# Patient Record
Sex: Male | Born: 1950 | Race: Black or African American | Hispanic: No | Marital: Single | Smoking: Former smoker
Health system: Southern US, Community
[De-identification: ages and names within clinical notes are randomized; demographics above are authoritative.]

## PROBLEM LIST (undated history)

## (undated) ENCOUNTER — Emergency Department (HOSPITAL_COMMUNITY): Admission: EM | Disposition: A | Discharge status: Home / Self Care | Payer: Self-pay

## (undated) DIAGNOSIS — E785 Hyperlipidemia, unspecified: Secondary | ICD-10-CM

## (undated) DIAGNOSIS — K922 Gastrointestinal hemorrhage, unspecified: Secondary | ICD-10-CM

## (undated) DIAGNOSIS — N186 End stage renal disease: Secondary | ICD-10-CM

## (undated) DIAGNOSIS — E119 Type 2 diabetes mellitus without complications: Secondary | ICD-10-CM

## (undated) DIAGNOSIS — J90 Pleural effusion, not elsewhere classified: Secondary | ICD-10-CM

## (undated) DIAGNOSIS — I4891 Unspecified atrial fibrillation: Secondary | ICD-10-CM

## (undated) DIAGNOSIS — I5042 Chronic combined systolic (congestive) and diastolic (congestive) heart failure: Secondary | ICD-10-CM

## (undated) DIAGNOSIS — G9340 Encephalopathy, unspecified: Secondary | ICD-10-CM

## (undated) DIAGNOSIS — I639 Cerebral infarction, unspecified: Secondary | ICD-10-CM

## (undated) DIAGNOSIS — F0390 Unspecified dementia without behavioral disturbance: Secondary | ICD-10-CM

## (undated) DIAGNOSIS — I739 Peripheral vascular disease, unspecified: Secondary | ICD-10-CM

## (undated) DIAGNOSIS — I10 Essential (primary) hypertension: Secondary | ICD-10-CM

## (undated) HISTORY — PX: BELOW KNEE LEG AMPUTATION: SUR23

---

## 2016-09-04 ENCOUNTER — Emergency Department (HOSPITAL_COMMUNITY): Payer: Medicare Other

## 2016-09-04 ENCOUNTER — Inpatient Hospital Stay (HOSPITAL_COMMUNITY): Payer: Medicare Other

## 2016-09-04 ENCOUNTER — Inpatient Hospital Stay (HOSPITAL_COMMUNITY)
Admission: EM | Admit: 2016-09-04 | Discharge: 2016-09-13 | DRG: 377 | Disposition: A | Discharge status: Skilled Nursing Facility | Payer: Medicare Other | Attending: Internal Medicine | Admitting: Internal Medicine

## 2016-09-04 ENCOUNTER — Encounter (HOSPITAL_COMMUNITY): Payer: Self-pay | Admitting: *Deleted

## 2016-09-04 DIAGNOSIS — K5730 Diverticulosis of large intestine without perforation or abscess without bleeding: Secondary | ICD-10-CM | POA: Diagnosis not present

## 2016-09-04 DIAGNOSIS — L97919 Non-pressure chronic ulcer of unspecified part of right lower leg with unspecified severity: Secondary | ICD-10-CM | POA: Diagnosis present

## 2016-09-04 DIAGNOSIS — I214 Non-ST elevation (NSTEMI) myocardial infarction: Secondary | ICD-10-CM | POA: Diagnosis present

## 2016-09-04 DIAGNOSIS — Z89512 Acquired absence of left leg below knee: Secondary | ICD-10-CM | POA: Diagnosis not present

## 2016-09-04 DIAGNOSIS — H47612 Cortical blindness, left side of brain: Secondary | ICD-10-CM | POA: Diagnosis present

## 2016-09-04 DIAGNOSIS — K7460 Unspecified cirrhosis of liver: Secondary | ICD-10-CM | POA: Diagnosis not present

## 2016-09-04 DIAGNOSIS — Z4659 Encounter for fitting and adjustment of other gastrointestinal appliance and device: Secondary | ICD-10-CM | POA: Diagnosis not present

## 2016-09-04 DIAGNOSIS — H547 Unspecified visual loss: Secondary | ICD-10-CM | POA: Diagnosis not present

## 2016-09-04 DIAGNOSIS — K921 Melena: Secondary | ICD-10-CM | POA: Diagnosis not present

## 2016-09-04 DIAGNOSIS — E11622 Type 2 diabetes mellitus with other skin ulcer: Secondary | ICD-10-CM | POA: Diagnosis present

## 2016-09-04 DIAGNOSIS — J9690 Respiratory failure, unspecified, unspecified whether with hypoxia or hypercapnia: Secondary | ICD-10-CM | POA: Diagnosis present

## 2016-09-04 DIAGNOSIS — J90 Pleural effusion, not elsewhere classified: Secondary | ICD-10-CM | POA: Diagnosis not present

## 2016-09-04 DIAGNOSIS — R578 Other shock: Secondary | ICD-10-CM | POA: Diagnosis present

## 2016-09-04 DIAGNOSIS — D329 Benign neoplasm of meninges, unspecified: Secondary | ICD-10-CM | POA: Diagnosis not present

## 2016-09-04 DIAGNOSIS — D62 Acute posthemorrhagic anemia: Secondary | ICD-10-CM | POA: Diagnosis present

## 2016-09-04 DIAGNOSIS — Z79899 Other long term (current) drug therapy: Secondary | ICD-10-CM

## 2016-09-04 DIAGNOSIS — Z794 Long term (current) use of insulin: Secondary | ICD-10-CM

## 2016-09-04 DIAGNOSIS — K7469 Other cirrhosis of liver: Secondary | ICD-10-CM | POA: Diagnosis not present

## 2016-09-04 DIAGNOSIS — E1122 Type 2 diabetes mellitus with diabetic chronic kidney disease: Secondary | ICD-10-CM | POA: Diagnosis present

## 2016-09-04 DIAGNOSIS — I5042 Chronic combined systolic (congestive) and diastolic (congestive) heart failure: Secondary | ICD-10-CM | POA: Diagnosis present

## 2016-09-04 DIAGNOSIS — I4891 Unspecified atrial fibrillation: Secondary | ICD-10-CM | POA: Diagnosis not present

## 2016-09-04 DIAGNOSIS — J811 Chronic pulmonary edema: Secondary | ICD-10-CM

## 2016-09-04 DIAGNOSIS — I83009 Varicose veins of unspecified lower extremity with ulcer of unspecified site: Secondary | ICD-10-CM | POA: Diagnosis present

## 2016-09-04 DIAGNOSIS — F0150 Vascular dementia without behavioral disturbance: Secondary | ICD-10-CM | POA: Diagnosis present

## 2016-09-04 DIAGNOSIS — I132 Hypertensive heart and chronic kidney disease with heart failure and with stage 5 chronic kidney disease, or end stage renal disease: Secondary | ICD-10-CM | POA: Diagnosis present

## 2016-09-04 DIAGNOSIS — E875 Hyperkalemia: Secondary | ICD-10-CM | POA: Diagnosis present

## 2016-09-04 DIAGNOSIS — N186 End stage renal disease: Secondary | ICD-10-CM | POA: Diagnosis present

## 2016-09-04 DIAGNOSIS — I5022 Chronic systolic (congestive) heart failure: Secondary | ICD-10-CM | POA: Diagnosis not present

## 2016-09-04 DIAGNOSIS — L8992 Pressure ulcer of unspecified site, stage 2: Secondary | ICD-10-CM | POA: Diagnosis present

## 2016-09-04 DIAGNOSIS — I639 Cerebral infarction, unspecified: Secondary | ICD-10-CM

## 2016-09-04 DIAGNOSIS — M898X9 Other specified disorders of bone, unspecified site: Secondary | ICD-10-CM | POA: Diagnosis present

## 2016-09-04 DIAGNOSIS — I482 Chronic atrial fibrillation: Secondary | ICD-10-CM | POA: Diagnosis present

## 2016-09-04 DIAGNOSIS — Z992 Dependence on renal dialysis: Secondary | ICD-10-CM | POA: Diagnosis not present

## 2016-09-04 DIAGNOSIS — Z6823 Body mass index (BMI) 23.0-23.9, adult: Secondary | ICD-10-CM | POA: Diagnosis not present

## 2016-09-04 DIAGNOSIS — Z7901 Long term (current) use of anticoagulants: Secondary | ICD-10-CM

## 2016-09-04 DIAGNOSIS — K5791 Diverticulosis of intestine, part unspecified, without perforation or abscess with bleeding: Secondary | ICD-10-CM | POA: Diagnosis present

## 2016-09-04 DIAGNOSIS — K922 Gastrointestinal hemorrhage, unspecified: Secondary | ICD-10-CM | POA: Diagnosis present

## 2016-09-04 DIAGNOSIS — R710 Precipitous drop in hematocrit: Secondary | ICD-10-CM | POA: Diagnosis not present

## 2016-09-04 DIAGNOSIS — K625 Hemorrhage of anus and rectum: Secondary | ICD-10-CM | POA: Diagnosis not present

## 2016-09-04 DIAGNOSIS — H47611 Cortical blindness, right side of brain: Secondary | ICD-10-CM | POA: Diagnosis present

## 2016-09-04 DIAGNOSIS — E1151 Type 2 diabetes mellitus with diabetic peripheral angiopathy without gangrene: Secondary | ICD-10-CM | POA: Diagnosis present

## 2016-09-04 DIAGNOSIS — G9340 Encephalopathy, unspecified: Secondary | ICD-10-CM | POA: Diagnosis present

## 2016-09-04 DIAGNOSIS — I259 Chronic ischemic heart disease, unspecified: Secondary | ICD-10-CM | POA: Diagnosis present

## 2016-09-04 DIAGNOSIS — I340 Nonrheumatic mitral (valve) insufficiency: Secondary | ICD-10-CM | POA: Diagnosis not present

## 2016-09-04 DIAGNOSIS — Z7982 Long term (current) use of aspirin: Secondary | ICD-10-CM

## 2016-09-04 DIAGNOSIS — Z09 Encounter for follow-up examination after completed treatment for conditions other than malignant neoplasm: Secondary | ICD-10-CM

## 2016-09-04 DIAGNOSIS — E440 Moderate protein-calorie malnutrition: Secondary | ICD-10-CM | POA: Insufficient documentation

## 2016-09-04 DIAGNOSIS — E1165 Type 2 diabetes mellitus with hyperglycemia: Secondary | ICD-10-CM | POA: Diagnosis present

## 2016-09-04 DIAGNOSIS — D631 Anemia in chronic kidney disease: Secondary | ICD-10-CM | POA: Diagnosis present

## 2016-09-04 DIAGNOSIS — I63119 Cerebral infarction due to embolism of unspecified vertebral artery: Secondary | ICD-10-CM | POA: Diagnosis not present

## 2016-09-04 DIAGNOSIS — D696 Thrombocytopenia, unspecified: Secondary | ICD-10-CM | POA: Diagnosis present

## 2016-09-04 DIAGNOSIS — I248 Other forms of acute ischemic heart disease: Secondary | ICD-10-CM | POA: Diagnosis not present

## 2016-09-04 HISTORY — DX: Pleural effusion, not elsewhere classified: J90

## 2016-09-04 HISTORY — DX: Gastrointestinal hemorrhage, unspecified: K92.2

## 2016-09-04 HISTORY — DX: End stage renal disease: N18.6

## 2016-09-04 HISTORY — DX: Unspecified dementia, unspecified severity, without behavioral disturbance, psychotic disturbance, mood disturbance, and anxiety: F03.90

## 2016-09-04 HISTORY — DX: Cerebral infarction, unspecified: I63.9

## 2016-09-04 HISTORY — DX: Type 2 diabetes mellitus without complications: E11.9

## 2016-09-04 HISTORY — DX: Chronic combined systolic (congestive) and diastolic (congestive) heart failure: I50.42

## 2016-09-04 HISTORY — DX: Encephalopathy, unspecified: G93.40

## 2016-09-04 HISTORY — DX: Essential (primary) hypertension: I10

## 2016-09-04 HISTORY — DX: Peripheral vascular disease, unspecified: I73.9

## 2016-09-04 HISTORY — DX: Unspecified atrial fibrillation: I48.91

## 2016-09-04 LAB — I-STAT TROPONIN, ED: TROPONIN I, POC: 0.04 ng/mL (ref 0.00–0.08)

## 2016-09-04 LAB — CBC
HCT: 22 % — ABNORMAL LOW (ref 39.0–52.0)
HEMATOCRIT: 20.2 % — AB (ref 39.0–52.0)
HEMATOCRIT: 20.9 % — AB (ref 39.0–52.0)
HEMOGLOBIN: 7.2 g/dL — AB (ref 13.0–17.0)
HEMOGLOBIN: 7.5 g/dL — AB (ref 13.0–17.0)
Hemoglobin: 7.4 g/dL — ABNORMAL LOW (ref 13.0–17.0)
MCH: 29.1 pg (ref 26.0–34.0)
MCH: 30.3 pg (ref 26.0–34.0)
MCH: 30.2 pg (ref 26.0–34.0)
MCHC: 33.6 g/dL (ref 30.0–36.0)
MCHC: 35.6 g/dL (ref 30.0–36.0)
MCHC: 35.9 g/dL (ref 30.0–36.0)
MCV: 86.6 fL (ref 78.0–100.0)
MCV: 84.9 fL (ref 78.0–100.0)
MCV: 84.3 fL (ref 78.0–100.0)
PLATELETS: 155 10*3/uL (ref 150–400)
PLATELETS: 120 10*3/uL — AB (ref 150–400)
Platelets: 146 10*3/uL — ABNORMAL LOW (ref 150–400)
RBC: 2.54 MIL/uL — AB (ref 4.22–5.81)
RBC: 2.38 MIL/uL — AB (ref 4.22–5.81)
RBC: 2.48 MIL/uL — AB (ref 4.22–5.81)
RDW: 15.4 % (ref 11.5–15.5)
RDW: 15.9 % — ABNORMAL HIGH (ref 11.5–15.5)
RDW: 15.3 % (ref 11.5–15.5)
WBC: 20.4 10*3/uL — AB (ref 4.0–10.5)
WBC: 27.2 10*3/uL — AB (ref 4.0–10.5)
WBC: 18.9 10*3/uL — AB (ref 4.0–10.5)

## 2016-09-04 LAB — CBC WITH DIFFERENTIAL/PLATELET
BASOS PCT: 0 %
Basophils Absolute: 0 10*3/uL (ref 0.0–0.1)
EOS ABS: 0.4 10*3/uL (ref 0.0–0.7)
EOS PCT: 4 %
HCT: 18.7 % — ABNORMAL LOW (ref 39.0–52.0)
Hemoglobin: 6.3 g/dL — CL (ref 13.0–17.0)
LYMPHS ABS: 1.7 10*3/uL (ref 0.7–4.0)
Lymphocytes Relative: 14 %
MCH: 29.6 pg (ref 26.0–34.0)
MCHC: 33.7 g/dL (ref 30.0–36.0)
MCV: 87.8 fL (ref 78.0–100.0)
Monocytes Absolute: 0.8 10*3/uL (ref 0.1–1.0)
Monocytes Relative: 7 %
NEUTROS PCT: 76 %
Neutro Abs: 9 10*3/uL — ABNORMAL HIGH (ref 1.7–7.7)
PLATELETS: 210 10*3/uL (ref 150–400)
RBC: 2.13 MIL/uL — AB (ref 4.22–5.81)
RDW: 16.2 % — ABNORMAL HIGH (ref 11.5–15.5)
WBC: 11.8 10*3/uL — AB (ref 4.0–10.5)

## 2016-09-04 LAB — COMPREHENSIVE METABOLIC PANEL
ALBUMIN: 2.5 g/dL — AB (ref 3.5–5.0)
ALT: 18 U/L (ref 17–63)
AST: 22 U/L (ref 15–41)
Alkaline Phosphatase: 66 U/L (ref 38–126)
Anion gap: 10 (ref 5–15)
BUN: 28 mg/dL — ABNORMAL HIGH (ref 6–20)
CO2: 23 mmol/L (ref 22–32)
Calcium: 7.7 mg/dL — ABNORMAL LOW (ref 8.9–10.3)
Chloride: 105 mmol/L (ref 101–111)
Creatinine, Ser: 2.99 mg/dL — ABNORMAL HIGH (ref 0.61–1.24)
GFR calc non Af Amer: 20 mL/min — ABNORMAL LOW (ref >60)
GFR, EST AFRICAN AMERICAN: 24 mL/min — AB (ref >60)
GLUCOSE: 185 mg/dL — AB (ref 65–99)
Potassium: 3.9 mmol/L (ref 3.5–5.1)
SODIUM: 138 mmol/L (ref 135–145)
Total Bilirubin: 0.4 mg/dL (ref 0.3–1.2)
Total Protein: 4.7 g/dL — ABNORMAL LOW (ref 6.5–8.1)

## 2016-09-04 LAB — TROPONIN I
TROPONIN I: 0.04 ng/mL — AB (ref <0.03)
TROPONIN I: 0.65 ng/mL — AB (ref <0.03)
Troponin I: 0.04 ng/mL (ref <0.03)
Troponin I: 0.04 ng/mL (ref <0.03)

## 2016-09-04 LAB — BLOOD GAS, ARTERIAL
ACID-BASE DEFICIT: 4.9 mmol/L — AB (ref 0.0–2.0)
BICARBONATE: 19 mmol/L — AB (ref 20.0–28.0)
Drawn by: 511471
FIO2: 0.21
O2 SAT: 97.1 %
PATIENT TEMPERATURE: 97.9
PCO2 ART: 31.8 mmHg — AB (ref 32.0–48.0)
PO2 ART: 86 mmHg (ref 83.0–108.0)
pH, Arterial: 7.392 (ref 7.350–7.450)

## 2016-09-04 LAB — BASIC METABOLIC PANEL
ANION GAP: 10 (ref 5–15)
BUN: 28 mg/dL — ABNORMAL HIGH (ref 6–20)
CHLORIDE: 109 mmol/L (ref 101–111)
CO2: 20 mmol/L — AB (ref 22–32)
Calcium: 7.2 mg/dL — ABNORMAL LOW (ref 8.9–10.3)
Creatinine, Ser: 2.96 mg/dL — ABNORMAL HIGH (ref 0.61–1.24)
GFR calc non Af Amer: 21 mL/min — ABNORMAL LOW (ref >60)
GFR, EST AFRICAN AMERICAN: 24 mL/min — AB (ref >60)
GLUCOSE: 183 mg/dL — AB (ref 65–99)
Potassium: 4.1 mmol/L (ref 3.5–5.1)
Sodium: 139 mmol/L (ref 135–145)

## 2016-09-04 LAB — I-STAT CG4 LACTIC ACID, ED: LACTIC ACID, VENOUS: 2.38 mmol/L — AB (ref 0.5–1.9)

## 2016-09-04 LAB — PROTIME-INR
INR: 1.61
INR: 1.03
Prothrombin Time: 19.3 seconds — ABNORMAL HIGH (ref 11.4–15.2)
Prothrombin Time: 13.5 seconds (ref 11.4–15.2)

## 2016-09-04 LAB — ABO/RH: ABO/RH(D): B POS

## 2016-09-04 LAB — CBG MONITORING, ED: GLUCOSE-CAPILLARY: 148 mg/dL — AB (ref 65–99)

## 2016-09-04 LAB — GLUCOSE, CAPILLARY
GLUCOSE-CAPILLARY: 145 mg/dL — AB (ref 65–99)
GLUCOSE-CAPILLARY: 105 mg/dL — AB (ref 65–99)
GLUCOSE-CAPILLARY: 114 mg/dL — AB (ref 65–99)
Glucose-Capillary: 126 mg/dL — ABNORMAL HIGH (ref 65–99)

## 2016-09-04 LAB — MRSA PCR SCREENING: MRSA by PCR: NEGATIVE

## 2016-09-04 LAB — PREPARE RBC (CROSSMATCH)

## 2016-09-04 LAB — MAGNESIUM: Magnesium: 1.5 mg/dL — ABNORMAL LOW (ref 1.7–2.4)

## 2016-09-04 LAB — PHOSPHORUS: PHOSPHORUS: 3 mg/dL (ref 2.5–4.6)

## 2016-09-04 MED ORDER — INSULIN ASPART 100 UNIT/ML ~~LOC~~ SOLN
0.0000 [IU] | SUBCUTANEOUS | Status: DC
  Administered 2016-09-04 – 2016-09-08 (×4): 1 [IU] via SUBCUTANEOUS

## 2016-09-04 MED ORDER — PHENYLEPHRINE HCL-NACL 10-0.9 MG/250ML-% IV SOLN
0.0000 ug/min | INTRAVENOUS | Status: DC
  Administered 2016-09-04: 50 ug/min via INTRAVENOUS
  Administered 2016-09-04: 15 ug/min via INTRAVENOUS
  Administered 2016-09-04: 30 ug/min via INTRAVENOUS
  Filled 2016-09-04 (×3): qty 250

## 2016-09-04 MED ORDER — PROTHROMBIN COMPLEX CONC HUMAN 500 UNITS IV KIT
3937.0000 [IU] | PACK | INTRAVENOUS | Status: AC
  Administered 2016-09-04: 3937 [IU] via INTRAVENOUS
  Filled 2016-09-04: qty 157.48

## 2016-09-04 MED ORDER — SODIUM CHLORIDE 0.9 % IV BOLUS (SEPSIS)
500.0000 mL | Freq: Once | INTRAVENOUS | Status: AC
  Administered 2016-09-04: 500 mL via INTRAVENOUS

## 2016-09-04 MED ORDER — SODIUM PERTECHNETATE TC 99M INJECTION: 23.8000 | Freq: Once | INTRAVENOUS | Status: DC | PRN

## 2016-09-04 MED ORDER — IOPAMIDOL (ISOVUE-370) INJECTION 76%
100.0000 mL | Freq: Once | INTRAVENOUS | Status: AC | PRN
  Administered 2016-09-04: 100 mL via INTRAVENOUS

## 2016-09-04 MED ORDER — HEPARIN SOD (PORK) LOCK FLUSH 100 UNIT/ML IV SOLN
500.0000 [IU] | Freq: Once | INTRAVENOUS | Status: DC
  Filled 2016-09-04: qty 5

## 2016-09-04 MED ORDER — SODIUM CHLORIDE 0.9 % IV SOLN
8.0000 mg/h | INTRAVENOUS | Status: DC
  Administered 2016-09-04 – 2016-09-05 (×3): 8 mg/h via INTRAVENOUS
  Filled 2016-09-04 (×5): qty 80

## 2016-09-04 MED ORDER — SODIUM CHLORIDE 0.9 % IV SOLN: Freq: Once | INTRAVENOUS | Status: DC

## 2016-09-04 MED ORDER — SODIUM CHLORIDE 0.9 % IV SOLN
0.4000 ug/kg | Freq: Once | INTRAVENOUS | Status: AC
  Administered 2016-09-04: 29.2 ug via INTRAVENOUS
  Filled 2016-09-04: qty 7.3

## 2016-09-04 MED ORDER — FEIBA NF IV SOLR
25.0000 [IU]/kg | Status: AC
  Administered 2016-09-04: 1833 [IU] via INTRAVENOUS
  Filled 2016-09-04: qty 1833

## 2016-09-04 MED ORDER — PANTOPRAZOLE SODIUM 40 MG IV SOLR: 40.0000 mg | Freq: Two times a day (BID) | INTRAVENOUS | Status: DC

## 2016-09-04 MED ORDER — SODIUM CHLORIDE 0.9 % IV SOLN
80.0000 mg | Freq: Once | INTRAVENOUS | Status: AC
  Administered 2016-09-04: 09:00:00 80 mg via INTRAVENOUS
  Filled 2016-09-04: qty 80

## 2016-09-04 MED ORDER — SODIUM CHLORIDE 0.9 % IV SOLN
Freq: Once | INTRAVENOUS | Status: AC
  Administered 2016-09-04: 15:00:00 via INTRAVENOUS

## 2016-09-04 MED ORDER — SODIUM PERTECHNETATE TC 99M INJECTION
22.0000 | Freq: Once | INTRAVENOUS | Status: AC | PRN
  Administered 2016-09-04: 22 via INTRAVENOUS

## 2016-09-04 NOTE — Progress Notes (Signed)
/  [place] Progress Note Patient Name: Deakin Lacek DOB: 07-Nov-1950 MRN: [id number]   Date of Service  09/04/2016  HPI/Events of Note  BG on room air = 7.39/31.8/86.0  eICU Interventions  Continue present management.      Intervention Category Major Interventions: Change in mental status - evaluation and management  Sommer,Steven Eugene 09/04/2016, 9:51 PM

## 2016-09-04 NOTE — Consult Note (Signed)
Chief Complaint: Patient was seen in consultation today for GI bleed  Referring Physician(s):  Dr. Owens Loffler  Supervising Physician: Jacqulynn Cadet  Patient Status: [facility] - In-pt  History of Present Illness: George Burgess is a 66 y.o. male with past medical history of chronic a fib, prior CVA, vascular dementa, HTN, DM2, PVD s/p left BKA, chronic LE wounds, and ESRD on HD who presented to [facility] ED after passing dark red stools at nursing home.  Patient was found to have hypotension and HgB of 6.3, now s/p 3u PRBCs.  He has continued with hypotension and only had a 1 point increase in hemoglobin after transfusions.   IR consulted for possible angiogram and embolization.  Patient undergoing CTA Abdomen.   Dr. Laurence Ferrari aware of case.   No past medical history on file.  No past surgical history on file.  Allergies: Patient has no known allergies.  Medications: Prior to Admission medications   Medication Sig Start Date End Date Taking? Authorizing Provider  acetaminophen (TYLENOL) 325 MG tablet Take 650 mg by mouth every 4 (four) hours as needed for moderate pain.   Yes [provider]  apixaban (ELIQUIS) 2.5 MG TABS tablet Take 2.5 mg by mouth 2 (two) times daily.   Yes [provider]  ascorbic acid (VITAMIN C) 500 MG tablet Take 500 mg by mouth daily.   Yes [provider]  aspirin 81 MG chewable tablet Chew 81 mg by mouth daily.   Yes [provider]  famotidine (PEPCID) 20 MG tablet Take 20 mg by mouth daily.   Yes [provider]  ferrous sulfate 325 (65 FE) MG tablet Take 325 mg by mouth 2 (two) times daily with a meal.   Yes [provider]  furosemide (LASIX) 40 MG tablet Take 40 mg by mouth daily.   Yes [provider]  insulin lispro (HUMALOG) 100 UNIT/ML injection Inject 0-10 Units into the skin 3 (three) times daily before meals. 70-150 units=0 units,  151-200=200 units,  201-250=4 units,  251-300=6  units, 301-350= 8 units, 351 += 10 units, 400 or higher CALL MD   Yes [provider]  isosorbide mononitrate (IMDUR) 30 MG 24 hr tablet Take 30 mg by mouth daily.   Yes [provider]  lisinopril (PRINIVIL,ZESTRIL) 10 MG tablet Take 10 mg by mouth daily.   Yes [provider]  metoprolol tartrate (LOPRESSOR) 50 MG tablet Take 50 mg by mouth 2 (two) times daily.   Yes [provider]  nitroGLYCERIN (NITROSTAT) 0.4 MG SL tablet Place 0.4 mg under the tongue every 5 (five) minutes as needed for chest pain.   Yes [provider]  Nutritional Supplements (FEEDING SUPPLEMENT, NEPRO CARB STEADY,) LIQD Take 237 mLs by mouth daily.   Yes [provider]  pantoprazole (PROTONIX) 40 MG tablet Take 40 mg by mouth daily.   Yes [provider]     No family history on file.  Social History   Social History  . Marital status: Single    Spouse name: N/A  . Number of children: N/A  . Years of education: N/A   Social History Main Topics  . Smoking status: Not on file  . Smokeless tobacco: Not on file  . Alcohol use Not on file  . Drug use: Unknown  . Sexual activity: Not on file   Other Topics Concern  . Not on file   Social History Narrative  . No narrative on file  Review of Systems  Unable to perform ROS: Dementia    Vital Signs: BP (!) 84/39   Pulse 63   Temp (!) 96.5 F (35.8 C) (Rectal)   Resp (!) 26   Ht 6' (1.829 m)   Wt 161 lb 9.6 oz (73.3 kg)   SpO2 99%   BMI 21.92 kg/m   Physical Exam  Constitutional: He appears well-developed.  Cardiovascular: Normal rate, regular rhythm and normal heart sounds.   Pulmonary/Chest: Effort normal. No respiratory distress.  Coarse breath sounds throughout  Abdominal: Soft.  Continuous dark red stools  Neurological: He is alert.  Skin: Skin is dry.  Under warming blankets  Nursing note and vitals reviewed.   Mallampati Score:  MD Evaluation Airway: WNL Heart:  WNL Abdomen: WNL Chest/ Lungs: WNL ASA  Classification: 3 Mallampati/Airway Score: Two  Imaging: Dg Chest Port 1 View  Result Date: 09/04/2016 CLINICAL DATA:  GI bleed.  Possible sepsis . EXAM: PORTABLE CHEST 1 VIEW COMPARISON:  No recent prior . FINDINGS: Right IJ dual-lumen catheter with tip projected over the right atrium. Cardiomegaly with bilateral pulmonary infiltrates consistent with pulmonary edema, right side greater than. Small right pleural effusion. IMPRESSION: 1. Right IJ dual-lumen catheter with tip projected over right atrium. 2. Congestive heart failure with pulmonary edema, right side greater than left. Basilar pneumonia cannot be excluded. Right-sided pleural effusion. Electronically Signed   By: Marcello Moores  Register   On: 09/04/2016 07:32    Labs:  CBC:  Recent Labs  09/04/16 0627 09/04/16 0825  WBC 11.8* 20.4*  HGB 6.3* 7.4*  HCT 18.7* 22.0*  PLT 210 155    COAGS:  Recent Labs  09/04/16 0627  INR 1.61    BMP:  Recent Labs  09/04/16 0627 09/04/16 1127  NA 138 139  K 3.9 4.1  CL 105 109  CO2 23 20*  GLUCOSE 185* 183*  BUN 28* 28*  CALCIUM 7.7* 7.2*  CREATININE 2.99* 2.96*  GFRNONAA 20* 21*  GFRAA 24* 24*    LIVER FUNCTION TESTS:  Recent Labs  09/04/16 0627  BILITOT 0.4  AST 22  ALT 18  ALKPHOS 66  PROT 4.7*  ALBUMIN 2.5*    TUMOR MARKERS: No results for input(s): AFPTM, CEA, CA199, CHROMGRNA in the last 8760 hours.  Assessment and Plan: GI Bleed Patient with history of afib and CVA on Elliquis at home presented with dark red stools and hypotension this AM.  NG lavage this morning by GI was negative. SCr 2.99; on HD.  Currently on pressors.  Discussed case with Dr. Laurence Ferrari.  Will follow results of CTA and continue to work with care team regarding best interventions.  Last dose of Elliquis was yesterday.  IR to follow.  Thank you for this interesting consult.  I greatly enjoyed meeting George Burgess and look forward to  participating in their care.  A copy of this report was sent to the requesting provider on this date.  Electronically Signed: Docia Barrier, PA 09/04/2016, 2:19 PM   I spent a total of 40 Minutes    in face to face in clinical consultation, greater than 50% of which was counseling/coordinating care for GI bleed

## 2016-09-04 NOTE — H&P (Signed)
PULMONARY / CRITICAL CARE MEDICINE   Name: George Burgess MRN: [id number] DOB: [birth year]    ADMISSION DATE:  09/04/2016 CONSULTATION DATE:  7/18   REFERRING MD: Jeneen Rinks  CHIEF COMPLAINT:  Hemorrhagic shock   HISTORY OF PRESENT ILLNESS:   This is a 66 year old male w/ sig h/o: CAF (on DOAC), HFrEF (EF 30%), prior CVA, vascular dementia, HTN, diabetes type II,  Bilateral Pleural effusions, PVD, prior Left BKA, chronic LE wounds. Resides at SNF (has no HCPOA). Presented from SNF 6/18 after having 2 large Volume maroon colored stools and BP in 70s. On arrival to ED he was awake. Remained hypotensive w/ SBP in 70s but would increase w/ fluid and blood resuscitation. His initial Hgb was 6.3. He was given K centra, received 2 units of blood In ED but remained hypotensive. Because of this PCCM asked to admit.    PAST MEDICAL HISTORY :  CAF, HFrEF (EF 30%), prior CVA, vascular dementia, HTN, diabetes type II,  Bilateral Pleural effusions, PVD, prior Left BKA, chronic LE wounds.   PAST SURGICAL HISTORY: Left BKA   No Known Allergies  No current facility-administered medications on file prior to encounter.    No current outpatient prescriptions on file prior to encounter.    FAMILY HISTORY:  His has no family status information on file.    SOCIAL HISTORY: He  resides at a SNF No legal guardians on file   REVIEW OF SYSTEMS:   Not able   SUBJECTIVE:  Hypotensive Not in distress.   VITAL SIGNS: BP (!) 79/58   Pulse 88   Temp (!) 97.5 F (36.4 C)   Resp (!) 21   Ht 6' (1.829 m)   Wt 180 lb (81.6 kg)   SpO2 98%   BMI 24.41 kg/m   HEMODYNAMICS:    VENTILATOR SETTINGS:    INTAKE / OUTPUT: No intake/output data recorded.  PHYSICAL EXAMINATION: General appearance:  66 Year old  Male, chronically ill appearing, currently not in acute distress, confused (poor historian at baseline),  conversant  Eyes: anicteric sclerae , moist conjunctivae; PERRL, EOMI bilaterally. Mouth:   membranes and no mucosal ulcerations; normal hard and soft palate Neck: Trachea midline; neck supple, no JVD Lungs/chest: CTA, with normal respiratory effort and no intercostal retractions, perhaps a little decreased in bases.  CV: RRR, no MRGs  Abdomen: Soft, non-tender; no masses, having frequent maroon colored liquid stools w/ clotts Extremities: prior Left BKA. He has dressings on his Right lower lateral extremity. Also small dressing on end of BKA.  Skin: Normal temperature, turgor his skin is dry and scaley . Lower extremies appear to have chronic venous and arterial disease.  Psych:  alert and oriented to person, place can't tell me why he is here. Or any further hx.  LABS:  BMET  Recent Labs Lab 09/04/16 0627  NA 138  K 3.9  CL 105  CO2 23  BUN 28*  CREATININE 2.99*  GLUCOSE 185*    Electrolytes  Recent Labs Lab 09/04/16 0627  CALCIUM 7.7*    CBC  Recent Labs Lab 09/04/16 0627  WBC 11.8*  HGB 6.3*  HCT 18.7*  PLT 210    Coag's  Recent Labs Lab 09/04/16 0627  INR 1.61    Sepsis Markers  Recent Labs Lab 09/04/16 0716  LATICACIDVEN 2.38*    ABG No results for input(s): PHART, PCO2ART, PO2ART in the last 168 hours.  Liver Enzymes  Recent Labs Lab 09/04/16 0627  AST 22  ALT 18  ALKPHOS 66  BILITOT 0.4  ALBUMIN 2.5*    Cardiac Enzymes No results for input(s): TROPONINI, PROBNP in the last 168 hours.  Glucose  Recent Labs Lab 09/04/16 0613  GLUCAP 148*    Imaging Dg Chest Port 1 View  Result Date: 09/04/2016 CLINICAL DATA:  GI bleed.  Possible sepsis . EXAM: PORTABLE CHEST 1 VIEW COMPARISON:  No recent prior . FINDINGS: Right IJ dual-lumen catheter with tip projected over the right atrium. Cardiomegaly with bilateral pulmonary infiltrates consistent with pulmonary edema, right side greater than. Small right pleural effusion. IMPRESSION: 1. Right IJ dual-lumen catheter with tip projected over right atrium. 2. Congestive heart  failure with pulmonary edema, right side greater than left. Basilar pneumonia cannot be excluded. Right-sided pleural effusion. Electronically Signed   By: Marcello Moores  Register   On: 09/04/2016 07:32     STUDIES:    CULTURES:   ANTIBIOTICS:   SIGNIFICANT EVENTS:   LINES/TUBES:   DISCUSSION: 66 year old male w/ h/o CVA and vascular dementia. Reported to be "poor historian" and has no HCPOA. Other sig hx: AF on DOAC, HFrEF (EF 30%), HTN, ESRD TTS. Now here w/ what appears to be Acute GIB.  S/p PRBCs and Kcentra in ED.  -transfuse as needed -GI consult -PPI infusion -renal consult -will have GI see here & continue resuscitation efforts. If remains hypotensive may require CRRT but currently no indication for HD.  -if stabilizes will go to [facility]     ASSESSMENT / PLAN:   Hemorrhagic Shock in setting of what appears to be acute GIB (not clear if upper or lower at this point) on DOAC -now s/p Kcentra and 2 units of PRBC in ER. Awaiting # 3. He appears to be volume responsive thus far.  Plan Keep 2 units ahead Serial CBC and coags GI called Transfuse as indicated. (ESRD may be a issue as resuscitation efforts continue. Currently on room air w/out dyspnea or hypoxia) Start PPI bolus and  infusion  H/o afib and HFrEF (EF 30%) Plan Rate control as indicated.  Tele Holding metoprolol w/ hypotension No more DOAC in setting of life threatening GIB Holding lasix, lisinopril and Imdur  ESRD (TTS) At risk for fluid and electrolyte imbalance  No current need for HD. Can cont resuscitation efforts here. Will have GI see him first. If remains hypotensive will need CRRT .  Plan Contact renal May need CRRT w/ hypotension  Serial chemistry If we can get hemodynamics stabilized he will transfer [facility]   H/o right > right effusion felt d/t ESRD.  -never evaluated given no ability to get consent. Was asymptomatic  Plan Repeat CXR   H/o diabetes type II w/ hyperglycemia   Plan:   Sensitive scale insulin   Acute encephalopathy H/o CVA w/ vascular dementia -not clear what his baseline is. States from dc summary from [place] he is a "poor historian" Plan:   Supportive care Holding narcotics and sedating meds   Severe PVD. W/ venous ulcerations on RLE and  Prior left BKA Plan Ask WON to eval Cont supportive care  My cct 60 min  Erick Colace ACNP-BC [place] Pager # [phone] OR # [phone] if no answer   09/04/2016, 8:32 AM

## 2016-09-04 NOTE — ED Provider Notes (Signed)
[place] DEPT Provider Note   CSN: [id number] Arrival date & time: 09/04/16  0610     History   Chief Complaint Chief Complaint  Patient presents with  . GI Bleeding    HPI George Burgess is a 66 y.o. male. Chief complaint is GI bleeding, hypotension  HPI 74 show male. Presents via EMS from [facility], care facility. History of encephalopathy and chronic renal failure on 3 times a week maintenance hemodialysis. He is on glucose 2.5 mg per day. Per his chart "to prevent DVT".  Morning noted to have 2 large volume episodes of bright red blood per rectum and had her pressure 70 at the facility and was transferred here.  He was awake and alert. Minimally able to dissipate and history with answers only simple questions.  No past medical history on file.  There are no active problems to display for this patient.   No past surgical history on file.     Home Medications    Prior to Admission medications   Medication Sig Start Date End Date Taking? Authorizing Provider  acetaminophen (TYLENOL) 325 MG tablet Take 650 mg by mouth every 4 (four) hours as needed for moderate pain.   Yes [provider]  apixaban (ELIQUIS) 2.5 MG TABS tablet Take 2.5 mg by mouth 2 (two) times daily.   Yes [provider]  ascorbic acid (VITAMIN C) 500 MG tablet Take 500 mg by mouth daily.   Yes [provider]  aspirin 81 MG chewable tablet Chew 81 mg by mouth daily.   Yes [provider]  famotidine (PEPCID) 20 MG tablet Take 20 mg by mouth daily.   Yes [provider]  ferrous sulfate 325 (65 FE) MG tablet Take 325 mg by mouth 2 (two) times daily with a meal.   Yes [provider]  furosemide (LASIX) 40 MG tablet Take 40 mg by mouth daily.   Yes [provider]  insulin lispro (HUMALOG) 100 UNIT/ML injection Inject 0-10 Units into the skin 3 (three) times daily before meals. 70-150 units=0 units,  151-200=200 units,  201-250=4  units,  251-300=6 units, 301-350= 8 units, 351 += 10 units, 400 or higher CALL MD   Yes [provider]  isosorbide mononitrate (IMDUR) 30 MG 24 hr tablet Take 30 mg by mouth daily.   Yes [provider]  lisinopril (PRINIVIL,ZESTRIL) 10 MG tablet Take 10 mg by mouth daily.   Yes [provider]  metoprolol tartrate (LOPRESSOR) 50 MG tablet Take 50 mg by mouth 2 (two) times daily.   Yes [provider]  nitroGLYCERIN (NITROSTAT) 0.4 MG SL tablet Place 0.4 mg under the tongue every 5 (five) minutes as needed for chest pain.   Yes [provider]  Nutritional Supplements (FEEDING SUPPLEMENT, NEPRO CARB STEADY,) LIQD Take 237 mLs by mouth daily.   Yes [provider]  pantoprazole (PROTONIX) 40 MG tablet Take 40 mg by mouth daily.   Yes [provider]    Family History No family history on file.  Social History Social History  Substance Use Topics  . Smoking status: Not on file  . Smokeless tobacco: Not on file  . Alcohol use Not on file     Allergies   Patient has no known allergies.   Review of Systems Review of Systems  Unable to perform ROS: Acuity of condition     Physical Exam Updated Vital Signs BP (!) 76/50   Pulse (!) 117   Temp (!)  97.4 F (36.3 C) (Oral)   Resp (!) 21   Ht 6' (1.829 m)   Wt 81.6 kg (180 lb)   SpO2 98%   BMI 24.41 kg/m   Physical Exam  Constitutional:  Awake. Chronically ill-appearing.  HENT:  Conjunctivae white.  Eyes:  3 mm reactive.  Neck:  No JVD  Cardiovascular:  Heart rate 109  Pulmonary/Chest:  No increased work of breathing.  Abdominal:  Soft, benign Abdomen. Denies abdominal pain.  Blood on rectal exam.  Genitourinary: Rectal exam shows guaiac positive stool.  Musculoskeletal: Normal range of motion.  Left AKA  Neurological: He is alert.  Skin: Skin is warm.     ED Treatments / Results  Labs (all labs ordered are listed, but only abnormal results are  displayed) Labs Reviewed  CBC WITH DIFFERENTIAL/PLATELET - Abnormal; Notable for the following:       Result Value   WBC 11.8 (*)    RBC 2.13 (*)    Hemoglobin 6.3 (*)    HCT 18.7 (*)    RDW 16.2 (*)    Neutro Abs 9.0 (*)    All other components within normal limits  COMPREHENSIVE METABOLIC PANEL - Abnormal; Notable for the following:    Glucose, Bld 185 (*)    BUN 28 (*)    Creatinine, Ser 2.99 (*)    Calcium 7.7 (*)    Total Protein 4.7 (*)    Albumin 2.5 (*)    GFR calc non Af Amer 20 (*)    GFR calc Af Amer 24 (*)    All other components within normal limits  PROTIME-INR - Abnormal; Notable for the following:    Prothrombin Time 19.3 (*)    All other components within normal limits  CBG MONITORING, ED - Abnormal; Notable for the following:    Glucose-Capillary 148 (*)    All other components within normal limits  I-STAT CG4 LACTIC ACID, ED - Abnormal; Notable for the following:    Lactic Acid, Venous 2.38 (*)    All other components within normal limits  CULTURE, BLOOD (ROUTINE X 2)  CULTURE, BLOOD (ROUTINE X 2)  TROPONIN I  URINALYSIS, ROUTINE W REFLEX MICROSCOPIC  I-STAT TROPOININ, ED  TYPE AND SCREEN  PREPARE RBC (CROSSMATCH)  ABO/RH    EKG  EKG Interpretation  Date/Time:  Wednesday September 04 2016 06:52:44 EDT Ventricular Rate:  114 PR Interval:    QRS Duration: 105 QT Interval:  322 QTC Calculation: 444 R Axis:   159 Text Interpretation:  Sinus tachycardia Right axis deviation ST depressions anteriorly Confirmed by Tanna Furry [phone]) on 09/04/2016 6:55:28 AM       Radiology Dg Chest Port 1 View  Result Date: 09/04/2016 CLINICAL DATA:  GI bleed.  Possible sepsis . EXAM: PORTABLE CHEST 1 VIEW COMPARISON:  No recent prior . FINDINGS: Right IJ dual-lumen catheter with tip projected over the right atrium. Cardiomegaly with bilateral pulmonary infiltrates consistent with pulmonary edema, right side greater than. Small right pleural effusion. IMPRESSION: 1.  Right IJ dual-lumen catheter with tip projected over right atrium. 2. Congestive heart failure with pulmonary edema, right side greater than left. Basilar pneumonia cannot be excluded. Right-sided pleural effusion. Electronically Signed   By: Marcello Moores  Register   On: 09/04/2016 07:32    Procedures Procedures (including critical care time)  Medications Ordered in ED Medications  0.9 %  sodium chloride infusion (not administered)  heparin lock flush 100 unit/mL (not administered)  prothrombin complex conc human (KCENTRA) IVPB  3,875 Units (not administered)     Initial Impression / Assessment and Plan / ED Course  I have reviewed the triage vital signs and the nursing notes.  Pertinent labs & imaging results that were available during my care of the patient were reviewed by me and considered in my medical decision making (see chart for details).   hemoglobin 6.5. Remains tachycardic. Given 1 L fluid. Given 2 units O+ blood. A central ordered and being infused for his request. Discussed the case with Dr. Glorious Peach ICU, as well as GI.  After 1 L normal saline, 2 units of blood, patient's pressure 90. Continues to maintain a normal level of consciousness. Does not airway protection or intervention.  CRITICAL CARE Performed by: Tanna Furry JOSEPH   Total critical care time: 60 minutes  Critical care time was exclusive of separately billable procedures and treating other patients.  Critical care was necessary to treat or prevent imminent or life-threatening deterioration.  Critical care was time spent personally by me on the following activities: development of treatment plan with patient and/or surrogate as well as nursing, discussions with consultants, evaluation of patient's response to treatment, examination of patient, obtaining history from patient or surrogate, ordering and performing treatments and interventions, ordering and review of laboratory studies, ordering and review of  radiographic studies, pulse oximetry and re-evaluation of patient's condition.  CENTRAL LINE Performed by: Lolita Patella Consent: The procedure was performed in an emergent situation. Required items: required blood products, implants, devices, and special equipment available Patient identity confirmed: arm band and provided demographic data Time out: Immediately prior to procedure a "time out" was called to verify the correct patient, procedure, equipment, support staff and site/side marked as required. Indications: vascular access Anesthesia: local infiltration Local anesthetic: lidocaine 1% with epinephrine Anesthetic total: 3 ml Patient sedated: no Preparation: skin prepped with 2% chlorhexidine Skin prep agent dried: skin prep agent completely dried prior to procedure Sterile barriers: all five maximum sterile barriers used - cap, mask, sterile gown, sterile gloves, and large sterile sheet Hand hygiene: hand hygiene performed prior to central venous catheter insertion  Location details: right groin, RFA2  Catheter type: triple lumen Catheter size: 8 Fr Pre-procedure: landmarks identified Ultrasound guidance: + Successful placement: yes Post-procedure: line sutured and dressing applied Assessment: blood return through all parts, free fluid flow, placement verified by x-ray and no pneumothorax on x-ray Patient tolerance: Patient tolerated the procedure well with no immediate complications.   Final Clinical Impressions(s) / ED Diagnoses   Final diagnoses:  GI bleed    New Prescriptions New Prescriptions   No medications on file     Tanna Furry, MD 09/04/16 [phone]

## 2016-09-04 NOTE — Progress Notes (Signed)
eLink Physician-Brief Progress Note Patient Name: George Burgess DOB: 03-11-1950 MRN: [id number]   Date of Service  09/04/2016  HPI/Events of Note  ALOC - Blood glucose = 114.  eICU Interventions  Will order: 1. ABG STAT.     Intervention Category Major Interventions: Change in mental status - evaluation and management  Shanikka Wonders Eugene 09/04/2016, 9:03 PM

## 2016-09-04 NOTE — Consult Note (Signed)
Reason for Consult: To manage dialysis and dialysis related needs Referring Physician:  Dr. Carlye Grippe George Burgess is an 66 y.o. male.  HPI: Pt is a 69 M with ESRD on HD TTS, s/p L BKA, RLE ulcers, Afib on Eliquis, h/o CVA, and dementia who is now seen in consultation at the request of Dr. Vaughan Browner for management of ESRD and provision of dialysis.    Pt is a poor historian.  He was admitted from a nursing home with BRBPR.  He has been given KCentra and multiple units of pRBCs.  He just got a CTA.  UGI lavage apparently was negative.  He is hypotensive and tachycardic.  He has ongoing bleeding.  He is getting FEIBA now.   He is unsure of his dialysis unit; he thinks it's on [facility] in [place].  He is dialyzing through a permcath.    PMH/PSH: HTN ESRD on HD S/p L BKA. PAD Afib on Eliquis  Social History:  has no tobacco, alcohol, and drug history on file.  Allergies: No Known Allergies  Medications:  Scheduled: . heparin lock flush  500 Units Intracatheter Once  . insulin aspart  0-9 Units Subcutaneous Q4H  . [START ON 09/07/2016] pantoprazole  40 mg Intravenous Q12H    Results for orders placed or performed during the hospital encounter of 09/04/16 (from the past 48 hour(s))  CBG monitoring, ED     Status: Abnormal   Collection Time: 09/04/16  6:13 AM  Result Value Ref Range   Glucose-Capillary 148 (H) 65 - 99 mg/dL  ABO/Rh     Status: None   Collection Time: 09/04/16  6:25 AM  Result Value Ref Range   ABO/RH(D) B POS   Type and screen [place]     Status: None (Preliminary result)   Collection Time: 09/04/16  6:27 AM  Result Value Ref Range   ABO/RH(D) B POS    Antibody Screen NEG    Sample Expiration 09/07/2016    Unit Number [id number]    Blood Component Type RED CELLS,LR    Unit division 00    Status of Unit ISSUED    Transfusion Status OK TO TRANSFUSE    Crossmatch Result COMPATIBLE    Unit tag comment VERBAL ORDERS PER DR DR Jeneen Rinks     Unit Number [id number]    Blood Component Type RBC LR PHER2    Unit division 00    Status of Unit REL FROM [facility]    Transfusion Status OK TO TRANSFUSE    Crossmatch Result COMPATIBLE    Unit tag comment VERBAL ORDERS PER DR DR Jeneen Rinks    Unit Number [id number]    Blood Component Type RBC, LR IRR    Unit division 00    Status of Unit ISSUED    Transfusion Status OK TO TRANSFUSE    Crossmatch Result Compatible    Unit Number [id number]    Blood Component Type RED CELLS,LR    Unit division 00    Status of Unit ISSUED    Transfusion Status OK TO TRANSFUSE    Crossmatch Result Compatible    Unit Number [id number]    Blood Component Type RED CELLS,LR    Unit division 00    Status of Unit ALLOCATED    Transfusion Status OK TO TRANSFUSE    Crossmatch Result Compatible    Unit Number [id number]    Blood Component Type RED CELLS,LR    Unit division 00    Status of Unit  ALLOCATED    Transfusion Status OK TO TRANSFUSE    Crossmatch Result Compatible    Unit Number [id number]    Blood Component Type RED CELLS,LR    Unit division 00    Status of Unit ALLOCATED    Transfusion Status OK TO TRANSFUSE    Crossmatch Result Compatible    Unit Number [id number]    Blood Component Type RED CELLS,LR    Unit division 00    Status of Unit ALLOCATED    Transfusion Status OK TO TRANSFUSE    Crossmatch Result Compatible   Prepare RBC     Status: None   Collection Time: 09/04/16  6:27 AM  Result Value Ref Range   Order Confirmation ORDER PROCESSED BY BLOOD BANK   CBC with Differential     Status: Abnormal   Collection Time: 09/04/16  6:27 AM  Result Value Ref Range   WBC 11.8 (H) 4.0 - 10.5 K/uL   RBC 2.13 (L) 4.22 - 5.81 MIL/uL   Hemoglobin 6.3 (LL) 13.0 - 17.0 g/dL    Comment: REPEATED TO VERIFY CRITICAL RESULT CALLED TO, READ BACK BY AND VERIFIED WITH: DOSTER,T. RN _0  ON 7.18.18 BY NMCCOY    HCT 18.7 (L) 39.0 - 52.0 %   MCV 87.8 78.0 - 100.0 fL   MCH 29.6  26.0 - 34.0 pg   MCHC 33.7 30.0 - 36.0 g/dL   RDW 16.2 (H) 11.5 - 15.5 %   Platelets 210 150 - 400 K/uL   Neutrophils Relative % 76 %   Neutro Abs 9.0 (H) 1.7 - 7.7 K/uL   Lymphocytes Relative 14 %   Lymphs Abs 1.7 0.7 - 4.0 K/uL   Monocytes Relative 7 %   Monocytes Absolute 0.8 0.1 - 1.0 K/uL   Eosinophils Relative 4 %   Eosinophils Absolute 0.4 0.0 - 0.7 K/uL   Basophils Relative 0 %   Basophils Absolute 0.0 0.0 - 0.1 K/uL  Comprehensive metabolic panel     Status: Abnormal   Collection Time: 09/04/16  6:27 AM  Result Value Ref Range   Sodium 138 135 - 145 mmol/L   Potassium 3.9 3.5 - 5.1 mmol/L   Chloride 105 101 - 111 mmol/L   CO2 23 22 - 32 mmol/L   Glucose, Bld 185 (H) 65 - 99 mg/dL   BUN 28 (H) 6 - 20 mg/dL   Creatinine, Ser 2.99 (H) 0.61 - 1.24 mg/dL   Calcium 7.7 (L) 8.9 - 10.3 mg/dL   Total Protein 4.7 (L) 6.5 - 8.1 g/dL   Albumin 2.5 (L) 3.5 - 5.0 g/dL   AST 22 15 - 41 U/L   ALT 18 17 - 63 U/L   Alkaline Phosphatase 66 38 - 126 U/L   Total Bilirubin 0.4 0.3 - 1.2 mg/dL   GFR calc non Af Amer 20 (L) >60 mL/min   GFR calc Af Amer 24 (L) >60 mL/min    Comment: (NOTE) The eGFR has been calculated using the CKD EPI equation. This calculation has not been validated in all clinical situations. eGFR's persistently <60 mL/min signify possible Chronic Kidney Disease.    Anion gap 10 5 - 15  Protime-INR     Status: Abnormal   Collection Time: 09/04/16  6:27 AM  Result Value Ref Range   Prothrombin Time 19.3 (H) 11.4 - 15.2 seconds   INR 1.61   Troponin I     Status: Abnormal   Collection Time: 09/04/16  6:27 AM  Result Value Ref  Range   Troponin I 0.04 (HH) <0.03 ng/mL    Comment: CRITICAL RESULT CALLED TO, READ BACK BY AND VERIFIED WITH: C CARNEAL,RN _0  09/04/16 MKELLY   I-stat troponin, ED     Status: None   Collection Time: 09/04/16  7:01 AM  Result Value Ref Range   Troponin i, poc 0.04 0.00 - 0.08 ng/mL   Comment 3            Comment: Due to the  release kinetics of cTnI, a negative result within the first hours of the onset of symptoms does not rule out myocardial infarction with certainty. If myocardial infarction is still suspected, repeat the test at appropriate intervals.   I-Stat CG4 Lactic Acid, ED     Status: Abnormal   Collection Time: 09/04/16  7:16 AM  Result Value Ref Range   Lactic Acid, Venous 2.38 (HH) 0.5 - 1.9 mmol/L   Comment NOTIFIED PHYSICIAN   CBC     Status: Abnormal   Collection Time: 09/04/16  8:25 AM  Result Value Ref Range   WBC 20.4 (H) 4.0 - 10.5 K/uL   RBC 2.54 (L) 4.22 - 5.81 MIL/uL   Hemoglobin 7.4 (L) 13.0 - 17.0 g/dL   HCT 22.0 (L) 39.0 - 52.0 %   MCV 86.6 78.0 - 100.0 fL   MCH 29.1 26.0 - 34.0 pg   MCHC 33.6 30.0 - 36.0 g/dL   RDW 15.4 11.5 - 15.5 %   Platelets 155 150 - 400 K/uL  MRSA PCR Screening     Status: None   Collection Time: 09/04/16  9:27 AM  Result Value Ref Range   MRSA by PCR NEGATIVE NEGATIVE    Comment:        The GeneXpert MRSA Assay (FDA approved for NASAL specimens only), is one component of a comprehensive MRSA colonization surveillance program. It is not intended to diagnose MRSA infection nor to guide or monitor treatment for MRSA infections.   Prepare RBC     Status: None   Collection Time: 09/04/16 11:00 AM  Result Value Ref Range   Order Confirmation ORDER PROCESSED BY BLOOD BANK   Basic metabolic panel     Status: Abnormal   Collection Time: 09/04/16 11:27 AM  Result Value Ref Range   Sodium 139 135 - 145 mmol/L   Potassium 4.1 3.5 - 5.1 mmol/L   Chloride 109 101 - 111 mmol/L   CO2 20 (L) 22 - 32 mmol/L   Glucose, Bld 183 (H) 65 - 99 mg/dL   BUN 28 (H) 6 - 20 mg/dL   Creatinine, Ser 2.96 (H) 0.61 - 1.24 mg/dL   Calcium 7.2 (L) 8.9 - 10.3 mg/dL   GFR calc non Af Amer 21 (L) >60 mL/min   GFR calc Af Amer 24 (L) >60 mL/min    Comment: (NOTE) The eGFR has been calculated using the CKD EPI equation. This calculation has not been validated in all  clinical situations. eGFR's persistently <60 mL/min signify possible Chronic Kidney Disease.    Anion gap 10 5 - 15  Magnesium     Status: Abnormal   Collection Time: 09/04/16 11:27 AM  Result Value Ref Range   Magnesium 1.5 (L) 1.7 - 2.4 mg/dL  Phosphorus     Status: None   Collection Time: 09/04/16 11:27 AM  Result Value Ref Range   Phosphorus 3.0 2.5 - 4.6 mg/dL  Troponin I     Status: Abnormal   Collection Time: 09/04/16 11:27 AM  Result Value Ref Range   Troponin I 0.04 (HH) <0.03 ng/mL    Comment: CRITICAL VALUE NOTED.  VALUE IS CONSISTENT WITH PREVIOUSLY REPORTED AND CALLED VALUE.  Prepare RBC     Status: None   Collection Time: 09/04/16 12:08 PM  Result Value Ref Range   Order Confirmation ORDER PROCESSED BY BLOOD BANK   Glucose, capillary     Status: Abnormal   Collection Time: 09/04/16 12:22 PM  Result Value Ref Range   Glucose-Capillary 145 (H) 65 - 99 mg/dL    Dg Chest Port 1 View  Result Date: 09/04/2016 CLINICAL DATA:  GI bleed.  Possible sepsis . EXAM: PORTABLE CHEST 1 VIEW COMPARISON:  No recent prior . FINDINGS: Right IJ dual-lumen catheter with tip projected over the right atrium. Cardiomegaly with bilateral pulmonary infiltrates consistent with pulmonary edema, right side greater than. Small right pleural effusion. IMPRESSION: 1. Right IJ dual-lumen catheter with tip projected over right atrium. 2. Congestive heart failure with pulmonary edema, right side greater than left. Basilar pneumonia cannot be excluded. Right-sided pleural effusion. Electronically Signed   By: Marcello Moores  Register   On: 09/04/2016 07:32    ROS: all other systems reviewed and is negative except as per HPI Blood pressure (!) 62/39, pulse 63, temperature (!) 96.5 F (35.8 C), temperature source Rectal, resp. rate 20, height 6' (1.829 m), weight 73.3 kg (161 lb 9.6 oz), SpO2 99 %. .  GEN cachectic, curled in fetal position HEENT NG in place, not draining NECK no JVD PULM poor air movement  bilaterally, no diffuse crackles CV tachycardic, no m/r/g ABD nontender GU: BRBPR. EXT s/p L BKA, R leg with 2 ulcers, dressed on the dorsum of the foot and the R shin.  R foot is cool  NEURO nonfocal ACCESS: R IJ permcath   Assessment/Plan: 1 Acute GIB: likely lower; s/p CTA to localize bleeding.  In hemorrhagic shock.  Receiving resuscitation.  Per PCCM 2. Afib on Eliquis: received KCentra and is getting FEIBA.   3 ESRD: TTS.  Will get records.  No acute indication for dialysis currently; if needed would start CRRT 4 Volume: not grossly vol overloaded 5. Anemia of ESRD/ ABLA: getting blood products, will give iron and ESA as appropriate 6 Metabolic Bone Disease: will place on binders and VDRA as appropriate  George Burgess Venne 09/04/2016, 1:24 PM

## 2016-09-04 NOTE — ED Notes (Signed)
Patient transported to X-ray 

## 2016-09-04 NOTE — ED Notes (Signed)
Bed: RESB Expected date:  Expected time:  Means of arrival:  Comments: EMS GI bleed on blood thinners with SBP 80

## 2016-09-04 NOTE — Consult Note (Signed)
Referring Provider:  Marni Griffon, PCCM Primary Care Physician:  Caprice Renshaw, MD Primary Gastroenterologist:  Althia Forts  Reason for Consultation:  HPI: George Burgess is a 66 y.o. male w/ sig h/o CAF (on DOAC with Eliquis), HFrEF (EF 30%), prior CVA, vascular dementia, HTN, diabetes type II,  Bilateral Pleural effusions, PVD, prior Left BKA, chronic LE wounds, ESRD on HD. Resides at [facility]. Presented from [facility] 7/18 after having 2 large volume maroon colored stools and BP in 70s. On arrival to ED he was awake. Remained hypotensive w/ SBP in 70s but would increase w/ fluid and blood resuscitation. His initial Hgb was 6.3 grams. He was given K centra, received 2 units of blood In ED but remained hypotensive. Because of this PCCM asked to admit.  GI was called.  Just received 3rd unit PRBC's.  Hgb 7.4 grams most recently.  While in the room with patient and nurses, the patient continued to pass dark red blood from rectum.  NGT was placed by nurses, placement confirmed and lavaged by Dr. Havery Moros.  Lavage was negative for UGIB.  Unsure of any past GI history.   No past medical history on file.  No past surgical history on file.  Prior to Admission medications   Medication Sig Start Date End Date Taking? Authorizing Provider  acetaminophen (TYLENOL) 325 MG tablet Take 650 mg by mouth every 4 (four) hours as needed for moderate pain.   Yes [provider]  apixaban (ELIQUIS) 2.5 MG TABS tablet Take 2.5 mg by mouth 2 (two) times daily.   Yes [provider]  ascorbic acid (VITAMIN C) 500 MG tablet Take 500 mg by mouth daily.   Yes [provider]  aspirin 81 MG chewable tablet Chew 81 mg by mouth daily.   Yes [provider]  famotidine (PEPCID) 20 MG tablet Take 20 mg by mouth daily.   Yes [provider]  ferrous sulfate 325 (65 FE) MG tablet Take 325 mg by mouth 2 (two) times daily with a meal.   Yes [provider]  furosemide (LASIX) 40 MG  tablet Take 40 mg by mouth daily.   Yes [provider]  insulin lispro (HUMALOG) 100 UNIT/ML injection Inject 0-10 Units into the skin 3 (three) times daily before meals. 70-150 units=0 units,  151-200=200 units,  201-250=4 units,  251-300=6 units, 301-350= 8 units, 351 += 10 units, 400 or higher CALL MD   Yes [provider]  isosorbide mononitrate (IMDUR) 30 MG 24 hr tablet Take 30 mg by mouth daily.   Yes [provider]  lisinopril (PRINIVIL,ZESTRIL) 10 MG tablet Take 10 mg by mouth daily.   Yes [provider]  metoprolol tartrate (LOPRESSOR) 50 MG tablet Take 50 mg by mouth 2 (two) times daily.   Yes [provider]  nitroGLYCERIN (NITROSTAT) 0.4 MG SL tablet Place 0.4 mg under the tongue every 5 (five) minutes as needed for chest pain.   Yes [provider]  Nutritional Supplements (FEEDING SUPPLEMENT, NEPRO CARB STEADY,) LIQD Take 237 mLs by mouth daily.   Yes [provider]  pantoprazole (PROTONIX) 40 MG tablet Take 40 mg by mouth daily.   Yes [provider]    Current Facility-Administered Medications  Medication Dose Route Frequency Provider Last Rate Last Dose  . 0.9 %  sodium chloride infusion   Intravenous Once Tanna Furry, MD      . 0.9 %  sodium chloride infusion   Intravenous Once Tanna Furry, MD  Stopped at 09/04/16 0800  . 0.9 %  sodium chloride infusion   Intravenous Once Erick Colace, NP   Stopped at 09/04/16 1045  . anti-inhibitor coagulant complex (FEIBA NF) IVPB 1,833 Units  25 Units/kg Intravenous STAT Mannam, Praveen, MD      . heparin lock flush 100 unit/mL  500 Units Intracatheter Once Tanna Furry, MD      . insulin aspart (novoLOG) injection 0-9 Units  0-9 Units Subcutaneous Q4H Erick Colace, NP      . pantoprazole (PROTONIX) 80 mg in sodium chloride 0.9 % 250 mL (0.32 mg/mL) infusion  8 mg/hr Intravenous Continuous Erick Colace, NP 25 mL/hr at 09/04/16 1200 8 mg/hr at 09/04/16 1200    . [START ON 09/07/2016] pantoprazole (PROTONIX) injection 40 mg  40 mg Intravenous Q12H Salvadore Dom E, NP      . phenylephrine (NEOSYNEPHRINE) 10-0.9 MG/250ML-% infusion  0-400 mcg/min Intravenous Titrated Erick Colace, NP 37.5 mL/hr at 09/04/16 1200 25 mcg/min at 09/04/16 1200    Allergies as of 09/04/2016  . (No Known Allergies)    No family history on file.  Social History   Social History  . Marital status: Single    Spouse name: N/A  . Number of children: N/A  . Years of education: N/A   Occupational History  . Not on file.   Social History Main Topics  . Smoking status: Not on file  . Smokeless tobacco: Not on file  . Alcohol use Not on file  . Drug use: Unknown  . Sexual activity: Not on file   Other Topics Concern  . Not on file   Social History Narrative  . No narrative on file    Review of Systems: ROS is O/W negative except as mentioned in HPI.  Physical Exam: Vital signs in last 24 hours: Temp:  [96.5 F (35.8 C)-97.6 F (36.4 C)] 96.5 F (35.8 C) (07/18 0930) Pulse Rate:  [63-117] 63 (07/18 0945) Resp:  [12-28] 18 (07/18 1200) BP: (52-115)/(24-78) 79/32 (07/18 1200) SpO2:  [98 %-99 %] 99 % (07/18 0945) Weight:  [161 lb 9.6 oz (73.3 kg)-180 lb (81.6 kg)] 161 lb 9.6 oz (73.3 kg) (07/18 0925) Last BM Date: 09/04/16 General:  Alert, chronically ill-appearing. Head:  Normocephalic and atraumatic. Eyes:  Sclera clear, no icterus.  Conjunctiva pink. Ears:  Normal auditory acuity. Mouth:  No deformity or lesions.   Lungs:  Clear throughout to auscultation.  No wheezes, crackles, or rhonchi.  Heart:  Regular rate and rhythm; no murmurs, clicks, rubs, or gallops. Abdomen:  Soft, non-distended.  BS present.  Non-tender. Rectal:  Deferred  Msk:  Symmetrical without gross deformities. Pulses:  Normal pulses noted. Extremities: Left BKA. Neurologic:  Alert, some confusion;  grossly normal neurologically. Skin:  Intact without significant lesions  or rashes.  Intake/Output this shift: Total I/O In: 3548.8 [I.V.:2122.4; Blood:1119; IV Piggyback:307.4] Out: -   Lab Results:  Recent Labs  09/04/16 0627 09/04/16 0825  WBC 11.8* 20.4*  HGB 6.3* 7.4*  HCT 18.7* 22.0*  PLT 210 155   BMET  Recent Labs  09/04/16 0627 09/04/16 1127  NA 138 139  K 3.9 4.1  CL 105 109  CO2 23 20*  GLUCOSE 185* 183*  BUN 28* 28*  CREATININE 2.99* 2.96*  CALCIUM 7.7* 7.2*   LFT  Recent Labs  09/04/16 0627  PROT 4.7*  ALBUMIN 2.5*  AST 22  ALT 18  ALKPHOS 66  BILITOT 0.4   PT/INR  Recent Labs  09/04/16 0627  LABPROT 19.3*  INR 1.61   Studies/Results: Dg Chest Port 1 View  Result Date: 09/04/2016 CLINICAL DATA:  GI bleed.  Possible sepsis . EXAM: PORTABLE CHEST 1 VIEW COMPARISON:  No recent prior . FINDINGS: Right IJ dual-lumen catheter with tip projected over the right atrium. Cardiomegaly with bilateral pulmonary infiltrates consistent with pulmonary edema, right side greater than. Small right pleural effusion. IMPRESSION: 1. Right IJ dual-lumen catheter with tip projected over right atrium. 2. Congestive heart failure with pulmonary edema, right side greater than left. Basilar pneumonia cannot be excluded. Right-sided pleural effusion. Electronically Signed   By: Marcello Moores  Register   On: 09/04/2016 07:32   IMPRESSION:  -GIB:  Suspect lower bleed such as diverticular after NG lavage was negative this morning.   -ALBA:  Has received 3 units PRBC's and Hgb has only increased by one gram. -Chronic anticoagulation with Eliquis for atrial fibrillation:  Given Kcentra and going to receive more as well as DDAVP. -Hemorrhagic shock:  On pressors. -ESRD:  On HD.  Renal consulting.  ? Need for CVVH. -Vascular dementia:  Poor historian.  PLAN: -CTA of the abdomen and pelvis ordered STAT, but has not yet been performed.  Spoke with radiology and patient is about to be taken down for study (were waiting on special IV access).  IR aware of  case.  Delight Bickle D.  09/04/2016, 12:26 PM  Pager number [phone]

## 2016-09-04 NOTE — ED Notes (Signed)
Patient has been approved for room 1237, can call report or 20 minute timer to [facility] @ [phone].

## 2016-09-04 NOTE — Consult Note (Signed)
[place] Nurse wound consult note Reason for Consult:venous stasis ulcers, stage II pressure ulcer Wound type: full thickness Pressure Injury POA: Yes Measurement:left stump 2cm x 2cm x 0.1cm red wound bed, intact periwound no drainage Right dorsal foot stage II pressure ulcer from maybe a shoe. Pink wound bed, dry skin periwound, mod drainage R pretibial wound 3.5cm x 3.5cm x 0.1cm pink bed, dry periwound, mod drainage Wound bed:see above Drainage (amount, consistency, odor) see above Periwound:see above Dressing procedure/placement/frequency:I have provided nurses with orders for To right pre tibial wound and two wounds on right dorsal foot, cleanse with NS, pat dry, apply aquacel, cover with foam, perform daily. We will not follow, but will remain available to this patient, to nursing, and the medical and/or surgical teams.  Please re-consult if we need to assist further.    Fara Olden, RN-C, WTA-C, [facility] Wound Treatment Associate

## 2016-09-05 ENCOUNTER — Inpatient Hospital Stay (HOSPITAL_COMMUNITY): Payer: Medicare Other

## 2016-09-05 DIAGNOSIS — K5730 Diverticulosis of large intestine without perforation or abscess without bleeding: Secondary | ICD-10-CM

## 2016-09-05 DIAGNOSIS — E440 Moderate protein-calorie malnutrition: Secondary | ICD-10-CM | POA: Insufficient documentation

## 2016-09-05 DIAGNOSIS — I340 Nonrheumatic mitral (valve) insufficiency: Secondary | ICD-10-CM

## 2016-09-05 LAB — GLUCOSE, CAPILLARY
GLUCOSE-CAPILLARY: 124 mg/dL — AB (ref 65–99)
GLUCOSE-CAPILLARY: 94 mg/dL (ref 65–99)
Glucose-Capillary: 124 mg/dL — ABNORMAL HIGH (ref 65–99)
Glucose-Capillary: 85 mg/dL (ref 65–99)
Glucose-Capillary: 93 mg/dL (ref 65–99)
Glucose-Capillary: 91 mg/dL (ref 65–99)

## 2016-09-05 LAB — CBC
HCT: 18.7 % — ABNORMAL LOW (ref 39.0–52.0)
HCT: 18.3 % — ABNORMAL LOW (ref 39.0–52.0)
HEMATOCRIT: 17.7 % — AB (ref 39.0–52.0)
HEMOGLOBIN: 6.3 g/dL — AB (ref 13.0–17.0)
Hemoglobin: 6.7 g/dL — CL (ref 13.0–17.0)
Hemoglobin: 6.5 g/dL — CL (ref 13.0–17.0)
MCH: 30.2 pg (ref 26.0–34.0)
MCH: 30.1 pg (ref 26.0–34.0)
MCH: 30.1 pg (ref 26.0–34.0)
MCHC: 35.8 g/dL (ref 30.0–36.0)
MCHC: 35.5 g/dL (ref 30.0–36.0)
MCHC: 35.6 g/dL (ref 30.0–36.0)
MCV: 84.2 fL (ref 78.0–100.0)
MCV: 84.7 fL (ref 78.0–100.0)
MCV: 84.7 fL (ref 78.0–100.0)
PLATELETS: 115 10*3/uL — AB (ref 150–400)
PLATELETS: 117 10*3/uL — AB (ref 150–400)
Platelets: 126 10*3/uL — ABNORMAL LOW (ref 150–400)
RBC: 2.22 MIL/uL — AB (ref 4.22–5.81)
RBC: 2.16 MIL/uL — AB (ref 4.22–5.81)
RBC: 2.09 MIL/uL — ABNORMAL LOW (ref 4.22–5.81)
RDW: 15.4 % (ref 11.5–15.5)
RDW: 15.6 % — ABNORMAL HIGH (ref 11.5–15.5)
RDW: 15.6 % — AB (ref 11.5–15.5)
WBC: 16 10*3/uL — AB (ref 4.0–10.5)
WBC: 16.2 10*3/uL — ABNORMAL HIGH (ref 4.0–10.5)
WBC: 16.8 10*3/uL — ABNORMAL HIGH (ref 4.0–10.5)

## 2016-09-05 LAB — BASIC METABOLIC PANEL
ANION GAP: 7 (ref 5–15)
ANION GAP: 10 (ref 5–15)
BUN: 35 mg/dL — ABNORMAL HIGH (ref 6–20)
BUN: 35 mg/dL — ABNORMAL HIGH (ref 6–20)
CALCIUM: 7.9 mg/dL — AB (ref 8.9–10.3)
CHLORIDE: 108 mmol/L (ref 101–111)
CO2: 21 mmol/L — ABNORMAL LOW (ref 22–32)
CO2: 20 mmol/L — AB (ref 22–32)
Calcium: 6.3 mg/dL — CL (ref 8.9–10.3)
Chloride: 111 mmol/L (ref 101–111)
Creatinine, Ser: 3.43 mg/dL — ABNORMAL HIGH (ref 0.61–1.24)
Creatinine, Ser: 3.71 mg/dL — ABNORMAL HIGH (ref 0.61–1.24)
GFR calc non Af Amer: 16 mL/min — ABNORMAL LOW (ref >60)
GFR, EST AFRICAN AMERICAN: 20 mL/min — AB (ref >60)
GFR, EST AFRICAN AMERICAN: 18 mL/min — AB (ref >60)
GFR, EST NON AFRICAN AMERICAN: 17 mL/min — AB (ref >60)
GLUCOSE: 130 mg/dL — AB (ref 65–99)
Glucose, Bld: 115 mg/dL — ABNORMAL HIGH (ref 65–99)
POTASSIUM: 5.6 mmol/L — AB (ref 3.5–5.1)
Potassium: 4.4 mmol/L (ref 3.5–5.1)
SODIUM: 139 mmol/L (ref 135–145)
SODIUM: 138 mmol/L (ref 135–145)

## 2016-09-05 LAB — RENAL FUNCTION PANEL
ALBUMIN: 2.9 g/dL — AB (ref 3.5–5.0)
Anion gap: 9 (ref 5–15)
BUN: 35 mg/dL — ABNORMAL HIGH (ref 6–20)
CALCIUM: 7.9 mg/dL — AB (ref 8.9–10.3)
CO2: 20 mmol/L — AB (ref 22–32)
CREATININE: 3.65 mg/dL — AB (ref 0.61–1.24)
Chloride: 111 mmol/L (ref 101–111)
GFR calc Af Amer: 19 mL/min — ABNORMAL LOW (ref >60)
GFR calc non Af Amer: 16 mL/min — ABNORMAL LOW (ref >60)
GLUCOSE: 117 mg/dL — AB (ref 65–99)
PHOSPHORUS: 3.4 mg/dL (ref 2.5–4.6)
Potassium: 4.5 mmol/L (ref 3.5–5.1)
SODIUM: 140 mmol/L (ref 135–145)

## 2016-09-05 LAB — PREPARE RBC (CROSSMATCH)

## 2016-09-05 LAB — APTT

## 2016-09-05 LAB — ECHOCARDIOGRAM COMPLETE
Height: 72 in
WEIGHTICAEL: 2606.72 [oz_av]

## 2016-09-05 LAB — ALBUMIN: ALBUMIN: 2.6 g/dL — AB (ref 3.5–5.0)

## 2016-09-05 LAB — TROPONIN I
Troponin I: 8.67 ng/mL (ref <0.03)
Troponin I: 9.7 ng/mL (ref <0.03)

## 2016-09-05 LAB — MAGNESIUM
MAGNESIUM: 1.2 mg/dL — AB (ref 1.7–2.4)
Magnesium: 1.6 mg/dL — ABNORMAL LOW (ref 1.7–2.4)

## 2016-09-05 LAB — PHOSPHORUS: Phosphorus: 3.2 mg/dL (ref 2.5–4.6)

## 2016-09-05 LAB — PROTIME-INR
INR: 1.33
Prothrombin Time: 16.6 seconds — ABNORMAL HIGH (ref 11.4–15.2)

## 2016-09-05 MED ORDER — CHLORHEXIDINE GLUCONATE CLOTH 2 % EX PADS
6.0000 | MEDICATED_PAD | Freq: Every day | CUTANEOUS | Status: DC
  Administered 2016-09-05 – 2016-09-07 (×3): 6 via TOPICAL

## 2016-09-05 MED ORDER — SODIUM CHLORIDE 0.9 % IV SOLN: Freq: Once | INTRAVENOUS | Status: DC

## 2016-09-05 MED ORDER — PRISMASOL BGK 4/2.5 32-4-2.5 MEQ/L IV SOLN
INTRAVENOUS | Status: DC
  Administered 2016-09-05 – 2016-09-06 (×2): via INTRAVENOUS_CENTRAL
  Filled 2016-09-05 (×5): qty 5000

## 2016-09-05 MED ORDER — HEPARIN SODIUM (PORCINE) 1000 UNIT/ML DIALYSIS
1000.0000 [IU] | INTRAMUSCULAR | Status: DC | PRN
  Administered 2016-09-06: 4000 [IU] via INTRAVENOUS_CENTRAL
  Filled 2016-09-05 (×2): qty 6
  Filled 2016-09-05: qty 4

## 2016-09-05 MED ORDER — SODIUM CHLORIDE 0.9% FLUSH
10.0000 mL | INTRAVENOUS | Status: DC | PRN
  Administered 2016-09-06: 10 mL
  Filled 2016-09-05: qty 40

## 2016-09-05 MED ORDER — FENTANYL CITRATE (PF) 100 MCG/2ML IJ SOLN
25.0000 ug | INTRAMUSCULAR | Status: DC | PRN
  Administered 2016-09-05: 50 ug via INTRAVENOUS
  Administered 2016-09-05: 25 ug via INTRAVENOUS
  Administered 2016-09-09: 50 ug via INTRAVENOUS
  Filled 2016-09-05 (×3): qty 2

## 2016-09-05 MED ORDER — PANTOPRAZOLE SODIUM 40 MG IV SOLR
40.0000 mg | Freq: Two times a day (BID) | INTRAVENOUS | Status: DC
  Administered 2016-09-05 – 2016-09-09 (×8): 40 mg via INTRAVENOUS
  Filled 2016-09-05 (×8): qty 40

## 2016-09-05 MED ORDER — INSULIN ASPART 100 UNIT/ML IV SOLN
10.0000 [IU] | Freq: Once | INTRAVENOUS | Status: AC
  Administered 2016-09-05: 10 [IU] via INTRAVENOUS

## 2016-09-05 MED ORDER — PANTOPRAZOLE SODIUM 40 MG IV SOLR: 40.0000 mg | Freq: Every day | INTRAVENOUS | Status: DC

## 2016-09-05 MED ORDER — SODIUM CHLORIDE 0.9% FLUSH
10.0000 mL | Freq: Two times a day (BID) | INTRAVENOUS | Status: DC
  Administered 2016-09-05 (×2): 20 mL
  Administered 2016-09-06 (×2): 10 mL
  Administered 2016-09-07: 20 mL
  Administered 2016-09-07 – 2016-09-09 (×4): 10 mL

## 2016-09-05 MED ORDER — PRISMASOL BGK 4/2.5 32-4-2.5 MEQ/L IV SOLN
INTRAVENOUS | Status: DC
  Administered 2016-09-05 – 2016-09-06 (×5): via INTRAVENOUS_CENTRAL
  Filled 2016-09-05 (×9): qty 5000

## 2016-09-05 MED ORDER — SODIUM CHLORIDE 0.9 % FOR CRRT
INTRAVENOUS_CENTRAL | Status: DC | PRN
  Filled 2016-09-05: qty 1000

## 2016-09-05 MED ORDER — SODIUM POLYSTYRENE SULFONATE 15 GM/60ML PO SUSP: 30.0000 g | Freq: Once | ORAL | Status: DC

## 2016-09-05 MED ORDER — PRISMASOL BGK 4/2.5 32-4-2.5 MEQ/L IV SOLN
INTRAVENOUS | Status: DC
  Administered 2016-09-05: 15:00:00 via INTRAVENOUS_CENTRAL
  Filled 2016-09-05 (×2): qty 5000

## 2016-09-05 MED ORDER — METOPROLOL TARTRATE 5 MG/5ML IV SOLN
2.5000 mg | INTRAVENOUS | Status: DC | PRN
  Administered 2016-09-05: 2.5 mg via INTRAVENOUS
  Filled 2016-09-05: qty 5

## 2016-09-05 MED ORDER — DEXTROSE 50 % IV SOLN
1.0000 | Freq: Once | INTRAVENOUS | Status: AC
  Administered 2016-09-05: 50 mL via INTRAVENOUS
  Filled 2016-09-05: qty 50

## 2016-09-05 MED ORDER — MORPHINE SULFATE (PF) 2 MG/ML IV SOLN
2.0000 mg | INTRAVENOUS | Status: DC | PRN
  Administered 2016-09-05: 2 mg via INTRAVENOUS
  Filled 2016-09-05 (×2): qty 1

## 2016-09-05 NOTE — Progress Notes (Signed)
eLink Physician-Brief Progress Note Patient Name: George Burgess DOB: [birth year] MRN: [id number]   Date of Service  09/05/2016  HPI/Events of Note  Labs reviewed K increased, HGB low  eICU Interventions  Give kayexalate, PMD to obtain consent for blood transfusion, check albumin level to correct ca     Intervention Category Evaluation Type: Other  [place] Sorn 09/05/2016, 4:00 AM

## 2016-09-05 NOTE — Progress Notes (Signed)
  [facility] Progress Note   Assessment/ Plan:   Assessment/Plan: 1 Acute GIB: likely lower; rectosigmoid.  Transfuse PRN.  Per PCCM 2. Afib on Eliquis: received KCentra, got  FEIBA.   3 ESRD: TTS.  Will get records.  Will do CRRT for hopefully just 24 hours as Hgb still dropping. 4 Volume: not grossly vol overloaded 5. Anemia of ESRD/ ABLA: getting blood products, will give iron and ESA as appropriate 6 Metabolic Bone Disease: will place on binders and VDRA as appropriate  Subjective:    NM scan with some subtle radiotracer uptake in the rectosigmoid area.  Agitated, in mitts this AM.  Hgb still dropping.  Off pressor   Objective:   BP (!) 141/79   Pulse (!) 115   Temp 97.8 F (36.6 C) (Oral)   Resp (!) 23   Ht 6' (1.829 m)   Wt 73.9 kg (162 lb 14.7 oz)   SpO2 98%   BMI 22.10 kg/m   Physical Exam: GEN cachectic, curled in fetal position HEENT NG in place, not draining NECK no JVD PULM poor air movement bilaterally, no diffuse crackles CV tachycardic, no m/r/g ABD nontender GU: no more BRBPR EXT s/p L BKA, R leg with 2 ulcers, dressed on the dorsum of the foot and the R shin.  R foot is cool  NEURO nonfocal ACCESS: George Burgess  Labs: BMET  Recent Labs Lab 09/04/16 0627 09/04/16 1127 09/05/16 0304  NA 138 139 139  K 3.9 4.1 5.6*  CL 105 109 111  CO2 23 20* 21*  GLUCOSE 185* 183* 130*  BUN 28* 28* 35*  CREATININE 2.99* 2.96* 3.43*  CALCIUM 7.7* 7.2* 6.3*  PHOS  --  3.0 3.2   CBC  Recent Labs Lab 09/04/16 0627  09/04/16 1428 09/04/16 2150 09/05/16 0304 09/05/16 0926  WBC 11.8*  < > 27.2* 18.9* 16.0* 16.2*  NEUTROABS 9.0*  --   --   --   --   --   HGB 6.3*  < > 7.2* 7.5* 6.7* 6.5*  HCT 18.7*  < > 20.2* 20.9* 18.7* 18.3*  MCV 87.8  < > 84.9 84.3 84.2 84.7  PLT 210  < > 146* 120* 115* 117*  < > = values in this interval not displayed.  @IMGRELPRIORS @ Medications:    . Chlorhexidine Gluconate Cloth  6 each Topical Daily  .  heparin lock flush  500 Units Intracatheter Once  . insulin aspart  0-9 Units Subcutaneous Q4H  . pantoprazole  40 mg Intravenous Q12H  . sodium chloride flush  10-40 mL Intracatheter Q12H    George Lips MD [facility] pgr [phone] 09/05/2016, 10:58 AM

## 2016-09-05 NOTE — Progress Notes (Signed)
This RN has attempted to contact family in regards to patient and his care.    1) Phone call placed to George Burgess (listed under patient contacts); George Burgess stated she was not a direct contact for this patient.   2) Call placed to George Burgess place to speak with the Social Worker; no answer message left for call to be returned.    3) Physical chart assessed for possible further contacts listed:  George Burgess was listed as patient's sister, but with no phone number nor address listed.    4) Chart listed George Burgess as patient's Primary Physician with number.  Call placed; however, George Burgess stated that he was also not in direct contact or care with the patient.    5) George Burgess place was called again, receptionist contacted to see if patient had any other family listed.  She stated the only person listed was George Burgess as his sister; however, they did not have a contact number or address listed.    Oncoming RN will be made aware of this.

## 2016-09-05 NOTE — Progress Notes (Signed)
Pt's HR went up into the 170's.  RN went to assess the patient. Patient stated "I need someone to help me."  RN assessed what he needed help with, and asked if he was having any chest pain.  HR went back down to the 110's-120's.   EKG was obtained.  Oxygen demands had increased, at this time he was on 10L [place] (waveform variable). [facility] RN was made aware of the happenings.  MD Oletta Darter made aware, orders placed and executed.   Patient HR now maintaining at 110's.  Pt oxygen decreased to 5L [place] and weaning.  Patient is resting comfortably with his eyes closed.  Will continue to monitor.

## 2016-09-05 NOTE — Progress Notes (Addendum)
Initial Nutrition Assessment  DOCUMENTATION CODES:   Non-severe (moderate) malnutrition in context of chronic illness  INTERVENTION:  - If TF warranted, recommend 30 mL Prostat TID with Vital 1.5 @ 25 mL/hr increase by 10 mL every 4 hours to reach goal rate of Vital 1.5 @ 55 mL/hr. At goal rate, this regimen will provide 2280 kcal, 134 grams of protein, and 1008 mL free water.  - RD will continue to monitor for nutrition-related plan and provide interventions/recommendations as warranted.   NUTRITION DIAGNOSIS:   Malnutrition (moderate/non-severe) related to chronic illness (ESRD on HD) as evidenced by moderate depletion of body fat, moderate depletions of muscle mass.  GOAL:   Patient will meet greater than or equal to 90% of their needs  MONITOR:   Weight trends, Labs, Skin, Other (Comment) (Nutrition-related plan)  REASON FOR ASSESSMENT:   Malnutrition Screening Tool  ASSESSMENT:   66 year old male w/ sig h/o: CAF (on DOAC), HFrEF (EF 30%), prior CVA, vascular dementia, HTN, diabetes type II,  Bilateral Pleural effusions, PVD, prior Left BKA, chronic LE wounds. Resides at SNF (has no HCPOA). Presented from SNF 6/18 after having 2 large Volume maroon colored stools and BP in 70s. On arrival to ED he was awake. Remained hypotensive w/ SBP in 70s but would increase w/ fluid and blood resuscitation. His initial Hgb was 6.3. He was given K centra, received 2 units of blood In ED but remained hypotensive. Because of this PCCM asked to admit.   Pt seen for MST. BMI indicates normal weight. Pt with NGT placed in the ED and is clamped at this time. Reviewed GI note from this AM but no indication of plan concerning swallow evaluation versus TF initiation. Spoke with RN who is unsure of plan as well. Reviewed paper chart from the facility which indicates that pt was on a Regular diet and was provided with Nepro Shake BID. Information in the chart also states pt is a high risk for aspiration d/t  difficulty swallowing and hx of choking/coughing episodes during meals.   Pt with dementia and no family/visitors present at this time. Pt responds "yes" and "uh huh" to all questions asked by RD and is not a reliable source.   Physical assessment shows moderate fat and moderate muscle wasting. No previous weight hx in EMR or paper chart. Will  Monitor weight trends closely during admission. TF recommendations outlined above if warranted. Per PCCM NP note yesterday, pt is a high risk for need for intubation.  Medications reviewed; sliding scale Novolog, 10 units Novolog x1 today, 40 mg IV Protonix BID. Labs reviewed; CBGs: 124 and 85 mg/dL, K: 5.6 mg/dL, Mg: 1.2 mg/dL, GFR: 20 mL/min, BUN: 35 mg/dL, creatinine: 3.43 mg/dL, Ca: 6.3 mg/dL.   ADDENDUM: Spoke with Agricultural consultant who reports pt to be starting CRRT today. Updated estimated nutrition and needs and TF recommendations based on this plan.     Diet Order:  Diet NPO time specified  Skin:  Wound (see comment) (Non-pressure wounds to R foot, R leg, L leg)  Last BM:  7/18  Height:   Ht Readings from Last 1 Encounters:  09/04/16 6' (1.829 m)    Weight:   Wt Readings from Last 1 Encounters:  09/05/16 162 lb 14.7 oz (73.9 kg)    Ideal Body Weight:  80.91 kg  BMI:  Body mass index is 22.1 kg/m.  Estimated Nutritional Needs:   Kcal:  4481-8563 (30-33 kcal/kg)  Protein:  125-140 grams (1.7-1.9 grams/kg)  Fluid:  per MD/NP given ESRD on HD and plan for CRRT  EDUCATION NEEDS:   No education needs identified at this time    Jarome Matin, MS, RD, LDN, [facility] Inpatient Clinical Dietitian Pager # [phone] After hours/weekend pager # [phone]

## 2016-09-05 NOTE — Progress Notes (Addendum)
PULMONARY / CRITICAL CARE MEDICINE   Name: George Burgess MRN: [id number] DOB: 05-03-1950    ADMISSION DATE:  09/04/2016 CONSULTATION DATE:  7/18   REFERRING MD: Jeneen Rinks  CHIEF COMPLAINT:  Hemorrhagic shock   HISTORY OF PRESENT ILLNESS:   This is a 66 year old male w/ sig h/o: CAF (on DOAC), HFrEF (EF 30%), prior CVA, vascular dementia, HTN, diabetes type II,  Bilateral Pleural effusions, PVD, prior Left BKA, chronic LE wounds. Resides at SNF (has no HCPOA). Presented from SNF 6/18 after having 2 large Volume maroon colored stools and BP in 70s. On arrival to ED he was awake. Remained hypotensive w/ SBP in 70s but would increase w/ fluid and blood resuscitation. His initial Hgb was 6.3. He was given K centra, received 2 units of blood In ED but remained hypotensive. Because of this PCCM asked to admit.   PAST MEDICAL HISTORY :  CAF, HFrEF (EF 30%), prior CVA, vascular dementia, HTN, diabetes type II,  Bilateral Pleural effusions, PVD, prior Left BKA, chronic LE wounds.   PAST SURGICAL HISTORY: Left BKA   No Known Allergies  No current facility-administered medications on file prior to encounter.    No current outpatient prescriptions on file prior to encounter.    FAMILY HISTORY:  His has no family status information on file.    SOCIAL HISTORY: He  resides at a SNF No legal guardians on file   REVIEW OF SYSTEMS:   Not able to obtain due to altered mental status  SUBJECTIVE:  Results of CTA and tagged scan noted. No more episodes of blood PR Off neo today AM. Got insulin D50 for elevated K  VITAL SIGNS: BP (!) 86/47 Comment: reassessed  Pulse (!) 115   Temp 97.8 F (36.6 C) (Oral)   Resp 14   Ht 6' (1.829 m)   Wt 162 lb 14.7 oz (73.9 kg)   SpO2 100%   BMI 22.10 kg/m   HEMODYNAMICS:    VENTILATOR SETTINGS:    INTAKE / OUTPUT: I/O last 3 completed shifts: In: 4991 [I.V.:3099.6; Blood:1549; IV Piggyback:342.4] Out: -   PHYSICAL EXAMINATION: Blood pressure  115/67, pulse (!) 115, temperature 97.8 F (36.6 C), temperature source Oral, resp. rate (!) 30, height 6' (1.829 m), weight 162 lb 14.7 oz (73.9 kg), SpO2 100 %. Gen:      No acute distress HEENT:  EOMI, sclera anicteric Neck:     No masses; no thyromegaly Lungs:    Clear to auscultation bilaterally; normal respiratory effort CV:         Regular rate and rhythm; no murmurs Abd:      + bowel sounds; soft, non-tender; no palpable masses, no distension Ext:    Lt BKA Skin:      Warm and dry; no rash Neuro: Awake, confused. No focal deficits  LABS:  BMET  Recent Labs Lab 09/04/16 0627 09/04/16 1127 09/05/16 0304  NA 138 139 139  K 3.9 4.1 5.6*  CL 105 109 111  CO2 23 20* 21*  BUN 28* 28* 35*  CREATININE 2.99* 2.96* 3.43*  GLUCOSE 185* 183* 130*    Electrolytes  Recent Labs Lab 09/04/16 0627 09/04/16 1127 09/05/16 0304  CALCIUM 7.7* 7.2* 6.3*  MG  --  1.5* 1.2*  PHOS  --  3.0 3.2    CBC  Recent Labs Lab 09/04/16 1428 09/04/16 2150 09/05/16 0304  WBC 27.2* 18.9* 16.0*  HGB 7.2* 7.5* 6.7*  HCT 20.2* 20.9* 18.7*  PLT 146* 120*  115*    Coag's  Recent Labs Lab 09/04/16 0627 09/04/16 1428 09/05/16 0304  APTT  --   --  >200*  INR 1.61 1.03 1.33    Sepsis Markers  Recent Labs Lab 09/04/16 0716  LATICACIDVEN 2.38*    ABG  Recent Labs Lab 09/04/16 2125  PHART 7.392  PCO2ART 31.8*  PO2ART 86.0    Liver Enzymes  Recent Labs Lab 09/04/16 0627 09/05/16 0430  AST 22  --   ALT 18  --   ALKPHOS 66  --   BILITOT 0.4  --   ALBUMIN 2.5* 2.6*    Cardiac Enzymes  Recent Labs Lab 09/04/16 1127 09/04/16 1428 09/04/16 2150  TROPONINI 0.04* 0.04* 0.65*    Glucose  Recent Labs Lab 09/04/16 1222 09/04/16 1557 09/04/16 2028 09/04/16 2340 09/05/16 0457 09/05/16 0805  GLUCAP 145* 105* 114* 126* 124* 85    Imaging Nm Gi Blood Loss  Result Date: 09/04/2016 CLINICAL DATA:  Gastrointestinal bleeding this morning, subsiding by  afternoon. EXAM: NUCLEAR MEDICINE GASTROINTESTINAL BLEEDING SCAN TECHNIQUE: Sequential abdominal images were obtained following intravenous administration of Tc-31m labeled red blood cells. RADIOPHARMACEUTICALS:  22 mCi Tc-46m in-vitro labeled red cells. COMPARISON:  None. FINDINGS: A total of 2 hours of imaging of the abdomen and pelvis was performed. There appears to be some activity within the pelvis during the first hour, frame three of the images recorded CT 5 minutes intervals. Similar tracer activity is seen within what may represent the rectosigmoid on the 1 minutes interval images, image 34/60 through 44/60 1 of the images taken during the first hour. IMPRESSION: Subtle tracer activity suggested within the lower pelvis likely within the rectosigmoid that may represent the site of reported gastrointestinal bleeding. Electronically Signed   By: Ashley Royalty M.D.   On: 09/04/2016 19:24   Dg Chest Port 1 View  Result Date: 09/05/2016 CLINICAL DATA:  CHF. EXAM: PORTABLE CHEST 1 VIEW COMPARISON:  09/04/2016. FINDINGS: Interim placement of NG tube, its tip is below left hemidiaphragm. Right IJ dual-lumen catheter in stable position. Cardiomegaly with bilateral interstitial prominence and bilateral pleural effusions consistent with CHF. Bibasilar pneumonia cannot be excluded. No change from prior exam. No pneumothorax. IMPRESSION: 1. Interim placement of NG tube, its tip is below left hemidiaphragm. Right IJ dual-lumen catheter stable position. 2. Congestive heart failure with pulmonary edema and small right pleural effusion. No change from prior exam. Bibasilar pneumonia again cannot be excluded . Electronically Signed   By: Marcello Moores  Register   On: 09/05/2016 07:53   Ct Angio Abdomen Pelvis  W &/or Wo Contrast  Result Date: 09/04/2016 CLINICAL DATA:  66 year old male with multiple medical problems currently anticoagulation on Eliquis with recent GI bleed. EXAM: CT ANGIOGRAPHY ABDOMEN AND PELVIS WITH CONTRAST  AND WITHOUT CONTRAST TECHNIQUE: Multidetector CT imaging of the abdomen and pelvis was performed using the standard protocol during bolus administration of intravenous contrast. Multiplanar reconstructed images and MIPs were obtained and reviewed to evaluate the vascular anatomy. CONTRAST:  100 mL Isovue 370 COMPARISON:  None. FINDINGS: VASCULAR Aorta: Normal caliber aorta without aneurysm, dissection, vasculitis or significant stenosis. Celiac: Patent without evidence of aneurysm, dissection, vasculitis or significant stenosis. SMA: Patent without evidence of aneurysm, dissection, vasculitis or significant stenosis. Renals: 1 right and 2 left renal arteries. No significant stenosis or evidence of fibromuscular dysplasia. IMA: Patent without evidence of aneurysm, dissection, vasculitis or significant stenosis. Inflow: Small penetrating atherosclerotic ulcer arising from the posteroinferior aspect of the right common iliac artery. Significant  stenosis of the origin of the right internal iliac artery. The remaining common, internal and external iliac arteries are patent without evidence of stenosis. Proximal Outflow: Bilateral common femoral and visualized portions of the superficial and profunda femoral arteries are patent without evidence of aneurysm, dissection, vasculitis or significant stenosis. Veins: Right femoral approach central venous catheter with the tip terminating in the right common iliac vein. No acute venous abnormality. Review of the MIP images confirms the above findings. NON-VASCULAR Lower chest: Large right and moderate left layering pleural effusions with associated bibasilar atelectasis. Cardiomegaly. No pericardial effusion. Incompletely imaged nasogastric tube which is coiled in the stomach. Hepatobiliary: Nodular hepatic contour consistent with cirrhosis. No focal lesion identified. Unremarkable gallbladder and biliary system. Pancreas: Unremarkable. No pancreatic ductal dilatation or  surrounding inflammatory changes. Spleen: Normal in size without focal abnormality. Adrenals/Urinary Tract: Unremarkable adrenal glands. Symmetric renal size bilaterally. Small focus of hypoenhancement in the anterior aspect of the left lower renal pole. The ureters are unremarkable. The bladder is diffusely thick walled. Stomach/Bowel: No focal bowel wall thickening or evidence of obstruction. There are several scattered sigmoid colonic diverticular which are moderately high in attenuation. There is no evidence of vascular pooling on the venous phase to suggest active bleeding. Lymphatic: No suspicious lymphadenopathy. Reproductive: Prostatomegaly. Other: Anasarca. No abdominal wall hernia. Mild to moderate ascites. Musculoskeletal: No acute fracture or aggressive appearing lytic or blastic osseous lesion. IMPRESSION: VASCULAR 1. No evidence of active GI bleed at this time. 2. Small penetrating atherosclerotic ulcer arising from the posteroinferior aspect of the right common iliac artery. NON-VASCULAR 1. Focal hypoenhancement in the lower pole of the left kidney may represent a mildly irregular cyst, or perhaps a region of focal pyelonephritis. Recommend clinical correlation with urinalysis to exclude evidence of urinary tract infection. 2. Scattered sigmoid colonic diverticula. 3. Anasarca, large right and moderate left pleural effusions. In the setting of cardiomegaly, these features suggest congestive heart failure versus volume overload versus third-spacing. 4. Morphologic features of hepatic cirrhosis. The presence of ascites may reflect portal hypertension, or be related to the CHF or volume overload described above. Signed, Criselda Peaches, MD [facility] Radiology Electronically Signed   By: Jacqulynn Cadet M.D.   On: 09/04/2016 15:29    STUDIES:    CULTURES:   ANTIBIOTICS:   SIGNIFICANT EVENTS:   LINES/TUBES: Rt femoral CVL >  7/18  DISCUSSION: 66 year old male w/ h/o CVA and vascular dementia. Reported to be "poor historian" and has no HCPOA. Other sig hx: AF on DOAC, HFrEF (EF 30%), HTN, ESRD TTS. Now here with acute GIB  ASSESSMENT / PLAN: Hemorrhagic Shock secondary to lower GIB, On eliquis.  Bleeding appears to have stopped today Plan Continue supportive care Transfuse 2 units PRBC. Keep Hb > 8. Follow CBC Change Protonix gtt to IV q12  H/o Afib and HFrEF (EF 30%) Elevated troponin. Likely demand Plan Rate control as indicated.  Tele Holding metoprolol w/ hypotension No more DOAC in setting of life threatening GIB Holding lasix, lisinopril and Imdur  ESRD (TTS) Hyperkalemia Plan Given insulin, D50. Follow [facility] on board to determine timing of dialysis If we can get hemodynamics stabilized he will transfer to [facility] for HD   H/o diabetes type II w/ hyperglycemia  Plan:   SSI coverage  Acute encephalopathy H/o CVA w/ vascular dementia -not clear what his baseline is. States from [employer] [place] he is a "poor historian" Plan:   Supportive  care Holding narcotics and sedating meds  Severe PVD. W/ venous ulcerations on RLE and  Prior left BKA Plan Would care Cont supportive care  The patient is critically ill with multiple organ system failure and requires high complexity decision making for assessment and support, frequent evaluation and titration of therapies, advanced monitoring, review of radiographic studies and interpretation of complex data.   Critical Care Time devoted to patient care services, exclusive of separately billable procedures, described in this note is 35 minutes.   Marshell Garfinkel MD [facility] Pager [phone] If no answer or after 3pm call: [phone] 09/05/2016, 9:58 AM

## 2016-09-05 NOTE — Progress Notes (Addendum)
CRITICAL VALUE ALERT  Critical Value: troponin 0.65   Date & Time Notied: 09/04/2016 @11 :20pm  Provider Notified: Dr. Mortimer Fries (Margaree Mackintosh)

## 2016-09-05 NOTE — Care Management Note (Signed)
Case Management Note  Patient Details  Name: George Burgess MRN: [id number] Date of Birth: 10-Jun-1950  Subjective/Objective:       Hemorrhagic shock             Action/Plan: Date:  September 05, 2016 Chart reviewed for concurrent status and case management needs. Will continue to follow patient progress. Discharge Planning: following for needs Expected discharge date: [id number] Velva Harman, BSN, [place], [place]  Expected Discharge Date:   (unknown)               Expected Discharge Plan:  Home/Self Care  In-House Referral:     Discharge planning Services  CM Consult  Post Acute Care Choice:    Choice offered to:     DME Arranged:    DME Agency:     HH Arranged:    [place] Agency:     Status of Service:  In process, will continue to follow  If discussed at Long Length of Stay Meetings, dates discussed:    Additional Comments:  Leeroy Cha, RN 09/05/2016, 8:50 AM

## 2016-09-05 NOTE — Progress Notes (Signed)
CRITICAL VALUE ALERT  Critical Value: K 5.6 and Cr 3.43  Date & Time Notied:  09/05/16 0427  Provider Notified: MD [place] (Nephrologist)   Orders Received/Actions taken: Will continue to monitor assess. PCCM asked to report lab values to Nephrology in regards to patient possibly needing dialysis.

## 2016-09-05 NOTE — Progress Notes (Signed)
CRITICAL VALUE ALERT  Critical Value:  Trop 8.67  Date & Time Notied:  09/05/16 - 1940  Provider Notified: Emmit Alexanders, MD

## 2016-09-05 NOTE — Progress Notes (Signed)
[place] Progress Note Patient Name: George Burgess DOB: 03-09-1950 MRN: [id number]   Date of Service  09/05/2016  HPI/Events of Note  Increased HR to 120's-130's - Patient c/o chest pain. EKG reveals sinus tachycardia with PAC's and PVC's, non-specific ST/T wave abnormality. R axis deviation. Demand ischemia? BP = 130/82.   eICU Interventions  Will order: 1. BMP and Mg++ level now.  2. Metoprolol 2.5-5 mg IV Q 3 hours PRN HR > 115. 2. Cycle Troponin.      Intervention Category Major Interventions: Arrhythmia - evaluation and management  Yonis Carreon Eugene 09/05/2016, 4:41 PM

## 2016-09-05 NOTE — Progress Notes (Signed)
  Echocardiogram 2D Echocardiogram has been performed.  George Burgess 09/05/2016, 1:41 PM

## 2016-09-05 NOTE — Progress Notes (Addendum)
eLink Physician-Brief Progress Note Patient Name: George Burgess DOB: 05-11-1950 MRN: [id number]   Date of Service  09/05/2016  HPI/Events of Note  Troponin #1 = 8.67. Spoke with Dr. Radford Pax who feels that given the patient's history of GI bleeding recently, the patient can't be given Heparin IV infusion, ASA or undergo Cardiac Catheterization. Given his renal failure his elevated troponin may well represent demand ischemia alone. Given his current medical state therapeutic options are medical and limited to B-Blockers which he is already on. The patient is hemodynamically stable with a BP = 125/88.   eICU Interventions  Cardiology consulted - They feel that they have very little to add at this point. They will see the patient in the morning unless he deteriorates. Will recheck Hgb now. If Hgb < 8.0 will transfuse PRBC.      Intervention Category Intermediate Interventions: Diagnostic test evaluation  Lysle Dingwall 09/05/2016, 7:44 PM

## 2016-09-05 NOTE — Progress Notes (Signed)
CRITICAL VALUE ALERT  Critical Value:  Calcium 6.3 and Hgb 6.7  Date & Time Notied:09/05/16 @3 :61  Provider Notified: Dr. Mortimer Fries (E[facility])  Orders Received/Actions taken: Patient has dementia and is disoriented to person, place, time, and situation. Unable to give blood consent. No family contact is on file. Only contact is the nursing home and no one is picking up the phone. MD was notified and patient will be reassessed in the morning per order. Patient's current BP is 107/68 (77). Will continue to monitor.

## 2016-09-05 NOTE — Progress Notes (Addendum)
Progress Note   Subjective  Patient underwent CTA yesterday, which was followed by tagged RBC scan last night. There was evidence of suspected bleeding from the rectosigmoid colon on bleeding scan. Nursing reports patient has had no further blood per rectum since yesterday, no bleeding overnight. Hgb has drifted back down, getting another transfusion RBC this AM. Hypotension has improved, possible dialysis today, elevated K noted, treated with insulin. Patient is confused but responsive. Patient denies abdominal pain.   Objective   Vital signs in last 24 hours: Temp:  [96.5 F (35.8 C)-97.9 F (36.6 C)] 97.9 F (36.6 C) (07/19 0016) Pulse Rate:  [34-117] 115 (07/19 0700) Resp:  [11-34] 15 (07/19 0700) BP: (52-136)/(30-79) 101/60 (07/19 0700) SpO2:  [90 %-100 %] 100 % (07/19 0700) Weight:  [161 lb 9.6 oz (73.3 kg)-162 lb 14.7 oz (73.9 kg)] 162 lb 14.7 oz (73.9 kg) (07/19 0500) Last BM Date: 09/04/16 General:    AA male with NG in place Heart:  tachycardic; no murmurs Lungs: relatively clear bilaterally anteriorly Abdomen:  Soft, nontender .  Extremities:  Without edema, wounds on feet noted, bandaged. Neurologic:  Alert, not oritented. Psych:  Cooperative. Normal mood and affect.  Intake/Output from previous day: 07/18 0701 - 07/19 0700 In: 4991 [I.V.:3099.6; Blood:1549; IV Piggyback:342.4] Out: -  Intake/Output this shift: No intake/output data recorded.  Lab Results:  Recent Labs  09/04/16 1428 09/04/16 2150 09/05/16 0304  WBC 27.2* 18.9* 16.0*  HGB 7.2* 7.5* 6.7*  HCT 20.2* 20.9* 18.7*  PLT 146* 120* 115*   BMET  Recent Labs  09/04/16 0627 09/04/16 1127 09/05/16 0304  NA 138 139 139  K 3.9 4.1 5.6*  CL 105 109 111  CO2 23 20* 21*  GLUCOSE 185* 183* 130*  BUN 28* 28* 35*  CREATININE 2.99* 2.96* 3.43*  CALCIUM 7.7* 7.2* 6.3*   LFT  Recent Labs  09/04/16 0627 09/05/16 0430  PROT 4.7*  --   ALBUMIN 2.5* 2.6*  AST 22  --   ALT 18  --     ALKPHOS 66  --   BILITOT 0.4  --    PT/INR  Recent Labs  09/04/16 1428 09/05/16 0304  LABPROT 13.5 16.6*  INR 1.03 1.33    Studies/Results: Nm Gi Blood Loss  Result Date: 09/04/2016 CLINICAL DATA:  Gastrointestinal bleeding this morning, subsiding by afternoon. EXAM: NUCLEAR MEDICINE GASTROINTESTINAL BLEEDING SCAN TECHNIQUE: Sequential abdominal images were obtained following intravenous administration of Tc-39m labeled red blood cells. RADIOPHARMACEUTICALS:  22 mCi Tc-34m in-vitro labeled red cells. COMPARISON:  None. FINDINGS: A total of 2 hours of imaging of the abdomen and pelvis was performed. There appears to be some activity within the pelvis during the first hour, frame three of the images recorded CT 5 minutes intervals. Similar tracer activity is seen within what may represent the rectosigmoid on the 1 minutes interval images, image 34/60 through 44/60 1 of the images taken during the first hour. IMPRESSION: Subtle tracer activity suggested within the lower pelvis likely within the rectosigmoid that may represent the site of reported gastrointestinal bleeding. Electronically Signed   By: Ashley Royalty M.D.   On: 09/04/2016 19:24   Dg Chest Port 1 View  Result Date: 09/04/2016 CLINICAL DATA:  GI bleed.  Possible sepsis . EXAM: PORTABLE CHEST 1 VIEW COMPARISON:  No recent prior . FINDINGS: Right IJ dual-lumen catheter with tip projected over the right atrium. Cardiomegaly with bilateral pulmonary infiltrates consistent with pulmonary edema, right side greater  than. Small right pleural effusion. IMPRESSION: 1. Right IJ dual-lumen catheter with tip projected over right atrium. 2. Congestive heart failure with pulmonary edema, right side greater than left. Basilar pneumonia cannot be excluded. Right-sided pleural effusion. Electronically Signed   By: Marcello Moores  Register   On: 09/04/2016 07:32   Ct Angio Abdomen Pelvis  W &/or Wo Contrast  Result Date: 09/04/2016 CLINICAL DATA:  66 year old  male with multiple medical problems currently anticoagulation on Eliquis with recent GI bleed. EXAM: CT ANGIOGRAPHY ABDOMEN AND PELVIS WITH CONTRAST AND WITHOUT CONTRAST TECHNIQUE: Multidetector CT imaging of the abdomen and pelvis was performed using the standard protocol during bolus administration of intravenous contrast. Multiplanar reconstructed images and MIPs were obtained and reviewed to evaluate the vascular anatomy. CONTRAST:  100 mL Isovue 370 COMPARISON:  None. FINDINGS: VASCULAR Aorta: Normal caliber aorta without aneurysm, dissection, vasculitis or significant stenosis. Celiac: Patent without evidence of aneurysm, dissection, vasculitis or significant stenosis. SMA: Patent without evidence of aneurysm, dissection, vasculitis or significant stenosis. Renals: 1 right and 2 left renal arteries. No significant stenosis or evidence of fibromuscular dysplasia. IMA: Patent without evidence of aneurysm, dissection, vasculitis or significant stenosis. Inflow: Small penetrating atherosclerotic ulcer arising from the posteroinferior aspect of the right common iliac artery. Significant stenosis of the origin of the right internal iliac artery. The remaining common, internal and external iliac arteries are patent without evidence of stenosis. Proximal Outflow: Bilateral common femoral and visualized portions of the superficial and profunda femoral arteries are patent without evidence of aneurysm, dissection, vasculitis or significant stenosis. Veins: Right femoral approach central venous catheter with the tip terminating in the right common iliac vein. No acute venous abnormality. Review of the MIP images confirms the above findings. NON-VASCULAR Lower chest: Large right and moderate left layering pleural effusions with associated bibasilar atelectasis. Cardiomegaly. No pericardial effusion. Incompletely imaged nasogastric tube which is coiled in the stomach. Hepatobiliary: Nodular hepatic contour consistent with  cirrhosis. No focal lesion identified. Unremarkable gallbladder and biliary system. Pancreas: Unremarkable. No pancreatic ductal dilatation or surrounding inflammatory changes. Spleen: Normal in size without focal abnormality. Adrenals/Urinary Tract: Unremarkable adrenal glands. Symmetric renal size bilaterally. Small focus of hypoenhancement in the anterior aspect of the left lower renal pole. The ureters are unremarkable. The bladder is diffusely thick walled. Stomach/Bowel: No focal bowel wall thickening or evidence of obstruction. There are several scattered sigmoid colonic diverticular which are moderately high in attenuation. There is no evidence of vascular pooling on the venous phase to suggest active bleeding. Lymphatic: No suspicious lymphadenopathy. Reproductive: Prostatomegaly. Other: Anasarca. No abdominal wall hernia. Mild to moderate ascites. Musculoskeletal: No acute fracture or aggressive appearing lytic or blastic osseous lesion. IMPRESSION: VASCULAR 1. No evidence of active GI bleed at this time. 2. Small penetrating atherosclerotic ulcer arising from the posteroinferior aspect of the right common iliac artery. NON-VASCULAR 1. Focal hypoenhancement in the lower pole of the left kidney may represent a mildly irregular cyst, or perhaps a region of focal pyelonephritis. Recommend clinical correlation with urinalysis to exclude evidence of urinary tract infection. 2. Scattered sigmoid colonic diverticula. 3. Anasarca, large right and moderate left pleural effusions. In the setting of cardiomegaly, these features suggest congestive heart failure versus volume overload versus third-spacing. 4. Morphologic features of hepatic cirrhosis. The presence of ascites may reflect portal hypertension, or be related to the CHF or volume overload described above. Signed, Criselda Peaches, MD [facility] Radiology Electronically Signed   By: Dellis Filbert.D.  On: 09/04/2016 15:29       Assessment / Plan:   66 y/o male with dementia and ESRD on HD, on Eliquis, history of CVA - presented with severe lower GI bleeding yesterday morning with associated hypotension. NG lavage at bedside initially done and returned normal gastric secretions. He underwent CTA yesterday which was negative, although sigmoid diverticulosis noted. Tagged RBC scan then performed suggestive of bleeding at the rectosigmoid colon. Nursing states he he stopped bleeding yesterday, no further bowel movements overnight. He is arousable and can answer basic questions, but is confused - not sure what his baseline mental status is. He has mild hyperkalemia noted today, treated with insulin, awaiting nephrology input regarding type / timing of dialysis. Of note, incidentally seen on CT are pleural effusions and cardiomegaly. There are also morphologic changes of cirrhosis with ascites - unclear if due to CHF or cirrhosis. The spleen is normal size, mild thrombocytopenia noted. He will need evaluation for possible cirrhosis once we have addressed his bleeding. No varices noted on CT scan.  The patient is hemodynamically improved compared to yesterday, off pressors now. He appears to have stopped bleeding with reversal of Eliquis. Based on his presentation and tagged RBC scan, I suspect he likely had a diverticular bleed. It's unclear if he's ever had a prior colonoscopy.   He warrants an endoscopic evaluation at some point in time, will need to discuss with nephrology timing. To evaluate the rectosigmoid, we can consider an unprepped flex sig today if he is actively bleeding, but if he has stopped bleeding, would prefer to bowel prep through NG and perform colonoscopy when more stable. He is higher risk for anesthesia and may wish to obtain an echocardiogram pre-procedure if he is otherwise stable. We need to try and talk to family to obtain consent if possible, await his PRBC transfusion this  morning otherwise.   Please call with questions or symptoms concerning for rebleeding.  [place]ello Cellar, MD [facility] Gastroenterology Pager [phone]

## 2016-09-06 ENCOUNTER — Encounter (HOSPITAL_COMMUNITY): Payer: Self-pay | Admitting: Nurse Practitioner

## 2016-09-06 DIAGNOSIS — K7460 Unspecified cirrhosis of liver: Secondary | ICD-10-CM

## 2016-09-06 DIAGNOSIS — K922 Gastrointestinal hemorrhage, unspecified: Secondary | ICD-10-CM

## 2016-09-06 DIAGNOSIS — I214 Non-ST elevation (NSTEMI) myocardial infarction: Secondary | ICD-10-CM

## 2016-09-06 LAB — GLUCOSE, CAPILLARY
GLUCOSE-CAPILLARY: 80 mg/dL (ref 65–99)
GLUCOSE-CAPILLARY: 80 mg/dL (ref 65–99)
GLUCOSE-CAPILLARY: 88 mg/dL (ref 65–99)
GLUCOSE-CAPILLARY: 79 mg/dL (ref 65–99)
Glucose-Capillary: 80 mg/dL (ref 65–99)
Glucose-Capillary: 83 mg/dL (ref 65–99)

## 2016-09-06 LAB — BASIC METABOLIC PANEL
ANION GAP: 8 (ref 5–15)
BUN: 25 mg/dL — ABNORMAL HIGH (ref 6–20)
CALCIUM: 8.1 mg/dL — AB (ref 8.9–10.3)
CO2: 25 mmol/L (ref 22–32)
Chloride: 106 mmol/L (ref 101–111)
Creatinine, Ser: 2.57 mg/dL — ABNORMAL HIGH (ref 0.61–1.24)
GFR, EST AFRICAN AMERICAN: 28 mL/min — AB (ref >60)
GFR, EST NON AFRICAN AMERICAN: 24 mL/min — AB (ref >60)
GLUCOSE: 109 mg/dL — AB (ref 65–99)
Potassium: 4.4 mmol/L (ref 3.5–5.1)
Sodium: 139 mmol/L (ref 135–145)

## 2016-09-06 LAB — TROPONIN I: TROPONIN I: 11.8 ng/mL — AB (ref <0.03)

## 2016-09-06 LAB — CBC WITH DIFFERENTIAL/PLATELET
BASOS ABS: 0 10*3/uL (ref 0.0–0.1)
BASOS ABS: 0 10*3/uL (ref 0.0–0.1)
BASOS PCT: 0 %
BASOS PCT: 0 %
EOS ABS: 0.1 10*3/uL (ref 0.0–0.7)
EOS PCT: 1 %
Eosinophils Absolute: 0.1 10*3/uL (ref 0.0–0.7)
Eosinophils Relative: 1 %
HCT: 21.8 % — ABNORMAL LOW (ref 39.0–52.0)
HEMATOCRIT: 23.6 % — AB (ref 39.0–52.0)
HEMOGLOBIN: 8.1 g/dL — AB (ref 13.0–17.0)
HEMOGLOBIN: 7.7 g/dL — AB (ref 13.0–17.0)
Lymphocytes Relative: 7 %
Lymphocytes Relative: 4 %
Lymphs Abs: 1 10*3/uL (ref 0.7–4.0)
Lymphs Abs: 0.6 10*3/uL — ABNORMAL LOW (ref 0.7–4.0)
MCH: 29 pg (ref 26.0–34.0)
MCH: 30 pg (ref 26.0–34.0)
MCHC: 34.3 g/dL (ref 30.0–36.0)
MCHC: 35.3 g/dL (ref 30.0–36.0)
MCV: 84.6 fL (ref 78.0–100.0)
MCV: 84.8 fL (ref 78.0–100.0)
MONOS PCT: 6 %
Monocytes Absolute: 0.9 10*3/uL (ref 0.1–1.0)
Monocytes Absolute: 1.2 10*3/uL — ABNORMAL HIGH (ref 0.1–1.0)
Monocytes Relative: 8 %
NEUTROS ABS: 12.2 10*3/uL — AB (ref 1.7–7.7)
NEUTROS PCT: 86 %
NEUTROS PCT: 87 %
Neutro Abs: 12.6 10*3/uL — ABNORMAL HIGH (ref 1.7–7.7)
PLATELETS: 117 10*3/uL — AB (ref 150–400)
Platelets: 125 10*3/uL — ABNORMAL LOW (ref 150–400)
RBC: 2.79 MIL/uL — AB (ref 4.22–5.81)
RBC: 2.57 MIL/uL — AB (ref 4.22–5.81)
RDW: 16.2 % — ABNORMAL HIGH (ref 11.5–15.5)
RDW: 16.2 % — ABNORMAL HIGH (ref 11.5–15.5)
WBC: 14.2 10*3/uL — AB (ref 4.0–10.5)
WBC: 14.5 10*3/uL — AB (ref 4.0–10.5)

## 2016-09-06 LAB — CBC
HCT: 23.2 % — ABNORMAL LOW (ref 39.0–52.0)
Hemoglobin: 8.2 g/dL — ABNORMAL LOW (ref 13.0–17.0)
MCH: 29.7 pg (ref 26.0–34.0)
MCHC: 35.3 g/dL (ref 30.0–36.0)
MCV: 84.1 fL (ref 78.0–100.0)
PLATELETS: 110 10*3/uL — AB (ref 150–400)
RBC: 2.76 MIL/uL — ABNORMAL LOW (ref 4.22–5.81)
RDW: 16 % — AB (ref 11.5–15.5)
WBC: 14.9 10*3/uL — AB (ref 4.0–10.5)

## 2016-09-06 LAB — PHOSPHORUS: Phosphorus: 3.2 mg/dL (ref 2.5–4.6)

## 2016-09-06 LAB — MAGNESIUM: MAGNESIUM: 1.9 mg/dL (ref 1.7–2.4)

## 2016-09-06 MED ORDER — METOPROLOL TARTRATE 12.5 MG HALF TABLET
12.5000 mg | ORAL_TABLET | Freq: Two times a day (BID) | ORAL | Status: DC
  Administered 2016-09-06 – 2016-09-09 (×6): 12.5 mg via ORAL
  Filled 2016-09-06 (×7): qty 1

## 2016-09-06 MED ORDER — ATORVASTATIN CALCIUM 80 MG PO TABS
80.0000 mg | ORAL_TABLET | Freq: Every day | ORAL | Status: DC
  Administered 2016-09-06 – 2016-09-13 (×7): 80 mg via ORAL
  Filled 2016-09-06: qty 2
  Filled 2016-09-06 (×3): qty 1
  Filled 2016-09-06: qty 2
  Filled 2016-09-06 (×2): qty 1

## 2016-09-06 NOTE — Progress Notes (Signed)
PULMONARY / CRITICAL CARE MEDICINE   Name: George Burgess MRN: [id number] DOB: 06-15-1950    ADMISSION DATE:  09/04/2016 CONSULTATION DATE:  7/18   REFERRING MD: Jeneen Rinks  CHIEF COMPLAINT:  Hemorrhagic shock   HISTORY OF PRESENT ILLNESS:   This is a 66 year old male w/ sig h/o: CAF (on DOAC), HFrEF (EF 30%), prior CVA, vascular dementia, HTN, diabetes type II,  Bilateral Pleural effusions, PVD, prior Left BKA, chronic LE wounds. Resides at SNF (has no HCPOA). Presented from SNF 6/18 after having 2 large Volume maroon colored stools and BP in 70s. On arrival to ED he was awake. Remained hypotensive w/ SBP in 70s but would increase w/ fluid and blood resuscitation. His initial Hgb was 6.3. He was given K centra, received 2 units of blood In ED but remained hypotensive. Because of this PCCM asked to admit.   SUBJECTIVE: . No more episodes of blood PR since 7/19 Off neo today AM.  CVVH started 7/19 at 1500 Has received a total of 4 U PRBC  VITAL SIGNS: BP (!) 136/98   Pulse (!) 106   Temp (!) 95.7 F (35.4 C) (Axillary) Comment: blood warmer applied  Resp 14   Ht 6' (1.829 m)   Wt 162 lb 14.7 oz (73.9 kg)   SpO2 100%   BMI 22.10 kg/m   HEMODYNAMICS:    VENTILATOR SETTINGS:    INTAKE / OUTPUT: I/O last 3 completed shifts: In: 1521.7 [P.O.:15; I.V.:886.7; Blood:620] Out: 1103 [Other:1174]  PHYSICAL EXAMINATION: Blood pressure 115/67, pulse (!) 115, temperature 97.8 F (36.6 C), temperature source Oral, resp. rate (!) 30, height 6' (1.829 m), weight 162 lb 14.7 oz (73.9 kg), SpO2 100 %. Gen:      No acute distress, confused, hypothermic HEENT:  EOMI, sclera anicteric, poor dentation Neck:     No masses; no thyromegaly, no JVD, or bruits Lungs:    Clear throughout, respirations regular and unlabored CV:         Regular rate and rhythm; tachy per monitor,S1, S2, + Systolic murmur, No RG Abd:      + bowel sounds ; soft, non-tender; non-distended no palpable masses Ext:    Lt  BKA, foam dressing intact, no clubbing cyanosis, edema Skin:      Warm and dry, intact, ; no rash or lesions noted Neuro: Awake, confused.Disoriented to place and time, MAE x 4,  No focal deficits  LABS:  BMET  Recent Labs Lab 09/05/16 0304 09/05/16 1523 09/06/16 0330  NA 139 140  138 139  K 5.6* 4.5  4.4 4.4  CL 111 111  108 106  CO2 21* 20*  20* 25  BUN 35* 35*  35* 25*  CREATININE 3.43* 3.65*  3.71* 2.57*  GLUCOSE 130* 117*  115* 109*    Electrolytes  Recent Labs Lab 09/05/16 0304 09/05/16 1523 09/05/16 1642 09/06/16 0330  CALCIUM 6.3* 7.9*  7.9*  --  8.1*  MG 1.2*  --  1.6* 1.9  PHOS 3.2 3.4  --  3.2    CBC  Recent Labs Lab 09/05/16 0926 09/05/16 1523 09/06/16 0330  WBC 16.2* 16.8* 14.9*  HGB 6.5* 6.3* 8.2*  HCT 18.3* 17.7* 23.2*  PLT 117* 126* 110*    Coag's  Recent Labs Lab 09/04/16 0627 09/04/16 1428 09/05/16 0304  APTT  --   --  >200*  INR 1.61 1.03 1.33    Sepsis Markers  Recent Labs Lab 09/04/16 0716  LATICACIDVEN 2.38*    ABG  Recent Labs Lab 09/04/16 2125  PHART 7.392  PCO2ART 31.8*  PO2ART 86.0    Liver Enzymes  Recent Labs Lab 09/04/16 0627 09/05/16 0430 09/05/16 1523  AST 22  --   --   ALT 18  --   --   ALKPHOS 66  --   --   BILITOT 0.4  --   --   ALBUMIN 2.5* 2.6* 2.9*    Cardiac Enzymes  Recent Labs Lab 09/05/16 1642 09/05/16 2252 09/06/16 0330  TROPONINI 8.67* 9.70* 11.80*    Glucose  Recent Labs Lab 09/05/16 1342 09/05/16 1547 09/05/16 1930 09/05/16 2307 09/06/16 0343 09/06/16 0742  GLUCAP 93 91 124* 94 80 80    Imaging No results found.  STUDIES:  09/04/2016>>CTA>>No evidence of active GI bleed at this time. Small penetrating atherosclerotic ulcer arising from the posteroinferior aspect of the right common iliac artery. 09/04/2016>>Blood Pool scan>>Subtle tracer activity suggested within the lower pelvis likely within the rectosigmoid that may represent the site of  reported gastrointestinal bleeding.  CULTURES: 09/04/2016>> Blood  ANTIBIOTICS:   SIGNIFICANT EVENTS: 09/04/2016>> Admit 09/05/2016>> CVVH initiated until stabilized and able to transfer to [facility] for HD  LINES/TUBES: Rt femoral CVL > 7/18 R HD cath >> 7/19  DISCUSSION: 66 year old male w/ h/o CVA and vascular dementia. Reported to be "poor historian" and has no HCPOA. Other sig hx: AF on DOAC, HFrEF (EF 30%), HTN, ESRD TTS. Now here with acute GIB  ASSESSMENT / PLAN: Hemorrhagic Shock secondary to lower GIB, On eliquis.  No bleeding since 7/19 Plan Continue supportive care Transfuse PRBC. To  Keep Hb > 8. CBC at 1600 today  Trend  CBC daily Protonix IV q12 Monitor for blood loss  H/o Afib and HFrEF (EF 30%) Elevated troponin.>> 11.8  NSTEMI>>  7/19 in setting of GIB/ profound anemia Plan Cardiology consult>> appreciate input Rate control as indicated.  Tele monitoring Hypotension resolved>> resume low dose beta blocker If tolerates BB plan to add long acting nitrate next Add statin  Therapy in setting of ACS No more DOAC in setting of life threatening GIB Holding lasix, lisinopril and Imdur  ESRD (TTS) Hyperkalemia Anuric CVVH  Plan Appreciate renal assistance CRRT initiated 09/05/2016 Net negative as hemodynamics tolerate Transfer to [facility] for HD when hemodynamically stable  Pulmonary: No Acute Issues 3L [place] Plan: Titrate oxygen to Maintain saturations > 93% CXR prn Aggressive Pulmonary Toilet and Mobilize when stable and able  Infection Hypothermia>> ? 2/2 CVVHD without blood warmer Leukocytosis  Afebrile Plan Trend CBC and WBC Trend fever Follow Blood Culture micro and sensitivities Re-culture if appears clinically infectious    H/o diabetes type II w/ hyperglycemia  Plan:  CBG Q 4  SSI coverage  Acute encephalopathy H/o CVA w/ vascular dementia -not clear what his baseline is. States from dc summary from [place] he is a "poor  historian"/ ? dementia Plan:   Supportive care Holding narcotics and sedating meds Frequent re-orientation Lights on during the day, and off at night  Severe PVD. W/ venous ulcerations on RLE and  Prior left BKA Plan Would care Cont supportive care  Pt. Updated at bedside. No family available 7/20 am. CC  APP time 30 minutes   Magdalen Spatz, AGACNP-BC [facility]  Pager [phone] 09/06/2016, 10:32 AM

## 2016-09-06 NOTE — Progress Notes (Signed)
Hgb 7.7, down from previous level of 8.1 @ 1600. [place] physician notified by elink RN Elzie Rings. No new orders given. VSS. Will continue to monitor.

## 2016-09-06 NOTE — Progress Notes (Signed)
NUTRITION NOTE  Pt seen for full assessment by this RD yesterday with associated note at 10:03 AM. Pt started on CRRT yesterday. NGT is now out and diet advanced to CLD. Spoke with RN who reports he ordered a tray a short time ago, but that it has not yet arrived.    RD will continue to follow per protocol.    Jarome Matin, MS, RD, LDN, [facility] Inpatient Clinical Dietitian Pager # [phone] After hours/weekend pager # [phone]

## 2016-09-06 NOTE — Progress Notes (Signed)
No further bleeding at this time.  Transfused yesterday and hgb in the 8s this am.  Had NSTEMI overnight.  No need for IR intervention at this time.  Please reorder if the need arises.  Sahan Pen E 9:29 AM 09/06/2016

## 2016-09-06 NOTE — Consult Note (Signed)
Cardiology Consult    Patient ID: George Burgess MRN: [id number], DOB/AGE: [birth year]   Admit date: 09/04/2016 Date of Consult: 09/06/2016  Primary Physician: Caprice Renshaw, MD Primary Cardiologist: new - seen by J. Mayrene Bastarache, MD  Requesting Provider: Bernita Raisin, MD  Patient Profile    George Burgess is a 66 y.o. male with a history of afib, combined chf, cardiomyopathy (? ICM vs NICM), ESRD on HD, HTN, PVD s/p L BKA, DM, Stroke, and dementia, who is being seen today for the evaluation of chest pain, elevated HR, and NSTEMI at the request of Dr. Vaughan Browner.  Past Medical History   Past Medical History:  Diagnosis Date  . Atrial fibrillation ([place])    a. Chronic Eliquis (CHA2DS2VASc = 6).  . Chronic combined systolic (congestive) and diastolic (congestive) heart failure ([place])    a. Previously reported EF of 30%;  b. 08/2016 Echo: EF 40-45%, antsept, inf, infsept HK, Gr3 DD, mod AI/MR, sev dil LA, mild TR, PASP 25mmHg.  Marland Kitchen Dementia   . Encephalopathy   . End stage renal disease ([place])    a. On HD.  Marland Kitchen Essential hypertension   . GIB (gastrointestinal bleeding)    a. 08/2016 in setting of Eliquis Rx.  . Pleural effusion   . PVD (peripheral vascular disease) ([place])    a. s/p L BKA;  b. Chronic LE wounds.  . Stroke ([place])   . Type II diabetes mellitus ([place])     Past Surgical History:  Procedure Laterality Date  . BELOW KNEE LEG AMPUTATION Left      Allergies  No Known Allergies  History of Present Illness    66 year old male with the above complex past medical history including atrial fibrillation on chronic eliquis therapy, chronic combined systolic and diastolic congestive heart failure, cardiomyopathy with an EF of 40-45%, hypertension, diabetes, peripheral vascular disease status post left BKA, chronic lower extremity wounds, stroke, and vascular dementia. Dementia limits patient's ability to provide an adequate history. He resides at [facility] facility and receives  dialysis 3 times a week. He says he has been on dialysis for a long time but doesn't know just how long. When asked if he has a cardiologist, he thinks he might but he isn't sure. He is currently disoriented to time and place and has no complaints. Per history, he was admitted on July 18 from [facility] after having 2 large volume maroon colored stools and was noted to be hypotensive. He was taken to [facility] emergency department where hemoglobin was 6.3 and blood pressure was in the 70s. He was treated with 2 units of packed red blood cells and admitted for further evaluation. He was seen by GI and nasogastric lavage returned gastric secretions without blood. This was followed by CT angiography with did not show evidence of active bleeding. Follow-up CBC was initially stable. He continued to have dark stools and therefore he was seen by interventional radiology with plan for tagged RBC study. This was performed and showed subtle tracer activity within the lower pelvis likely within the rectosigmoid. Hemoglobin dropped again to 6.3 on July, and he was treated with 2 additional units of packed red blood cells.  Throughout his admission, he has been in what appears to be an atrial tachycardia with rates in the low 100s.  He does have a reported history of atrial fibrillation and is on chronic eliquis therapy. In the setting of GI bleed, eliquis has been on hold. The setting of hypotension,  other home medications including He wa beta blocker, ACE inhibitor, nitrate, and Lasix have been on hold. Echocardiogram on July 18 showed an EF of 40-45% with anteroseptal, inferior, inferoseptal hypokinesis and grade 3 diastolic dysfunction. On the afternoon of July 19, he complained of chest pain. EKG was nonacute. Heart rates to elevate into the 120s and 130s. He was treated with IV metoprolol and troponins were sent off and subsequently returned elevated, initially at 9.67 but subsequently rose to 11.80. Patient  currently denies any chest pain or shortness of breath and is currently receiving dialysis.   Inpatient Medications    . Chlorhexidine Gluconate Cloth  6 each Topical Daily  . heparin lock flush  500 Units Intracatheter Once  . insulin aspart  0-9 Units Subcutaneous Q4H  . pantoprazole  40 mg Intravenous Q12H  . sodium chloride flush  10-40 mL Intracatheter Q12H    Family History    Family History  Problem Relation Age of Onset  . Other Mother        Pt unsure of PMH of family members.    Social History    Social History   Social History  . Marital status: Single    Spouse name: N/A  . Number of children: N/A  . Years of education: N/A   Occupational History  . retired/disabled    Social History Main Topics  . Smoking status: Former Research scientist (life sciences)  . Smokeless tobacco: Not on file     Comment: Pt thinks he used to smoke cigarettes.  Thinks he quit many years ago.  . Alcohol use No  . Drug use: No  . Sexual activity: No   Other Topics Concern  . Not on file   Social History Narrative   Lives @ [facility].  Sedentary.     Review of Systems    General:  No chills, fever, night sweats or weight changes.  Cardiovascular:  +++ chest pain on 7/19, no reported h/o dyspnea on exertion, edema, orthopnea, palpitations, paroxysmal nocturnal dyspnea. Dermatological: No rash, lesions/masses Respiratory: No cough, dyspnea Urologic: No hematuria, dysuria Abdominal:   No nausea, vomiting, diarrhea, bright red blood per rectum, melena, or hematemesis Neurologic:  No visual changes, wkns, changes in mental status. All other systems reviewed and are otherwise negative except as noted above.  Physical Exam    Blood pressure (!) 150/91, pulse (!) 107, temperature (!) 97.4 F (36.3 C), temperature source Oral, resp. rate (!) 21, height 6' (1.829 m), weight 162 lb 14.7 oz (73.9 kg), SpO2 99 %.  General: Pleasant, NAD Psych: Normal affect. Neuro: Disoriented to time/place. Moves all  extremities spontaneously. HEENT: Normal  Neck: Supple without bruits or JVD. Lungs:  Resp regular and unlabored, CTA. Heart: RRR, mildly tachycardic, no s3, s4, 2/6 syst murmur @ apex. Abdomen: Soft, non-tender, non-distended, BS + x 4.  Extremities: No clubbing, cyanosis or edema. L BKA.  Labs    Troponin [facility] Test)  Recent Labs  09/04/16 0701  TROPIPOC 0.04    Recent Labs  09/04/16 2150 09/05/16 1642 09/05/16 2252 09/06/16 0330  TROPONINI 0.65* 8.67* 9.70* 11.80*   Lab Results  Component Value Date   WBC 14.9 (H) 09/06/2016   HGB 8.2 (L) 09/06/2016   HCT 23.2 (L) 09/06/2016   MCV 84.1 09/06/2016   PLT 110 (L) 09/06/2016    Recent Labs Lab 09/04/16 0627  09/06/16 0330  NA 138  < > 139  K 3.9  < > 4.4  CL 105  < >  106  CO2 23  < > 25  BUN 28*  < > 25*  CREATININE 2.99*  < > 2.57*  CALCIUM 7.7*  < > 8.1*  PROT 4.7*  --   --   BILITOT 0.4  --   --   ALKPHOS 66  --   --   ALT 18  --   --   AST 22  --   --   GLUCOSE 185*  < > 109*  < > = values in this interval not displayed.   Radiology Studies    Nm Gi Blood Loss  Result Date: 09/04/2016 CLINICAL DATA:  Gastrointestinal bleeding this morning, subsiding by afternoon. EXAM: NUCLEAR MEDICINE GASTROINTESTINAL BLEEDING SCAN TECHNIQUE: Sequential abdominal images were obtained following intravenous administration of Tc-68m labeled red blood cells. RADIOPHARMACEUTICALS:  22 mCi Tc-33m in-vitro labeled red cells. COMPARISON:  None. FINDINGS: A total of 2 hours of imaging of the abdomen and pelvis was performed. There appears to be some activity within the pelvis during the first hour, frame three of the images recorded CT 5 minutes intervals. Similar tracer activity is seen within what may represent the rectosigmoid on the 1 minutes interval images, image 34/60 through 44/60 1 of the images taken during the first hour. IMPRESSION: Subtle tracer activity suggested within the lower pelvis likely within the  rectosigmoid that may represent the site of reported gastrointestinal bleeding. Electronically Signed   By: Ashley Royalty M.D.   On: 09/04/2016 19:24   Dg Chest Port 1 View  Result Date: 09/05/2016 CLINICAL DATA:  CHF. EXAM: PORTABLE CHEST 1 VIEW COMPARISON:  09/04/2016. FINDINGS: Interim placement of NG tube, its tip is below left hemidiaphragm. Right IJ dual-lumen catheter in stable position. Cardiomegaly with bilateral interstitial prominence and bilateral pleural effusions consistent with CHF. Bibasilar pneumonia cannot be excluded. No change from prior exam. No pneumothorax. IMPRESSION: 1. Interim placement of NG tube, its tip is below left hemidiaphragm. Right IJ dual-lumen catheter stable position. 2. Congestive heart failure with pulmonary edema and small right pleural effusion. No change from prior exam. Bibasilar pneumonia again cannot be excluded . Electronically Signed   By: Marcello Moores  Register   On: 09/05/2016 07:53   Dg Chest Port 1 View  Result Date: 09/04/2016 CLINICAL DATA:  GI bleed.  Possible sepsis . EXAM: PORTABLE CHEST 1 VIEW COMPARISON:  No recent prior . FINDINGS: Right IJ dual-lumen catheter with tip projected over the right atrium. Cardiomegaly with bilateral pulmonary infiltrates consistent with pulmonary edema, right side greater than. Small right pleural effusion. IMPRESSION: 1. Right IJ dual-lumen catheter with tip projected over right atrium. 2. Congestive heart failure with pulmonary edema, right side greater than left. Basilar pneumonia cannot be excluded. Right-sided pleural effusion. Electronically Signed   By: Marcello Moores  Register   On: 09/04/2016 07:32   Ct Angio Abdomen Pelvis  W &/or Wo Contrast  Result Date: 09/04/2016 CLINICAL DATA:  66 year old male with multiple medical problems currently anticoagulation on Eliquis with recent GI bleed. EXAM: CT ANGIOGRAPHY ABDOMEN AND PELVIS WITH CONTRAST AND WITHOUT CONTRAST TECHNIQUE: Multidetector CT imaging of the abdomen and pelvis  was performed using the standard protocol during bolus administration of intravenous contrast. Multiplanar reconstructed images and MIPs were obtained and reviewed to evaluate the vascular anatomy. CONTRAST:  100 mL Isovue 370 COMPARISON:  None. FINDINGS: VASCULAR Aorta: Normal caliber aorta without aneurysm, dissection, vasculitis or significant stenosis. Celiac: Patent without evidence of aneurysm, dissection, vasculitis or significant stenosis. SMA: Patent without evidence of aneurysm, dissection,  vasculitis or significant stenosis. Renals: 1 right and 2 left renal arteries. No significant stenosis or evidence of fibromuscular dysplasia. IMA: Patent without evidence of aneurysm, dissection, vasculitis or significant stenosis. Inflow: Small penetrating atherosclerotic ulcer arising from the posteroinferior aspect of the right common iliac artery. Significant stenosis of the origin of the right internal iliac artery. The remaining common, internal and external iliac arteries are patent without evidence of stenosis. Proximal Outflow: Bilateral common femoral and visualized portions of the superficial and profunda femoral arteries are patent without evidence of aneurysm, dissection, vasculitis or significant stenosis. Veins: Right femoral approach central venous catheter with the tip terminating in the right common iliac vein. No acute venous abnormality. Review of the MIP images confirms the above findings. NON-VASCULAR Lower chest: Large right and moderate left layering pleural effusions with associated bibasilar atelectasis. Cardiomegaly. No pericardial effusion. Incompletely imaged nasogastric tube which is coiled in the stomach. Hepatobiliary: Nodular hepatic contour consistent with cirrhosis. No focal lesion identified. Unremarkable gallbladder and biliary system. Pancreas: Unremarkable. No pancreatic ductal dilatation or surrounding inflammatory changes. Spleen: Normal in size without focal abnormality.  Adrenals/Urinary Tract: Unremarkable adrenal glands. Symmetric renal size bilaterally. Small focus of hypoenhancement in the anterior aspect of the left lower renal pole. The ureters are unremarkable. The bladder is diffusely thick walled. Stomach/Bowel: No focal bowel wall thickening or evidence of obstruction. There are several scattered sigmoid colonic diverticular which are moderately high in attenuation. There is no evidence of vascular pooling on the venous phase to suggest active bleeding. Lymphatic: No suspicious lymphadenopathy. Reproductive: Prostatomegaly. Other: Anasarca. No abdominal wall hernia. Mild to moderate ascites. Musculoskeletal: No acute fracture or aggressive appearing lytic or blastic osseous lesion. IMPRESSION: VASCULAR 1. No evidence of active GI bleed at this time. 2. Small penetrating atherosclerotic ulcer arising from the posteroinferior aspect of the right common iliac artery. NON-VASCULAR 1. Focal hypoenhancement in the lower pole of the left kidney may represent a mildly irregular cyst, or perhaps a region of focal pyelonephritis. Recommend clinical correlation with urinalysis to exclude evidence of urinary tract infection. 2. Scattered sigmoid colonic diverticula. 3. Anasarca, large right and moderate left pleural effusions. In the setting of cardiomegaly, these features suggest congestive heart failure versus volume overload versus third-spacing. 4. Morphologic features of hepatic cirrhosis. The presence of ascites may reflect portal hypertension, or be related to the CHF or volume overload described above. Signed, Criselda Peaches, MD [facility] Radiology Electronically Signed   By: Jacqulynn Cadet M.D.   On: 09/04/2016 15:29    ECG & Cardiac Imaging    Probable atrial tachycardia at 118, nonspecific T changes.  Assessment & Plan    1. Non-STEMI: Patient presented July 18 in the setting of GI bleed and profound anemia  with hemoglobin of 6.3 requiring a total of 4 units packed red blood cells since admission. Initial troponin was mildly elevated 0.65. Patient developed chest discomfort and elevated heart rates on July 19. Follow-up troponin was elevated at 8.67 has subsequently risen to 11.80. Patient is currently chest pain-free. Patient has no recollection of yesterday's events and is not sure about his prior cardiac history. An echocardiogram performed this admission showed an EF of 40-45%, which is better than previously reported EF of 30%. He is known to have peripheral vascular disease along with risk factors including diabetes and hypertension, thus making it very likely that he has coronary artery disease. Given advanced dementia, lack of active symptoms, and active GI bleeding, George Burgess will be best served with conservative management. He is not a candidate for anticoagulation or antiplatelet therapy at this time. As his blood pressure appears to have stabilized, I will resume low-dose beta blocker, and high potency statin therapy.  If he tolerates  blocker, will look to resume long-acting nitrate next.  2. Essential hypertension: Patient was initially hypotensive in the setting of significant anemia. Blood pressures have stabilized. Resume low-dose beta blocker.  3. Lipid status. Currently unknown. Adding high potency statin therapy in the setting of ACS.  4. GI bleed: Patient is been seen by gastroenterology and interventional radiology. Nuclear medicine GI blood loss study suggestive of possible rectosigmoid bleed. Further evaluation per GI/IR.  5. Acute blood loss anemia: Hemoglobin and hematocrit stable this morning. He has received a total of 4 units of packed red blood cells.  6. End-stage renal disease: Dialysis per nephrology. He is currently tolerating this without chest pain or dyspnea.   7. History of atrial fibrillation/flutter/atrial tachycardia: Patient's telemetry and EKG generally shows a  regular rhythm in the low 100s. By zooming in on his V lead on telemetry, I was able to clearly see atrial activity that marches out at a rate of about 200 bpm.  Resume  blocker.  Cont to hold [place] in setting of above.   Signed, Murray Hodgkins, NP 09/06/2016, 8:27 AM  History and all data above reviewed.  Patient examined.  I agree with the findings as above.  I agree with the data above.  The patient is not oriented to place and time.  he denies any pain.    The patient exam reveals COR:RRR  ,  Lungs: Decreased breath sounds  ,  Abd: Decreased bowel sounds with distended abdomen, Ext status post amputation with decreased pulses but no edema  .  All available labs, radiology testing, previous records reviewed. Agree with documented assessment and plan. NSTEMI:  Not a candidate for invasive or non invasive evaluation.  He is not uncomfortable and has no acute decline in his EF.  Beta blocker started as above and   George Burgess  11:17 AM  09/06/2016

## 2016-09-06 NOTE — Progress Notes (Signed)
  [facility] Progress Note   Assessment/ Plan:   Assessment/Plan: 1 Hemorrhagic shock 2/2 acute GIB: likely diverticular  Transfuse PRN.  Per PCCM 2. NSTEMI: likely demand from ABLA.  Cardiology following. 2. Afib on Eliquis: received KCentra, got  FEIBA. No longer candidate for DOAC 3 ESRD: TTS.  Pt doesn't know where his HD center is.  Will contact nursing facility to see if they can tell me. Stop CRRT.  Will need transfer to [facility] for HD. 4 Volume: not grossly vol overloaded 5. Anemia of ESRD/ ABLA: getting blood products, will give iron and ESA as appropriate 6 Metabolic Bone Disease: will place on binders and VDRA as appropriate  Subjective:    Hypotension improving.  Hgb 8.2, appears that pt no longer bleeding   Objective:   BP 125/80   Pulse (!) 108   Temp (!) 97.4 F (36.3 C) (Axillary)   Resp 17   Ht 6' (1.829 m)   Wt 73.9 kg (162 lb 14.7 oz)   SpO2 100%   BMI 22.10 kg/m   Physical Exam: GEN cachectic, lying in bed HEENT sclerae anicteric NECK no JVD PULM poor air movement bilaterally, no diffuse crackles CV tachycardic, no m/r/g ABD nontender GU: no more BRBPR EXT s/p L BKA, R leg with 2 ulcers, dressed on the dorsum of the foot and the R shin.  R foot is cool  NEURO nonfocal ACCESS: R IJ permcath  Labs: BMET  Recent Labs Lab 09/04/16 0627 09/04/16 1127 09/05/16 0304 09/05/16 1523 09/06/16 0330  NA 138 139 139 140  138 139  K 3.9 4.1 5.6* 4.5  4.4 4.4  CL 105 109 111 111  108 106  CO2 23 20* 21* 20*  20* 25  GLUCOSE 185* 183* 130* 117*  115* 109*  BUN 28* 28* 35* 35*  35* 25*  CREATININE 2.99* 2.96* 3.43* 3.65*  3.71* 2.57*  CALCIUM 7.7* 7.2* 6.3* 7.9*  7.9* 8.1*  PHOS  --  3.0 3.2 3.4 3.2   CBC  Recent Labs Lab 09/04/16 0627  09/05/16 0304 09/05/16 0926 09/05/16 1523 09/06/16 0330  WBC 11.8*  < > 16.0* 16.2* 16.8* 14.9*  NEUTROABS 9.0*  --   --   --   --   --   HGB 6.3*  < > 6.7* 6.5* 6.3* 8.2*  HCT 18.7*  < >  18.7* 18.3* 17.7* 23.2*  MCV 87.8  < > 84.2 84.7 84.7 84.1  PLT 210  < > 115* 117* 126* 110*  < > = values in this interval not displayed.  @IMGRELPRIORS @ Medications:    . atorvastatin  80 mg Oral q1800  . Chlorhexidine Gluconate Cloth  6 each Topical Daily  . heparin lock flush  500 Units Intracatheter Once  . insulin aspart  0-9 Units Subcutaneous Q4H  . metoprolol tartrate  12.5 mg Oral BID  . pantoprazole  40 mg Intravenous Q12H  . sodium chloride flush  10-40 mL Intracatheter Q12H    Madelon Lips MD [facility] pgr [phone] 09/06/2016, 1:25 PM

## 2016-09-06 NOTE — Progress Notes (Signed)
Pt having bloody stools.  Called [facility] and informed Dr. Oletta Darter.  See orders. Irven Baltimore, RN

## 2016-09-06 NOTE — Progress Notes (Signed)
[facility] Progress Note  Subjective:  No further bleeding in almost 48 hours.  On CRRT.  Hgb 8.2 grams this AM.  Objective:  Vital signs in last 24 hours: Temp:  [97.4 F (36.3 C)-97.6 F (36.4 C)] 97.4 F (36.3 C) (07/20 0400) Pulse Rate:  [91-123] 104 (07/20 0800) Resp:  [13-36] 14 (07/20 0800) BP: (96-154)/(27-107) 120/80 (07/20 0800) SpO2:  [78 %-100 %] 100 % (07/20 0800) Weight:  [162 lb 14.7 oz (73.9 kg)] 162 lb 14.7 oz (73.9 kg) (07/20 0500) Last BM Date: 09/04/16 General:  Alert, Well-developed, in NAD Heart:  Regular rate and rhythm Pulm:  CTAB.  No increased WOB. Abdomen:  Soft, non-distended.  BS present.  Non-tender. Extremities:  Left BKA. Neurologic:  Alert but confused;  grossly normal neurologically.  Intake/Output from previous day: 07/19 0701 - 07/20 0700 In: 1201.7 [P.O.:15; I.V.:566.7; Blood:620] Out: 1174  Intake/Output this shift: Total I/O In: 20 [I.V.:20] Out: 131 [Other:131]  Lab Results:  Recent Labs  09/05/16 0926 09/05/16 1523 09/06/16 0330  WBC 16.2* 16.8* 14.9*  HGB 6.5* 6.3* 8.2*  HCT 18.3* 17.7* 23.2*  PLT 117* 126* 110*   BMET  Recent Labs  09/05/16 0304 09/05/16 1523 09/06/16 0330  NA 139 140  138 139  K 5.6* 4.5  4.4 4.4  CL 111 111  108 106  CO2 21* 20*  20* 25  GLUCOSE 130* 117*  115* 109*  BUN 35* 35*  35* 25*  CREATININE 3.43* 3.65*  3.71* 2.57*  CALCIUM 6.3* 7.9*  7.9* 8.1*   LFT  Recent Labs  09/04/16 0627  09/05/16 1523  PROT 4.7*  --   --   ALBUMIN 2.5*  < > 2.9*  AST 22  --   --   ALT 18  --   --   ALKPHOS 66  --   --   BILITOT 0.4  --   --   < > = values in this interval not displayed. PT/INR  Recent Labs  09/04/16 1428 09/05/16 0304  LABPROT 13.5 16.6*  INR 1.03 1.33   Hepatitis Panel No results for input(s): HEPBSAG, HCVAB, HEPAIGM, HEPBIGM in the last 72 hours.  Nm Gi Blood Loss  Result Date: 09/04/2016 CLINICAL DATA:  Gastrointestinal bleeding this  morning, subsiding by afternoon. EXAM: NUCLEAR MEDICINE GASTROINTESTINAL BLEEDING SCAN TECHNIQUE: Sequential abdominal images were obtained following intravenous administration of Tc-62m labeled red blood cells. RADIOPHARMACEUTICALS:  22 mCi Tc-44m in-vitro labeled red cells. COMPARISON:  None. FINDINGS: A total of 2 hours of imaging of the abdomen and pelvis was performed. There appears to be some activity within the pelvis during the first hour, frame three of the images recorded CT 5 minutes intervals. Similar tracer activity is seen within what may represent the rectosigmoid on the 1 minutes interval images, image 34/60 through 44/60 1 of the images taken during the first hour. IMPRESSION: Subtle tracer activity suggested within the lower pelvis likely within the rectosigmoid that may represent the site of reported gastrointestinal bleeding. Electronically Signed   By: Ashley Royalty M.D.   On: 09/04/2016 19:24   Dg Chest Port 1 View  Result Date: 09/05/2016 CLINICAL DATA:  CHF. EXAM: PORTABLE CHEST 1 VIEW COMPARISON:  09/04/2016. FINDINGS: Interim placement of NG tube, its tip is below left hemidiaphragm. Right IJ dual-lumen catheter in stable position. Cardiomegaly with bilateral interstitial prominence and bilateral pleural effusions consistent with CHF. Bibasilar pneumonia cannot be excluded. No change from prior exam. No pneumothorax.  IMPRESSION: 1. Interim placement of NG tube, its tip is below left hemidiaphragm. Right IJ dual-lumen catheter stable position. 2. Congestive heart failure with pulmonary edema and small right pleural effusion. No change from prior exam. Bibasilar pneumonia again cannot be excluded . Electronically Signed   By: Marcello Moores  Register   On: 09/05/2016 07:53   Ct Angio Abdomen Pelvis  W &/or Wo Contrast  Result Date: 09/04/2016 CLINICAL DATA:  66 year old male with multiple medical problems currently anticoagulation on Eliquis with recent GI bleed. EXAM: CT ANGIOGRAPHY ABDOMEN  AND PELVIS WITH CONTRAST AND WITHOUT CONTRAST TECHNIQUE: Multidetector CT imaging of the abdomen and pelvis was performed using the standard protocol during bolus administration of intravenous contrast. Multiplanar reconstructed images and MIPs were obtained and reviewed to evaluate the vascular anatomy. CONTRAST:  100 mL Isovue 370 COMPARISON:  None. FINDINGS: VASCULAR Aorta: Normal caliber aorta without aneurysm, dissection, vasculitis or significant stenosis. Celiac: Patent without evidence of aneurysm, dissection, vasculitis or significant stenosis. SMA: Patent without evidence of aneurysm, dissection, vasculitis or significant stenosis. Renals: 1 right and 2 left renal arteries. No significant stenosis or evidence of fibromuscular dysplasia. IMA: Patent without evidence of aneurysm, dissection, vasculitis or significant stenosis. Inflow: Small penetrating atherosclerotic ulcer arising from the posteroinferior aspect of the right common iliac artery. Significant stenosis of the origin of the right internal iliac artery. The remaining common, internal and external iliac arteries are patent without evidence of stenosis. Proximal Outflow: Bilateral common femoral and visualized portions of the superficial and profunda femoral arteries are patent without evidence of aneurysm, dissection, vasculitis or significant stenosis. Veins: Right femoral approach central venous catheter with the tip terminating in the right common iliac vein. No acute venous abnormality. Review of the MIP images confirms the above findings. NON-VASCULAR Lower chest: Large right and moderate left layering pleural effusions with associated bibasilar atelectasis. Cardiomegaly. No pericardial effusion. Incompletely imaged nasogastric tube which is coiled in the stomach. Hepatobiliary: Nodular hepatic contour consistent with cirrhosis. No focal lesion identified. Unremarkable gallbladder and biliary system. Pancreas: Unremarkable. No pancreatic  ductal dilatation or surrounding inflammatory changes. Spleen: Normal in size without focal abnormality. Adrenals/Urinary Tract: Unremarkable adrenal glands. Symmetric renal size bilaterally. Small focus of hypoenhancement in the anterior aspect of the left lower renal pole. The ureters are unremarkable. The bladder is diffusely thick walled. Stomach/Bowel: No focal bowel wall thickening or evidence of obstruction. There are several scattered sigmoid colonic diverticular which are moderately high in attenuation. There is no evidence of vascular pooling on the venous phase to suggest active bleeding. Lymphatic: No suspicious lymphadenopathy. Reproductive: Prostatomegaly. Other: Anasarca. No abdominal wall hernia. Mild to moderate ascites. Musculoskeletal: No acute fracture or aggressive appearing lytic or blastic osseous lesion. IMPRESSION: VASCULAR 1. No evidence of active GI bleed at this time. 2. Small penetrating atherosclerotic ulcer arising from the posteroinferior aspect of the right common iliac artery. NON-VASCULAR 1. Focal hypoenhancement in the lower pole of the left kidney may represent a mildly irregular cyst, or perhaps a region of focal pyelonephritis. Recommend clinical correlation with urinalysis to exclude evidence of urinary tract infection. 2. Scattered sigmoid colonic diverticula. 3. Anasarca, large right and moderate left pleural effusions. In the setting of cardiomegaly, these features suggest congestive heart failure versus volume overload versus third-spacing. 4. Morphologic features of hepatic cirrhosis. The presence of ascites may reflect portal hypertension, or be related to the CHF or volume overload described above. Signed, Criselda Peaches, MD [facility] Electronically Signed  By: Jacqulynn Cadet M.D.   On: 09/04/2016 15:29   Assessment / Plan: -GIB:  Suspect lower bleed/diverticular.  Bleeding scan suggestive of  bleeding from rectosigmoid region, but bleeding has stopped for essentially almost 48 hours now after discontinuation and reversal of Eliquis. -ALBA:  Has received 4 units PRBC's and Hgb seems to be responding well since bleeding has resolved.   -Cirrhosis by imaging:  Will check Hep B and C viral studies. -ESRD:  On HD.  Renal consulting and patient know on CRRT. -NSTEMI:  Occurred overnight.  Cardiology consulting. -Vascular dementia:  Poor historian.  No family has been reached after multiple attempts by staff.  *Patient will try clear liquids.  At some point we should plan to try to prep him for a colonoscopy, but timing will need to be discussed in the setting of NSTEMI, renal issues, etc.   LOS: 2 days   Draken Farrior D.  09/06/2016, 9:21 AM  Pager number [phone]

## 2016-09-07 ENCOUNTER — Inpatient Hospital Stay (HOSPITAL_COMMUNITY): Payer: Medicare Other

## 2016-09-07 ENCOUNTER — Encounter (HOSPITAL_COMMUNITY): Payer: Self-pay

## 2016-09-07 ENCOUNTER — Encounter (HOSPITAL_COMMUNITY)
Admission: EM | Disposition: A | Discharge status: Skilled Nursing Facility | Payer: Self-pay | Source: Home / Self Care | Attending: Pulmonary Disease

## 2016-09-07 DIAGNOSIS — H547 Unspecified visual loss: Secondary | ICD-10-CM

## 2016-09-07 DIAGNOSIS — Z4659 Encounter for fitting and adjustment of other gastrointestinal appliance and device: Secondary | ICD-10-CM

## 2016-09-07 DIAGNOSIS — K625 Hemorrhage of anus and rectum: Secondary | ICD-10-CM

## 2016-09-07 DIAGNOSIS — K921 Melena: Secondary | ICD-10-CM

## 2016-09-07 DIAGNOSIS — R710 Precipitous drop in hematocrit: Secondary | ICD-10-CM

## 2016-09-07 HISTORY — PX: FLEXIBLE SIGMOIDOSCOPY: SHX5431

## 2016-09-07 LAB — CBC WITH DIFFERENTIAL/PLATELET
Basophils Absolute: 0 10*3/uL (ref 0.0–0.1)
Basophils Relative: 0 %
EOS ABS: 0.2 10*3/uL (ref 0.0–0.7)
Eosinophils Relative: 2 %
HCT: 21.2 % — ABNORMAL LOW (ref 39.0–52.0)
HEMOGLOBIN: 7.2 g/dL — AB (ref 13.0–17.0)
LYMPHS ABS: 0.7 10*3/uL (ref 0.7–4.0)
LYMPHS PCT: 5 %
MCH: 29.4 pg (ref 26.0–34.0)
MCHC: 34 g/dL (ref 30.0–36.0)
MCV: 86.5 fL (ref 78.0–100.0)
Monocytes Absolute: 1.2 10*3/uL — ABNORMAL HIGH (ref 0.1–1.0)
Monocytes Relative: 9 %
NEUTROS PCT: 84 %
Neutro Abs: 11 10*3/uL — ABNORMAL HIGH (ref 1.7–7.7)
Platelets: 118 10*3/uL — ABNORMAL LOW (ref 150–400)
RBC: 2.45 MIL/uL — AB (ref 4.22–5.81)
RDW: 16.4 % — ABNORMAL HIGH (ref 11.5–15.5)
WBC: 13 10*3/uL — AB (ref 4.0–10.5)

## 2016-09-07 LAB — BASIC METABOLIC PANEL
Anion gap: 7 (ref 5–15)
BUN: 25 mg/dL — AB (ref 6–20)
CHLORIDE: 105 mmol/L (ref 101–111)
CO2: 25 mmol/L (ref 22–32)
Calcium: 8.1 mg/dL — ABNORMAL LOW (ref 8.9–10.3)
Creatinine, Ser: 2.88 mg/dL — ABNORMAL HIGH (ref 0.61–1.24)
GFR calc Af Amer: 25 mL/min — ABNORMAL LOW (ref >60)
GFR calc non Af Amer: 21 mL/min — ABNORMAL LOW (ref >60)
GLUCOSE: 88 mg/dL (ref 65–99)
POTASSIUM: 4.4 mmol/L (ref 3.5–5.1)
Sodium: 137 mmol/L (ref 135–145)

## 2016-09-07 LAB — GLUCOSE, CAPILLARY
Glucose-Capillary: 76 mg/dL (ref 65–99)
Glucose-Capillary: 87 mg/dL (ref 65–99)
Glucose-Capillary: 75 mg/dL (ref 65–99)
Glucose-Capillary: 76 mg/dL (ref 65–99)
Glucose-Capillary: 94 mg/dL (ref 65–99)

## 2016-09-07 LAB — HEPATITIS C ANTIBODY (REFLEX): HCV Ab: <0.1 s/co ratio (ref 0.0–0.9)

## 2016-09-07 LAB — HEPATITIS B SURFACE ANTIGEN: Hepatitis B Surface Ag: NEGATIVE

## 2016-09-07 LAB — HEPATITIS B SURFACE ANTIBODY, QUANTITATIVE: Hepatitis B-Post: <3.1 m[IU]/mL — ABNORMAL LOW (ref >9.9)

## 2016-09-07 LAB — HCV COMMENT:

## 2016-09-07 LAB — PREPARE RBC (CROSSMATCH)

## 2016-09-07 SURGERY — SIGMOIDOSCOPY, FLEXIBLE
Anesthesia: Moderate Sedation

## 2016-09-07 MED ORDER — SODIUM CHLORIDE 0.9 % IV SOLN: 100.0000 mL | INTRAVENOUS | Status: DC | PRN

## 2016-09-07 MED ORDER — SODIUM CHLORIDE 0.9 % IV SOLN
Freq: Once | INTRAVENOUS | Status: AC
  Administered 2016-09-07: 10:00:00 via INTRAVENOUS

## 2016-09-07 MED ORDER — ALTEPLASE 2 MG IJ SOLR
2.0000 mg | Freq: Once | INTRAMUSCULAR | Status: DC | PRN
  Filled 2016-09-07: qty 2

## 2016-09-07 MED ORDER — CEFTRIAXONE SODIUM 1 G IJ SOLR
1.0000 g | Freq: Every day | INTRAMUSCULAR | Status: DC
  Administered 2016-09-07 – 2016-09-09 (×3): 1 g via INTRAVENOUS
  Filled 2016-09-07 (×3): qty 10

## 2016-09-07 MED ORDER — HEPARIN SODIUM (PORCINE) 1000 UNIT/ML DIALYSIS
1000.0000 [IU] | INTRAMUSCULAR | Status: DC | PRN
  Filled 2016-09-07: qty 1

## 2016-09-07 MED ORDER — STROKE: EARLY STAGES OF RECOVERY BOOK
Freq: Once | Status: AC
Start: 2016-09-07 — End: 2016-09-07
  Administered 2016-09-07: 23:00:00
  Filled 2016-09-07: qty 1

## 2016-09-07 MED ORDER — LIDOCAINE-PRILOCAINE 2.5-2.5 % EX CREA: 1.0000 "application " | TOPICAL_CREAM | CUTANEOUS | Status: DC | PRN

## 2016-09-07 MED ORDER — PENTAFLUOROPROP-TETRAFLUOROETH EX AERO: 1.0000 "application " | INHALATION_SPRAY | CUTANEOUS | Status: DC | PRN

## 2016-09-07 MED ORDER — LIDOCAINE HCL (PF) 1 % IJ SOLN: 5.0000 mL | INTRAMUSCULAR | Status: DC | PRN

## 2016-09-07 NOTE — Progress Notes (Signed)
GI UPDATE:  Unprepped, unsedated endoscopy performed - ended up being able to complete a full colonoscopy, patient tolerated it very well. No blood in the colon appreciated, only brown stool. Significant pancolonic diverticulosis, worst in the sigmoid colon. I suspect he likely had a distal sigmoid colon diverticular bleed given his tagged RBC scan to cause his symptoms, no evidence of active bleeding. Formal procedure note to follow.  [place] Cellar, MD [facility] Pager [phone]

## 2016-09-07 NOTE — H&P (View-Only) (Signed)
PULMONARY / CRITICAL CARE MEDICINE   Name: George Burgess MRN: [id number] DOB: December 08, 1950    ADMISSION DATE:  09/04/2016 CONSULTATION DATE:  7/18   REFERRING MD: Jeneen Rinks  CHIEF COMPLAINT:  Hemorrhagic shock   HISTORY OF PRESENT ILLNESS:   This is a 66 year old male w/ sig h/o: CAF (on DOAC), HFrEF (EF 30%), prior CVA, vascular dementia, HTN, diabetes type II,  Bilateral Pleural effusions, PVD, prior Left BKA, chronic LE wounds. Resides at SNF (has no HCPOA). Presented from SNF 6/18 after having 2 large Volume maroon colored stools and BP in 70s. On arrival to ED he was awake. Remained hypotensive w/ SBP in 70s but would increase w/ fluid and blood resuscitation. His initial Hgb was 6.3. He was given K centra, received 2 units of blood In ED but remained hypotensive. Because of this PCCM asked to admit.   SUBJECTIVE: . Passed bloody bowel movements again overnight Plan for flex sig today Complains of inability to see Remains confused   VITAL SIGNS: BP 115/69   Pulse (!) 110   Temp (!) 97 F (36.1 C) (Axillary)   Resp (!) 9   Ht 6' (1.829 m)   Wt 73.6 kg (162 lb 4.1 oz)   SpO2 100%   BMI 22.01 kg/m   HEMODYNAMICS:    VENTILATOR SETTINGS:    INTAKE / OUTPUT: I/O last 3 completed shifts: In: 420 [I.V.:30; Blood:390] Out: 1294 [Urine:260; Other:1034]  PHYSICAL EXAMINATION: Blood pressure 115/67, pulse (!) 115, temperature 97.8 F (36.6 C), temperature source Oral, resp. rate (!) 30, height 6' (1.829 m), weight 162 lb 14.7 oz (73.9 kg), SpO2 100 %.  General:  Resting comfortably in bed HENT: NCAT OP clear, PERRL PULM: CTA B, normal effort CV: Irreg irreg, no mgr GI: BS+, soft, nontender MSK: normal bulk and tone Neuro: awake, alert, no distress, MAEW, has a pupillary light response but no eye response to threat, corneal reflex intact, otherwise moves all four extremities well   LABS:  BMET  Recent Labs Lab 09/05/16 1523 09/06/16 0330 09/07/16 0405  NA 140   138 139 137  K 4.5  4.4 4.4 4.4  CL 111  108 106 105  CO2 20*  20* 25 25  BUN 35*  35* 25* 25*  CREATININE 3.65*  3.71* 2.57* 2.88*  GLUCOSE 117*  115* 109* 88    Electrolytes  Recent Labs Lab 09/05/16 0304 09/05/16 1523 09/05/16 1642 09/06/16 0330 09/07/16 0405  CALCIUM 6.3* 7.9*  7.9*  --  8.1* 8.1*  MG 1.2*  --  1.6* 1.9  --   PHOS 3.2 3.4  --  3.2  --     CBC  Recent Labs Lab 09/06/16 1600 09/06/16 2050 09/07/16 0400  WBC 14.2* 14.5* 13.0*  HGB 8.1* 7.7* 7.2*  HCT 23.6* 21.8* 21.2*  PLT 125* 117* 118*    Coag's  Recent Labs Lab 09/04/16 0627 09/04/16 1428 09/05/16 0304  APTT  --   --  >200*  INR 1.61 1.03 1.33    Sepsis Markers  Recent Labs Lab 09/04/16 0716  LATICACIDVEN 2.38*    ABG  Recent Labs Lab 09/04/16 2125  PHART 7.392  PCO2ART 31.8*  PO2ART 86.0    Liver Enzymes  Recent Labs Lab 09/04/16 0627 09/05/16 0430 09/05/16 1523  AST 22  --   --   ALT 18  --   --   ALKPHOS 66  --   --   BILITOT 0.4  --   --  ALBUMIN 2.5* 2.6* 2.9*    Cardiac Enzymes  Recent Labs Lab 09/05/16 1642 09/05/16 2252 09/06/16 0330  TROPONINI 8.67* 9.70* 11.80*    Glucose  Recent Labs Lab 09/06/16 1146 09/06/16 1549 09/06/16 1926 09/06/16 2315 09/07/16 0324 09/07/16 0747  GLUCAP 83 80 88 79 76 87    Imaging Dg Chest Port 1 View  Result Date: 09/07/2016 CLINICAL DATA:  Respiratory failure EXAM: PORTABLE CHEST 1 VIEW COMPARISON:  09/05/2016 FINDINGS: Chronic cardiopericardial enlargement. Dialysis catheter from the right with tips at the right atrium. A nasogastric tube has been removed. Haziness of the chest from pleural fluid, possibly moderate on the right. No pneumothorax or air bronchogram. IMPRESSION: Cardiomegaly with pulmonary edema and pleural effusions. No change from 2 days prior. Electronically Signed   By: Monte Fantasia M.D.   On: 09/07/2016 07:15    STUDIES:  09/04/2016>>CTA>>No evidence of active GI bleed  at this time. Small penetrating atherosclerotic ulcer arising from the posteroinferior aspect of the right common iliac artery. 09/04/2016>>Blood Pool scan>>Subtle tracer activity suggested within the lower pelvis likely within the rectosigmoid that may represent the site of reported gastrointestinal bleeding.  CULTURES: 09/04/2016 Blood >>  ANTIBIOTICS: 7/21 ceftriaxone>   SIGNIFICANT EVENTS: 09/04/2016>> Admit 09/05/2016>> CVVH initiated until stabilized and able to transfer to [facility] for HD> 7/20 CVVHD held  LINES/TUBES: Rt femoral CVL > 7/18 R HD cath >> 7/19  DISCUSSION: 67 year old male with a complicated past medical history here with a lower GI bleed in the setting of anticoagulation use.  He has ESRD, CHF.  Continues to bleed as of 7/20.  He has been confused throughout his hospital stay but complains of blindness on 7/21 which he reports started 7/20 in the evening.  ddx PRES vs acute stroke (watershed vs embolic)  ASSESSMENT / PLAN: Lower GI bleed: hemodynamically stable but still bleeding as of 7/20  Plan Transfuse 1 U PRBC now Goal Hgb > 8gm/dL Flex sig today per GI to localize bleeding NPO Embolization if re-bleeds  Afib Chronic systolic heart failure NSTEMI from GI bleeding   Plan Treat bleeding Keep hemodynamically stable No anticoagulants F/u with cardiology Restart home BP meds when not bleeding  ESRD Plan HD per renal Move to [facility] for intermittent HD  Pulmonary: No acute issues Plan: Monitor O2 saturation  Infection At risk for SBP with cirrhosis and GI bleeding Plan Ceftriaxone for SBP prophylaxis  DM2 with hyperglycemia Plan:  SSI  Dementia History of CVA Acute blindness 7/20> 7/21, physical exam supports this, ddx PRES vs watershed vs embolic stroke? Outside window for acute intervention and not an anticoagulant candidate Plan:   MRI brain Neurology consult Hold sedating meds  Severe PVD. W/ venous ulcerations on RLE and   Prior left BKA Plan Would care Cont supportive care  No family bedside  My cc time managing multiple acute medical issues, discussing plan with consultants, managing medications, blood products 33 minutes  Move to [facility] for intermittent hemodialysis  Roselie Awkward, MD [place] [facility] Pager: [phone] Cell: [phone] After 3pm or if no response, call [phone]   09/07/2016, 9:04 AM

## 2016-09-07 NOTE — Progress Notes (Signed)
[facility] PCCM  Case discussed with Dr. Havery Moros.  Mr. Gongora needs an endoscopy today given ongoing GI bleeding.  However he cannot give consent given his dementia and no family is available.  I agree that we should proceed with the endoscopy given the severity of this situation.Roselie Awkward, MD [place] [facility] Pager: [phone] Cell: [phone] After 3pm or if no response, call [phone]

## 2016-09-07 NOTE — Consult Note (Signed)
Neurology Consultation Reason for Consult: Blindness Referring Physician: Lake Bells  CC: Blindness  History is obtained from: Patient, chart  HPI: George Burgess is a 66 y.o. male with a history of vascular dementia who presents with hypotensive shock due to GI bleed. He has had some confusion since being hospitalized and today was noted that he was unable to see. It was confirmed with the nursing home that this is not his baseline. He states that it is been going on "for a while" but is unable to give me a better estimate.  On admission, his blood pressures were in the 60s, this is felt to be secondary to GI bleeding, radiotracer uptake suggested the rectosigmoid area, colonoscopy did not reveal any active bleeding. Gastrologist is suspecting diverticular bleed.  He was on CRT, but this is been discontinued and he is going to be transferred over to come for intermittent hemodialysis.   LKW: Unclear tpa given?: no, unclear last known well  ROS: A 14 point ROS was performed and is negative except as noted in the HPI.   Past Medical History:  Diagnosis Date  . Atrial fibrillation ([place])    a. Chronic Eliquis (CHA2DS2VASc = 6).  . Chronic combined systolic (congestive) and diastolic (congestive) heart failure ([place])    a. Previously reported EF of 30%;  b. 08/2016 Echo: EF 40-45%, antsept, inf, infsept HK, Gr3 DD, mod AI/MR, sev dil LA, mild TR, PASP 72mmHg.  Marland Kitchen Dementia   . Encephalopathy   . End stage renal disease ([place])    a. On HD.  Marland Kitchen Essential hypertension   . GIB (gastrointestinal bleeding)    a. 08/2016 in setting of Eliquis Rx.  . Pleural effusion   . PVD (peripheral vascular disease) ([place])    a. s/p L BKA;  b. Chronic LE wounds.  . Stroke ([place])   . Type II diabetes mellitus ([facility])       Family History  Problem Relation Age of Onset  . Other Mother        Pt unsure of PMH of family members.     Social History:  reports that he has quit smoking. He has never used  smokeless tobacco. He reports that he does not drink alcohol or use drugs.   Exam: Current vital signs: BP (!) 147/98   Pulse (!) 109   Temp 99.7 F (37.6 C) (Axillary)   Resp 16   Ht 6' (1.829 m)   Wt 73.6 kg (162 lb 4.1 oz)   SpO2 100%   BMI 22.01 kg/m  Vital signs in last 24 hours: Temp:  [97 F (36.1 C)-99.7 F (37.6 C)] 99.7 F (37.6 C) (07/21 1600) Pulse Rate:  [55-112] 109 (07/21 1700) Resp:  [9-23] 16 (07/21 1800) BP: (91-172)/(42-107) 147/98 (07/21 1800) SpO2:  [92 %-100 %] 100 % (07/21 1700) Weight:  [73.6 kg (162 lb 4.1 oz)] 73.6 kg (162 lb 4.1 oz) (07/21 0500)   Physical Exam  Constitutional: Appears well-developed and well-nourished.  Psych: Affect appropriate to situation Eyes: No clear intraocular hemorrhage HENT: No OP obstrucion Head: Normocephalic.  Cardiovascular: Normal rate and regular rhythm.  Respiratory: Effort normal and breath sounds normal to anterior ascultation GI: Soft.  No distension. There is no tenderness.  EXT: Left BKA   Neuro: Mental Status: Patient is awake, oriented to person only, he gives the year as "[age] - [age]..... I mean [age] something" in the month is February Cranial Nerves: II: He does not blink to threat, indicates that he  sees light perception only Pupils are equal, round, and reactive to light.   III,IV, VI: He is able to look to the right or left to command V: Facial sensation is symmetric to temperature VII: Facial movement is with mild left decreased nasolabial fold VIII: hearing is intact to voice X: Uvula elevates symmetrically XI: Shoulder shrug is symmetric. XII: tongue is midline without atrophy or fasciculations.  Motor: He has mild 4+/5 weakness of the left arm compared to the right, he states this is normal for him. Sensory: Sensation is symmetric to light touch and temperature in the arms and legs. Cerebellar: He is able to perform due to cognitively being unable to understand what I was asking  him to do, but no clear ataxia.  I have reviewed labs in epic and the results pertinent to this consultation are: Include a 9.6  I have reviewed the images obtained: CT head - bioccipital infarcts   Impression: 66 year old male with bilateral occipital infarctions. I suspect that this is due to either posterior circulation insufficiency with hypertension versus a posterior circulation embolus in the setting of atrial fibrillation without anticoagulation. He will need vascular imaging.   Recommendations: 1. HgbA1c, fasting lipid panel 2. MRI brain 3. Frequent neuro checks 4. Echocardiogram 5. CTA head and neck.  6. Prophylactic therapy-Antiplatelet med: Aspirin - dose 325mg  PO or 300mg  PR once cleared by GI.  7. Risk factor modification 8. Telemetry monitoring 9. PT consult, OT consult, Speech consult 10. please page stroke NP  Or  PA  Or MD  from 8am -4 pm as this patient will be followed by the stroke team at this point.   You can look them up on [email or web address]      Roland Rack, MD [facility] [phone]  If 7pm- 7am, please page neurology on call as listed in [place].

## 2016-09-07 NOTE — Op Note (Signed)
[facility] Patient Name: George Burgess Procedure Date: 09/07/2016 MRN: [id number] Attending MD: George Burgess.  MD, MD Date of Birth: 05-Mar-1950 CSN: [id number] Age: 66 Admit Type: Inpatient Procedure:                Colonoscopy Indications:              Rectal bleeding - CT angio negative, tagged RBC                            previously positive in rectosigmoid colon, patient                            has had recent NSTEMI and rebleeding, unprepped /                            unsedated lower endoscopy today Providers:                George Burgess.  MD, MD Referring MD:              Medicines:                None Complications:            No immediate complications. Estimated blood loss:                            None. Estimated Blood Loss:     Estimated blood loss: none. Procedure:                Pre-Anesthesia Assessment:                           - Prior to the procedure, a History and Physical                            was performed, and patient medications and                            allergies were reviewed. The patient's tolerance of                            previous anesthesia was also reviewed. The risks                            and benefits of the procedure and the sedation                            options and risks were discussed with the patient.                            All questions were answered, and informed consent                            was obtained. Prior Anticoagulants: The patient has                            taken no  previous anticoagulant or antiplatelet                            agents. ASA Grade Assessment: III - A patient with                            severe systemic disease. After reviewing the risks                            and benefits, the patient was deemed in                            satisfactory condition to undergo the procedure.                           After obtaining informed consent, the  colonoscope                            was passed under direct vision. Throughout the                            procedure, the patient's blood pressure, pulse, and                            oxygen saturations were monitored continuously. The                            colonoscopy was performed without difficulty. The                            patient tolerated the procedure well. The quality                            of the bowel preparation was fair. The ileocecal                            valve, appendiceal orifice, and rectum were                            photographed. The EC-[id number] ([id number]) scope was                            introduced through the anus and advanced to the the                            cecum, identified by appendiceal orifice and                            ileocecal valve. Scope In: 2:01:10 PM Scope Out: 2:23:30 PM Scope Withdrawal Time: 0 hours 17 minutes 5 seconds  Total Procedure Duration: 0 hours 22 minutes 20 seconds  Findings:      The perianal and digital rectal examinations were normal.      Many small and large-mouthed diverticula were found in the entire colon,  highest burden in the sigmoid colon      A moderate amount of semi-liquid stool was found in the entire colon,       making visualization difficult. Lavage of the area was performed using       copious amounts of sterile water, resulting in clearance with fair       visualization.      The bowel prep was inadequate for screening purposes, but no large       polyps or mass lesions noted. Brown stool was noted in the entire colon.       No blood appreciated. The exam was otherwise without abnormality. Of       note, intially intended for flex sig however the patient tolerated the       procedure quite well and verbally consented to complete colonoscopy       during the procedure given he tolerated it so well and his preparation       was adequate to do so. Impression:               -  Preparation of the colon was fair - brown stool                            noted throughout, no blood appreciated.                           - Diverticulosis in the entire examined colon,                            highest burden in the left colon.                           - The examination was otherwise normal.                           Overall, I suspect he has had distal sigmoid                            diverticular bleed, enema removed all the blood                            from the colon, no blood appreciated anywhere or                            other pathology to cause his hematochezia, which                            has stopped. Moderate Sedation:      no sedation Recommendation:           - Return patient to ICU for ongoing care.                           - Clear liquid diet, advance as tolerated                           - Continue present medications.                           -  GI service will follow Procedure Code(s):        --- Professional ---                           [phone], Colonoscopy, flexible; diagnostic, including                            collection of specimen(s) by brushing or washing,                            when performed (separate procedure) Diagnosis Code(s):        --- Professional ---                           K62.5, Hemorrhage of anus and rectum                           K57.30, Diverticulosis of large intestine without                            perforation or abscess without bleeding CPT copyright 2016 [employer] Association. All rights reserved. The codes documented in this report are preliminary and upon coder review may  be revised to meet current compliance requirements. George Lipps P.  MD, MD 09/07/2016 4:58:23 PM This report has been signed electronically. Number of Addenda: 0

## 2016-09-07 NOTE — Progress Notes (Signed)
[facility] Progress Note   Assessment/ Plan:   Assessment/Plan: 1 Hemorrhagic shock 2/2 acute GIB: likely diverticular  Transfuse PRN.  Per PCCM and GI.  Initially had stopped but had more BRBPR last night.  Getting flex sig. 2. NSTEMI: likely demand from ABLA.  Cardiology following. 2. Afib on Eliquis: received KCentra, got  FEIBA. No longer candidate for DOAC 3 ESRD: TTS.  [facility].  Getting records.  Getting transferred to [facility] today via [place].  Will likely have to provide HD off schedule tomorrow and then back to TTS. 4 Volume: not grossly vol overloaded but has pleural effusions.  Will try to challenge EDW but may be limited by recurrent bleeding 5. Anemia of ESRD/ ABLA: getting blood products, will give iron and ESA as appropriate 6 Metabolic Bone Disease: will place on binders and VDRA as appropriate 7.  Blindness--> this is new.  Verified by RN at [facility] today, he was not blind before.  Ideally need an MRI but cannot verify that he doesn't have metallic objects in body  Subjective:    More BRBPR overnight   Objective:   BP 129/74   Pulse (!) 110   Temp 97.8 F (36.6 C) (Oral)   Resp 13   Ht 6' (1.829 m)   Wt 73.6 kg (162 lb 4.1 oz)   SpO2 100%   BMI 22.01 kg/m   Physical Exam: GEN cachectic, lying in bed HEENT sclerae anicteric.  Can no longer make eye contact with me; per PCCM doesn't blink to threat NECK no JVD PULM poor air movement bilaterally, no diffuse crackles CV tachycardic, no m/r/g ABD nontender GU: no more BRBPR EXT s/p L BKA, R leg with 2 ulcers, dressed on the dorsum of the foot and the R shin.  R foot is cool  NEURO nonfocal ACCESS: R IJ permcath  Labs: BMET  Recent Labs Lab 09/04/16 0627 09/04/16 1127 09/05/16 0304 09/05/16 1523 09/06/16 0330 09/07/16 0405  NA 138 139 139 140  138 139 137  K 3.9 4.1 5.6* 4.5  4.4 4.4 4.4  CL 105 109 111 111  108 106 105  CO2 23 20* 21* 20*  20* 25 25  GLUCOSE  185* 183* 130* 117*  115* 109* 88  BUN 28* 28* 35* 35*  35* 25* 25*  CREATININE 2.99* 2.96* 3.43* 3.65*  3.71* 2.57* 2.88*  CALCIUM 7.7* 7.2* 6.3* 7.9*  7.9* 8.1* 8.1*  PHOS  --  3.0 3.2 3.4 3.2  --    CBC  Recent Labs Lab 09/04/16 0627  09/06/16 0330 09/06/16 1600 09/06/16 2050 09/07/16 0400  WBC 11.8*  < > 14.9* 14.2* 14.5* 13.0*  NEUTROABS 9.0*  --   --  12.2* 12.6* 11.0*  HGB 6.3*  < > 8.2* 8.1* 7.7* 7.2*  HCT 18.7*  < > 23.2* 23.6* 21.8* 21.2*  MCV 87.8  < > 84.1 84.6 84.8 86.5  PLT 210  < > 110* 125* 117* 118*  < > = values in this interval not displayed.  @IMGRELPRIORS @ Medications:    . atorvastatin  80 mg Oral q1800  . Chlorhexidine Gluconate Cloth  6 each Topical Daily  . heparin lock flush  500 Units Intracatheter Once  . insulin aspart  0-9 Units Subcutaneous Q4H  . metoprolol tartrate  12.5 mg Oral BID  . pantoprazole  40 mg Intravenous Q12H  . sodium chloride flush  10-40 mL Intracatheter Q12H    Madelon Lips MD [facility]  pgr [phone] 09/07/2016, 11:22 AM

## 2016-09-07 NOTE — Progress Notes (Signed)
Progress Note   Subjective  Patient had recurrent blood per rectum - nursing reports about 4 bloody BMs between 5 and 6 PM last night, nothing further since that time. His Hgb dropped slightly today to low 7s. He denies any abdominal pains.    Objective   Vital signs in last 24 hours: Temp:  [95.7 F (35.4 C)-97.9 F (36.6 C)] 97 F (36.1 C) (07/21 0325) Pulse Rate:  [55-112] 110 (07/21 0700) Resp:  [9-29] 9 (07/21 0700) BP: (91-136)/(42-98) 115/69 (07/21 0700) SpO2:  [98 %-100 %] 100 % (07/21 0700) Weight:  [162 lb 4.1 oz (73.6 kg)] 162 lb 4.1 oz (73.6 kg) (07/21 0500) Last BM Date: 09/06/16 General:    AA male in NAD Heart:  Tachycardic, regular; no murmurs Lungs: Respirations even and unlabored, Abdomen:  Soft, nontender .  Extremities:  Bandaged wounds on lower extremity Neurologic:  Alert, pleasant, unclear if he comprehends questioning Psych:  Cooperative. Normal mood and affect.  Intake/Output from previous day: 07/20 0701 - 07/21 0700 In: 20 [I.V.:20] Out: 492 [Urine:260] Intake/Output this shift: No intake/output data recorded.  Lab Results:  Recent Labs  09/06/16 1600 09/06/16 2050 09/07/16 0400  WBC 14.2* 14.5* 13.0*  HGB 8.1* 7.7* 7.2*  HCT 23.6* 21.8* 21.2*  PLT 125* 117* 118*   BMET  Recent Labs  09/05/16 1523 09/06/16 0330 09/07/16 0405  NA 140  138 139 137  K 4.5  4.4 4.4 4.4  CL 111  108 106 105  CO2 20*  20* 25 25  GLUCOSE 117*  115* 109* 88  BUN 35*  35* 25* 25*  CREATININE 3.65*  3.71* 2.57* 2.88*  CALCIUM 7.9*  7.9* 8.1* 8.1*   LFT  Recent Labs  09/05/16 1523  ALBUMIN 2.9*   PT/INR  Recent Labs  09/04/16 1428 09/05/16 0304  LABPROT 13.5 16.6*  INR 1.03 1.33    Studies/Results: Dg Chest Port 1 View  Result Date: 09/07/2016 CLINICAL DATA:  Respiratory failure EXAM: PORTABLE CHEST 1 VIEW COMPARISON:  09/05/2016 FINDINGS: Chronic cardiopericardial enlargement. Dialysis catheter from the right with tips  at the right atrium. A nasogastric tube has been removed. Haziness of the chest from pleural fluid, possibly moderate on the right. No pneumothorax or air bronchogram. IMPRESSION: Cardiomegaly with pulmonary edema and pleural effusions. No change from 2 days prior. Electronically Signed   By: Monte Fantasia M.D.   On: 09/07/2016 07:15       Assessment / Plan:   66 y/o male with dementia, ESRD on HD, on Eliquis, ? cirrhosis, presented with a severe GI bleed, suspected lower. NG lavage negative. CT angio negative on admission, follow up tagged RBC scan suggested bleeding from rectosigmoid colon. Patient had stopped bleeding for > 48 hours, course complicated by NSTEMI, then had some recurrent bleeding yesterday evening which appears to have stopped again. Nursing reported bright red blood per rectum at the time. Hgb slightly decreased.  We have suspected this to be a likely diverticular bleed in the rectosigmoid colon, but it's not clear if he's ever had a prior colonoscopy. We have not been able to get in touch with any family and patient is unable to provide any significant history. We had held off on emergent endoscopy given his bleeding had stopped and his recent NSTEMI, and was hoping to slowly prep him for a colonoscopy, but given his recent bleeding, I think an unsedated flex sig is reasonable today to look at the area identified on  tagged RBC scan. I don't think he will be able to bowel prep without an NG placed again, but may need to do this pending his course. I discussed this with the patient - while I don't think he fully comprehends the issues at hand, he was agreeable with a flex sig today, likely later this AM. Please keep NPO for now, tap water enema x 2 prior to the procedure.   Of note, it is possible if he has cirrhosis based on imaging. I don't think upper GI bleed given negative lavage but would need EGD at some point. Given his recurrent bleed would place on prophylactic antibiotics.     Martin Cellar, MD [facility] Pager [phone]

## 2016-09-07 NOTE — Interval H&P Note (Signed)
History and Physical Interval Note:  09/07/2016 1:54 PM  George Burgess  has presented today for surgery, with the diagnosis of lower gi bleed  The various methods of treatment have been discussed with the patient and family. After consideration of risks, benefits and other options for treatment, the patient has consented to  Procedure(s): FLEXIBLE SIGMOIDOSCOPY (N/A) as a surgical intervention .  The patient's history has been reviewed, patient examined, no change in status, stable for surgery.  I have reviewed the patient's chart and labs.  Questions were answered to the patient's satisfaction.     Renelda Loma Cheryln Balcom

## 2016-09-07 NOTE — Progress Notes (Addendum)
PULMONARY / CRITICAL CARE MEDICINE   Name: George Burgess MRN: [id number] DOB: 1951/01/20    ADMISSION DATE:  09/04/2016 CONSULTATION DATE:  7/18   REFERRING MD: Jeneen Rinks  CHIEF COMPLAINT:  Hemorrhagic shock   HISTORY OF PRESENT ILLNESS:   This is a 66 year old male w/ sig h/o: CAF (on DOAC), HFrEF (EF 30%), prior CVA, vascular dementia, HTN, diabetes type II,  Bilateral Pleural effusions, PVD, prior Left BKA, chronic LE wounds. Resides at SNF (has no HCPOA). Presented from SNF 6/18 after having 2 large Volume maroon colored stools and BP in 70s. On arrival to ED he was awake. Remained hypotensive w/ SBP in 70s but would increase w/ fluid and blood resuscitation. His initial Hgb was 6.3. He was given K centra, received 2 units of blood In ED but remained hypotensive. Because of this PCCM asked to admit.   SUBJECTIVE: . Passed bloody bowel movements again overnight Plan for flex sig today Complains of inability to see Remains confused   VITAL SIGNS: BP 115/69   Pulse (!) 110   Temp (!) 97 F (36.1 C) (Axillary)   Resp (!) 9   Ht 6' (1.829 m)   Wt 73.6 kg (162 lb 4.1 oz)   SpO2 100%   BMI 22.01 kg/m   HEMODYNAMICS:    VENTILATOR SETTINGS:    INTAKE / OUTPUT: I/O last 3 completed shifts: In: 420 [I.V.:30; Blood:390] Out: 1294 [Urine:260; Other:1034]  PHYSICAL EXAMINATION: Blood pressure 115/67, pulse (!) 115, temperature 97.8 F (36.6 C), temperature source Oral, resp. rate (!) 30, height 6' (1.829 m), weight 162 lb 14.7 oz (73.9 kg), SpO2 100 %.  General:  Resting comfortably in bed HENT: NCAT OP clear, PERRL PULM: CTA B, normal effort CV: Irreg irreg, no mgr GI: BS+, soft, nontender MSK: normal bulk and tone Neuro: awake, alert, no distress, MAEW, has a pupillary light response but no eye response to threat, corneal reflex intact, otherwise moves all four extremities well   LABS:  BMET  Recent Labs Lab 09/05/16 1523 09/06/16 0330 09/07/16 0405  NA 140   138 139 137  K 4.5  4.4 4.4 4.4  CL 111  108 106 105  CO2 20*  20* 25 25  BUN 35*  35* 25* 25*  CREATININE 3.65*  3.71* 2.57* 2.88*  GLUCOSE 117*  115* 109* 88    Electrolytes  Recent Labs Lab 09/05/16 0304 09/05/16 1523 09/05/16 1642 09/06/16 0330 09/07/16 0405  CALCIUM 6.3* 7.9*  7.9*  --  8.1* 8.1*  MG 1.2*  --  1.6* 1.9  --   PHOS 3.2 3.4  --  3.2  --     CBC  Recent Labs Lab 09/06/16 1600 09/06/16 2050 09/07/16 0400  WBC 14.2* 14.5* 13.0*  HGB 8.1* 7.7* 7.2*  HCT 23.6* 21.8* 21.2*  PLT 125* 117* 118*    Coag's  Recent Labs Lab 09/04/16 0627 09/04/16 1428 09/05/16 0304  APTT  --   --  >200*  INR 1.61 1.03 1.33    Sepsis Markers  Recent Labs Lab 09/04/16 0716  LATICACIDVEN 2.38*    ABG  Recent Labs Lab 09/04/16 2125  PHART 7.392  PCO2ART 31.8*  PO2ART 86.0    Liver Enzymes  Recent Labs Lab 09/04/16 0627 09/05/16 0430 09/05/16 1523  AST 22  --   --   ALT 18  --   --   ALKPHOS 66  --   --   BILITOT 0.4  --   --  ALBUMIN 2.5* 2.6* 2.9*    Cardiac Enzymes  Recent Labs Lab 09/05/16 1642 09/05/16 2252 09/06/16 0330  TROPONINI 8.67* 9.70* 11.80*    Glucose  Recent Labs Lab 09/06/16 1146 09/06/16 1549 09/06/16 1926 09/06/16 2315 09/07/16 0324 09/07/16 0747  GLUCAP 83 80 88 79 76 87    Imaging Dg Chest Port 1 View  Result Date: 09/07/2016 CLINICAL DATA:  Respiratory failure EXAM: PORTABLE CHEST 1 VIEW COMPARISON:  09/05/2016 FINDINGS: Chronic cardiopericardial enlargement. Dialysis catheter from the right with tips at the right atrium. A nasogastric tube has been removed. Haziness of the chest from pleural fluid, possibly moderate on the right. No pneumothorax or air bronchogram. IMPRESSION: Cardiomegaly with pulmonary edema and pleural effusions. No change from 2 days prior. Electronically Signed   By: Monte Fantasia M.D.   On: 09/07/2016 07:15    STUDIES:  09/04/2016>>CTA>>No evidence of active GI bleed  at this time. Small penetrating atherosclerotic ulcer arising from the posteroinferior aspect of the right common iliac artery. 09/04/2016>>Blood Pool scan>>Subtle tracer activity suggested within the lower pelvis likely within the rectosigmoid that may represent the site of reported gastrointestinal bleeding.  CULTURES: 09/04/2016 Blood >>  ANTIBIOTICS: 7/21 ceftriaxone>   SIGNIFICANT EVENTS: 09/04/2016>> Admit 09/05/2016>> CVVH initiated until stabilized and able to transfer to [facility] for HD> 7/20 CVVHD held  LINES/TUBES: Rt femoral CVL > 7/18 R HD cath >> 7/19  DISCUSSION: 66 year old male with a complicated past medical history here with a lower GI bleed in the setting of anticoagulation use.  He has ESRD, CHF.  Continues to bleed as of 7/20.  He has been confused throughout his hospital stay but complains of blindness on 7/21 which he reports started 7/20 in the evening.  ddx PRES vs acute stroke (watershed vs embolic)  ASSESSMENT / PLAN: Lower GI bleed: hemodynamically stable but still bleeding as of 7/20  Plan Transfuse 1 U PRBC now Goal Hgb > 8gm/dL Flex sig today per GI to localize bleeding NPO Embolization if re-bleeds  Afib Chronic systolic heart failure NSTEMI from GI bleeding   Plan Treat bleeding Keep hemodynamically stable No anticoagulants F/u with cardiology Restart home BP meds when not bleeding  ESRD Plan HD per renal Move to [facility] for intermittent HD  Pulmonary: No acute issues Plan: Monitor O2 saturation  Infection At risk for SBP with cirrhosis and GI bleeding Plan Ceftriaxone for SBP prophylaxis  DM2 with hyperglycemia Plan:  SSI  Dementia History of CVA Acute blindness 7/20> 7/21, physical exam supports this, ddx PRES vs watershed vs embolic stroke? Outside window for acute intervention and not an anticoagulant candidate Plan:   MRI brain Neurology consult Hold sedating meds  Severe PVD. W/ venous ulcerations on RLE and   Prior left BKA Plan Would care Cont supportive care  No family bedside  My cc time managing multiple acute medical issues, discussing plan with consultants, managing medications, blood products 33 minutes  Move to [facility] for intermittent hemodialysis  Roselie Awkward, MD [place] [facility] Pager: [phone] Cell: [phone] After 3pm or if no response, call [phone]   09/07/2016, 9:04 AM

## 2016-09-08 ENCOUNTER — Inpatient Hospital Stay (HOSPITAL_COMMUNITY): Payer: Medicare Other

## 2016-09-08 DIAGNOSIS — I248 Other forms of acute ischemic heart disease: Secondary | ICD-10-CM

## 2016-09-08 LAB — CBC WITH DIFFERENTIAL/PLATELET
Basophils Absolute: 0 10*3/uL (ref 0.0–0.1)
Basophils Relative: 0 %
EOS PCT: 1 %
Eosinophils Absolute: 0.2 10*3/uL (ref 0.0–0.7)
HCT: 28 % — ABNORMAL LOW (ref 39.0–52.0)
Hemoglobin: 9.5 g/dL — ABNORMAL LOW (ref 13.0–17.0)
LYMPHS ABS: 0.9 10*3/uL (ref 0.7–4.0)
LYMPHS PCT: 7 %
MCH: 29.4 pg (ref 26.0–34.0)
MCHC: 33.9 g/dL (ref 30.0–36.0)
MCV: 86.7 fL (ref 78.0–100.0)
MONO ABS: 1.1 10*3/uL — AB (ref 0.1–1.0)
MONOS PCT: 8 %
Neutro Abs: 11 10*3/uL — ABNORMAL HIGH (ref 1.7–7.7)
Neutrophils Relative %: 84 %
PLATELETS: 158 10*3/uL (ref 150–400)
RBC: 3.23 MIL/uL — ABNORMAL LOW (ref 4.22–5.81)
RDW: 16.1 % — AB (ref 11.5–15.5)
WBC: 13.2 10*3/uL — ABNORMAL HIGH (ref 4.0–10.5)

## 2016-09-08 LAB — LIPID PANEL
CHOLESTEROL: 97 mg/dL (ref 0–200)
HDL: 40 mg/dL — AB (ref >40)
LDL CALC: 44 mg/dL (ref 0–99)
TRIGLYCERIDES: 64 mg/dL (ref <150)
Total CHOL/HDL Ratio: 2.4 RATIO
VLDL: 13 mg/dL (ref 0–40)

## 2016-09-08 LAB — COMPREHENSIVE METABOLIC PANEL
ALT: 18 U/L (ref 17–63)
AST: 38 U/L (ref 15–41)
Albumin: 2.9 g/dL — ABNORMAL LOW (ref 3.5–5.0)
Alkaline Phosphatase: 55 U/L (ref 38–126)
Anion gap: 8 (ref 5–15)
BUN: 27 mg/dL — ABNORMAL HIGH (ref 6–20)
CHLORIDE: 103 mmol/L (ref 101–111)
CO2: 23 mmol/L (ref 22–32)
CREATININE: 3.4 mg/dL — AB (ref 0.61–1.24)
Calcium: 8.3 mg/dL — ABNORMAL LOW (ref 8.9–10.3)
GFR, EST AFRICAN AMERICAN: 20 mL/min — AB (ref >60)
GFR, EST NON AFRICAN AMERICAN: 17 mL/min — AB (ref >60)
Glucose, Bld: 87 mg/dL (ref 65–99)
POTASSIUM: 4.4 mmol/L (ref 3.5–5.1)
Sodium: 134 mmol/L — ABNORMAL LOW (ref 135–145)
Total Bilirubin: 1.5 mg/dL — ABNORMAL HIGH (ref 0.3–1.2)
Total Protein: 5.6 g/dL — ABNORMAL LOW (ref 6.5–8.1)

## 2016-09-08 LAB — MAGNESIUM: MAGNESIUM: 2 mg/dL (ref 1.7–2.4)

## 2016-09-08 LAB — TROPONIN I: Troponin I: 16.69 ng/mL (ref <0.03)

## 2016-09-08 LAB — GLUCOSE, CAPILLARY
GLUCOSE-CAPILLARY: 81 mg/dL (ref 65–99)
Glucose-Capillary: 136 mg/dL — ABNORMAL HIGH (ref 65–99)
Glucose-Capillary: 75 mg/dL (ref 65–99)
Glucose-Capillary: 110 mg/dL — ABNORMAL HIGH (ref 65–99)
Glucose-Capillary: 90 mg/dL (ref 65–99)
Glucose-Capillary: 86 mg/dL (ref 65–99)

## 2016-09-08 LAB — PHOSPHORUS: PHOSPHORUS: 3.2 mg/dL (ref 2.5–4.6)

## 2016-09-08 MED ORDER — IOPAMIDOL (ISOVUE-370) INJECTION 76%
INTRAVENOUS | Status: AC
  Administered 2016-09-09: 50 mL
  Filled 2016-09-08: qty 50

## 2016-09-08 NOTE — Progress Notes (Signed)
Patient being transferred to [facility] for intermittent dialysis and CVA. Discussed with Dr. Lake Bells and patient will be transferred [facility] 09/09/16.

## 2016-09-08 NOTE — Progress Notes (Signed)
Subjective: Patient reports slightly improved vision  Exam: Vitals:   09/08/16 1610 09/08/16 1613  BP: (!) 135/95 (!) 149/94  Pulse: 100   Resp: 16   Temp: 97.9 F (36.6 C) 98.1 F (36.7 C)   Gen: In bed, NAD Resp: non-labored breathing, no acute distress Abd: soft, nt  Neuro: MS: Awake, alert, gives month as August, then gives year as "July" CN: He still has significant trouble with his vision, he does fixate me, but is unable to count fingers or reliably tracked me Motor: Moves all extremities with good strength Sensory: Intact light touch  Pertinent Labs: Troponin 9.7  Impression: 66 year old male with bilateral occipital infarctions. I suspect that this is due to either posterior circulation insufficiency with hypertension versus a posterior circulation embolus in the setting of atrial fibrillation and ventricular hypokinesis without anticoagulation. He was transferred from [facility] this afternoon. CTA has been delayed due to poor IV access.     Recommendations: 1) CTA head and neck(IV team has been notified) 2) MRI brain 3) ASA once cleared by GI 4) Stroke to follow beginning tomorrow.   Roland Rack, MD [facility] [phone]  If 7pm- 7am, please page neurology on call as listed in [place].

## 2016-09-08 NOTE — Progress Notes (Addendum)
[facility] Progress Note   Assessment/ Plan:    Dialysis orders: [facility]  TTS 4 hrs 3K/ 3C bath EDW 159 lbs BFR 350 DFR 800 R IJ TDC Heparin: 3800 u bolus   1 Hemorrhagic shock 2/2 acute lower GIB: likely diverticular; colonoscopy showing severe pan-diverticulosis Transfuse PRN.  Per PCCM and GI.  2. NSTEMI: likely demand from ABLA.  Not a candidate for cath per cardiology c/s note 7/20. 2. Afib on Eliquis: received KCentra, got  FEIBA. No longer candidate for DOAC 3 ESRD: TTS.  [facility].  Have gotten records. HD off schedule today then back to TTS.  [facility] 4 Volume: EDW 159 lbs.  BP s are getting better and no longer requiring pressors. 5. Anemia of ESRD/ ABLA: getting blood products, will give iron and ESA as appropriate 6 Metabolic Bone Disease: will place on binders and VDRA as appropriate 7.  Bilateral subacute occipital CVA: no hemorrhage? Posterior circulation insufficiency vs embolic.  Neuro following.  TTE performed Verified by RN at [facility] 7/21; he was not blind before.  Needs vascular imaging (CTA vs MRA most likely).  Of note, has a frontal meningioma as well.  Subjective:    CT head yesterday showing bilateral occipital infarcts.      Objective:   BP (!) 147/99   Pulse (!) 108   Temp 98.2 F (36.8 C) (Oral)   Resp (!) 22   Ht 6' (1.829 m)   Wt 77.4 kg (170 lb 10.2 oz)   SpO2 100%   BMI 23.14 kg/m   Physical Exam: GEN cachectic, NAD HEENT sclerae anicteric.  Did not make eye contact with me; per nursing can track just a little bit NECK no JVD PULM unlabored, diminished at bases CV tachycardic, no m/r/g ABD nontender GU: no more BRBPR EXT s/p L BKA, R leg with 2 ulcers, dressed on the dorsum of the foot and the R shin.  R foot is cool  NEURO sluggish pupillary reflex ACCESS: R IJ permcath  Labs: BMET  Recent Labs Lab 09/04/16 0627 09/04/16 1127 09/05/16 0304 09/05/16 1523  09/06/16 0330 09/07/16 0405 09/08/16 0929  NA 138 139 139 140  138 139 137 134*  K 3.9 4.1 5.6* 4.5  4.4 4.4 4.4 4.4  CL 105 109 111 111  108 106 105 103  CO2 23 20* 21* 20*  20* 25 25 23   GLUCOSE 185* 183* 130* 117*  115* 109* 88 87  BUN 28* 28* 35* 35*  35* 25* 25* 27*  CREATININE 2.99* 2.96* 3.43* 3.65*  3.71* 2.57* 2.88* 3.40*  CALCIUM 7.7* 7.2* 6.3* 7.9*  7.9* 8.1* 8.1* 8.3*  PHOS  --  3.0 3.2 3.4 3.2  --  3.2   CBC  Recent Labs Lab 09/06/16 1600 09/06/16 2050 09/07/16 0400 09/07/16 1600 09/08/16 0929  WBC 14.2* 14.5* 13.0*  --  13.2*  NEUTROABS 12.2* 12.6* 11.0*  --  11.0*  HGB 8.1* 7.7* 7.2* 9.6* 9.5*  HCT 23.6* 21.8* 21.2* 28.6* 28.0*  MCV 84.6 84.8 86.5  --  86.7  PLT 125* 117* 118*  --  158    @IMGRELPRIORS @ Medications:    . atorvastatin  80 mg Oral q1800  . Chlorhexidine Gluconate Cloth  6 each Topical Daily  . heparin lock flush  500 Units Intracatheter Once  . insulin aspart  0-9 Units Subcutaneous Q4H  . metoprolol tartrate  12.5 mg Oral BID  . pantoprazole  40 mg Intravenous Q12H  . sodium chloride flush  10-40 mL Intracatheter Q12H    Madelon Lips MD [facility] pgr [phone] 09/08/2016, 11:37 AM

## 2016-09-08 NOTE — Plan of Care (Signed)
Problem: Education: Goal: Knowledge of disease or condition will improve Outcome: Not Progressing No evidence of learning

## 2016-09-08 NOTE — Progress Notes (Signed)
CRITICAL VALUE ALERT  Critical Value:  Troponin 16.69  Date & Time Notied:  09/08/2016 10:30  Provider Notified: Dr. Lake Bells  Orders Received/Actions taken: See orders

## 2016-09-08 NOTE — Progress Notes (Signed)
[facility] Progress Note  Subjective:  Feels ok.  Vision returned.  Going to be transferred to [facility] today for HD, cardiac and neuro work-up.  No sign of any bleeding.  Objective:  Vital signs in last 24 hours: Temp:  [97.4 F (36.3 C)-99.7 F (37.6 C)] 98.2 F (36.8 C) (07/22 0800) Pulse Rate:  [51-115] 108 (07/22 0400) Resp:  [11-26] 23 (07/22 0400) BP: (121-172)/(74-107) 132/84 (07/22 0400) SpO2:  [92 %-100 %] 100 % (07/22 0400) Weight:  [170 lb 10.2 oz (77.4 kg)] 170 lb 10.2 oz (77.4 kg) (07/22 0400) Last BM Date: 09/06/16 General:  Alert, chronically ill-appearing, in NAD Heart:  Irregularly irregular; no murmurs Pulm:  CTAB.  No increased WOB. Abdomen:  Soft, non-distended.  BS present.  Non-tender. Extremities:  Left BKA and right foot with bandages, etc.  Intake/Output from previous day: 07/21 0701 - 07/22 0700 In: 1145 [P.O.:730; Blood:365; IV Piggyback:50] Out: 450 [Urine:450]  Lab Results:  Recent Labs  09/06/16 1600 09/06/16 2050 09/07/16 0400 09/07/16 1600  WBC 14.2* 14.5* 13.0*  --   HGB 8.1* 7.7* 7.2* 9.6*  HCT 23.6* 21.8* 21.2* 28.6*  PLT 125* 117* 118*  --    BMET  Recent Labs  09/05/16 1523 09/06/16 0330 09/07/16 0405  NA 140  138 139 137  K 4.5  4.4 4.4 4.4  CL 111  108 106 105  CO2 20*  20* 25 25  GLUCOSE 117*  115* 109* 88  BUN 35*  35* 25* 25*  CREATININE 3.65*  3.71* 2.57* 2.88*  CALCIUM 7.9*  7.9* 8.1* 8.1*   LFT  Recent Labs  09/05/16 1523  ALBUMIN 2.9*   Hepatitis Panel  Recent Labs  09/06/16 1400  HEPBSAG Negative    Ct Head Wo Contrast  Result Date: 09/07/2016 CLINICAL DATA:  New onset blindness. EXAM: CT HEAD WITHOUT CONTRAST TECHNIQUE: Contiguous axial images were obtained from the base of the skull through the vertex without intravenous contrast. COMPARISON:  None. FINDINGS: Brain: Generalized brain atrophy. There chronic small-vessel ischemic changes affecting the brainstem. Old small  vessel cerebellar infarctions. Cerebral hemispheres show subacute infarction in both parieto-occipital junction regions, more extensive on the left than the right. Mild swelling but no evidence of hemorrhage. Elsewhere, the brain shows extensive chronic small-vessel ischemic changes throughout the white matter. There are old lacunar infarctions affecting the thalami and basal ganglia. No evidence of hemorrhage, hydrocephalus or extra-axial fluid collection. No intra-axial mass. There is a right frontal convexity meningioma measuring 3.6 by 3.1 by 1.6 cm. Minimal mass effect upon the right frontal lobe, not significant. Vascular: There is atherosclerotic calcification of the major vessels at the base of the brain. Skull: Negative Sinuses/Orbits: Clear/normal Other: None significant IMPRESSION: Subacute infarctions in both parieto-occipital regions, more extensive on the left than the right. Mild swelling but no evidence of mass effect or shift. No hemorrhage. Extensive old ischemic changes elsewhere as outlined above. Right frontal convexity meningioma. Electronically Signed   By: Nelson Chimes M.D.   On: 09/07/2016 15:45   Dg Chest Port 1 View  Result Date: 09/07/2016 CLINICAL DATA:  Respiratory failure EXAM: PORTABLE CHEST 1 VIEW COMPARISON:  09/05/2016 FINDINGS: Chronic cardiopericardial enlargement. Dialysis catheter from the right with tips at the right atrium. A nasogastric tube has been removed. Haziness of the chest from pleural fluid, possibly moderate on the right. No pneumothorax or air bronchogram. IMPRESSION: Cardiomegaly with pulmonary edema and pleural effusions. No change from 2 days prior. Electronically  Signed   By: Monte Fantasia M.D.   On: 09/07/2016 07:15   Assessment / Plan: -GIB: Suspect diverticular in origin.  Bleeding scan suggestive of bleeding from rectosigmoid region and full colonoscopy 7/21 showed severe pan-diverticulosis.  No further sign of bleeding. -ALBA:  Hgb now stable  in the 9 gram range. -Cirrhosis by imaging:  Viral hepatitis studies are negative.  No splenomegaly or varices noted on imaging.  Platelets are normal. -ESRD: On HD.  Going to be transferred to [facility] today. -NSTEMI:  Cardiology consulting. -Vascular dementia: Poor historian.  No family has been reached after multiple attempts by staff. -Blindness:  Vision returned for the most part.  Neurology following and evaluation for cause.  *No further work-up from a GI standpoint.  Needs some antiplatelets and/or anticoagulants from neuro and cardiology standpoints.  I think that it would be safe to restart ASA now.  Will have Dr. Havery Moros address this further especially regarding any other agents.    LOS: 4 days   Jorene Kaylor D.  09/08/2016, 9:07 AM  Pager number [phone]

## 2016-09-08 NOTE — Progress Notes (Signed)
PULMONARY / CRITICAL CARE MEDICINE   Name: George Burgess MRN: [id number] DOB: May 27, 1950    ADMISSION DATE:  09/04/2016 CONSULTATION DATE:  7/18   REFERRING MD: Jeneen Rinks  CHIEF COMPLAINT:  Hemorrhagic shock   HISTORY OF PRESENT ILLNESS:   This is a 66 year old male w/ sig h/o: CAF (on DOAC), HFrEF (EF 30%), prior CVA, vascular dementia, HTN, diabetes type II,  Bilateral Pleural effusions, PVD, prior Left BKA, chronic LE wounds. Resides at SNF (has no HCPOA). Presented from SNF 6/18 after having 2 large Volume maroon colored stools and BP in 70s. On arrival to ED he was awake. Remained hypotensive w/ SBP in 70s but would increase w/ fluid and blood resuscitation. His initial Hgb was 6.3. He was given K centra, received 2 units of blood In ED but remained hypotensive. Because of this PCCM asked to admit.   SUBJECTIVE: . Blindness better today Seen by neurology Colonoscopy performed yesterday: no blood in colon, presumption from GI is that he had a distal sigmoid colon bleed   VITAL SIGNS: BP 132/84 (BP Location: Right Arm)   Pulse (!) 108   Temp 98.2 F (36.8 C) (Oral)   Resp (!) 23   Ht 6' (1.829 m)   Wt 77.4 kg (170 lb 10.2 oz)   SpO2 100%   BMI 23.14 kg/m   HEMODYNAMICS:    VENTILATOR SETTINGS:    INTAKE / OUTPUT: I/O last 3 completed shifts: In: 8250 [P.O.:730; Blood:365; IV Piggyback:50] Out: 450 [Urine:450]  PHYSICAL EXAMINATION: Blood pressure 115/67, pulse (!) 115, temperature 97.8 F (36.6 C), temperature source Oral, resp. rate (!) 30, height 6' (1.829 m), weight 162 lb 14.7 oz (73.9 kg), SpO2 100 %.  General:  Resting comfortably in bed HENT: NCAT OP clear PULM: CTA B, normal effort CV: Irreg irreg, no mgr GI: BS+, soft, nontender MSK: normal bulk and tone Neuro: awake, alert, no distress, MAEW    LABS:  BMET  Recent Labs Lab 09/05/16 1523 09/06/16 0330 09/07/16 0405  NA 140  138 139 137  K 4.5  4.4 4.4 4.4  CL 111  108 106 105  CO2 20*   20* 25 25  BUN 35*  35* 25* 25*  CREATININE 3.65*  3.71* 2.57* 2.88*  GLUCOSE 117*  115* 109* 88    Electrolytes  Recent Labs Lab 09/05/16 0304 09/05/16 1523 09/05/16 1642 09/06/16 0330 09/07/16 0405  CALCIUM 6.3* 7.9*  7.9*  --  8.1* 8.1*  MG 1.2*  --  1.6* 1.9  --   PHOS 3.2 3.4  --  3.2  --     CBC  Recent Labs Lab 09/06/16 1600 09/06/16 2050 09/07/16 0400 09/07/16 1600  WBC 14.2* 14.5* 13.0*  --   HGB 8.1* 7.7* 7.2* 9.6*  HCT 23.6* 21.8* 21.2* 28.6*  PLT 125* 117* 118*  --     Coag's  Recent Labs Lab 09/04/16 0627 09/04/16 1428 09/05/16 0304  APTT  --   --  >200*  INR 1.61 1.03 1.33    Sepsis Markers  Recent Labs Lab 09/04/16 0716  LATICACIDVEN 2.38*    ABG  Recent Labs Lab 09/04/16 2125  PHART 7.392  PCO2ART 31.8*  PO2ART 86.0    Liver Enzymes  Recent Labs Lab 09/04/16 0627 09/05/16 0430 09/05/16 1523  AST 22  --   --   ALT 18  --   --   ALKPHOS 66  --   --   BILITOT 0.4  --   --  ALBUMIN 2.5* 2.6* 2.9*    Cardiac Enzymes  Recent Labs Lab 09/05/16 1642 09/05/16 2252 09/06/16 0330  TROPONINI 8.67* 9.70* 11.80*    Glucose  Recent Labs Lab 09/07/16 0747 09/07/16 1552 09/07/16 2010 09/07/16 2322 09/08/16 0355 09/08/16 0758  GLUCAP 87 75 76 94 136* 75    Imaging Ct Head Wo Contrast  Result Date: 09/07/2016 CLINICAL DATA:  New onset blindness. EXAM: CT HEAD WITHOUT CONTRAST TECHNIQUE: Contiguous axial images were obtained from the base of the skull through the vertex without intravenous contrast. COMPARISON:  None. FINDINGS: Brain: Generalized brain atrophy. There chronic small-vessel ischemic changes affecting the brainstem. Old small vessel cerebellar infarctions. Cerebral hemispheres show subacute infarction in both parieto-occipital junction regions, more extensive on the left than the right. Mild swelling but no evidence of hemorrhage. Elsewhere, the brain shows extensive chronic small-vessel ischemic  changes throughout the white matter. There are old lacunar infarctions affecting the thalami and basal ganglia. No evidence of hemorrhage, hydrocephalus or extra-axial fluid collection. No intra-axial mass. There is a right frontal convexity meningioma measuring 3.6 by 3.1 by 1.6 cm. Minimal mass effect upon the right frontal lobe, not significant. Vascular: There is atherosclerotic calcification of the major vessels at the base of the brain. Skull: Negative Sinuses/Orbits: Clear/normal Other: None significant IMPRESSION: Subacute infarctions in both parieto-occipital regions, more extensive on the left than the right. Mild swelling but no evidence of mass effect or shift. No hemorrhage. Extensive old ischemic changes elsewhere as outlined above. Right frontal convexity meningioma. Electronically Signed   By: Nelson Chimes M.D.   On: 09/07/2016 15:45    STUDIES:  09/04/2016>>CTA>>No evidence of active GI bleed at this time. Small penetrating atherosclerotic ulcer arising from the posteroinferior aspect of the right common iliac artery. 09/04/2016>>Blood Pool scan>>Subtle tracer activity suggested within the lower pelvis likely within the rectosigmoid that may represent the site of reported gastrointestinal bleeding.  CULTURES: 09/04/2016 Blood >>  ANTIBIOTICS: 7/21 ceftriaxone>   SIGNIFICANT EVENTS: 09/04/2016>> Admit 09/05/2016>> CVVH initiated until stabilized and able to transfer to [facility] for HD> 7/20 CVVHD held 7/21 colonoscopy> no blood seen  LINES/TUBES: Rt femoral CVL > 7/18 R HD cath >> 7/19  DISCUSSION: 66 year old male with a complicated past medical history here with a lower GI bleed in the setting of anticoagulation use.  He has ESRD, CHF.  Continues to bleed as of 7/20.  He has been confused throughout his hospital stay but complains of blindness on 7/21 which he reports started 7/20 in the evening.  ddx PRES vs acute stroke (watershed vs embolic).  As of 7/22 the blindness is improving.   He needs a CTA of the head and neck and needs an MRI brain.    ASSESSMENT / PLAN: Lower GI bleed: hemodynamically stable but still bleeding as of 7/20  Plan Monitor for bleeding Monitor CBC Goal Hgb > 8gm/dL   Afib Chronic systolic heart failure NSTEMI from GI bleeding   Plan Restart ASA when able Restart anticoagulation when able  ESRD Plan HD per renal Move to [facility] for intermittent HD  Pulmonary: No acute issues Plan: Monitor O2 saturation  Infection At risk for SBP with cirrhosis and GI bleeding Plan Ceftriaxone for SBP prophylaxis  DM2 with hyperglycemia Plan:  SSI  Dementia History of CVA Acute blindness 7/20> 7/21, improving 7/22; see discussion  Plan:   MRI brain, CTA neck/head Appreciate Neurology consult Hold sedating meds  Severe PVD. W/ venous ulcerations on RLE and  Prior  left BKA Plan Would care Cont supportive care  No family bedside   Move to [facility] for intermittent hemodialysis  Roselie Awkward, MD [facility] Pager: [phone] Cell: [phone] After 3pm or if no response, call [phone]   09/08/2016, 9:13 AM

## 2016-09-08 NOTE — Progress Notes (Signed)
Pt received from [facility] ICU [facility] to bed assessed and CHG bath done Pt ST 105 BP 127/94 02 sat 100 % on RA Alert- oriented to name and birthday

## 2016-09-08 NOTE — Progress Notes (Signed)
PT Cancellation Note  Patient Details Name: Kelsie Kramp MRN: [id number] DOB: 1950/07/03   Cancelled Treatment:    Reason Eval/Treat Not Completed: Other (comment) (pt currently being transported to [facility]. PT will follow there. )   Philomena Doheny 09/08/2016, 11:38 AM  [phone]

## 2016-09-09 ENCOUNTER — Inpatient Hospital Stay (HOSPITAL_COMMUNITY): Payer: Medicare Other

## 2016-09-09 ENCOUNTER — Encounter (HOSPITAL_COMMUNITY): Payer: Self-pay | Admitting: Gastroenterology

## 2016-09-09 DIAGNOSIS — E440 Moderate protein-calorie malnutrition: Secondary | ICD-10-CM

## 2016-09-09 DIAGNOSIS — K5791 Diverticulosis of intestine, part unspecified, without perforation or abscess with bleeding: Principal | ICD-10-CM

## 2016-09-09 LAB — CBC
HCT: 28 % — ABNORMAL LOW (ref 39.0–52.0)
Hemoglobin: 9.2 g/dL — ABNORMAL LOW (ref 13.0–17.0)
MCH: 28.3 pg (ref 26.0–34.0)
MCHC: 32.9 g/dL (ref 30.0–36.0)
MCV: 86.2 fL (ref 78.0–100.0)
PLATELETS: 172 10*3/uL (ref 150–400)
RBC: 3.25 MIL/uL — ABNORMAL LOW (ref 4.22–5.81)
RDW: 15.7 % — AB (ref 11.5–15.5)
WBC: 11.8 10*3/uL — ABNORMAL HIGH (ref 4.0–10.5)

## 2016-09-09 LAB — URINALYSIS, ROUTINE W REFLEX MICROSCOPIC
Bilirubin Urine: NEGATIVE
Glucose, UA: NEGATIVE mg/dL
Hgb urine dipstick: NEGATIVE
KETONES UR: NEGATIVE mg/dL
NITRITE: NEGATIVE
PROTEIN: 100 mg/dL — AB
Specific Gravity, Urine: 1.017 (ref 1.005–1.030)
pH: 5 (ref 5.0–8.0)

## 2016-09-09 LAB — BPAM RBC
BLOOD PRODUCT EXPIRATION DATE: 201807302359
BLOOD PRODUCT EXPIRATION DATE: 201808102359
BLOOD PRODUCT EXPIRATION DATE: 201808162359
Blood Product Expiration Date: 201807302359
Blood Product Expiration Date: 201808102359
Blood Product Expiration Date: 201808102359
Blood Product Expiration Date: 201808102359
Blood Product Expiration Date: 201808142359
ISSUE DATE / TIME: 201807180631
ISSUE DATE / TIME: 201807181528
ISSUE DATE / TIME: 201807191613
ISSUE DATE / TIME: 201807192003
ISSUE DATE / TIME: 201807210947
ISSUE DATE / TIME: 201807180720
ISSUE DATE / TIME: 201807180809
ISSUE DATE / TIME: 201807190216
UNIT TYPE AND RH: 7300
UNIT TYPE AND RH: 7300
Unit Type and Rh: 7300
Unit Type and Rh: 7300
Unit Type and Rh: 7300
Unit Type and Rh: 7300
Unit Type and Rh: 9500
Unit Type and Rh: 9500

## 2016-09-09 LAB — TYPE AND SCREEN
ABO/RH(D): B POS
ANTIBODY SCREEN: NEGATIVE
UNIT DIVISION: 0
UNIT DIVISION: 0
Unit division: 0
Unit division: 0
Unit division: 0
Unit division: 0
Unit division: 0
Unit division: 0

## 2016-09-09 LAB — BASIC METABOLIC PANEL
Anion gap: 9 (ref 5–15)
BUN: 13 mg/dL (ref 6–20)
CALCIUM: 8.1 mg/dL — AB (ref 8.9–10.3)
CO2: 24 mmol/L (ref 22–32)
CREATININE: 2.6 mg/dL — AB (ref 0.61–1.24)
Chloride: 100 mmol/L — ABNORMAL LOW (ref 101–111)
GFR calc non Af Amer: 24 mL/min — ABNORMAL LOW (ref >60)
GFR, EST AFRICAN AMERICAN: 28 mL/min — AB (ref >60)
Glucose, Bld: 83 mg/dL (ref 65–99)
Potassium: 3.6 mmol/L (ref 3.5–5.1)
Sodium: 133 mmol/L — ABNORMAL LOW (ref 135–145)

## 2016-09-09 LAB — TROPONIN I: TROPONIN I: 16.97 ng/mL — AB (ref <0.03)

## 2016-09-09 LAB — HEMOGLOBIN AND HEMATOCRIT, BLOOD
HEMATOCRIT: 28.6 % — AB (ref 39.0–52.0)
HEMOGLOBIN: 9.6 g/dL — AB (ref 13.0–17.0)

## 2016-09-09 LAB — GLUCOSE, CAPILLARY
GLUCOSE-CAPILLARY: 77 mg/dL (ref 65–99)
GLUCOSE-CAPILLARY: 79 mg/dL (ref 65–99)
GLUCOSE-CAPILLARY: 77 mg/dL (ref 65–99)
GLUCOSE-CAPILLARY: 67 mg/dL (ref 65–99)
Glucose-Capillary: 120 mg/dL — ABNORMAL HIGH (ref 65–99)
Glucose-Capillary: 112 mg/dL — ABNORMAL HIGH (ref 65–99)

## 2016-09-09 LAB — PROTIME-INR
INR: 1.26
Prothrombin Time: 15.9 seconds — ABNORMAL HIGH (ref 11.4–15.2)

## 2016-09-09 LAB — CULTURE, BLOOD (ROUTINE X 2)
Culture: NO GROWTH
SPECIAL REQUESTS: ADEQUATE

## 2016-09-09 LAB — OSMOLALITY, URINE: OSMOLALITY UR: 361 mosm/kg (ref 300–900)

## 2016-09-09 MED ORDER — WARFARIN SODIUM 5 MG PO TABS
2.5000 mg | ORAL_TABLET | Freq: Once | ORAL | Status: AC
  Administered 2016-09-09: 2.5 mg via ORAL
  Filled 2016-09-09: qty 1

## 2016-09-09 MED ORDER — PATIENT'S GUIDE TO USING COUMADIN BOOK
Freq: Once | Status: DC
  Filled 2016-09-09: qty 1

## 2016-09-09 MED ORDER — INSULIN ASPART 100 UNIT/ML ~~LOC~~ SOLN: 0.0000 [IU] | Freq: Every day | SUBCUTANEOUS | Status: DC

## 2016-09-09 MED ORDER — ISOSORBIDE MONONITRATE ER 30 MG PO TB24
30.0000 mg | ORAL_TABLET | Freq: Every day | ORAL | Status: DC
  Administered 2016-09-09 – 2016-09-13 (×3): 30 mg via ORAL
  Filled 2016-09-09 (×3): qty 1

## 2016-09-09 MED ORDER — WARFARIN - PHARMACIST DOSING INPATIENT
Freq: Every day | Status: DC
  Administered 2016-09-09 – 2016-09-11 (×3)

## 2016-09-09 MED ORDER — METOPROLOL TARTRATE 50 MG PO TABS: 50.0000 mg | ORAL_TABLET | Freq: Two times a day (BID) | ORAL | Status: DC

## 2016-09-09 MED ORDER — METOPROLOL TARTRATE 25 MG PO TABS
25.0000 mg | ORAL_TABLET | Freq: Two times a day (BID) | ORAL | Status: DC
  Administered 2016-09-09 – 2016-09-13 (×6): 25 mg via ORAL
  Filled 2016-09-09 (×7): qty 1

## 2016-09-09 MED ORDER — PANTOPRAZOLE SODIUM 40 MG PO TBEC
40.0000 mg | DELAYED_RELEASE_TABLET | Freq: Every day | ORAL | Status: DC
  Administered 2016-09-10 – 2016-09-13 (×4): 40 mg via ORAL
  Filled 2016-09-09 (×4): qty 1

## 2016-09-09 MED ORDER — WARFARIN VIDEO: Freq: Once | Status: DC

## 2016-09-09 MED ORDER — INSULIN ASPART 100 UNIT/ML ~~LOC~~ SOLN
0.0000 [IU] | Freq: Three times a day (TID) | SUBCUTANEOUS | Status: DC
  Administered 2016-09-10 – 2016-09-12 (×4): 1 [IU] via SUBCUTANEOUS
  Administered 2016-09-13: 2 [IU] via SUBCUTANEOUS
  Administered 2016-09-13: 3 [IU] via SUBCUTANEOUS
  Administered 2016-09-13: 1 [IU] via SUBCUTANEOUS

## 2016-09-09 NOTE — Progress Notes (Signed)
STROKE TEAM PROGRESS NOTE   HISTORY OF PRESENT ILLNESS (per record) George Burgess is a 66 y.o. male with a history of vascular dementia, atrial fibrillation, systolic/diastolic heart failure, encephalopathy, ESRD, essential hypertension, peripheral vascular disease, stroke, and type II diabetes mellitus who presents with hypotensive shock due to GI bleed. He has had some confusion since being hospitalized and was noted on 09/08/2016 to have developed cortical blindness.  It was confirmed with the nursing home that this is not his baseline. He states that it is been going on "for a while" but is unable to give me a better estimate.  On admission, his blood pressures were in the 60s, this is felt to be secondary to GI bleeding, radiotracer uptake suggested the rectosigmoid area, colonoscopy did not reveal any active bleeding. Gastrologist is suspecting diverticular bleed.  He was on CRT, but this is been discontinued and he is going to be transferred over to come for intermittent hemodialysis.    LKW: Unclear  Patient was not administered IV t-PA secondary to inability to establish time last known well. He was admitted to the ICU ror further evaluation and treatment.    SUBJECTIVE (INTERVAL HISTORY) His nurse is at the bedside.   On exam, the patient is non-reactive to bilateral visual confrontation and appears cortically blind.  Patient claims to see examiner's face and hand motion, but is highly inconsistent and inaccurate.  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   OBJECTIVE Temp:  [97.5 F (36.4 C)-98.6 F (37 C)] 97.5 F (36.4 C) (07/23 1233) Pulse Rate:  [33-108] 106 (07/23 1233) Cardiac Rhythm: Sinus tachycardia (07/23 0707) Resp:  [14-28] 23 (07/23 1233) BP: (109-156)/(33-104) 130/96 (07/23 1233) SpO2:  [81 %-100 %] 100 % (07/23 1233) Weight:  [74.4 kg (164 lb)-79.4 kg (175 lb)] 79.4 kg (175 lb) (07/23  0302)  CBC:  Recent Labs Lab 09/07/16 0400  09/08/16 0929 09/09/16 0444  WBC 13.0*  --  13.2* 11.8*  NEUTROABS 11.0*  --  11.0*  --   HGB 7.2*  < > 9.5* 9.2*  HCT 21.2*  < > 28.0* 28.0*  MCV 86.5  --  86.7 86.2  PLT 118*  --  158 172  < > = values in this interval not displayed.  Basic Metabolic Panel:  Recent Labs Lab 09/06/16 0330  09/08/16 0929 09/09/16 0444  NA 139  < > 134* 133*  K 4.4  < > 4.4 3.6  CL 106  < > 103 100*  CO2 25  < > 23 24  GLUCOSE 109*  < > 87 83  BUN 25*  < > 27* 13  CREATININE 2.57*  < > 3.40* 2.60*  CALCIUM 8.1*  < > 8.3* 8.1*  MG 1.9  --  2.0  --   PHOS 3.2  --  3.2  --   < > = values in this interval not displayed.  Lipid Panel:    Component Value Date/Time   CHOL 97 09/08/2016 0543   TRIG 64 09/08/2016 0543   HDL 40 (L) 09/08/2016 0543   CHOLHDL 2.4 09/08/2016 0543   VLDL 13 09/08/2016 0543   LDLCALC 44 09/08/2016 0543   HgbA1c: No results found for: HGBA1C Urine Drug Screen: No results found for: LABOPIA, COCAINSCRNUR, LABBENZ, AMPHETMU, THCU, LABBARB  Alcohol Level No results found for: ETH  IMAGING  Ct Head Wo Contrast  09/07/2016 IMPRESSION: Subacute infarctions in both parieto-occipital regions, more extensive on the left  than the right. Mild swelling but no evidence of mass effect or shift. No hemorrhage. Extensive old ischemic changes elsewhere as outlined above. Right frontal convexity meningioma.   Ct Angio Neck W and Wo Contrast 09/09/2016 IMPRESSION: 1. No emergent large vessel occlusion. 2. No high-grade stenosis or aneurysm of the intracranial arteries. 3. No potential embolic source identified. The carotid systems and aortic arch are free of atherosclerotic disease. In this context, echocardiography is recommended to assess for potential intracardiac thrombus. 4. Bioccipital hypoattenuation consistent with known acute infarcts. 5. Large bilateral pleural effusions.   Mr Brain Wo Contrast 09/08/2016 IMPRESSION: Acute  embolic infarctions throughout the cerebellum and both cerebral hemispheres consistent with cardiac or ascending aortic origin. Largest regions of acute infarction are in the parieto-occipital regions bilaterally. No sign of hemorrhagic transformation. No mass effect or shift. Old left parietal cortical and subcortical infarction. Old small vessel infarctions of the deep white matter. Right frontal convexity meningioma without significant mass effect.  2D Echo 09/05/2016 Study Conclusions - Left ventricle: The cavity size was normal. There was moderate concentric hypertrophy. Systolic function was mildly to moderately reduced. The estimated ejection fraction was in the range of 40% to 45%. Hypokinesis of the anteroseptal, inferior, and inferoseptal myocardium. Doppler parameters are consistent with a reversible restrictive pattern, indicative of decreased left ventricular diastolic compliance and/or increased left atrial pressure (grade 3 diastolic dysfunction). Doppler parameters are consistent with high ventricular filling pressure. - Ventricular septum: The contour showed diastolic flattening and systolic flattening consistent with right ventricular pressure and volume overload. - Aortic valve: There was moderate regurgitation. Regurgitation pressure half-time: 390 ms. - Mitral valve: Transvalvular velocity was within the normal range.  There was no evidence for stenosis. There was moderate regurgitation. - Left atrium: The atrium was severely dilated. - Right ventricle: The cavity size was normal. Wall thickness was normal. Systolic function was normal. - Atrial septum: No defect or patent foramen ovale was identified by color flow Doppler. - Tricuspid valve: There was mild regurgitation. - Pulmonary arteries: Systolic pressure was severely increased. PA   peak pressure: 67 mm Hg (S). - Pericardium, extracardiac: A small pericardial effusion was   identified. LV EF: 40% -   45%   PHYSICAL  EXAM Frail cachectic middle-aged African-American male currently not in distress. He has left below-knee amputation. . Afebrile. Head is nontraumatic. Neck is supple without bruit.    Cardiac exam no murmur or gallop. Lungs are clear to auscultation. Distal pulses are well felt. Neurological Exam :  Awake alert oriented to place only. Patient is cortically blind but states that he can see my face and hand but is unable to describe any features. He has elements of Anton`s and Balint`s  Syndrome. He can follow simple midline and extremity commands. Pupils are small but reactive. He states he can perceive light but is not correcting given the direction. Fundi could not be visualized. Face is symmetric without weakness. Tongue is midline. Motor system exam able to move all 4 extremities equally well symmetrically against gravity. No focal weakness. Deep tendon reflexes are depressed. Left ankle jerk cannot be tested due to amputation. Right plantar downgoing and left cannot be tested. Gait deferred ASSESSMENT/PLAN [name] is a 66 y.o. male with history of vascular dementia, atrial fibrillation, systolic/diastolic heart failure, encephalopathy, ESRD, essential hypertension, peripheral vascular disease, stroke, and type II diabetes mellitus who presents with hypotensive shock due to GI bleed. He did not receive IV t-PA due to inability to establish  time last known well.    Stroke: Acute bilateral cerebrum and cerebellar infarctions, with the largest in the parieto-occipital regions bilaterally, cardioembolic secondary to subtherapeutic anticoagulation on 2.5mg  daily dose Eliquis.  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  MRI head: Acute bilateral cerebrum and cerebellar infarctions, with the largest in the parieto-occipital regions bilaterally  MRA head  not  ordered  CTA head/neck: No LVO or high-grade stenosis.  2D Echo: mild AR, MR, and TR.  EF 40-45%. No source of embolus  LDL 44  HgbA1c pending   Heparin for VTE prophylaxis  Diet renal with fluid restriction Fluid restriction: 2000 mL Fluid; Room service appropriate? 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 The patient had presented with GI bleed and was hypertensive but now has developed cortical blindness secondary to bilateral posterior and anterior cerebral circulation embolic infarcts likely from his atrial fibrillation. Recommend switch eliquis to warfarin for anticoagulation given his renal failure and dialysis. Continue ongoing stroke workup. No family available at bedside for discussion. Discussed with Dr. Lake Bells. Greater than 50% time during this 35 minute visit was spent on counseling and coordination of care about his embolic strokes, cortical blindness, discussing  change in therapy and answering questions  Antony Contras, MD Medical Director [place] Pager: [phone] 09/09/2016 3:54 PM   To contact Stroke Continuity provider, please refer to [email or web address] After hours, contact General Neurology

## 2016-09-09 NOTE — Plan of Care (Signed)
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 Patient placed on low bed in lowest position to prevent falls. Patient has L BKA and visualized leaning out of bed. Will continue to monitor and work on a sitter if necessary.   Problem: Nutrition: Goal: Adequate nutrition will be maintained Outcome: Progressing Patient has order for diet- after giving patient one sip of water with small pill- patient has been coughing since. CTA shows bilateral pleural effusions. Questioning aspiration.   Problem: Education: Goal: Knowledge of disease or condition will improve Outcome: Progressing Attempted to discuss plan of care with patient and when told he had a stroke all he says is " yea." Patient will need further teaching- clearly unable to follow at this time.

## 2016-09-09 NOTE — Plan of Care (Signed)
Problem: Education: Goal: Knowledge of [place] [facility] Education information/materials will improve Outcome: Progressing Patient is confused, able to participate minimally in education  Problem: Safety: Goal: Ability to remain free from injury will improve Outcome: Progressing Low bed in use, safety mitts in place to prevent pulling lines, bed alarm in use.  Problem: Health Behavior/Discharge Planning: Goal: Ability to manage health-related needs will improve Outcome: Not Progressing Unable to care for self

## 2016-09-09 NOTE — Discharge Instructions (Signed)

## 2016-09-09 NOTE — Care Management Note (Addendum)
Case Management Note  Patient Details  Name: George Burgess MRN: [id number] Date of Birth: October 23, 1950  Subjective/Objective:  Transfer from [place] , from [facility], [place] aware.  Presents with Hemorrhagic shock secondary to GIB, NSTEMI, afib, ESRD, ABLA, Metabolic bone Disease, Bil subacute occipital CVA ,frontal meningioma.                  Action/Plan: NCM will follow along with CSW for dc needs.  Expected Discharge Date:   (unknown)               Expected Discharge Plan:  Skilled Nursing Facility  In-House Referral:  Clinical Social Work  Discharge planning Services  CM Consult  Post Acute Care Choice:    Choice offered to:     DME Arranged:    DME Agency:     HH Arranged:    [place] Agency:     Status of Service:  Completed, signed off  If discussed at [employer], dates discussed:    Additional Comments:  Zenon Mayo, RN 09/09/2016, 2:12 PM

## 2016-09-09 NOTE — Progress Notes (Addendum)
[place] TEAM 1 - Stepdown/ICU TEAM  George Burgess  ATF:[id number] DOB: Jun 17, 1950 DOA: 09/04/2016 PCP: Caprice Renshaw, MD    Brief Narrative:  George Burgess w/ hx of chronic Afib on DOAC, chronic systolic CHF (EF 27%), prior CVA, vascular dementia, HTN, DM2, PVD, prior Left BKA, and chronic LE wounds who resides at a SNF. He presented from [facility] 7/18 after having 2 large volume maroon colored stools and BP in the 70s. On arrival to ED he was awake. His initial Hgb was 6.3. He was given K centra and 2 units of blood in the ED but remained hypotensive. Because of this PCCM asked to admit.   Significant Events: 7/18 Admit - R femoral CVL 7/19 R HD cath inserted - CVVH initiated until stabilized and able to transfer to [facility] for HD 7/21 colonoscopy > no blood seen 7/22 transferred to [facility] 7/23 [facility] assumed care   Subjective: Pt is resting comfortably in bed.  He denies shortness of breath chest pain fevers chills nausea or vomiting.  He tells me his vision is much improved.  Assessment & Plan:  LGIB - likely diverticular Severe pan-diverticulosis on colo - as per GI "NG lavage negative - CT angio negative on admission - follow up tagged RBC scan suggested bleeding from rectosigmoid colon - unprepped unsedated lower endoscopy... pancolonic diverticulosis noted and brown stools" - appears to have stabilized for now   Acute blood loss anemia - hemorrhagic shock  Shock resolved - Hgb stable - follow trend   Recent Labs Lab 09/06/16 2050 09/07/16 0400 09/07/16 1600 09/08/16 0929 09/09/16 0444  HGB 7.7* 7.2* 9.6* 9.5* 9.2*    Acute B blindness - B subacute occipital CVAs Stroke Team is following and directing care/evaluation of this issue   NSTEMI due to bleeding  Trop peaked at ~17 - not a candidate for cardiac cath per Cardiology   ESRD Nephrology attending to HD   Cirrhosis of liver Incidentally noted on imaging   Chronic systolic CHF Volume management per HD - no signif volume  overload on exam at this time   Chronic Afib Previously on Eliquis - s/p KCentra - coumadin now being started    DM2 CBG well controlled  Severe PVD  Vascular dementia  DVT prophylaxis: SCDs Code Status: FULL CODE Family Communication: no family present at time of exam  Disposition Plan: SDU  Consultants:  Pulmonary Neurology  [facility]   Antimicrobials:  Rocephin 7/21 > 7/23  Objective: Blood pressure (!) 144/98, pulse (!) 101, temperature 98.8 F (37.1 C), temperature source Oral, resp. rate 20, height 6' (1.829 Burgess), weight 79.4 kg (175 lb), SpO2 97 %.  Intake/Output Summary (Last 24 hours) at 09/09/16 1736 Last data filed at 09/09/16 1234  Gross per 24 hour  Intake              250 ml  Output              120 ml  Net              130 ml   Filed Weights   09/08/16 1154 09/08/16 1205 09/09/16 0302  Weight: 74.6 kg (164 lb 7.4 oz) 74.4 kg (164 lb 0.4 oz) 79.4 kg (175 lb)    Examination: General: No acute respiratory distress Lungs: Clear to auscultation bilaterally without wheezes or crackles Cardiovascular: mildly tachycardic without murmur gallop or rub  Abdomen: Nontender, nondistended, soft, bowel sounds positive, no rebound, no ascites, no appreciable mass Extremities: No significant  cyanosis, clubbing, or edema bilateral lower extremities  CBC:  Recent Labs Lab 09/04/16 0627  09/06/16 1600 09/06/16 2050 09/07/16 0400 09/07/16 1600 09/08/16 0929 09/09/16 0444  WBC 11.8*  < > 14.2* 14.5* 13.0*  --  13.2* 11.8*  NEUTROABS 9.0*  --  12.2* 12.6* 11.0*  --  11.0*  --   HGB 6.3*  < > 8.1* 7.7* 7.2* 9.6* 9.5* 9.2*  HCT 18.7*  < > 23.6* 21.8* 21.2* 28.6* 28.0* 28.0*  MCV 87.8  < > 84.6 84.8 86.5  --  86.7 86.2  PLT 210  < > 125* 117* 118*  --  158 172  < > = values in this interval not displayed. Basic Metabolic Panel:  Recent Labs Lab 09/04/16 1127 09/05/16 0304 09/05/16 1523 09/05/16 1642 09/06/16 0330 09/07/16 0405 09/08/16 0929  09/09/16 0444  NA 139 139 140  138  --  139 137 134* 133*  K 4.1 5.6* 4.5  4.4  --  4.4 4.4 4.4 3.6  CL 109 111 111  108  --  106 105 103 100*  CO2 20* 21* 20*  20*  --  25 25 23 24   GLUCOSE 183* 130* 117*  115*  --  109* 88 87 83  BUN 28* 35* 35*  35*  --  25* 25* 27* 13  CREATININE 2.96* 3.43* 3.65*  3.71*  --  2.57* 2.88* 3.40* 2.60*  CALCIUM 7.2* 6.3* 7.9*  7.9*  --  8.1* 8.1* 8.3* 8.1*  MG 1.5* 1.2*  --  1.6* 1.9  --  2.0  --   PHOS 3.0 3.2 3.4  --  3.2  --  3.2  --    GFR: Estimated Creatinine Clearance: 30.7 mL/min (A) (by C-G formula based on SCr of 2.6 mg/dL (H)).  Liver Function Tests:  Recent Labs Lab 09/04/16 0627 09/05/16 0430 09/05/16 1523 09/08/16 0929  AST 22  --   --  38  ALT 18  --   --  18  ALKPHOS 66  --   --  55  BILITOT 0.4  --   --  1.5*  PROT 4.7*  --   --  5.6*  ALBUMIN 2.5* 2.6* 2.9* 2.9*    Coagulation Profile:  Recent Labs Lab 09/04/16 0627 09/04/16 1428 09/05/16 0304 09/09/16 1100  INR 1.61 1.03 1.33 1.26    Cardiac Enzymes:  Recent Labs Lab 09/05/16 1642 09/05/16 2252 09/06/16 0330 09/08/16 0929 09/09/16 0444  TROPONINI 8.67* 9.70* 11.80* 16.69* 16.97*    CBG:  Recent Labs Lab 09/08/16 2311 09/09/16 0335 09/09/16 0728 09/09/16 1230 09/09/16 1628  GLUCAP 86 79 77 67 120*    Recent Results (from the past 240 hour(s))  Culture, blood (Routine x 2)     Status: None (Preliminary result)   Collection Time: 09/04/16  2:12 AM  Result Value Ref Range Status   Specimen Description BLOOD BLOOD RIGHT FOREARM  Final   Special Requests IN PEDIATRIC BOTTLE Blood Culture adequate volume  Final   Culture   Final    NO GROWTH 4 DAYS Performed at [facility], [place] [address] [place], [place] [address]    Report Status PENDING  Incomplete  Culture, blood (Routine x 2)     Status: None   Collection Time: 09/04/16  6:15 AM  Result Value Ref Range Status   Specimen Description BLOOD RIGHT HAND  Final   Special  Requests   Final    BOTTLES DRAWN AEROBIC AND ANAEROBIC Blood  Culture adequate volume   Culture   Final    NO GROWTH 5 DAYS Performed at [facility], [place] [address], [place], [place] [address]    Report Status 09/09/2016 FINAL  Final  MRSA PCR Screening     Status: None   Collection Time: 09/04/16  9:27 AM  Result Value Ref Range Status   MRSA by PCR NEGATIVE NEGATIVE Final    Comment:        The GeneXpert MRSA Assay (FDA approved for NASAL specimens only), is one component of a comprehensive MRSA colonization surveillance program. It is not intended to diagnose MRSA infection nor to guide or monitor treatment for MRSA infections.      Scheduled Meds: . atorvastatin  80 mg Oral q1800  . heparin lock flush  500 Units Intracatheter Once  . insulin aspart  0-9 Units Subcutaneous Q4H  . metoprolol tartrate  12.5 mg Oral BID  . pantoprazole  40 mg Intravenous Q12H  . patient's guide to using coumadin book   Does not apply Once  . sodium chloride flush  10-40 mL Intracatheter Q12H  . warfarin  2.5 mg Oral ONCE-1800  . warfarin   Does not apply Once  . Warfarin - Pharmacist Dosing Inpatient   Does not apply q1800     LOS: 5 days   Cherene Altes, MD [facility] Office  [phone] Pager - Text Page per Amion as per below:  On-Call/Text Page:      [email or web address]      password [email or web address]  If 7PM-7AM, please contact night-coverage [email or web address] Password [facility] 09/09/2016, 5:36 PM

## 2016-09-09 NOTE — Progress Notes (Signed)
PT Cancellation Note  Patient Details Name: George Burgess MRN: [id number] DOB: 11-20-1950   Cancelled Treatment:    Reason Eval/Treat Not Completed: Medical issues which prohibited therapy Pt on strict bedrest. Will await increase in activity orders prior to PT evaluation.   Marguarite Arbour A Jamecia Lerman 09/09/2016, 11:57 AM Wray Kearns, PT, DPT [phone]

## 2016-09-09 NOTE — Care Management Important Message (Signed)
Important Message  Patient Details  Name: George Burgess MRN: [id number] Date of Birth: 11/06/1950   Medicare Important Message Given:  Yes    Nathen May 09/09/2016, 8:57 AM

## 2016-09-09 NOTE — Progress Notes (Signed)
ANTICOAGULATION CONSULT NOTE - Initial Consult  Pharmacy Consult for warfarin Indication: atrial fibrillation  No Known Allergies  Patient Measurements: Height: 6' (182.9 cm) Weight: 175 lb (79.4 kg) IBW/kg (Calculated) : 77.6  Vital Signs: Temp: 98.3 F (36.8 C) (07/23 1100) Temp Source: Oral (07/23 0729) BP: 137/96 (07/23 0729) Pulse Rate: 106 (07/23 0729)  Labs:  Recent Labs  09/07/16 0400 09/07/16 0405 09/07/16 1600 09/08/16 0929 09/09/16 0444 09/09/16 1100  HGB 7.2*  --  9.6* 9.5* 9.2*  --   HCT 21.2*  --  28.6* 28.0* 28.0*  --   PLT 118*  --   --  158 172  --   LABPROT  --   --   --   --   --  15.9*  INR  --   --   --   --   --  1.26  CREATININE  --  2.88*  --  3.40* 2.60*  --   TROPONINI  --   --   --  16.69* 16.97*  --     Estimated Creatinine Clearance: 30.7 mL/min (A) (by C-G formula based on SCr of 2.6 mg/dL (H)).     Assessment: 66 yo male with a history of CVA and afib on Eliquis PTA. Patient was admitted 7/18 with a life-threatening GI bleed and received K-Centra, Feiba, and PRBCs. Eliquis was held at this time. Patient is ESRD and is no longer a candidate for DOACs. Pharmacy has now been consulted to start warfarin for atrial fibrillation due to likely stroke causing acute blindness on 7/21. GI cleared patient for aspirin and potential other anticoagulation with risk/benefit analysis as of yesterday. H/H at this time is low but stable and platelets are trending up. INR today is 1.26. No signs of further bleeding noted today.  Goal of Therapy:  INR 2-3 Monitor platelets by anticoagulation protocol: Yes   Plan:  Warfarin 2.5 mg X 1 tonight Daily INR Monitor closely for signs/symptoms of bleeding and dose conservatively   Susa Raring , PharmD PGY2 Infectious Diseases Pharmacy Resident Pager: [phone]  09/09/2016,12:09 PM

## 2016-09-09 NOTE — Evaluation (Signed)
Clinical/Bedside Swallow Evaluation Patient Details  Name: George Burgess MRN: [id number] Date of Birth: Aug 31, 1950  Today's Date: 09/09/2016 Time: SLP Start Time (ACUTE ONLY): 1150 SLP Stop Time (ACUTE ONLY): 1212 SLP Time Calculation (min) (ACUTE ONLY): 22 min  Past Medical History:  Past Medical History:  Diagnosis Date  . Atrial fibrillation ([place])    a. Chronic Eliquis (CHA2DS2VASc = 6).  . Chronic combined systolic (congestive) and diastolic (congestive) heart failure ([place])    a. Previously reported EF of 30%;  b. 08/2016 Echo: EF 40-45%, antsept, inf, infsept HK, Gr3 DD, mod AI/MR, sev dil LA, mild TR, PASP 58mmHg.  Marland Kitchen Dementia   . Encephalopathy   . End stage renal disease ([place])    a. On HD.  Marland Kitchen Essential hypertension   . GIB (gastrointestinal bleeding)    a. 08/2016 in setting of Eliquis Rx.  . Pleural effusion   . PVD (peripheral vascular disease) ([place])    a. s/p L BKA;  b. Chronic LE wounds.  . Stroke ([place])   . Type II diabetes mellitus ([place])    Past Surgical History:  Past Surgical History:  Procedure Laterality Date  . BELOW KNEE LEG AMPUTATION Left    HPI:  66 year old male with a complicated past medical history (prior CVA, dementia) here with a lower GI bleed in the setting of anticoagulation use. He has ESRD, CHF. Continues to bleed as of 7/20. He has been confused throughout his hospital stay but complains of blindness on 7/21 which he reports started 7/20 in the evening. ddx PRES vs acute stroke (watershed vs embolic). As of 7/22 the blindness is improving. MRI shows Acute embolic infarctions throughout the cerebellum and both cerebral hemispheres consistent with cardiac or ascending aortic origin. Largest regions of acute infarction are in the parieto-occipital regions bilaterally.    Assessment / Plan / Recommendation Clinical Impression  Pt demonstrates adequate swallow function, no difficulty swallowing or masticating solids. Pt will need assist with meals  due to new visual deficits. SLP will sign off for swallowing.  SLP Visit Diagnosis: Dysphagia, unspecified (R13.10)    Aspiration Risk       Diet Recommendation Regular;Thin liquid   Liquid Administration via: Cup;Straw Medication Administration: Whole meds with liquid Supervision: Staff to assist with self feeding Compensations: Minimize environmental distractions Postural Changes: Seated upright at 90 degrees    Other  Recommendations     Follow up Recommendations        Frequency and Duration            Prognosis        Swallow Study   General HPI: 66 year old male with a complicated past medical history (prior CVA, dementia) here with a lower GI bleed in the setting of anticoagulation use. He has ESRD, CHF. Continues to bleed as of 7/20. He has been confused throughout his hospital stay but complains of blindness on 7/21 which he reports started 7/20 in the evening. ddx PRES vs acute stroke (watershed vs embolic). As of 7/22 the blindness is improving. MRI shows Acute embolic infarctions throughout the cerebellum and both cerebral hemispheres consistent with cardiac or ascending aortic origin. Largest regions of acute infarction are in the parieto-occipital regions bilaterally.  Type of Study: Bedside Swallow Evaluation Previous Swallow Assessment: none Diet Prior to this Study: Regular;Thin liquids Temperature Spikes Noted: No Respiratory Status: Room air History of Recent Intubation: No Behavior/Cognition: Alert;Cooperative;Requires cueing Oral Cavity Assessment: Within Functional Limits Oral Care Completed by SLP: No  Oral Cavity - Dentition: Missing dentition Vision: Functional for self-feeding Self-Feeding Abilities: Able to feed self;Needs set up (partially blind) Patient Positioning: Upright in bed Baseline Vocal Quality: Hoarse Volitional Cough: Strong Volitional Swallow: Able to elicit    Oral/Motor/Sensory Function Overall Oral Motor/Sensory Function:  Within functional limits   Ice Chips     Thin Liquid Thin Liquid: Within functional limits Presentation: Cup;Straw;Self Fed    Nectar Thick Nectar Thick Liquid: Not tested   Honey Thick Honey Thick Liquid: Not tested   Puree Puree: Within functional limits   Solid   GO   Solid: Within functional limits       [facility], MA CCC-SLP [phone]  Lynann Beaver 09/09/2016,12:21 PM

## 2016-09-09 NOTE — Progress Notes (Signed)
Patient passed bedside stroke swallow screen. RN notified. Taleigha Pinson, Rande Brunt, RN

## 2016-09-09 NOTE — NC FL2 (Signed)
[place] MEDICAID FL2 LEVEL OF CARE SCREENING TOOL     IDENTIFICATION  Patient Name: George Burgess Birthdate: [birth year] Sex: male Admission Date (Current Location): 09/04/2016  [facility] and [place] Number:  Herbalist and Address:  [facility], [place] [address] [place], [place] [address]      Provider Number: [id number]  Attending Physician Name and Address:  Juanito Doom, MD  Relative Name and Phone Number:       Current Level of Care: SNF Recommended Level of Care: [place] Prior Approval Number:    Date Approved/Denied:   PASRR Number:    Discharge Plan: SNF    Current Diagnoses: Patient Active Problem List   Diagnosis Date Noted  . Blindness   . Bloody stools   . Encounter for nasogastric (NG) tube placement   . Lower GI bleed   . Malnutrition of moderate degree 09/05/2016  . Acute GI bleeding 09/04/2016    Orientation RESPIRATION BLADDER Height & Weight     Self  Normal Incontinent, External catheter Weight: 175 lb (79.4 kg) Height:  6' (182.9 cm)  BEHAVIORAL SYMPTOMS/MOOD NEUROLOGICAL BOWEL NUTRITION STATUS      Incontinent Diet  AMBULATORY STATUS COMMUNICATION OF NEEDS Skin   Extensive Assist Verbally Normal                       Personal Care Assistance Level of Assistance  Bathing, Dressing Bathing Assistance: Maximum assistance   Dressing Assistance: Maximum assistance     Functional Limitations Info             SPECIAL CARE FACTORS FREQUENCY  PT (By licensed PT), OT (By licensed OT)     PT Frequency: 5/wk OT Frequency: 5/wk            Contractures      Additional Factors Info  Code Status, Allergies, Insulin Sliding Scale Code Status Info: FULL Allergies Info: NKA   Insulin Sliding Scale Info: 6/day       Current Medications (09/09/2016):  This is the current hospital active medication list Current Facility-Administered Medications  Medication Dose Route  Frequency Provider Last Rate Last Dose  . 0.9 %  sodium chloride infusion   Intravenous Once Marshell Garfinkel, MD   Stopped at 09/05/16 1015  . atorvastatin (LIPITOR) tablet 80 mg  80 mg Oral q1800 Rogelia Mire, NP   80 mg at 09/07/16 1702  . cefTRIAXone (ROCEPHIN) 1 g in dextrose 5 % 50 mL IVPB  1 g Intravenous Daily Armbruster, Renelda Loma, MD 100 mL/hr at 09/09/16 1127 1 g at 09/09/16 1127  . fentaNYL (SUBLIMAZE) injection 25-50 mcg  25-50 mcg Intravenous Q2H PRN Mannam, Praveen, MD   50 mcg at 09/05/16 2204  . heparin lock flush 100 unit/mL  500 Units Intracatheter Once Tanna Furry, MD      . insulin aspart (novoLOG) injection 0-9 Units  0-9 Units Subcutaneous Q4H Erick Colace, NP   1 Units at 09/08/16 0404  . metoprolol tartrate (LOPRESSOR) injection 2.5-5 mg  2.5-5 mg Intravenous Q3H PRN Anders Simmonds, MD   2.5 mg at 09/05/16 2203  . metoprolol tartrate (LOPRESSOR) tablet 12.5 mg  12.5 mg Oral BID Rogelia Mire, NP   12.5 mg at 09/08/16 2213  . pantoprazole (PROTONIX) injection 40 mg  40 mg Intravenous Q12H Mannam, Praveen, MD   40 mg at 09/08/16 2213  . phenylephrine (NEOSYNEPHRINE) 10-0.9 MG/250ML-% infusion  0-400  mcg/min Intravenous Titrated Erick Colace, NP   Stopped at 09/04/16 1855  . sodium chloride flush (NS) 0.9 % injection 10-40 mL  10-40 mL Intracatheter Q12H Flora Lipps, MD   10 mL at 09/08/16 2214  . sodium chloride flush (NS) 0.9 % injection 10-40 mL  10-40 mL Intracatheter PRN Flora Lipps, MD   10 mL at 09/06/16 1113     Discharge Medications: Please see discharge summary for a list of discharge medications.  Relevant Imaging Results:  Relevant Lab Results:   Additional Information SS#: [id number]  Jorge Ny, LCSW

## 2016-09-09 NOTE — Progress Notes (Addendum)
R femoral CVC d/c'd per MD order, catheter intact upon removal. Pressure held X 5 min and pressure dressing applied.  Pt tolerated well.  Will monitor.

## 2016-09-09 NOTE — Evaluation (Signed)
Speech Language Pathology Evaluation Patient Details Name: George Burgess MRN: [id number] DOB: 03/31/1950 Today's Date: 09/09/2016 Time: 1601-0932 SLP Time Calculation (min) (ACUTE ONLY): 22 min  Problem List:  Patient Active Problem List   Diagnosis Date Noted  . Blindness   . Bloody stools   . Encounter for nasogastric (NG) tube placement   . Lower GI bleed   . Malnutrition of moderate degree 09/05/2016  . Acute GI bleeding 09/04/2016   Past Medical History:  Past Medical History:  Diagnosis Date  . Atrial fibrillation ([place])    a. Chronic Eliquis (CHA2DS2VASc = 6).  . Chronic combined systolic (congestive) and diastolic (congestive) heart failure ([place])    a. Previously reported EF of 30%;  b. 08/2016 Echo: EF 40-45%, antsept, inf, infsept HK, Gr3 DD, mod AI/MR, sev dil LA, mild TR, PASP 18mmHg.  Marland Kitchen Dementia   . Encephalopathy   . End stage renal disease ([place])    a. On HD.  Marland Kitchen Essential hypertension   . GIB (gastrointestinal bleeding)    a. 08/2016 in setting of Eliquis Rx.  . Pleural effusion   . PVD (peripheral vascular disease) ([place])    a. s/p L BKA;  b. Chronic LE wounds.  . Stroke ([place])   . Type II diabetes mellitus ([place])    Past Surgical History:  Past Surgical History:  Procedure Laterality Date  . BELOW KNEE LEG AMPUTATION Left    HPI:  66 year old male with a complicated past medical history (prior CVA, dementia) here with a lower GI bleed in the setting of anticoagulation use. He has ESRD, CHF. Continues to bleed as of 7/20. He has been confused throughout his hospital stay but complains of blindness on 7/21 which he reports started 7/20 in the evening. ddx PRES vs acute stroke (watershed vs embolic). As of 7/22 the blindness is improving. MRI shows Acute embolic infarctions throughout the cerebellum and both cerebral hemispheres consistent with cardiac or ascending aortic origin. Largest regions of acute infarction are in the parieto-occipital regions  bilaterally.    Assessment / Plan / Recommendation Clinical Impression  Pt presents with moderate cognitive deficits that are likely consistent with baseline dementia. Pts language, speech and arousal appear WFL. Primary deficits will be related to problem solving and awarneess needed to compensate for new visual impairment. Pt may benefit from assistance for SLP at SNF level to aid in basic compensatory strategies for memory and safety awareness. No further acute SLP needs. Will sign off.     SLP Assessment  SLP Recommendation/Assessment: All further Speech Lanaguage Pathology  needs can be addressed in the next venue of care SLP Visit Diagnosis: Frontal lobe and executive function deficit Frontal lobe and executive function deficit following: Other cerebrovascular disease    Follow Up Recommendations  Skilled Nursing facility    Frequency and Duration           SLP Evaluation Cognition  Overall Cognitive Status: History of cognitive impairments - at baseline Arousal/Alertness: Awake/alert Orientation Level: Oriented to person;Oriented to place;Disoriented to situation;Disoriented to time Attention: Focused;Sustained Focused Attention: Appears intact Sustained Attention: Appears intact Memory: Impaired Memory Impairment: Storage deficit;Retrieval deficit Awareness: Impaired Awareness Impairment: Intellectual impairment Problem Solving: Impaired Problem Solving Impairment: Functional basic;Verbal basic Safety/Judgment: Impaired       Comprehension  Auditory Comprehension Overall Auditory Comprehension: Appears within functional limits for tasks assessed Yes/No Questions: Within Functional Limits Commands: Within Functional Limits Conversation: Simple Reading Comprehension Reading Status: Unable to assess (comment)  Expression Verbal Expression Overall Verbal Expression: Impaired Initiation: No impairment Automatic Speech: Name;Social Response;Counting Level of  Generative/Spontaneous Verbalization: Phrase Repetition: No impairment Naming: Impairment Responsive: 51-75% accurate Confrontation: Within functional limits Convergent: 50-74% accurate Verbal Errors: Phonemic paraphasias (may be cultural pronunciation difference) Pragmatics: No impairment   Oral / Motor  Oral Motor/Sensory Function Overall Oral Motor/Sensory Function: Within functional limits Motor Speech Overall Motor Speech: Appears within functional limits for tasks assessed   GO                   Herbie [place], MA CCC-SLP [phone]   Lynann Beaver 09/09/2016, 12:33 PM

## 2016-09-09 NOTE — Progress Notes (Signed)
[facility] Progress Note  Subjective: no problems, says vision is better but still doesn't appear to see well.  No SOB/ cough, eating ok per pt.   Vitals:   09/09/16 0400 09/09/16 0500 09/09/16 0600 09/09/16 0729  BP: 116/73 135/90 (!) 127/91 (!) 137/96  Pulse: (!) 103 (!) 106 (!) 106 (!) 106  Resp: (!) 22 20 15 17   Temp:    98.6 F (37 C)  TempSrc:    Oral  SpO2: 100% 100% 100% 100%  Weight:      Height:        Inpatient medications: . atorvastatin  80 mg Oral q1800  . heparin lock flush  500 Units Intracatheter Once  . insulin aspart  0-9 Units Subcutaneous Q4H  . metoprolol tartrate  12.5 mg Oral BID  . pantoprazole  40 mg Intravenous Q12H  . sodium chloride flush  10-40 mL Intracatheter Q12H   . sodium chloride Stopped (09/05/16 1015)  . sodium chloride    . sodium chloride    . cefTRIAXone (ROCEPHIN)  IV Stopped (09/08/16 1002)  . phenylephrine (NEO-SYNEPHRINE) Adult infusion Stopped (09/04/16 1855)   sodium chloride, sodium chloride, alteplase, fentaNYL (SUBLIMAZE) injection, heparin, lidocaine (PF), lidocaine-prilocaine, metoprolol tartrate, pentafluoroprop-tetrafluoroeth, sodium chloride flush   Physical Exam: GEN cachectic, NAD HEENT sclerae anicteric.  Did not make eye contact with me; per nursing can track just a little bit NECK no JVD PULM unlabored, diminished at bases CV tachycardic, no m/r/g ABD nontender GU: no more BRBPR EXT s/p L BKA, R leg with 2 ulcers, dressed on the dorsum of the foot and the R shin.  R foot is cool  NEURO sluggish pupillary reflex ACCESS: R IJ permcath  CXR 7/21 - CHF, CM  Dialysis orders: [facility] /  TTS 4h   3K/ 3Ca bath  EDW 159 lbs  350/800  T IJ TDC  Hep 3800  Impression: 1 Hemorrhagic shock 2/2 acute lower GIB: likely diverticular; colonoscopy showing severe pan-diverticulosis Transfuse PRN.  Per PCCM and GI.  2  NSTEMI: likely demand from ABLA.  Not a candidate for cath per cardiology c/s note  7/20. 3  Afib on Eliquis: received KCentra, got  FEIBA. No longer candidate for DOAC 4  ESRD: TTS.  [facility]. HD Sunday, next Tuesday. Hold heparin w/ HD d/t GIB for now 5  Volume: EDW 159 lbs. CHF on last CXR 7/21.  UF as tol tomorrow on HD 6  HTN: BP's low on admission due to GIB/ shock. Holding home lisinopril/ lopressor, resume prn.  7  Anemia of ESRD/ ABLA: getting blood products, will give iron and ESA as appropriate 8  Metabolic Bone Disease: will place on binders and VDRA as appropriate 9  Bilateral subacute occipital CVA: no hemorrhage? Posterior circulation insufficiency vs embolic.  Neuro following.  TTE performed Verified by RN at [facility] 7/21; he was not blind before.  Needs vascular imaging (CTA vs MRA most likely).  Of note, has a frontal meningioma as well.   Plan - HD Tuesday, no heparin, max UF as tol.    George Splinter MD [facility] pager [phone]   09/09/2016, 9:20 AM    Recent Labs Lab 09/05/16 1523 09/06/16 0330 09/07/16 0405 09/08/16 0929 09/09/16 0444  NA 140  138 139 137 134* 133*  K 4.5  4.4 4.4 4.4 4.4 3.6  CL 111  108 106 105 103 100*  CO2 20*  20* 25 25 23  24  GLUCOSE 117*  115* 109* 88 87 83  BUN 35*  35* 25* 25* 27* 13  CREATININE 3.65*  3.71* 2.57* 2.88* 3.40* 2.60*  CALCIUM 7.9*  7.9* 8.1* 8.1* 8.3* 8.1*  PHOS 3.4 3.2  --  3.2  --     Recent Labs Lab 09/04/16 0627 09/05/16 0430 09/05/16 1523 09/08/16 0929  AST 22  --   --  38  ALT 18  --   --  18  ALKPHOS 66  --   --  55  BILITOT 0.4  --   --  1.5*  PROT 4.7*  --   --  5.6*  ALBUMIN 2.5* 2.6* 2.9* 2.9*    Recent Labs Lab 09/06/16 2050 09/07/16 0400 09/07/16 1600 09/08/16 0929 09/09/16 0444  WBC 14.5* 13.0*  --  13.2* 11.8*  NEUTROABS 12.6* 11.0*  --  11.0*  --   HGB 7.7* 7.2* 9.6* 9.5* 9.2*  HCT 21.8* 21.2* 28.6* 28.0* 28.0*  MCV 84.8 86.5  --  86.7 86.2  PLT 117* 118*  --  158 172   Iron/TIBC/Ferritin/ %Sat No results found for:  IRON, TIBC, FERRITIN, IRONPCTSAT

## 2016-09-09 NOTE — Clinical Social Work Note (Signed)
Clinical Social Work Assessment  Patient Details  Name: George Burgess MRN: [id number] Date of Birth: [birth year]  Date of referral:  09/09/16               Reason for consult:  Facility Placement                Permission sought to share information with:    Permission granted to share information::  No  Name::        Agency::  [facility]  Relationship::     Contact Information:     Housing/Transportation Living arrangements for the past 2 months:  [place] of Information:  Facility Patient Interpreter Needed:  None Criminal Activity/Legal Involvement Pertinent to Current Situation/Hospitalization:  No - Comment as needed Significant Relationships:  None Lives with:  Facility Resident Do you feel safe going back to the place where you live?  Yes Need for family participation in patient care:  Yes (Comment) (needs decision maker but none available)  Care giving concerns:  Pt is long term care resident at [facility] no care concerns with facility at this time.   Social Worker assessment / plan:  CSW trying to confirm care plan for patient.  Per medical notes unable to get a hold of family member with active involvement.  CSW spoke with facility that confirms pt does not have family that are involved with care.  States pt was completely oriented when admitted to their facility 6/21 and able to sign himself in- pt has declined since then with multiple admissions and now not oriented enough to make decisions for himself.  Facility and dialysis center social workers are working on getting pt guardianship.  Employment status:  Retired Forensic scientist:  Medicare PT Recommendations:  Not assessed at this time Information / Referral to community resources:     Patient/Family's Response to care:  [facility] agreeable to take patient back- patients intention was to stay there for long term care.  Patient/Family's Understanding of and Emotional Response to  Diagnosis, Current Treatment, and Prognosis:  No questions or concerns from facility- hopeful they can obtain guardianship so patient will have competent caregiver.  Emotional Assessment Appearance:  Appears stated age Attitude/Demeanor/Rapport:  Unable to Assess Affect (typically observed):  Unable to Assess Orientation:  Oriented to Self Alcohol / Substance use:  Not Applicable Psych involvement (Current and /or in the community):  No (Comment)  Discharge Needs  Concerns to be addressed:  Care Coordination Readmission within the last 30 days:  No Current discharge risk:  Physical Impairment Barriers to Discharge:  Continued Medical Work up   Jorge Ny, LCSW 09/09/2016, 11:54 AM

## 2016-09-09 NOTE — Progress Notes (Signed)
OT Cancellation Note  Patient Details Name: George Burgess MRN: [id number] DOB: [birth year]   Cancelled Treatment:    Reason Eval/Treat Not Completed: Medical issues which prohibited therapy.   Pt on strict bedrest.  Will reattempt  Lucille Passy, OTR/L [phone]   Lucille Passy M 09/09/2016, 1:29 PM

## 2016-09-10 DIAGNOSIS — I482 Chronic atrial fibrillation: Secondary | ICD-10-CM

## 2016-09-10 DIAGNOSIS — D62 Acute posthemorrhagic anemia: Secondary | ICD-10-CM

## 2016-09-10 DIAGNOSIS — F0150 Vascular dementia without behavioral disturbance: Secondary | ICD-10-CM

## 2016-09-10 DIAGNOSIS — D329 Benign neoplasm of meninges, unspecified: Secondary | ICD-10-CM

## 2016-09-10 DIAGNOSIS — I5022 Chronic systolic (congestive) heart failure: Secondary | ICD-10-CM

## 2016-09-10 DIAGNOSIS — Z992 Dependence on renal dialysis: Secondary | ICD-10-CM

## 2016-09-10 DIAGNOSIS — J90 Pleural effusion, not elsewhere classified: Secondary | ICD-10-CM

## 2016-09-10 DIAGNOSIS — K7469 Other cirrhosis of liver: Secondary | ICD-10-CM

## 2016-09-10 DIAGNOSIS — R578 Other shock: Secondary | ICD-10-CM

## 2016-09-10 LAB — CBC
HEMATOCRIT: 30.5 % — AB (ref 39.0–52.0)
Hemoglobin: 9.9 g/dL — ABNORMAL LOW (ref 13.0–17.0)
MCH: 28.4 pg (ref 26.0–34.0)
MCHC: 32.5 g/dL (ref 30.0–36.0)
MCV: 87.4 fL (ref 78.0–100.0)
Platelets: 199 10*3/uL (ref 150–400)
RBC: 3.49 MIL/uL — AB (ref 4.22–5.81)
RDW: 15.7 % — ABNORMAL HIGH (ref 11.5–15.5)
WBC: 13.3 10*3/uL — AB (ref 4.0–10.5)

## 2016-09-10 LAB — GLUCOSE, CAPILLARY
GLUCOSE-CAPILLARY: 134 mg/dL — AB (ref 65–99)
GLUCOSE-CAPILLARY: 233 mg/dL — AB (ref 65–99)
GLUCOSE-CAPILLARY: 88 mg/dL (ref 65–99)
Glucose-Capillary: 326 mg/dL — ABNORMAL HIGH (ref 65–99)
Glucose-Capillary: 145 mg/dL — ABNORMAL HIGH (ref 65–99)

## 2016-09-10 LAB — BASIC METABOLIC PANEL
ANION GAP: 9 (ref 5–15)
BUN: 16 mg/dL (ref 6–20)
CALCIUM: 8 mg/dL — AB (ref 8.9–10.3)
CHLORIDE: 99 mmol/L — AB (ref 101–111)
CO2: 25 mmol/L (ref 22–32)
Creatinine, Ser: 3.21 mg/dL — ABNORMAL HIGH (ref 0.61–1.24)
GFR calc non Af Amer: 19 mL/min — ABNORMAL LOW (ref >60)
GFR, EST AFRICAN AMERICAN: 22 mL/min — AB (ref >60)
Glucose, Bld: 82 mg/dL (ref 65–99)
Potassium: 3.9 mmol/L (ref 3.5–5.1)
Sodium: 133 mmol/L — ABNORMAL LOW (ref 135–145)

## 2016-09-10 LAB — CULTURE, BLOOD (ROUTINE X 2)
Culture: NO GROWTH
SPECIAL REQUESTS: ADEQUATE

## 2016-09-10 LAB — PROTIME-INR
INR: 1.23
Prothrombin Time: 15.6 seconds — ABNORMAL HIGH (ref 11.4–15.2)

## 2016-09-10 LAB — MAGNESIUM: MAGNESIUM: 1.8 mg/dL (ref 1.7–2.4)

## 2016-09-10 LAB — TROPONIN I: Troponin I: 10.04 ng/mL (ref <0.03)

## 2016-09-10 MED ORDER — WARFARIN SODIUM 5 MG PO TABS
2.5000 mg | ORAL_TABLET | Freq: Once | ORAL | Status: AC
  Administered 2016-09-10: 2.5 mg via ORAL
  Filled 2016-09-10: qty 1

## 2016-09-10 NOTE — Progress Notes (Signed)
PROGRESS NOTE    Arnet Hofferber  XVQ:[id number] DOB: Jun 23, 1950 DOA: 09/04/2016 PCP: Caprice Renshaw, MD   Brief Narrative:  Lagi.[facility] BM PMHx Chronic A-Fib on DOAC, Chronic Systolic CHF (EF 09%), prior CVA, HTN, Vascular dementia, , DM type 2, PVD,V prior Left BKA, and chronic LE wounds who resides at a SNF.   Presented from [facility] 7/18 after having 2 large volume maroon colored stools and BP in the 70s. On arrival to ED he was awake. His initial Hgb was 6.3. He was given K centra and 2 units of blood in the ED but remained hypotensive. Because of this PCCM asked to admit.   66 year old male w/ sig h/o: CAF (on DOAC), HFrEF (EF 30%), prior CVA, vascular dementia, HTN, diabetes type II,  Bilateral Pleural effusions, PVD, prior Left BKA, chronic LE wounds. Resides at SNF (has no HCPOA). Presented from SNF 6/18 after having 2 large Volume maroon colored stools and BP in 70s. On arrival to ED he was awake. Remained hypotensive w/ SBP in 70s but would increase w/ fluid and blood resuscitation. His initial Hgb was 6.3. He was given K centra, received 2 units of blood In ED but remained hypotensive. Because of this PCCM asked to admit.    Subjective: 7/24  A/O 2 (does not know where, when, however did know was in [place]), negative CP, negative SOB, negative N/V. Patient currently feeding himself.   Assessment & Plan:   Active Problems:   Acute GI bleeding   Malnutrition of moderate degree   Lower GI bleed   Blindness   Bloody stools   Encounter for nasogastric (NG) tube placement   LGIB  - likely diverticular:Severe pan-diverticulosis on colo  - as per GI "NG lavage negative  - CT angio negative on admission  - follow up tagged RBC scan suggested bleeding from rectosigmoid colon  - unprepped unsedated lower endoscopy... pancolonic diverticulosis noted and brown stools" - appears to have stabilized for now   Acute blood loss anemia/hemorrhagic shock  -Shock resolved - Hgb stable - follow  trend   Recent Labs Lab 09/06/16 2050 09/07/16 0400 09/07/16 1600 09/08/16 0929 09/09/16 0444 09/10/16 0609  HGB 7.7* 7.2* 9.6* 9.5* 9.2* 9.9*   Acute/Subacute Bilateral Parietal- Occipital CVA -PT/OT recommends SNF -Consult to social work begin process of finding SNF specializing in neuro rehabilitation  Acute Bilateral Blindness  -Stroke Team is following and directing care/evaluation of this issue  -Warfarin per stroke team Recent Labs Lab 09/04/16 0627 09/04/16 1428 09/05/16 0304 09/09/16 1100 09/10/16 0609  INR 1.61 1.03 1.33 1.26 1.23    Right Frontal Meningioma -See stroke  NSTEMI due to bleeding  Trop peaked at ~17 - not a candidate for cardiac cath per Cardiology   Chronic Afib -Previously on Eliquis - s/p KCentra - coumadin now being started    Severe PVD   ESRD ON HD T/TH/Sat Nephrology attending to HD   Cirrhosis of liver -Incidentally noted on imaging  -Acute hepatitis panel negative  Chronic systolic CHF Volume management per HD - no signif volume overload on exam at this time   Bilateral pleural effusion -Asymptomatic -Fluid management per HD  DM type 2 -Hemoglobin A1c pending CBG well controlled   Vascular dementia -Stable    DVT prophylaxis: Warfarin per pharmacy Code Status: Full BMP Family Communication: None Disposition Plan: SNF   Consultants:  Pulmonary Neurology  [facility]     Procedures/Significant Events:  7/18 CT angiogram abdomen pelvis W/W contrast:VASCULAR -  Negative active GI bleed-Small penetrating atherosclerotic ulcer arising from the posteroinferior aspect of the right common iliac artery. NON-VASCULAR - Focal hypoenhancement in the lower pole of the left kidney irregular cyst, vs region of focal pyelonephritis.  -Scattered sigmoid colonic diverticula. -Anasarca, large right and moderate left pleural effusions.  -Morphologic features of hepatic cirrhosis.  7/18 nuclear med RBC  scan: Subtle tracer activity suggested within the lower pelvis likely within the rectosigmoid that may represent the site of reported gastrointestinal bleeding 7/18 Admit - R femoral CVL 7/19 Rt  HD cath inserted - CVVH initiated until stabilized and able to transfer to [facility] for HD 7/21 colonoscopy > no blood seen 7/21 CT head W contrast:-Subacute infarctions in both parieto-occipital regions, Lt >> Rt Mild swelling but no evidence of mass effect or shift. -Right frontal convexity meningioma. 7/22 transferred to [facility] from [place]  7/22 MRI brain Wo contrast: -Acute embolic infarctions throughout the cerebellum and both cerebral hemispheres consistent with cardiac or ascending aortic origin. Largest regions of acute infarction are in the parieto-occipital regions bilaterally. -Old left parietal cortical and subcortical infarction. -Old small vessel infarctions of the deep white matter. -Right frontal convexity meningioma without significant mass effect. 7/23 TRH assumed care  7/23 CT angiogram head W/Wo contrast:No emergent large vessel occlusion. 2. No high-grade stenosis or aneurysm of the intracranial arteries. 3. No potential embolic source identified. The carotid systems and aortic arch are free of atherosclerotic disease. In this context, echocardiography is recommended to assess for potential intracardiac thrombus. 4. Bioccipital hypoattenuation consistent with known acute infarcts. 5. Large bilateral pleural effusions.    VENTILATOR SETTINGS:    Cultures   Antimicrobials: Anti-infectives    Start     Stop   09/07/16 0900  cefTRIAXone (ROCEPHIN) 1 g in dextrose 5 % 50 mL IVPB  Status:  Discontinued     09/09/16 1802       Devices None   LINES / TUBES:  None    Continuous Infusions:   Objective: Vitals:   09/10/16 0711 09/10/16 0719 09/10/16 0724 09/10/16 0730  BP: 133/90 (!) 129/91  (!) 132/95  Pulse:  (!) 101  (!) 101  Resp:  19  16  Temp: 97.6 F  (36.4 C)     TempSrc: Oral     SpO2: 100% 100% 100% 100%  Weight: 167 lb 15.9 oz (76.2 kg)     Height:        Intake/Output Summary (Last 24 hours) at 09/10/16 0813 Last data filed at 09/10/16 0600  Gross per 24 hour  Intake              240 ml  Output              375 ml  Net             -135 ml   Filed Weights   09/09/16 0302 09/10/16 0500 09/10/16 0711  Weight: 175 lb (79.4 kg) 168 lb (76.2 kg) 167 lb 15.9 oz (76.2 kg)    Examination:  General: A/O 2 (does not know where, when, however did know was in [place]) No acute respiratory distress Eyes: negative scleral hemorrhage, negative anisocoria, negative icterus ENT: Negative Runny nose, negative gingival bleeding, Neck:  Negative scars, masses, torticollis, lymphadenopathy, JVD Lungs: Clear to auscultation bilaterally without wheezes or crackles Cardiovascular: Regular rate and rhythm without murmur gallop or rub normal S1 and S2 Abdomen: negative abdominal pain, nondistended, positive soft, bowel sounds, no rebound, no ascites, no  appreciable mass Extremities: No significant cyanosis, clubbing. Left BKA, negative lower extremity edema. 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  Skin: Negative rashes, lesions, ulcers Psychiatric:  Negative depression, negative anxiety, negative fatigue, negative mania  Central nervous system:  Cranial nerves II through XII intact, tongue/uvula midline, all extremities muscle strength 5/5, sensation intact throughout,  unable to perform finger nose finger bilateral  quick finger touch bilateral within normal limits (for this patient who has chronic contractures in his hand) , negative dysarthria, negative expressive aphasia, negative receptive aphasia. Patient with decreased vision able to make out shapes and shades, unable to pick out fine features.  .     Data Reviewed: Care during the described time interval was provided by me .  I have reviewed  this patient's available data, including medical history, events of note, physical examination, and all test results as part of my evaluation. I have personally reviewed and interpreted all radiology studies.  CBC:  Recent Labs Lab 09/04/16 0627  09/06/16 1600 09/06/16 2050 09/07/16 0400 09/07/16 1600 09/08/16 0929 09/09/16 0444 09/10/16 0609  WBC 11.8*  < > 14.2* 14.5* 13.0*  --  13.2* 11.8* 13.3*  NEUTROABS 9.0*  --  12.2* 12.6* 11.0*  --  11.0*  --   --   HGB 6.3*  < > 8.1* 7.7* 7.2* 9.6* 9.5* 9.2* 9.9*  HCT 18.7*  < > 23.6* 21.8* 21.2* 28.6* 28.0* 28.0* 30.5*  MCV 87.8  < > 84.6 84.8 86.5  --  86.7 86.2 87.4  PLT 210  < > 125* 117* 118*  --  158 172 199  < > = values in this interval not displayed. Basic Metabolic Panel:  Recent Labs Lab 09/04/16 1127 09/05/16 0304 09/05/16 1523 09/05/16 1642 09/06/16 0330 09/07/16 0405 09/08/16 0929 09/09/16 0444  NA 139 139 140  138  --  139 137 134* 133*  K 4.1 5.6* 4.5  4.4  --  4.4 4.4 4.4 3.6  CL 109 111 111  108  --  106 105 103 100*  CO2 20* 21* 20*  20*  --  25 25 23 24   GLUCOSE 183* 130* 117*  115*  --  109* 88 87 83  BUN 28* 35* 35*  35*  --  25* 25* 27* 13  CREATININE 2.96* 3.43* 3.65*  3.71*  --  2.57* 2.88* 3.40* 2.60*  CALCIUM 7.2* 6.3* 7.9*  7.9*  --  8.1* 8.1* 8.3* 8.1*  MG 1.5* 1.2*  --  1.6* 1.9  --  2.0  --   PHOS 3.0 3.2 3.4  --  3.2  --  3.2  --    GFR: Estimated Creatinine Clearance: 30.1 mL/min (A) (by C-G formula based on SCr of 2.6 mg/dL (H)). Liver Function Tests:  Recent Labs Lab 09/04/16 0627 09/05/16 0430 09/05/16 1523 09/08/16 0929  AST 22  --   --  38  ALT 18  --   --  18  ALKPHOS 66  --   --  55  BILITOT 0.4  --   --  1.5*  PROT 4.7*  --   --  5.6*  ALBUMIN 2.5* 2.6* 2.9* 2.9*   No results for input(s): LIPASE, AMYLASE in the last 168 hours. No results for input(s): AMMONIA in the last 168 hours. Coagulation Profile:  Recent Labs Lab 09/04/16 0627 09/04/16 1428  09/05/16 0304 09/09/16 1100 09/10/16 0609  INR 1.61 1.03 1.33 1.26 1.23   Cardiac Enzymes:  Recent Labs  Lab 09/05/16 2252 09/06/16 0330 09/08/16 0929 09/09/16 0444 09/10/16 0609  TROPONINI 9.70* 11.80* 16.69* 16.97* 10.04*   BNP (last 3 results) No results for input(s): PROBNP in the last 8760 hours. HbA1C: No results for input(s): HGBA1C in the last 72 hours. CBG:  Recent Labs Lab 09/09/16 0335 09/09/16 0728 09/09/16 1230 09/09/16 1628 09/09/16 2032  GLUCAP 79 77 67 120* 112*   Lipid Profile:  Recent Labs  09/08/16 0543  CHOL 97  HDL 40*  LDLCALC 44  TRIG 64  CHOLHDL 2.4   Thyroid Function Tests: No results for input(s): TSH, T4TOTAL, FREET4, T3FREE, THYROIDAB in the last 72 hours. Anemia Panel: No results for input(s): VITAMINB12, FOLATE, FERRITIN, TIBC, IRON, RETICCTPCT in the last 72 hours. Urine analysis:    Component Value Date/Time   COLORURINE YELLOW 09/09/2016 0045   APPEARANCEUR CLOUDY (A) 09/09/2016 0045   LABSPEC 1.017 09/09/2016 0045   PHURINE 5.0 09/09/2016 0045   GLUCOSEU NEGATIVE 09/09/2016 0045   HGBUR NEGATIVE 09/09/2016 0045   BILIRUBINUR NEGATIVE 09/09/2016 0045   KETONESUR NEGATIVE 09/09/2016 0045   PROTEINUR 100 (A) 09/09/2016 0045   NITRITE NEGATIVE 09/09/2016 0045   LEUKOCYTESUR TRACE (A) 09/09/2016 0045   Sepsis Labs: @LABRCNTIP (procalcitonin:4,lacticidven:4)  ) Recent Results (from the past 240 hour(s))  Culture, blood (Routine x 2)     Status: None (Preliminary result)   Collection Time: 09/04/16  2:12 AM  Result Value Ref Range Status   Specimen Description BLOOD BLOOD RIGHT FOREARM  Final   Special Requests IN PEDIATRIC BOTTLE Blood Culture adequate volume  Final   Culture   Final    NO GROWTH 4 DAYS Performed at [facility], [place] [address], [place], [place] [address]    Report Status PENDING  Incomplete  Culture, blood (Routine x 2)     Status: None   Collection Time: 09/04/16  6:15 AM  Result Value  Ref Range Status   Specimen Description BLOOD RIGHT HAND  Final   Special Requests   Final    BOTTLES DRAWN AEROBIC AND ANAEROBIC Blood Culture adequate volume   Culture   Final    NO GROWTH 5 DAYS Performed at [facility], [address], [place], [place] [address]    Report Status 09/09/2016 FINAL  Final  MRSA PCR Screening     Status: None   Collection Time: 09/04/16  9:27 AM  Result Value Ref Range Status   MRSA by PCR NEGATIVE NEGATIVE Final    Comment:        The GeneXpert MRSA Assay (FDA approved for NASAL specimens only), is one component of a comprehensive MRSA colonization surveillance program. It is not intended to diagnose MRSA infection nor to guide or monitor treatment for MRSA infections.          Radiology Studies: Ct Angio Head W Or Wo Contrast  Result Date: 09/09/2016 CLINICAL DATA:  Stroke.  Multiple embolic infarctions. EXAM: CT ANGIOGRAPHY HEAD AND NECK TECHNIQUE: Multidetector CT imaging of the head and neck was performed using the standard protocol during bolus administration of intravenous contrast. Multiplanar CT image reconstructions and MIPs were obtained to evaluate the vascular anatomy. Carotid stenosis measurements (when applicable) are obtained utilizing NASCET criteria, using the distal internal carotid diameter as the denominator. CONTRAST:  50 mL Isovue 370 COMPARISON:  Brain MRI 09/08/2016 FINDINGS: CT HEAD FINDINGS Brain: There is hypoattenuation within both occipital lobes at sites of known infarcts. The numerous foci of ischemia identified elsewhere on the  recent MRI brain are not clearly visible on this study. Chronic left parietal encephalomalacia is unchanged. Right frontal convexity meningioma. Vascular: No hyperdense vessel or unexpected calcification. Skull: Normal visualized skull base, calvarium and extracranial soft tissues. Sinuses/Orbits: No sinus fluid levels or advanced mucosal thickening. No mastoid effusion. Normal orbits.  CTA NECK FINDINGS Aortic arch: There is no aneurysm or dissection of the visualized ascending aorta or aortic arch. There is a normal variant aortic arch branching pattern with the brachiocephalic and left common carotid arteries sharing a common origin. The visualized proximal subclavian arteries are normal. Right carotid system: The right common carotid origin is widely patent. There is no common carotid or internal carotid artery dissection or aneurysm. No hemodynamically significant stenosis. Left carotid system: The left common carotid origin is widely patent. There is no common carotid or internal carotid artery dissection or aneurysm. No hemodynamically significant stenosis. Vertebral arteries: The vertebral system is codominant. Both vertebral artery origins are normal. Both vertebral arteries are normal to their confluence with the basilar artery. Skeleton: Multilevel degenerative disc disease without bony spinal canal stenosis. Other neck: The nasopharynx is clear. The oropharynx and hypopharynx are normal. The epiglottis is normal. The supraglottic larynx, glottis and subglottic larynx are normal. No retropharyngeal collection. The parapharyngeal spaces are preserved. The parotid and submandibular glands are normal. No sialolithiasis or salivary ductal dilatation. The thyroid gland is normal. There is no cervical lymphadenopathy. Upper chest: Large bilateral pleural effusions. Review of the MIP images confirms the above findings CTA HEAD FINDINGS Anterior circulation: --Intracranial internal carotid arteries: Normal. --Anterior cerebral arteries: Normal. --Middle cerebral arteries: Normal. --Posterior communicating arteries: Absent bilaterally. Posterior circulation: --Posterior cerebral arteries: Normal. --Superior cerebellar arteries: Normal. --Basilar artery: Normal. --Anterior inferior cerebellar arteries: Normal. --Posterior inferior cerebellar arteries: Normal. Venous sinuses: As permitted by  contrast timing, patent. Anatomic variants: None Delayed phase: No parenchymal contrast enhancement. Review of the MIP images confirms the above findings IMPRESSION: 1. No emergent large vessel occlusion. 2. No high-grade stenosis or aneurysm of the intracranial arteries. 3. No potential embolic source identified. The carotid systems and aortic arch are free of atherosclerotic disease. In this context, echocardiography is recommended to assess for potential intracardiac thrombus. 4. Bioccipital hypoattenuation consistent with known acute infarcts. 5. Large bilateral pleural effusions. Electronically Signed   By: Ulyses Jarred M.D.   On: 09/09/2016 00:58   Ct Angio Neck W Or Wo Contrast  Result Date: 09/09/2016 CLINICAL DATA:  Stroke.  Multiple embolic infarctions. EXAM: CT ANGIOGRAPHY HEAD AND NECK TECHNIQUE: Multidetector CT imaging of the head and neck was performed using the standard protocol during bolus administration of intravenous contrast. Multiplanar CT image reconstructions and MIPs were obtained to evaluate the vascular anatomy. Carotid stenosis measurements (when applicable) are obtained utilizing NASCET criteria, using the distal internal carotid diameter as the denominator. CONTRAST:  50 mL Isovue 370 COMPARISON:  Brain MRI 09/08/2016 FINDINGS: CT HEAD FINDINGS Brain: There is hypoattenuation within both occipital lobes at sites of known infarcts. The numerous foci of ischemia identified elsewhere on the recent MRI brain are not clearly visible on this study. Chronic left parietal encephalomalacia is unchanged. Right frontal convexity meningioma. Vascular: No hyperdense vessel or unexpected calcification. Skull: Normal visualized skull base, calvarium and extracranial soft tissues. Sinuses/Orbits: No sinus fluid levels or advanced mucosal thickening. No mastoid effusion. Normal orbits. CTA NECK FINDINGS Aortic arch: There is no aneurysm or dissection of the visualized ascending aorta or aortic  arch. There is a normal variant aortic arch  branching pattern with the brachiocephalic and left common carotid arteries sharing a common origin. The visualized proximal subclavian arteries are normal. Right carotid system: The right common carotid origin is widely patent. There is no common carotid or internal carotid artery dissection or aneurysm. No hemodynamically significant stenosis. Left carotid system: The left common carotid origin is widely patent. There is no common carotid or internal carotid artery dissection or aneurysm. No hemodynamically significant stenosis. Vertebral arteries: The vertebral system is codominant. Both vertebral artery origins are normal. Both vertebral arteries are normal to their confluence with the basilar artery. Skeleton: Multilevel degenerative disc disease without bony spinal canal stenosis. Other neck: The nasopharynx is clear. The oropharynx and hypopharynx are normal. The epiglottis is normal. The supraglottic larynx, glottis and subglottic larynx are normal. No retropharyngeal collection. The parapharyngeal spaces are preserved. The parotid and submandibular glands are normal. No sialolithiasis or salivary ductal dilatation. The thyroid gland is normal. There is no cervical lymphadenopathy. Upper chest: Large bilateral pleural effusions. Review of the MIP images confirms the above findings CTA HEAD FINDINGS Anterior circulation: --Intracranial internal carotid arteries: Normal. --Anterior cerebral arteries: Normal. --Middle cerebral arteries: Normal. --Posterior communicating arteries: Absent bilaterally. Posterior circulation: --Posterior cerebral arteries: Normal. --Superior cerebellar arteries: Normal. --Basilar artery: Normal. --Anterior inferior cerebellar arteries: Normal. --Posterior inferior cerebellar arteries: Normal. Venous sinuses: As permitted by contrast timing, patent. Anatomic variants: None Delayed phase: No parenchymal contrast enhancement. Review of the  MIP images confirms the above findings IMPRESSION: 1. No emergent large vessel occlusion. 2. No high-grade stenosis or aneurysm of the intracranial arteries. 3. No potential embolic source identified. The carotid systems and aortic arch are free of atherosclerotic disease. In this context, echocardiography is recommended to assess for potential intracardiac thrombus. 4. Bioccipital hypoattenuation consistent with known acute infarcts. 5. Large bilateral pleural effusions. Electronically Signed   By: Ulyses Jarred M.D.   On: 09/09/2016 00:58   Mr Brain Wo Contrast  Result Date: 09/08/2016 CLINICAL DATA:  Became unresponsive yesterday.  New onset blindness. EXAM: MRI HEAD WITHOUT CONTRAST TECHNIQUE: Multiplanar, multiecho pulse sequences of the brain and surrounding structures were obtained without intravenous contrast. COMPARISON:  CT 09/07/2016 FINDINGS: Brain: Diffusion imaging shows acute infarctions scattered throughout all vascular distributions consistent with embolic disease from the heart or ascending aorta. There are scattered subcentimeter infarctions affecting both cerebellar hemispheres. No brainstem infarction. There are scattered infarctions within both cerebral hemispheres, the largest in the parieto-occipital regions. None show hemorrhage. There is old infarction in the left parietal cortical and subcortical brain. There are extensive chronic small vessel changes of the hemispheric white matter. No sign of mass effect or shift. No hydrocephalus. No extra-axial fluid collection. As shown by CT, there is a right frontal convexity meningioma measuring approximately 3.4 cm in diameter with a thickness of 15 mm. No significant mass effect upon the brain. Vascular: Major vessels at the base of the brain show flow. Skull and upper cervical spine: Negative Sinuses/Orbits: Clear/ normal.  Small mastoid effusions. Other: None significant IMPRESSION: Acute embolic infarctions throughout the cerebellum and  both cerebral hemispheres consistent with cardiac or ascending aortic origin. Largest regions of acute infarction are in the parieto-occipital regions bilaterally. No sign of hemorrhagic transformation. No mass effect or shift. Old left parietal cortical and subcortical infarction. Old small vessel infarctions of the deep white matter. Right frontal convexity meningioma without significant mass effect. Electronically Signed   By: Nelson Chimes M.D.   On: 09/08/2016 18:48  Scheduled Meds: . atorvastatin  80 mg Oral q1800  . insulin aspart  0-5 Units Subcutaneous QHS  . insulin aspart  0-9 Units Subcutaneous TID WC  . isosorbide mononitrate  30 mg Oral Daily  . metoprolol tartrate  25 mg Oral BID  . pantoprazole  40 mg Oral Q1200  . patient's guide to using coumadin book   Does not apply Once  . warfarin   Does not apply Once  . Warfarin - Pharmacist Dosing Inpatient   Does not apply q1800   Continuous Infusions:   LOS: 6 days    Time spent: 40 minutes    Braydan Marriott, Geraldo Docker, MD [facility] Pager [phone]   If 7PM-7AM, please contact night-coverage [email or web address] Password [facility] 09/10/2016, 8:13 AM

## 2016-09-10 NOTE — Evaluation (Signed)
Occupational Therapy Evaluation Patient Details Name: George Burgess MRN: [id number] DOB: 07/27/1950 Today's Date: 09/10/2016    History of Present Illness Patient is a 66 y/o male who presents from SNF due to having 2 large volume maroon colored stools and BP in the 70s. Pt's Hgb was 6.3; was given K centra and 2 units of blood in the ED but remained hypotensive. Admitted for hemorhaggic shock, NSTEMI and acute anemia. MRI- Acute bilateral cerebrum and cerebellar infarctions, with the largest in the parieto-occipital regions bilaterally. PMH includes chronic Afib, chronic systolic CHF (EF 82%), prior CVA, vascular dementia, HTN, DM2, PVD, prior Left BKA, and chronic LE wounds    Clinical Impression   Pt admitted with above. He demonstrates the below listed deficits and will benefit from continued OT to maximize safety and independence with BADLs.  Pt requires min - max A for ADLs.  He will spontaneously utilize residual vision coupled with tactile input to locate items in his environment.  He is long term resident of SNF, and plan is to return to SNF.         Follow Up Recommendations  SNF;Supervision/Assistance - 24 hour    Equipment Recommendations  None recommended by OT    Recommendations for Other Services       Precautions / Restrictions Precautions Precautions: Fall Precaution Comments: ; cortically blind  Restrictions Weight Bearing Restrictions: No      Mobility Bed Mobility Overal bed mobility: Needs Assistance Bed Mobility: Supine to Sit     Supine to sit: Min guard;HOB elevated     General bed mobility comments: Able to get to EOB with verbal directional cues to compensate for vision. No physical assist needed.  Transfers Overall transfer level: Needs assistance Equipment used: None Transfers: Lateral/Scoot Transfers          Lateral/Scoot Transfers: Min guard General transfer comment: Able to scoot transfer to chair with verbal/tactile directional  cues and assist to locate chair to compensate for visual deficits. No physical assist needed.    Balance Overall balance assessment: Needs assistance Sitting-balance support: Feet supported;No upper extremity supported Sitting balance-Leahy Scale: Fair Sitting balance - Comments: Able to sit EOB unsupported.    Standing balance support: Bilateral upper extremity supported;Single extremity supported Standing balance-Leahy Scale: Good                             ADL either performed or assessed with clinical judgement   ADL Overall ADL's : Needs assistance/impaired Eating/Feeding: Modified independent;Bed level Eating/Feeding Details (indicate cue type and reason): Pt utilizes residual vision and tactile input to locate items on tray  Grooming: Wash/dry hands;Wash/dry face;Oral care;Brushing hair;Set up;Supervision/safety;Sitting   Upper Body Bathing: Minimal assistance;Sitting   Lower Body Bathing: Moderate assistance;Sitting/lateral leans;Bed level   Upper Body Dressing : Maximal assistance;Sitting   Lower Body Dressing: Maximal assistance;Bed level;Sitting/lateral leans   Toilet Transfer: Minimal assistance;Squat-pivot;BSC   Toileting- Clothing Manipulation and Hygiene: Moderate assistance;Sitting/lateral lean       Functional mobility during ADLs: Minimal assistance;Min guard       Vision   Additional Comments: Pt unable to participate in formal assessment due to severity of visual loss and cognitive deficits.  He will sporadically detect movement.  He can insconistently identify colors.  He consistently, spontaneously shifts gaze to utilize his residual vision to locate items      Perception Perception Perception Tested?: Yes;No   Praxis Praxis Praxis tested?: Within functional limits  Pertinent Vitals/Pain Pain Assessment: Faces Faces Pain Scale: No hurt     Hand Dominance     Extremity/Trunk Assessment Upper Extremity Assessment Upper  Extremity Assessment: RUE deficits/detail;LUE deficits/detail;Generalized weakness RUE Deficits / Details: wasting of bil. intrinsics noted.   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   Cervical / Trunk Assessment Cervical / Trunk Assessment: Normal   Communication     Cognition Arousal/Alertness: Awake/alert Behavior During Therapy: WFL for tasks assessed/performed Overall Cognitive Status: History of cognitive impairments - at baseline                                 General Comments: Pt with h/o dementia.  Now with new visual deficits    General Comments  Pt unable to provide info re: PLOF    Exercises     Shoulder Instructions      Home Living Family/patient expects to be discharged to:: Skilled nursing facility   Available Help at Discharge: [place] Type of Home: [place]                           Additional Comments: Pt is long term resident       Prior Functioning/Environment          Comments: Pt from [facility], not an accurate historian. Reports using w/c for mobility and transferring independently.         OT Problem List: Impaired balance (sitting and/or standing);Impaired vision/perception;Decreased cognition;Decreased safety awareness      OT Treatment/Interventions: Self-care/ADL training;DME and/or AE instruction;Therapeutic activities;Cognitive remediation/compensation;Visual/perceptual remediation/compensation;Patient/family education;Balance training    OT Goals(Current goals can be found in the care plan section) Acute Rehab OT Goals Patient Stated Goal: none stated OT Goal Formulation: With patient Time For Goal Achievement: 09/24/16 Potential to Achieve Goals: Good ADL Goals Pt  Will Perform Grooming: with modified independence;sitting Pt Will Perform Upper Body Bathing: with supervision;sitting Pt Will Perform Lower Body Bathing: with min assist;sit to/from stand Pt Will Transfer to Toilet: with min guard assist;squat pivot transfer;bedside commode  OT Frequency: Min 2X/week   Barriers to D/C: Decreased caregiver support          Co-evaluation   Reason for Co-Treatment: For patient/therapist safety;To address functional/ADL transfers;Complexity of the patient's impairments (multi-system involvement)          AM-PAC PT "6 Clicks" Daily Activity     Outcome Measure Help from another person eating meals?: None Help from another person taking care of personal grooming?: A Little Help from another person toileting, which includes using toliet, bedpan, or urinal?: A Lot Help from another person bathing (including washing, rinsing, drying)?: A Little Help from another person to put on and taking off regular upper body clothing?: A Lot Help from another person to put on and taking off regular lower body clothing?: A Lot 6 Click Score: 16   End of Session Nurse Communication: Mobility status  Activity Tolerance: Patient tolerated treatment well Patient left: in chair;with call bell/phone within reach;with chair alarm set  OT Visit Diagnosis: Unsteadiness on feet (R26.81);Low vision, both eyes (H54.2)                Time: 3545-6256 OT Time Calculation (min):  24 min Charges:  OT General Charges $OT Visit: 1 Procedure OT Evaluation $OT Eval Moderate Complexity: 1 Procedure G-Codes:     Lucille Passy, OTR/L [phone]   Lucille Passy M 09/10/2016, 4:56 PM

## 2016-09-10 NOTE — Evaluation (Signed)
Physical Therapy Evaluation Patient Details Name: George Burgess MRN: [id number] DOB: 09/26/1950 Today's Date: 09/10/2016   History of Present Illness  Patient is a 66 y/o male who presents from SNF due to having 2 large volume maroon colored stools and BP in the 70s. Pt's Hgb was 6.3; was given K centra and 2 units of blood in the ED but remained hypotensive. Admitted for hemorhaggic shock, NSTEMI and acute anemia. MRI- Acute bilateral cerebrum and cerebellar infarctions, with the largest in the parieto-occipital regions bilaterally. PMH includes chronic Afib, chronic systolic CHF (EF 82%), prior CVA, vascular dementia, HTN, DM2, PVD, prior Left BKA, and chronic LE wounds   Clinical Impression  Patient presents with visual deficits, baseline cognitive deficits and impaired mobility s/p above. Pt from [facility] and plan is to return there at d/c. Pt requires max directional cues to navigate environment due to visual deficits. Able to transfer to chair with min guard assist with mod cues for safety. Anticipate pt is close to functional baseline but will need further skilled therapy to maximize independence and mobility.    Follow Up Recommendations SNF;Supervision for mobility/OOB (return to [facility])    Equipment Recommendations  None recommended by PT    Recommendations for Other Services       Precautions / Restrictions Precautions Precautions: Fall Precaution Comments: left BKA Restrictions Weight Bearing Restrictions: No      Mobility  Bed Mobility Overal bed mobility: Needs Assistance Bed Mobility: Supine to Sit     Supine to sit: Min guard;HOB elevated     General bed mobility comments: Able to get to EOB with verbal directional cues to compensate for vision. No physical assist needed.  Transfers Overall transfer level: Needs assistance Equipment used: None Transfers: Lateral/Scoot Transfers          Lateral/Scoot Transfers: Min guard General transfer  comment: Able to scoot transfer to chair with verbal/tactile directional cues and assist to locate chair to compensate for visual deficits. No physical assist needed.  Ambulation/Gait             General Gait Details: Deferred as pt non ambulatory at baseline.  Stairs            Wheelchair Mobility    Modified Rankin (Stroke Patients Only) Modified Rankin (Stroke Patients Only) Pre-Morbid Rankin Score: Moderately severe disability Modified Rankin: Moderately severe disability     Balance Overall balance assessment: Needs assistance Sitting-balance support: Feet supported;No upper extremity supported Sitting balance-Leahy Scale: Fair Sitting balance - Comments: Able to sit EOB unsupported.                                      Pertinent Vitals/Pain Pain Assessment: Faces Faces Pain Scale: No hurt    Home Living Family/patient expects to be discharged to:: Skilled nursing facility   Available Help at Discharge: [place] Type of Home: [place]                Prior Function           Comments: Pt from [facility], not an accurate historian. Reports using w/c for mobility and transferring independently.      Hand Dominance        Extremity/Trunk Assessment   Upper Extremity Assessment Upper Extremity Assessment: Defer to OT evaluation    Lower Extremity Assessment Lower Extremity Assessment: RLE deficits/detail RLE Deficits / Details:  Grossly ~3/5 throughout.       Communication      Cognition Arousal/Alertness: Awake/alert Behavior During Therapy: WFL for tasks assessed/performed Overall Cognitive Status: History of cognitive impairments - at baseline                                 General Comments: Pt with visual deficits. Able to problem solve and find items on tray when asked.       General Comments General comments (skin integrity, edema, etc.): VSS throughout.     Exercises     Assessment/Plan    PT Assessment Patient needs continued PT services  PT Problem List Decreased cognition;Decreased safety awareness;Decreased mobility;Decreased strength;Decreased balance       PT Treatment Interventions Therapeutic activities;Therapeutic exercise;Patient/family education;Wheelchair mobility training;Balance training;Functional mobility training    PT Goals (Current goals can be found in the Care Plan section)  Acute Rehab PT Goals Patient Stated Goal: none stated PT Goal Formulation: With patient Time For Goal Achievement: 09/24/16 Potential to Achieve Goals: Fair    Frequency Min 2X/week   Barriers to discharge        Co-evaluation PT/OT/SLP Co-Evaluation/Treatment: Yes Reason for Co-Treatment: For patient/therapist safety;To address functional/ADL transfers;Complexity of the patient's impairments (multi-system involvement)           AM-PAC PT "6 Clicks" Daily Activity  Outcome Measure Difficulty turning over in bed (including adjusting bedclothes, sheets and blankets)?: None Difficulty moving from lying on back to sitting on the side of the bed? : None Difficulty sitting down on and standing up from a chair with arms (e.g., wheelchair, bedside commode, etc,.)?: None Help needed moving to and from a bed to chair (including a wheelchair)?: A Little Help needed walking in hospital room?: Total Help needed climbing 3-5 steps with a railing? : Total 6 Click Score: 17    End of Session   Activity Tolerance: Patient tolerated treatment well Patient left: in chair;with call bell/phone within reach;with chair alarm set;Other (comment) (OT present in room) Nurse Communication: Mobility status PT Visit Diagnosis: Muscle weakness (generalized) (M62.81);Unsteadiness on feet (R26.81);Difficulty in walking, not elsewhere classified (R26.2)    Time: 5170-0174 PT Time Calculation (min) (ACUTE ONLY): 21 min   Charges:   PT Evaluation $PT Eval  Moderate Complexity: 1 Procedure     PT G Codes:        Wray Kearns, PT, DPT [phone]    Marguarite Arbour A Donne Baley 09/10/2016, 4:23 PM

## 2016-09-10 NOTE — Progress Notes (Signed)
ANTICOAGULATION CONSULT NOTE - Follow-Up Consult  Pharmacy Consult for warfarin Indication: atrial fibrillation  No Known Allergies  Patient Measurements: Height: 6' (182.9 cm) Weight: 167 lb 15.9 oz (76.2 kg) IBW/kg (Calculated) : 77.6  Vital Signs: Temp: 97.6 F (36.4 C) (07/24 0711) Temp Source: Oral (07/24 0711) BP: 123/83 (07/24 1000) Pulse Rate: 95 (07/24 1000)  Labs:  Recent Labs  09/08/16 0929 09/09/16 0444 09/09/16 1100 09/10/16 0609 09/10/16 0845  HGB 9.5* 9.2*  --  9.9*  --   HCT 28.0* 28.0*  --  30.5*  --   PLT 158 172  --  199  --   LABPROT  --   --  15.9* 15.6*  --   INR  --   --  1.26 1.23  --   CREATININE 3.40* 2.60*  --   --  3.21*  TROPONINI 16.69* 16.97*  --  10.04*  --     Estimated Creatinine Clearance: 24.4 mL/min (A) (by C-G formula based on SCr of 3.21 mg/dL (H)).     Assessment: 65 yo male with a history of CVA and afib on Eliquis PTA. Patient was admitted 7/18 with a life-threatening GI bleed. Eliquis was held at this time. Patient is ESRD and is no longer a candidate for DOACs. 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 GI cleared patient for aspirin and potential other anticoagulation with risk/benefit analysis as of 7/22. H/H at this time is low but stable and platelets are trending up. INR today is 1.23 after 1 dose of 2.5 mg. INR today is likely not reflective of warfarin dosing yet. No signs of bleeding noted today.   Goal of Therapy:  INR 2-3 Monitor platelets by anticoagulation protocol: Yes   Plan:  Repeat Warfarin 2.5 mg X 1 tonight Daily INR Monitor closely for signs/symptoms of bleeding and dose conservatively   Susa Raring , PharmD PGY2 Infectious Diseases Pharmacy Resident Pager: [phone]  09/10/2016,10:31 AM

## 2016-09-10 NOTE — Progress Notes (Signed)
[facility] Progress Note  Subjective: on HD, in good spirits, no c/o  Vitals:   09/10/16 1000 09/10/16 1030 09/10/16 1054 09/10/16 1100  BP: 123/83 118/78 135/88 117/81  Pulse: 95 91 95 95  Resp: 17 17 14 16   Temp:      TempSrc:      SpO2:   100% 100%  Weight:      Height:        Inpatient medications: . atorvastatin  80 mg Oral q1800  . insulin aspart  0-5 Units Subcutaneous QHS  . insulin aspart  0-9 Units Subcutaneous TID WC  . isosorbide mononitrate  30 mg Oral Daily  . metoprolol tartrate  25 mg Oral BID  . pantoprazole  40 mg Oral Q1200  . patient's guide to using coumadin book   Does not apply Once  . warfarin  2.5 mg Oral ONCE-1800  . warfarin   Does not apply Once  . Warfarin - Pharmacist Dosing Inpatient   Does not apply q1800    fentaNYL (SUBLIMAZE) injection, sodium chloride flush   Physical Exam: GEN cachectic, NAD HEENT sclerae anicteric.  Did not make eye contact with me; per nursing can track just a little bit NECK no JVD PULM unlabored, diminished at bases CV tachycardic, no m/r/g ABD nontender GU: no more BRBPR EXT s/p L BKA, R leg with 2 ulcers, dressed on the dorsum of the foot and the R shin.  R foot is cool  NEURO sluggish pupillary reflex ACCESS: R IJ permcath  CXR 7/21 - CHF, CM  Dialysis orders: [facility] /  TTS 4h   3K/ 3Ca bath  EDW 159 lbs  350/800  T IJ TDC  Hep 3800  Impression: 1 Hemorrhagic shock 2/2 acute lower GIB: likely diverticular; colonoscopy showing severe pan-diverticulosis Transfuse PRN.  Per PCCM and GI.  2  Acute bilat post CVA's - cerebellar/ parieto-occipital, per neuro complication of afib. +cortical blindness.  Neuro rec's switch Eliquis to coumadin given ESRD/ dialysis.  3  NSTEMI: likely demand from ABLA.  Not a candidate for cath per cardiology c/s note 7/20. 4  Afib on Eliquis: received KCentra, got  FEIBA. No longer candidate for DOAC 5  ESRD: TTS.  [facility]. HD Sunday, next  Tuesday. Hold heparin w/ HD d/t GIB for now 6  HTN: BP's low on admission due to GIB/ shock. Holding home lisinopril/ lopressor, resume prn.  7  Anemia of ESRD/ ABLA: getting blood products, will give iron and ESA as appropriate 8  Metabolic Bone Disease: will place on binders and VDRA as appropriate 9  Volume - +CHF last CXR on 7/21, tolerating 3L off today, down to dry weight now  Plan - HD today , as above   Kelly Splinter MD [facility] pager [phone]   09/10/2016, 11:46 AM    Recent Labs Lab 09/05/16 1523 09/06/16 0330  09/08/16 0929 09/09/16 0444 09/10/16 0845  NA 140  138 139  < > 134* 133* 133*  K 4.5  4.4 4.4  < > 4.4 3.6 3.9  CL 111  108 106  < > 103 100* 99*  CO2 20*  20* 25  < > 23 24 25   GLUCOSE 117*  115* 109*  < > 87 83 82  BUN 35*  35* 25*  < > 27* 13 16  CREATININE 3.65*  3.71* 2.57*  < > 3.40* 2.60* 3.21*  CALCIUM 7.9*  7.9* 8.1*  < > 8.3*  8.1* 8.0*  PHOS 3.4 3.2  --  3.2  --   --   < > = values in this interval not displayed.  Recent Labs Lab 09/04/16 0627 09/05/16 0430 09/05/16 1523 09/08/16 0929  AST 22  --   --  38  ALT 18  --   --  18  ALKPHOS 66  --   --  55  BILITOT 0.4  --   --  1.5*  PROT 4.7*  --   --  5.6*  ALBUMIN 2.5* 2.6* 2.9* 2.9*    Recent Labs Lab 09/06/16 2050 09/07/16 0400  09/08/16 0929 09/09/16 0444 09/10/16 0609  WBC 14.5* 13.0*  --  13.2* 11.8* 13.3*  NEUTROABS 12.6* 11.0*  --  11.0*  --   --   HGB 7.7* 7.2*  < > 9.5* 9.2* 9.9*  HCT 21.8* 21.2*  < > 28.0* 28.0* 30.5*  MCV 84.8 86.5  --  86.7 86.2 87.4  PLT 117* 118*  --  158 172 199  < > = values in this interval not displayed. Iron/TIBC/Ferritin/ %Sat No results found for: IRON, TIBC, FERRITIN, IRONPCTSAT

## 2016-09-10 NOTE — Progress Notes (Signed)
STROKE TEAM PROGRESS NOTE   HISTORY OF PRESENT ILLNESS (per record) George Burgess is a 66 y.o. male with a history of vascular dementia, atrial fibrillation, systolic/diastolic heart failure, encephalopathy, ESRD, essential hypertension, peripheral vascular disease, stroke, and type II diabetes mellitus who presents with hypotensive shock due to GI bleed. He has had some confusion since being hospitalized and was noted on 09/08/2016 to have developed cortical blindness.  It was confirmed with the nursing home that this is not his baseline. He states that it is been going on "for a while" but is unable to give me a better estimate.  On admission, his blood pressures were in the 60s, this is felt to be secondary to GI bleeding, radiotracer uptake suggested the rectosigmoid area, colonoscopy did not reveal any active bleeding. Gastrologist is suspecting diverticular bleed.  He was on CRT, but this is been discontinued and he is going to be transferred over to come for intermittent hemodialysis.    LKW: Unclear  Patient was not administered IV t-PA secondary to inability to establish time last known well. He was admitted to the ICU ror further evaluation and treatment.    SUBJECTIVE (INTERVAL HISTORY) His RN is at the bedside.   He states he can still see some shadows but cannot recognize faces.   OBJECTIVE Temp:  [97.5 F (36.4 C)-97.9 F (36.6 C)] 97.9 F (36.6 C) (07/24 1624) Pulse Rate:  [81-103] 100 (07/24 1624) Cardiac Rhythm: Normal sinus rhythm (07/24 1150) Resp:  [2-23] 17 (07/24 1624) BP: (113-138)/(77-104) 124/80 (07/24 1624) SpO2:  [95 %-100 %] 98 % (07/24 1205) Weight:  [159 lb (72.1 kg)-168 lb (76.2 kg)] 159 lb (72.1 kg) (07/24 1140)  CBC:  Recent Labs Lab 09/07/16 0400  09/08/16 0929 09/09/16 0444 09/10/16 0609  WBC 13.0*  --  13.2* 11.8* 13.3*  NEUTROABS 11.0*  --  11.0*  --   --   HGB 7.2*  < > 9.5* 9.2* 9.9*  HCT 21.2*  < > 28.0* 28.0* 30.5*  MCV 86.5  --  86.7  86.2 87.4  PLT 118*  --  158 172 199  < > = values in this interval not displayed.  Basic Metabolic Panel:  Recent Labs Lab 09/06/16 0330  09/08/16 0929 09/09/16 0444 09/10/16 0845  NA 139  < > 134* 133* 133*  K 4.4  < > 4.4 3.6 3.9  CL 106  < > 103 100* 99*  CO2 25  < > 23 24 25   GLUCOSE 109*  < > 87 83 82  BUN 25*  < > 27* 13 16  CREATININE 2.57*  < > 3.40* 2.60* 3.21*  CALCIUM 8.1*  < > 8.3* 8.1* 8.0*  MG 1.9  --  2.0  --  1.8  PHOS 3.2  --  3.2  --   --   < > = values in this interval not displayed.  Lipid Panel:     Component Value Date/Time   CHOL 97 09/08/2016 0543   TRIG 64 09/08/2016 0543   HDL 40 (L) 09/08/2016 0543   CHOLHDL 2.4 09/08/2016 0543   VLDL 13 09/08/2016 0543   LDLCALC 44 09/08/2016 0543   HgbA1c: No results found for: HGBA1C Urine Drug Screen: No results found for: LABOPIA, COCAINSCRNUR, LABBENZ, AMPHETMU, THCU, LABBARB  Alcohol Level No results found for: ETH  IMAGING  Ct Head Wo Contrast  09/07/2016 IMPRESSION: Subacute infarctions in both parieto-occipital regions, more extensive on the left than the right. Mild swelling but no  evidence of mass effect or shift. No hemorrhage. Extensive old ischemic changes elsewhere as outlined above. Right frontal convexity meningioma.   Ct Angio Neck W and Wo Contrast 09/09/2016 IMPRESSION: 1. No emergent large vessel occlusion. 2. No high-grade stenosis or aneurysm of the intracranial arteries. 3. No potential embolic source identified. The carotid systems and aortic arch are free of atherosclerotic disease. In this context, echocardiography is recommended to assess for potential intracardiac thrombus. 4. Bioccipital hypoattenuation consistent with known acute infarcts. 5. Large bilateral pleural effusions.   Mr Brain Wo Contrast 09/08/2016 IMPRESSION: Acute embolic infarctions throughout the cerebellum and both cerebral hemispheres consistent with cardiac or ascending aortic origin. Largest regions of acute  infarction are in the parieto-occipital regions bilaterally. No sign of hemorrhagic transformation. No mass effect or shift. Old left parietal cortical and subcortical infarction. Old small vessel infarctions of the deep white matter. Right frontal convexity meningioma without significant mass effect.  2D Echo 09/05/2016 Study Conclusions - Left ventricle: The cavity size was normal. There was moderate concentric hypertrophy. Systolic function was mildly to moderately reduced. The estimated ejection fraction was in the range of 40% to 45%. Hypokinesis of the anteroseptal, inferior, and inferoseptal myocardium. Doppler parameters are consistent with a reversible restrictive pattern, indicative of decreased left ventricular diastolic compliance and/or increased left atrial pressure (grade 3 diastolic dysfunction). Doppler parameters are consistent with high ventricular filling pressure. - Ventricular septum: The contour showed diastolic flattening and systolic flattening consistent with right ventricular pressure and volume overload. - Aortic valve: There was moderate regurgitation. Regurgitation pressure half-time: 390 ms. - Mitral valve: Transvalvular velocity was within the normal range.  There was no evidence for stenosis. There was moderate regurgitation. - Left atrium: The atrium was severely dilated. - Right ventricle: The cavity size was normal. Wall thickness was normal. Systolic function was normal. - Atrial septum: No defect or patent foramen ovale was identified by color flow Doppler. - Tricuspid valve: There was mild regurgitation. - Pulmonary arteries: Systolic pressure was severely increased. PA   peak pressure: 67 mm Hg (S). - Pericardium, extracardiac: A small pericardial effusion was   identified. LV EF: 40% -   45%   PHYSICAL EXAM Frail cachectic middle-aged African-American male currently not in distress. He has left below-knee amputation. . Afebrile. Head is nontraumatic. Neck  is supple without bruit.    Cardiac exam no murmur or gallop. Lungs are clear to auscultation. Distal pulses are well felt. Neurological Exam :  Awake alert oriented to place only. Patient is cortically blind but states that he can see my face and hand but is unable to describe any features. He has elements of Anton`s and Balint`s  Syndrome. He can follow simple midline and extremity commands. Pupils are small but reactive. He states he can perceive light but is not correcting given the direction. Fundi could not be visualized. Face is symmetric without weakness. Tongue is midline. Motor system exam able to move all 4 extremities equally well symmetrically against gravity. No focal weakness. Deep tendon reflexes are depressed. Left ankle jerk cannot be tested due to amputation. Right plantar downgoing and left cannot be tested. Gait deferred ASSESSMENT/PLAN Mr. Holley Wirt is a 66 y.o. male with history of vascular dementia, atrial fibrillation, systolic/diastolic heart failure, encephalopathy, ESRD, essential hypertension, peripheral vascular disease, stroke, and type II diabetes mellitus who presents with hypotensive shock due to GI bleed. He did not receive IV t-PA due to inability to establish time last known well.  Stroke: Acute bilateral cerebrum and cerebellar infarctions, with the largest in the parieto-occipital regions bilaterally, cardioembolic secondary to subtherapeutic anticoagulation on 2.5mg  daily dose Eliquis.  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  MRI head: Acute bilateral cerebrum and cerebellar infarctions, with the largest in the parieto-occipital regions bilaterally  MRA head  not ordered  CTA head/neck: No LVO or high-grade stenosis.  2D Echo: mild AR, MR, and TR.  EF 40-45%. No source of embolus  LDL 44  HgbA1c pending   Heparin  for VTE prophylaxis Diet renal with fluid restriction Fluid restriction: 2000 mL Fluid; Room service appropriate? 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 The patient had presented with GI bleed and was hypertensive but now has developed cortical blindness secondary to bilateral posterior and anterior cerebral circulation embolic infarcts likely from his atrial fibrillation. Recommend  continue warfarin for anticoagulation given his renal failure and dialysis.  . No family available at bedside for discussion.  Stroke team will sign off. Kindly call for questions. Antony Contras, MD Medical Director [facility] Pager: [phone] 09/10/2016 5:08 PM   To contact Stroke Continuity provider, please refer to [email or web address] After hours, contact General Neurology

## 2016-09-11 ENCOUNTER — Inpatient Hospital Stay (HOSPITAL_COMMUNITY): Payer: Medicare Other

## 2016-09-11 DIAGNOSIS — I63119 Cerebral infarction due to embolism of unspecified vertebral artery: Secondary | ICD-10-CM

## 2016-09-11 LAB — GLUCOSE, CAPILLARY
GLUCOSE-CAPILLARY: 117 mg/dL — AB (ref 65–99)
GLUCOSE-CAPILLARY: 136 mg/dL — AB (ref 65–99)
Glucose-Capillary: 141 mg/dL — ABNORMAL HIGH (ref 65–99)
Glucose-Capillary: 108 mg/dL — ABNORMAL HIGH (ref 65–99)
Glucose-Capillary: 154 mg/dL — ABNORMAL HIGH (ref 65–99)

## 2016-09-11 LAB — PROTIME-INR
INR: 1.3
Prothrombin Time: 16.3 seconds — ABNORMAL HIGH (ref 11.4–15.2)

## 2016-09-11 LAB — HEMOGLOBIN A1C
Hgb A1c MFr Bld: 5.1 % (ref 4.8–5.6)
Mean Plasma Glucose: 100 mg/dL

## 2016-09-11 MED ORDER — HEPARIN SODIUM (PORCINE) 1000 UNIT/ML DIALYSIS: 1000.0000 [IU] | INTRAMUSCULAR | Status: DC | PRN

## 2016-09-11 MED ORDER — SODIUM CHLORIDE 0.9 % IV SOLN: 100.0000 mL | INTRAVENOUS | Status: DC | PRN

## 2016-09-11 MED ORDER — ALTEPLASE 2 MG IJ SOLR: 2.0000 mg | Freq: Once | INTRAMUSCULAR | Status: DC | PRN

## 2016-09-11 MED ORDER — PENTAFLUOROPROP-TETRAFLUOROETH EX AERO: 1.0000 "application " | INHALATION_SPRAY | CUTANEOUS | Status: DC | PRN

## 2016-09-11 MED ORDER — LIDOCAINE HCL (PF) 1 % IJ SOLN: 5.0000 mL | INTRAMUSCULAR | Status: DC | PRN

## 2016-09-11 MED ORDER — LIDOCAINE-PRILOCAINE 2.5-2.5 % EX CREA: 1.0000 "application " | TOPICAL_CREAM | CUTANEOUS | Status: DC | PRN

## 2016-09-11 MED ORDER — WARFARIN SODIUM 4 MG PO TABS
4.0000 mg | ORAL_TABLET | Freq: Once | ORAL | Status: AC
  Administered 2016-09-11: 4 mg via ORAL
  Filled 2016-09-11: qty 1

## 2016-09-11 NOTE — Plan of Care (Signed)
Problem: Safety: Goal: Ability to remain free from injury will improve Outcome: Progressing Patient in low bed. Instructed to call for assistance. No injury noted at this time.

## 2016-09-11 NOTE — Progress Notes (Signed)
[facility] Progress Note  Subjective: on HD, in good spirits, no c/o  Vitals:   09/11/16 0500 09/11/16 0600 09/11/16 0726 09/11/16 0908  BP: (!) 102/59 100/64 123/81 120/85  Pulse: 95 96 99 (!) 101  Resp: 18 18 20    Temp:   98.8 F (37.1 C)   TempSrc:   Oral   SpO2: 98% 96% 95%   Weight: 73.5 kg (162 lb)     Height:        Inpatient medications: . atorvastatin  80 mg Oral q1800  . insulin aspart  0-5 Units Subcutaneous QHS  . insulin aspart  0-9 Units Subcutaneous TID WC  . isosorbide mononitrate  30 mg Oral Daily  . metoprolol tartrate  25 mg Oral BID  . pantoprazole  40 mg Oral Q1200  . patient's guide to using coumadin book   Does not apply Once  . warfarin  4 mg Oral ONCE-1800  . warfarin   Does not apply Once  . Warfarin - Pharmacist Dosing Inpatient   Does not apply q1800    fentaNYL (SUBLIMAZE) injection, sodium chloride flush   Physical Exam: GEN cachectic, NAD HEENT sclerae anicteric.  Did not make eye contact with me; per nursing can track just a little bit NECK no JVD PULM unlabored, diminished at bases CV tachycardic, no m/r/g ABD nontender GU: no more BRBPR EXT s/p L BKA, R leg with 2 ulcers, dressed on the dorsum of the foot and the R shin.  R foot is cool  NEURO sluggish pupillary reflex ACCESS: R IJ permcath  CXR 7/21 - CHF, CM  Dialysis orders: [facility] /  TTS 4h   3K/ 3Ca bath  EDW 159 lbs  350/800  T IJ TDC  Hep 3800 (holding for now d/t GIB)  Impression: 1 Hemorrhagic shock 2/2 acute lower GIB: likely diverticular; colonoscopy w/ severe pan-diverticulosis Transfuse PRN.  Per PCCM and GI.  2  Acute bilat CVA's - cerebellar/ parieto-occipital, per neuro complication of afib. +cortical blindness.  Per neuro recommends switch Eliquis to coumadin given ESRD/ dialysis.  3  NSTEMI: likely demand from ABLA.  Not a candidate for cath per cardiology c/s note 7/20. 4  Afib on Eliquis: received KCentra, got  FEIBA. No longer  candidate for DOAC. On MTP.  5  ESRD: TTS HD.  Hold heparin on HD for now 6  HTN: BP's soft, on home MTP here. Home lisinopril on hold, resume prn.  7  Anemia of ESRD/ ABLA: getting blood products, will give iron and ESA as appropriate 8  Metabolic Bone Disease: will place on binders and VDRA as appropriate 9  Volume - +CHF last CXR on 7/21, tolerating 3L off today, down to dry weight now  Plan - HD Thursday, max UF as Ronnie Derby MD [place] pager [phone]   09/11/2016, 10:56 AM    Recent Labs Lab 09/05/16 1523 09/06/16 0330  09/08/16 0929 09/09/16 0444 09/10/16 0845  NA 140  138 139  < > 134* 133* 133*  K 4.5  4.4 4.4  < > 4.4 3.6 3.9  CL 111  108 106  < > 103 100* 99*  CO2 20*  20* 25  < > 23 24 25   GLUCOSE 117*  115* 109*  < > 87 83 82  BUN 35*  35* 25*  < > 27* 13 16  CREATININE 3.65*  3.71* 2.57*  < > 3.40* 2.60* 3.21*  CALCIUM 7.9*  7.9* 8.1*  < > 8.3* 8.1* 8.0*  PHOS 3.4 3.2  --  3.2  --   --   < > = values in this interval not displayed.  Recent Labs Lab 09/05/16 0430 09/05/16 1523 09/08/16 0929  AST  --   --  38  ALT  --   --  18  ALKPHOS  --   --  55  BILITOT  --   --  1.5*  PROT  --   --  5.6*  ALBUMIN 2.6* 2.9* 2.9*    Recent Labs Lab 09/06/16 2050 09/07/16 0400  09/08/16 0929 09/09/16 0444 09/10/16 0609  WBC 14.5* 13.0*  --  13.2* 11.8* 13.3*  NEUTROABS 12.6* 11.0*  --  11.0*  --   --   HGB 7.7* 7.2*  < > 9.5* 9.2* 9.9*  HCT 21.8* 21.2*  < > 28.0* 28.0* 30.5*  MCV 84.8 86.5  --  86.7 86.2 87.4  PLT 117* 118*  --  158 172 199  < > = values in this interval not displayed. Iron/TIBC/Ferritin/ %Sat No results found for: IRON, TIBC, FERRITIN, IRONPCTSAT

## 2016-09-11 NOTE — Progress Notes (Signed)
ANTICOAGULATION CONSULT NOTE - Follow-Up Consult  Pharmacy Consult for warfarin Indication: atrial fibrillation  No Known Allergies  Patient Measurements: Height: 6' (182.9 cm) Weight: 162 lb (73.5 kg) IBW/kg (Calculated) : 77.6  Vital Signs: Temp: 98.8 F (37.1 C) (07/25 0726) Temp Source: Oral (07/25 0726) BP: 120/85 (07/25 0908) Pulse Rate: 101 (07/25 0908)  Labs:  Recent Labs  09/09/16 0444 09/09/16 1100 09/10/16 0609 09/10/16 0845 09/11/16 0742  HGB 9.2*  --  9.9*  --   --   HCT 28.0*  --  30.5*  --   --   PLT 172  --  199  --   --   LABPROT  --  15.9* 15.6*  --  16.3*  INR  --  1.26 1.23  --  1.30  CREATININE 2.60*  --   --  3.21*  --   TROPONINI 16.97*  --  10.04*  --   --     Estimated Creatinine Clearance: 23.5 mL/min (A) (by C-G formula based on SCr of 3.21 mg/dL (H)).     Assessment: 66 yo male with a history of CVA and afib on Eliquis PTA. Patient was admitted 7/18 with a life-threatening GI bleed. Eliquis was held at this time. Patient is ESRD and is no longer a candidate for DOACs. 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 GI cleared patient for aspirin and potential other anticoagulation with risk/benefit analysis as of 7/22. H/H at this time is low but stable and platelets are trending up.   INR today is 1.30 after 2 doses of 2.5 mg. No signs of bleeding have been noted.   Goal of Therapy:  INR 2-3 Monitor platelets by anticoagulation protocol: Yes   Plan:  Warfarin 4 mg X 1 tonight  Daily INR Monitor closely for signs/symptoms of bleeding and dose conservatively   Susa Raring , PharmD PGY2 Infectious Diseases Pharmacy Resident Pager: [phone]  09/11/2016,10:12 AM

## 2016-09-11 NOTE — Plan of Care (Signed)
Problem: Education: Goal: Knowledge of  General Education information/materials will improve Outcome: Progressing Confused, participating minimally in education overnight, needs reinforcement   Problem: Safety: Goal: Ability to remain free from injury will improve Outcome: Progressing Low bed in use, yellow socks and arm band, bed alarm, etc.  Problem: Physical Regulation: Goal: Ability to maintain clinical measurements within normal limits will improve Outcome: Progressing VSS  Problem: Skin Integrity: Goal: Risk for impaired skin integrity will decrease Outcome: Progressing Turns self in bed  Problem: Activity: Goal: Risk for activity intolerance will decrease Outcome: Progressing Up to chair today  Problem: Fluid Volume: Goal: Ability to maintain a balanced intake and output will improve Outcome: Progressing HD, fluid restriction

## 2016-09-11 NOTE — Progress Notes (Signed)
[place] TEAM 1 - Stepdown/ICU TEAM  Barney Russomanno  UJW:[id number] DOB: 1950-05-02 DOA: 09/04/2016 PCP: Caprice Renshaw, MD    Brief Narrative:  [name] w/ hx of chronic Afib on DOAC, chronic systolic CHF (EF 56%), prior CVA, vascular dementia, HTN, DM2, PVD, prior Left BKA, and chronic LE wounds who resides at a SNF. He presented from [facility] 7/18 after having 2 large volume maroon colored stools and BP in the 70s. On arrival to ED he was awake. His initial Hgb was 6.3. He was given K centra and 2 units of blood in the ED but remained hypotensive. Because of this PCCM asked to admit.   Significant Events: 7/18 Admit - R femoral CVL 7/19 R HD cath inserted - CVVH initiated until stabilized and able to transfer to [facility] for HD 7/21 colonoscopy > no blood seen 7/22 transferred to [facility] 7/23 [facility] assumed care   Subjective: The patient is resting comfortably in bed.  He is quite pleasant and interactive.  He is tolerating his breakfast without any difficulty.  He tells me he feels like "his eyesight is getting better" but observing him and does not appear that his visual acuity is high.  He didn't admit that he sees mostly outlines and shadows only.  He denies chest pain nausea vomiting or abdominal pain.  Assessment & Plan:  LGIB - likely diverticular Severe pan-diverticulosis on colo - as per GI "NG lavage negative - CT angio negative on admission - follow up tagged RBC scan suggested bleeding from rectosigmoid colon - unprepped unsedated lower endoscopy... pancolonic diverticulosis noted and brown stools" - appears to have stabilized for now - no further bleeding observed   Acute blood loss anemia - hemorrhagic shock  Shock resolved - Hgb stable - follow trend   Recent Labs Lab 09/07/16 0400 09/07/16 1600 09/08/16 0929 09/09/16 0444 09/10/16 0609  HGB 7.2* 9.6* 9.5* 9.2* 9.9*    Acute B blindness - B subacute occipital CVAs Stroke Team is following and directing care/evaluation of this  issue - pt will need SNF for extended rehab stay and assistance - I have discussed this w/ him and he is in agreement w/ placement   NSTEMI due to bleeding  Trop peaked at ~17 - not a candidate for cardiac cath per Cardiology - asymptomatic   ESRD Nephrology attending to HD   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 - viral hepatitis studies negative - INR within normal range (prior to warfarin) - bilirubin only mildly elevated - albumin modestly depressed - will need monitoring in outpatient setting  Chronic systolic CHF Volume management per HD - no signif volume overload on exam at this time  Filed Weights   09/10/16 0711 09/10/16 1140 09/11/16 0500  Weight: 76.2 kg (167 lb 15.9 oz) 72.1 kg (159 lb) 73.5 kg (162 lb)    Chronic Afib Previously on Eliquis - s/p KCentra - coumadin now being started given acute CVA in setting of Afib   DM2 CBG reasonably controlled - follow without change in treatment today  Severe PVD  Vascular dementia Agrees to SNF placement  DVT prophylaxis: SCDs Code Status: FULL CODE Family Communication: no family present at time of exam  Disposition Plan: Appears to be clinically stabilizing - transfer to renal telemetry bed - continue PT/OT - begin search for SNF placement  Consultants:  Pulmonary Neurology  [facility]   Antimicrobials:  Rocephin 7/21 > 7/23  Objective: Blood pressure 120/85, pulse (!) 101, temperature  98.8 F (37.1 C), temperature source Oral, resp. rate 20, height 6' (1.829 m), weight 73.5 kg (162 lb), SpO2 95 %.  Intake/Output Summary (Last 24 hours) at 09/11/16 0910 Last data filed at 09/10/16 2000  Gross per 24 hour  Intake              480 ml  Output             3175 ml  Net            -2695 ml   Filed Weights   09/10/16 0711 09/10/16 1140 09/11/16 0500  Weight: 76.2 kg (167 lb 15.9 oz) 72.1 kg (159 lb) 73.5 kg (162 lb)    Examination: General: No acute respiratory distress -  alert and pleasant  Lungs: CTA B w/o wheeze  Cardiovascular: regular rate - no M or rub  Abdomen: NT/ND, soft, bs+, no mass  Extremities: No significant C/C/E   CBC:  Recent Labs Lab 09/06/16 1600 09/06/16 2050 09/07/16 0400 09/07/16 1600 09/08/16 0929 09/09/16 0444 09/10/16 0609  WBC 14.2* 14.5* 13.0*  --  13.2* 11.8* 13.3*  NEUTROABS 12.2* 12.6* 11.0*  --  11.0*  --   --   HGB 8.1* 7.7* 7.2* 9.6* 9.5* 9.2* 9.9*  HCT 23.6* 21.8* 21.2* 28.6* 28.0* 28.0* 30.5*  MCV 84.6 84.8 86.5  --  86.7 86.2 87.4  PLT 125* 117* 118*  --  158 172 161   Basic Metabolic Panel:  Recent Labs Lab 09/04/16 1127 09/05/16 0304 09/05/16 1523 09/05/16 1642 09/06/16 0330 09/07/16 0405 09/08/16 0929 09/09/16 0444 09/10/16 0845  NA 139 139 140  138  --  139 137 134* 133* 133*  K 4.1 5.6* 4.5  4.4  --  4.4 4.4 4.4 3.6 3.9  CL 109 111 111  108  --  106 105 103 100* 99*  CO2 20* 21* 20*  20*  --  25 25 23 24 25   GLUCOSE 183* 130* 117*  115*  --  109* 88 87 83 82  BUN 28* 35* 35*  35*  --  25* 25* 27* 13 16  CREATININE 2.96* 3.43* 3.65*  3.71*  --  2.57* 2.88* 3.40* 2.60* 3.21*  CALCIUM 7.2* 6.3* 7.9*  7.9*  --  8.1* 8.1* 8.3* 8.1* 8.0*  MG 1.5* 1.2*  --  1.6* 1.9  --  2.0  --  1.8  PHOS 3.0 3.2 3.4  --  3.2  --  3.2  --   --    GFR: Estimated Creatinine Clearance: 23.5 mL/min (A) (by C-G formula based on SCr of 3.21 mg/dL (H)).  Liver Function Tests:  Recent Labs Lab 09/05/16 0430 09/05/16 1523 09/08/16 0929  AST  --   --  38  ALT  --   --  18  ALKPHOS  --   --  55  BILITOT  --   --  1.5*  PROT  --   --  5.6*  ALBUMIN 2.6* 2.9* 2.9*    Coagulation Profile:  Recent Labs Lab 09/04/16 1428 09/05/16 0304 09/09/16 1100 09/10/16 0609 09/11/16 0742  INR 1.03 1.33 1.26 1.23 1.30    Cardiac Enzymes:  Recent Labs Lab 09/05/16 2252 09/06/16 0330 09/08/16 0929 09/09/16 0444 09/10/16 0609  TROPONINI 9.70* 11.80* 16.69* 16.97* 10.04*    CBG:  Recent Labs Lab  09/10/16 2045 09/10/16 2145 09/10/16 2340 09/11/16 0303 09/11/16 0725  GLUCAP 326* 145* 233* 141* 117*    Recent Results (from the past 240 hour(s))  Culture, blood (Routine x 2)     Status: None   Collection Time: 09/04/16  2:12 AM  Result Value Ref Range Status   Specimen Description BLOOD BLOOD RIGHT FOREARM  Final   Special Requests IN PEDIATRIC BOTTLE Blood Culture adequate volume  Final   Culture   Final    NO GROWTH 5 DAYS Performed at [facility], [place] [address], [place], [place] [address]    Report Status 09/10/2016 FINAL  Final  Culture, blood (Routine x 2)     Status: None   Collection Time: 09/04/16  6:15 AM  Result Value Ref Range Status   Specimen Description BLOOD RIGHT HAND  Final   Special Requests   Final    BOTTLES DRAWN AEROBIC AND ANAEROBIC Blood Culture adequate volume   Culture   Final    NO GROWTH 5 DAYS Performed at [facility], [place] [address], [place], [place] [address]    Report Status 09/09/2016 FINAL  Final  MRSA PCR Screening     Status: None   Collection Time: 09/04/16  9:27 AM  Result Value Ref Range Status   MRSA by PCR NEGATIVE NEGATIVE Final    Comment:        The GeneXpert MRSA Assay (FDA approved for NASAL specimens only), is one component of a comprehensive MRSA colonization surveillance program. It is not intended to diagnose MRSA infection nor to guide or monitor treatment for MRSA infections.      Scheduled Meds: . atorvastatin  80 mg Oral q1800  . insulin aspart  0-5 Units Subcutaneous QHS  . insulin aspart  0-9 Units Subcutaneous TID WC  . isosorbide mononitrate  30 mg Oral Daily  . metoprolol tartrate  25 mg Oral BID  . pantoprazole  40 mg Oral Q1200  . patient's guide to using coumadin book   Does not apply Once  . warfarin   Does not apply Once  . Warfarin - Pharmacist Dosing Inpatient   Does not apply q1800     LOS: 7 days   Cherene Altes, MD [facility] Office  [phone] Pager  - Text Page per Amion as per below:  On-Call/Text Page:      [email or web address]      password [email or web address]  If 7PM-7AM, please contact night-coverage [email or web address] Password [email or web address] 09/11/2016, 9:10 AM

## 2016-09-12 DIAGNOSIS — N186 End stage renal disease: Secondary | ICD-10-CM

## 2016-09-12 DIAGNOSIS — J9690 Respiratory failure, unspecified, unspecified whether with hypoxia or hypercapnia: Secondary | ICD-10-CM

## 2016-09-12 DIAGNOSIS — I639 Cerebral infarction, unspecified: Secondary | ICD-10-CM

## 2016-09-12 DIAGNOSIS — I4891 Unspecified atrial fibrillation: Secondary | ICD-10-CM

## 2016-09-12 LAB — COMPREHENSIVE METABOLIC PANEL
ALBUMIN: 2.4 g/dL — AB (ref 3.5–5.0)
ALK PHOS: 63 U/L (ref 38–126)
ALT: 13 U/L — AB (ref 17–63)
ANION GAP: 9 (ref 5–15)
AST: 18 U/L (ref 15–41)
BILIRUBIN TOTAL: 0.6 mg/dL (ref 0.3–1.2)
BUN: 16 mg/dL (ref 6–20)
CALCIUM: 7.5 mg/dL — AB (ref 8.9–10.3)
CO2: 24 mmol/L (ref 22–32)
CREATININE: 3.15 mg/dL — AB (ref 0.61–1.24)
Chloride: 100 mmol/L — ABNORMAL LOW (ref 101–111)
GFR calc Af Amer: 22 mL/min — ABNORMAL LOW (ref >60)
GFR calc non Af Amer: 19 mL/min — ABNORMAL LOW (ref >60)
GLUCOSE: 123 mg/dL — AB (ref 65–99)
Potassium: 3.4 mmol/L — ABNORMAL LOW (ref 3.5–5.1)
Sodium: 133 mmol/L — ABNORMAL LOW (ref 135–145)
TOTAL PROTEIN: 5.4 g/dL — AB (ref 6.5–8.1)

## 2016-09-12 LAB — CBC
HCT: 25.7 % — ABNORMAL LOW (ref 39.0–52.0)
Hemoglobin: 8.6 g/dL — ABNORMAL LOW (ref 13.0–17.0)
MCH: 28.9 pg (ref 26.0–34.0)
MCHC: 33.5 g/dL (ref 30.0–36.0)
MCV: 86.2 fL (ref 78.0–100.0)
PLATELETS: 216 10*3/uL (ref 150–400)
RBC: 2.98 MIL/uL — ABNORMAL LOW (ref 4.22–5.81)
RDW: 15 % (ref 11.5–15.5)
WBC: 11.9 10*3/uL — ABNORMAL HIGH (ref 4.0–10.5)

## 2016-09-12 LAB — GLUCOSE, CAPILLARY
GLUCOSE-CAPILLARY: 138 mg/dL — AB (ref 65–99)
GLUCOSE-CAPILLARY: 124 mg/dL — AB (ref 65–99)
Glucose-Capillary: 135 mg/dL — ABNORMAL HIGH (ref 65–99)
Glucose-Capillary: 162 mg/dL — ABNORMAL HIGH (ref 65–99)

## 2016-09-12 LAB — PROTIME-INR
INR: 1.33
Prothrombin Time: 16.6 seconds — ABNORMAL HIGH (ref 11.4–15.2)

## 2016-09-12 MED ORDER — NEPRO/CARBSTEADY PO LIQD
237.0000 mL | Freq: Two times a day (BID) | ORAL | Status: DC
  Administered 2016-09-13 (×2): 237 mL via ORAL

## 2016-09-12 MED ORDER — HEPARIN SODIUM (PORCINE) 5000 UNIT/ML IJ SOLN: 5000.0000 [IU] | Freq: Three times a day (TID) | INTRAMUSCULAR | Status: DC

## 2016-09-12 MED ORDER — WARFARIN SODIUM 5 MG PO TABS
5.0000 mg | ORAL_TABLET | Freq: Once | ORAL | Status: AC
  Administered 2016-09-12: 5 mg via ORAL
  Filled 2016-09-12: qty 1

## 2016-09-12 MED ORDER — HEPARIN SODIUM (PORCINE) 5000 UNIT/ML IJ SOLN
5000.0000 [IU] | Freq: Two times a day (BID) | INTRAMUSCULAR | Status: DC
  Administered 2016-09-13: 5000 [IU] via SUBCUTANEOUS
  Filled 2016-09-12 (×2): qty 1

## 2016-09-12 NOTE — Progress Notes (Signed)
Physical Therapy Treatment Patient Details Name: George Burgess MRN: [id number] DOB: 1950-10-24 Today's Date: 09/12/2016    History of Present Illness Patient is a 66 y/o male who presents from SNF due to having 2 large volume maroon colored stools and BP in the 70s. Pt's Hgb was 6.3; was given K centra and 2 units of blood in the ED but remained hypotensive. Admitted for hemorhaggic shock, NSTEMI and acute anemia. MRI- Acute bilateral cerebrum and cerebellar infarctions, with the largest in the parieto-occipital regions bilaterally. PMH includes chronic Afib, chronic systolic CHF (EF 01%), prior CVA, vascular dementia, HTN, DM2, PVD, prior Left BKA, and chronic LE wounds     PT Comments    Despite talking with pt in advance about assisting him in standing to the chair, he actively was resistant due to his fear of being moved.  Was finally able to allow PT to pivot him, requiring significant help to power up and to pivot to chair on RLE.  Assisted with positioning and comfort, pt centered and had a good understanding of the purpose of being up.  Has significant cognitive impairment and so applied chair alarm and had dietician in the room when PT left.  Will follow acutely as needed for his mobility training.  SNF return is still very appropriate.  Follow Up Recommendations  SNF;Supervision for mobility/OOB     Equipment Recommendations  None recommended by PT    Recommendations for Other Services       Precautions / Restrictions Precautions Precautions: Fall Precaution Comments: vision loss Restrictions Weight Bearing Restrictions: No    Mobility  Bed Mobility Overal bed mobility: Needs Assistance Bed Mobility: Supine to Sit     Supine to sit: Min assist     General bed mobility comments: contact to cue direction   Transfers Overall transfer level: Needs assistance Equipment used: 1 person hand held assist Transfers: Stand Pivot Transfers;Lateral/Scoot Transfers   Stand  pivot transfers: Mod assist      Lateral/Scoot Transfers: Mod assist General transfer comment: pt was able to transition with cues but is inconsistent with effort, min to mod assist to stand pivot mainly due to fear of PT assisting him   Ambulation/Gait                 Stairs            Wheelchair Mobility    Modified Rankin (Stroke Patients Only)       Balance Overall balance assessment: Needs assistance Sitting-balance support: Feet supported Sitting balance-Leahy Scale: Fair     Standing balance support: Bilateral upper extremity supported;Single extremity supported Standing balance-Leahy Scale: Poor                              Cognition Arousal/Alertness: Awake/alert Behavior During Therapy: Anxious Overall Cognitive Status: History of cognitive impairments - at baseline                                 General Comments: Pt with h/o dementia.  Now with new visual deficits       Exercises      General Comments        Pertinent Vitals/Pain Pain Assessment: No/denies pain    Home Living                      Prior Function  PT Goals (current goals can now be found in the care plan section) Acute Rehab PT Goals Patient Stated Goal: to get home ASAP Progress towards PT goals: Progressing toward goals    Frequency    Min 2X/week      PT Plan Current plan remains appropriate    Co-evaluation              AM-PAC PT "6 Clicks" Daily Activity  Outcome Measure  Difficulty turning over in bed (including adjusting bedclothes, sheets and blankets)?: A Little Difficulty moving from lying on back to sitting on the side of the bed? : Total Difficulty sitting down on and standing up from a chair with arms (e.g., wheelchair, bedside commode, etc,.)?: Total Help needed moving to and from a bed to chair (including a wheelchair)?: A Lot Help needed walking in hospital room?: Total Help needed  climbing 3-5 steps with a railing? : Total 6 Click Score: 9    End of Session Equipment Utilized During Treatment: Gait belt Activity Tolerance: Patient tolerated treatment well;Patient limited by fatigue;Other (comment) (vision change plus stroke and BK amputation were a struggle ) Patient left: in chair;with call bell/phone within reach;with chair alarm set Nurse Communication: Mobility status PT Visit Diagnosis: Muscle weakness (generalized) (M62.81);Unsteadiness on feet (R26.81);Difficulty in walking, not elsewhere classified (R26.2)     Time: 1055-1110 PT Time Calculation (min) (ACUTE ONLY): 15 min  Charges:  $Therapeutic Activity: 8-22 mins                    G Codes:  Functional Assessment Tool Used: AM-PAC 6 Clicks Basic Mobility     Ramond Dial 09/12/2016, 12:09 PM   Mee Hives, PT MS Acute Rehab Dept. Number: [place]

## 2016-09-12 NOTE — Progress Notes (Addendum)
PROGRESS NOTE    George Burgess  IPJ:[id number] DOB: Jun 21, 1950 DOA: 09/04/2016 PCP: Caprice Renshaw, MD   Chief Complaint  Patient presents with  . GI Bleeding    Brief Narrative:  HPI On 09/04/16 by Mr. Salvadore Dom, NP This is a 66 year old male w/ sig h/o: CAF (on DOAC), HFrEF (EF 30%), prior CVA, vascular dementia, HTN, diabetes type II,  Bilateral Pleural effusions, PVD, prior Left BKA, chronic LE wounds. Resides at SNF (has no HCPOA). Presented from SNF 6/18 after having 2 large Volume maroon colored stools and BP in 70s. On arrival to ED he was awake. Remained hypotensive w/ SBP in 70s but would increase w/ fluid and blood resuscitation. His initial Hgb was 6.3. He was given K centra, received 2 units of blood In ED but remained hypotensive. Because of this PCCM asked to admit.  Assessment & Plan   LGIB -Patient presented with a hemoglobin of 6.3, was noted to be hypotensive  -Gastroenterology was consulted and appreciated -CTA negative on admission -Tagged RBC scan showed bleeding from the rectosigmoid colon -Colonoscopy showed severe pandiverticulosis  Acute blood loss anemia/hemorrhagic shock -Resolved, hemoglobin currently stable  Acute/Subacute Bilateral Parietal/Occiptal CVA with acute bilateral blindness -CT head showed subacute infarctions in both parieto-occipital regions, L>R -MRI brain showed acute bilateral cerebrum and cerebellar infarctions, largest in the parieto-occipital regions bilaterally -Echocardiogram showed an EF of 40-45%, no source of embolus -LDL 44, hemoglobin A1c 5.1 -Neurology consulted and appreciated, has now signed off. Recommended patient be on Coumadin instead of Eliquis given GIB and ESRD -Coumadin was started on 7/23 without bridging by the neurology service -PT/OT consulted and recommended SNF -discussed with pharmacy, given AFib and CVA, and first time on coumadin, will place patient on heparin SQ BID for DVT prophylaxis    NSTEMI -Troponin peaked at 46 -Cardiology consulted and appreciated, though patient not candidate for cardiac catheterization -Patient currently asymptomatic -continue imdur, statin, metoprolol  End-stage renal disease -On dialysis Tuesday, Thursday, Saturday -Nephrology consulted and appreciated  Liver cirrhosis -Acute hepatitis panel negative -Noted on imaging  Chronic systolic heart failure -Volume management with dialysis  Diabetes mellitus, type II -Hemoglobin A1c 5.1 -Continue insulin sliding scale CBG monitoring  Bilateral pleural effusions -Volume management with dialysis -Currently patient asymptomatic  Chronic atrial fibrillation -Patient was on Eliquis previously and given KCentra -Now on coumadin given acute CVA in the setting of AFIB -INR subtherapeutic -Continue metoprolol for rate control  Peripheral vascular disease, severe -Outpatient workup  Vascular dementia -Appears stable  DVT Prophylaxis  SCDs, Heparin, Coumadin   Code Status: Full  Family Communication: None at bedside  Disposition Plan: Admitted. SNF when medically stable  Consultants Gastroenterology [facility] Cardiology Nephrology Neurology Cardiology   Procedures/Significant Events Echocardiogram Colonoscopy Tagged RBC scan Right HD catheter insertion  Antibiotics   Anti-infectives    Start     Dose/Rate Route Frequency Ordered Stop   09/07/16 0900  cefTRIAXone (ROCEPHIN) 1 g in dextrose 5 % 50 mL IVPB  Status:  Discontinued     1 g 100 mL/hr over 30 Minutes Intravenous Daily 09/07/16 0742 09/09/16 1802      Subjective:   George Burgess seen and examined today.  Patient has no complaints today. Currently denies any chest pain, shortness breath, abdominal pain, nausea or vomiting, diarrhea or constipation, dizziness or headache.  Objective:   Vitals:   09/11/16 2124 09/11/16 2337 09/12/16 0500 09/12/16 0850  BP: 117/70 133/81 116/72 133/84  Pulse: (!) 101 100 (!)  101 (!) 102  Resp:  20 (!) 21 20  Temp:  98.4 F (36.9 C) 98.4 F (36.9 C) 98.4 F (36.9 C)  TempSrc:  Oral Oral Oral  SpO2:  100% 100% 100%  Weight:  78.9 kg (174 lb)    Height:  6' (1.829 m)      Intake/Output Summary (Last 24 hours) at 09/12/16 1404 Last data filed at 09/12/16 1000  Gross per 24 hour  Intake              120 ml  Output              950 ml  Net             -830 ml   Filed Weights   09/10/16 1140 09/11/16 0500 09/11/16 2337  Weight: 72.1 kg (159 lb) 73.5 kg (162 lb) 78.9 kg (174 lb)    Exam  General: Well developed, elderly, ill-appearing, NAD  HEENT: NCAT, mucous membranes moist.   Cardiovascular: S1 S2 auscultated, tachycardic, +SEM  Respiratory: Diminished but clear, no wheezing  Abdomen: Soft, nontender, nondistended, + bowel sounds  Extremities: Left BKA, RLE wound with dressing in place- no edema.  Neuro: AAOx3, ?blindness, patient able to track me, sluggish pupils, otherwise nonfocal  Psych: Appropriate    Data Reviewed: I have personally reviewed following labs and imaging studies  CBC:  Recent Labs Lab 09/06/16 1600 09/06/16 2050 09/07/16 0400 09/07/16 1600 09/08/16 0929 09/09/16 0444 09/10/16 0609 09/12/16 0503  WBC 14.2* 14.5* 13.0*  --  13.2* 11.8* 13.3* 11.9*  NEUTROABS 12.2* 12.6* 11.0*  --  11.0*  --   --   --   HGB 8.1* 7.7* 7.2* 9.6* 9.5* 9.2* 9.9* 8.6*  HCT 23.6* 21.8* 21.2* 28.6* 28.0* 28.0* 30.5* 25.7*  MCV 84.6 84.8 86.5  --  86.7 86.2 87.4 86.2  PLT 125* 117* 118*  --  158 172 199 709   Basic Metabolic Panel:  Recent Labs Lab 09/05/16 1523 09/05/16 1642 09/06/16 0330 09/07/16 0405 09/08/16 0929 09/09/16 0444 09/10/16 0845 09/12/16 0503  NA 140  138  --  139 137 134* 133* 133* 133*  K 4.5  4.4  --  4.4 4.4 4.4 3.6 3.9 3.4*  CL 111  108  --  106 105 103 100* 99* 100*  CO2 20*  20*  --  25 25 23 24 25 24   GLUCOSE 117*  115*  --  109* 88 87 83 82 123*  BUN 35*  35*  --  25* 25* 27* 13 16 16    CREATININE 3.65*  3.71*  --  2.57* 2.88* 3.40* 2.60* 3.21* 3.15*  CALCIUM 7.9*  7.9*  --  8.1* 8.1* 8.3* 8.1* 8.0* 7.5*  MG  --  1.6* 1.9  --  2.0  --  1.8  --   PHOS 3.4  --  3.2  --  3.2  --   --   --    GFR: Estimated Creatinine Clearance: 25.3 mL/min (A) (by C-G formula based on SCr of 3.15 mg/dL (H)). Liver Function Tests:  Recent Labs Lab 09/05/16 1523 09/08/16 0929 09/12/16 0503  AST  --  38 18  ALT  --  18 13*  ALKPHOS  --  55 63  BILITOT  --  1.5* 0.6  PROT  --  5.6* 5.4*  ALBUMIN 2.9* 2.9* 2.4*   No results for input(s): LIPASE, AMYLASE in the last 168 hours. No results for input(s): AMMONIA in the last 168  hours. Coagulation Profile:  Recent Labs Lab 09/09/16 1100 09/10/16 0609 09/11/16 0742 09/12/16 0503  INR 1.26 1.23 1.30 1.33   Cardiac Enzymes:  Recent Labs Lab 09/05/16 2252 09/06/16 0330 09/08/16 0929 09/09/16 0444 09/10/16 0609  TROPONINI 9.70* 11.80* 16.69* 16.97* 10.04*   BNP (last 3 results) No results for input(s): PROBNP in the last 8760 hours. HbA1C:  Recent Labs  09/10/16 1919  HGBA1C 5.1   CBG:  Recent Labs Lab 09/11/16 1227 09/11/16 1651 09/11/16 2123 09/12/16 0812 09/12/16 1144  GLUCAP 136* 108* 154* 138* 124*   Lipid Profile: No results for input(s): CHOL, HDL, LDLCALC, TRIG, CHOLHDL, LDLDIRECT in the last 72 hours. Thyroid Function Tests: No results for input(s): TSH, T4TOTAL, FREET4, T3FREE, THYROIDAB in the last 72 hours. Anemia Panel: No results for input(s): VITAMINB12, FOLATE, FERRITIN, TIBC, IRON, RETICCTPCT in the last 72 hours. Urine analysis:    Component Value Date/Time   COLORURINE YELLOW 09/09/2016 0045   APPEARANCEUR CLOUDY (A) 09/09/2016 0045   LABSPEC 1.017 09/09/2016 0045   PHURINE 5.0 09/09/2016 0045   GLUCOSEU NEGATIVE 09/09/2016 0045   HGBUR NEGATIVE 09/09/2016 0045   BILIRUBINUR NEGATIVE 09/09/2016 0045   KETONESUR NEGATIVE 09/09/2016 0045   PROTEINUR 100 (A) 09/09/2016 0045    NITRITE NEGATIVE 09/09/2016 0045   LEUKOCYTESUR TRACE (A) 09/09/2016 0045   Sepsis Labs: @LABRCNTIP (procalcitonin:4,lacticidven:4)  ) Recent Results (from the past 240 hour(s))  Culture, blood (Routine x 2)     Status: None   Collection Time: 09/04/16  2:12 AM  Result Value Ref Range Status   Specimen Description BLOOD BLOOD RIGHT FOREARM  Final   Special Requests IN PEDIATRIC BOTTLE Blood Culture adequate volume  Final   Culture   Final    NO GROWTH 5 DAYS Performed at [facility], [place] [address], [place], [place] [address]    Report Status 09/10/2016 FINAL  Final  Culture, blood (Routine x 2)     Status: None   Collection Time: 09/04/16  6:15 AM  Result Value Ref Range Status   Specimen Description BLOOD RIGHT HAND  Final   Special Requests   Final    BOTTLES DRAWN AEROBIC AND ANAEROBIC Blood Culture adequate volume   Culture   Final    NO GROWTH 5 DAYS Performed at [facility], [place] [address] [place], [place] [address]    Report Status 09/09/2016 FINAL  Final  MRSA PCR Screening     Status: None   Collection Time: 09/04/16  9:27 AM  Result Value Ref Range Status   MRSA by PCR NEGATIVE NEGATIVE Final    Comment:        The GeneXpert MRSA Assay (FDA approved for NASAL specimens only), is one component of a comprehensive MRSA colonization surveillance program. It is not intended to diagnose MRSA infection nor to guide or monitor treatment for MRSA infections.       Radiology Studies: Dg Chest Port 1 View  Result Date: 09/11/2016 CLINICAL DATA:  Follow-up examination for pulmonary edema. EXAM: PORTABLE CHEST 1 VIEW COMPARISON:  Prior radiograph from 09/07/2016. FINDINGS: Right-sided dialysis catheter noted, stable. Stable cardiomegaly. Mediastinal silhouette unchanged. Lungs normally inflated. Previously seen pulmonary edema is improved relative to previous exam, with persistent mild perihilar vascular congestion. Small bilateral pleural effusions  remain, also likely slightly improved. Associated bibasilar opacities likely atelectasis. No new focal infiltrates. No pneumothorax. Osseous structures within normal limits. IMPRESSION: 1. Cardiomegaly with improved pulmonary edema as compared to 7/20 1/18. Persistent mild  perihilar vascular congestion. 2. Persistent bilateral pleural effusions with associated atelectasis. Electronically Signed   By: Jeannine Boga M.D.   On: 09/11/2016 13:00     Scheduled Meds: . atorvastatin  80 mg Oral q1800  . feeding supplement (NEPRO CARB STEADY)  237 mL Oral BID BM  . heparin subcutaneous  5,000 Units Subcutaneous Q8H  . insulin aspart  0-5 Units Subcutaneous QHS  . insulin aspart  0-9 Units Subcutaneous TID WC  . isosorbide mononitrate  30 mg Oral Daily  . metoprolol tartrate  25 mg Oral BID  . pantoprazole  40 mg Oral Q1200  . warfarin   Does not apply Once  . Warfarin - Pharmacist Dosing Inpatient   Does not apply q1800   Continuous Infusions: . sodium chloride    . sodium chloride       LOS: 8 days   Time Spent in minutes   30 minutes  Joffrey Kerce D.O. on 09/12/2016 at 2:04 PM  Between 7am to 7pm - Pager - [phone]  After 7pm go to [email or web address] - password [email or web address]  And look for the night coverage person covering for me after hours  [facility] Office  [phone]

## 2016-09-12 NOTE — Progress Notes (Signed)
ANTICOAGULATION CONSULT NOTE - Follow-Up Consult  Pharmacy Consult for warfarin Indication: atrial fibrillation  No Known Allergies  Vital Signs: Temp: 98.4 F (36.9 C) (07/26 0850) Temp Source: Oral (07/26 0850) BP: 133/84 (07/26 0850) Pulse Rate: 102 (07/26 0850)   Labs:  Recent Labs  09/10/16 0609 09/10/16 0845 09/11/16 0742 09/12/16 0503  HGB 9.9*  --   --  8.6*  HCT 30.5*  --   --  25.7*  PLT 199  --   --  216  LABPROT 15.6*  --  16.3* 16.6*  INR 1.23  --  1.30 1.33  CREATININE  --  3.21*  --  3.15*  TROPONINI 10.04*  --   --   --     Estimated Creatinine Clearance: 25.3 mL/min (A) (by C-G formula based on SCr of 3.15 mg/dL (H)).   Assessment: 66 yo male with a history of CVA and afib on Eliquis PTA. Patient was admitted 7/18 with a life-threatening GI bleed. Eliquis was held at this time. Patient is ESRD and is no longer a candidate for DOACs. 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 GI cleared patient for aspirin and potential other anticoagulation with risk/benefit analysis as of 7/22. H/H at this time is low but stable and platelets are trending up.   INR trend:  1.26 (2.5mg )>>1.23 (2.5mg )>>1.30 (4mg )>>1.33 Still subtherapeutic and will likely require higher warfarin dosing, still going conservatively due to recent bleed.  Since Mr. Ingalls was recently started on warfarin, has a recent history of stroke, but also the major GI bleed, we decided to bridge the warfarin with low dose heparin. Full anticoagulation with heparin was avoided due to the higher risk of bleeding. MD approves and is aware.  Goal of Therapy:  INR 2.0-2.5 Monitor platelets by anticoagulation protocol: Yes   Plan:  Warfarin 5 mg X 1 tonight  Daily INR Monitor closely for signs/symptoms of bleeding and dose conservatively  Start SQ Heparin 5000 units twice daily   Patterson Hammersmith PharmD PGY1 Pharmacy Practice Resident 09/12/2016  2:21 PM Pager: [phone]

## 2016-09-12 NOTE — Progress Notes (Signed)
Nutrition Follow-up  DOCUMENTATION CODES:   Non-severe (moderate) malnutrition in context of chronic illness  INTERVENTION:   -Nepro Shake po BID, each supplement provides 425 kcal and 19 grams protein  NUTRITION DIAGNOSIS:   Malnutrition (moderate/non-severe) related to chronic illness (ESRD on HD) as evidenced by moderate depletion of body fat, moderate depletions of muscle mass.  Being addressed as diet advanced, po >75%, adding supplements  GOAL:   Patient will meet greater than or equal to 90% of their needs  Progressing  MONITOR:   Weight trends, Labs, Skin, Other (Comment) (Nutrition-related plan)  REASON FOR ASSESSMENT:   Malnutrition Screening Tool    ASSESSMENT:   66 year old male w/ sig h/o: CAF (on DOAC), HFrEF (EF 30%), prior CVA, vascular dementia, HTN, diabetes type II,  Bilateral Pleural effusions, PVD, prior Left BKA, chronic LE wounds. Resides at SNF (has no HCPOA). Presented from SNF 6/18 after having 2 large Volume maroon colored stools and BP in 70s. On arrival to ED he was awake. Remained hypotensive w/ SBP in 70s but would increase w/ fluid and blood resuscitation. His initial Hgb was 6.3. He was given K centra, received 2 units of blood In ED but remained hypotensive. Because of this PCCM asked to admit.    Pt transitioned to IHD from CVVHD Working with PT on visit today 7/22 Pt developed cortical blindness  Recorded po intake >75% of meals on average. Pt reports appetite is good.   Labs: sodium 133, potassium 3.4, Creatinine 3.15, BUN wdl, CBGs 108-154 Meds: reviewed  Diet Order:  Diet renal/carb modified with fluid restriction Diet-HS Snack? Nothing; Room service appropriate? Yes; Fluid consistency: Thin; Fluid restriction: 2000 mL Fluid  Skin:  Wound (see comment) (Non-pressure wounds to R foot, R leg, L leg)  Last BM:  7/22  Height:   Ht Readings from Last 1 Encounters:  09/11/16 6' (1.829 m)    Weight:   Wt Readings from Last 1  Encounters:  09/11/16 174 lb (78.9 kg)    Ideal Body Weight:  80.91 kg  BMI:  Body mass index is 23.6 kg/m.  Estimated Nutritional Needs:   Kcal:  5883-2549 (30-33 kcal/kg)  Protein:  107-130 g  Fluid:  1000 mL plus UOP  EDUCATION NEEDS:   No education needs identified at this time  [place], [place], LDN [phone] Pager  [phone] Weekend/On-Call Pager

## 2016-09-12 NOTE — Progress Notes (Signed)
[facility] Progress Note  Subjective: no c/o's.    Vitals:   09/11/16 2124 09/11/16 2337 09/12/16 0500 09/12/16 0850  BP: 117/70 133/81 116/72 133/84  Pulse: (!) 101 100 (!) 101 (!) 102  Resp:  20 (!) 21 20  Temp:  98.4 F (36.9 C) 98.4 F (36.9 C) 98.4 F (36.9 C)  TempSrc:  Oral Oral Oral  SpO2:  100% 100% 100%  Weight:  78.9 kg (174 lb)    Height:  6' (1.829 m)      Inpatient medications: . atorvastatin  80 mg Oral q1800  . insulin aspart  0-5 Units Subcutaneous QHS  . insulin aspart  0-9 Units Subcutaneous TID WC  . isosorbide mononitrate  30 mg Oral Daily  . metoprolol tartrate  25 mg Oral BID  . pantoprazole  40 mg Oral Q1200  . warfarin   Does not apply Once  . Warfarin - Pharmacist Dosing Inpatient   Does not apply q1800   . sodium chloride    . sodium chloride     sodium chloride, sodium chloride, alteplase, heparin, lidocaine (PF), lidocaine-prilocaine, pentafluoroprop-tetrafluoroeth, sodium chloride flush   Physical Exam: GEN cachectic, NAD HEENT sclerae anicteric.  Did not make eye contact with me; per nursing can track just a little bit NECK no JVD PULM unlabored, diminished at bases CV tachycardic, no m/r/g ABD nontender GU: no more BRBPR EXT s/p L BKA, R leg with 2 ulcers, dressed on the dorsum of the foot and the R shin.  R foot is cool  NEURO sluggish pupillary reflex ACCESS: R IJ permcath  CXR 7/21 - CHF, CM  Dialysis orders: [facility] /  TTS 4h   3K/ 3Ca bath  EDW 159 lbs (= 72.5kg)  350/800  T IJ TDC  Hep 3800 (holding for now d/t GIB)  Impression: 1 Hemorrhagic shock 2/2 acute lower GIB: likely diverticular; colonoscopy w/ severe pan-diverticulosis Transfuse PRN.  Per PCCM and GI.  2  Acute bilat CVA's - cerebellar/ parieto-occipital, per neuro complication of afib. +cortical blindness.  Per neuro recommends switch Eliquis to coumadin given ESRD/ dialysis.  3  NSTEMI: likely demand from ABLA.  Not a candidate for cath  per cardiology c/s note 7/20. 4  Afib on Eliquis: received KCentra, got  FEIBA. No longer candidate for DOAC. On MTP.  5  ESRD: TTS HD.  Hold heparin on HD for now 6  HTN: BP's soft, on home MTP here. Home lisinopril on hold, resume prn.  7  Anemia of ESRD/ ABLA: getting blood products, will give iron and ESA as appropriate 8  Metabolic Bone Disease: will place on binders and VDRA as appropriate 9  Volume - at dry weight now  Plan - HD today, UF as tolerated.     Kelly Splinter MD [facility] pager [phone]   09/12/2016, 1:18 PM    Recent Labs Lab 09/05/16 1523 09/06/16 0330  09/08/16 0929 09/09/16 0444 09/10/16 0845 09/12/16 0503  NA 140  138 139  < > 134* 133* 133* 133*  K 4.5  4.4 4.4  < > 4.4 3.6 3.9 3.4*  CL 111  108 106  < > 103 100* 99* 100*  CO2 20*  20* 25  < > 23 24 25 24   GLUCOSE 117*  115* 109*  < > 87 83 82 123*  BUN 35*  35* 25*  < > 27* 13 16 16   CREATININE 3.65*  3.71* 2.57*  < > 3.40* 2.60*  3.21* 3.15*  CALCIUM 7.9*  7.9* 8.1*  < > 8.3* 8.1* 8.0* 7.5*  PHOS 3.4 3.2  --  3.2  --   --   --   < > = values in this interval not displayed.  Recent Labs Lab 09/05/16 1523 09/08/16 0929 09/12/16 0503  AST  --  38 18  ALT  --  18 13*  ALKPHOS  --  55 63  BILITOT  --  1.5* 0.6  PROT  --  5.6* 5.4*  ALBUMIN 2.9* 2.9* 2.4*    Recent Labs Lab 09/06/16 2050 09/07/16 0400  09/08/16 0929 09/09/16 0444 09/10/16 0609 09/12/16 0503  WBC 14.5* 13.0*  --  13.2* 11.8* 13.3* 11.9*  NEUTROABS 12.6* 11.0*  --  11.0*  --   --   --   HGB 7.7* 7.2*  < > 9.5* 9.2* 9.9* 8.6*  HCT 21.8* 21.2*  < > 28.0* 28.0* 30.5* 25.7*  MCV 84.8 86.5  --  86.7 86.2 87.4 86.2  PLT 117* 118*  --  158 172 199 216  < > = values in this interval not displayed. Iron/TIBC/Ferritin/ %Sat No results found for: IRON, TIBC, FERRITIN, IRONPCTSAT

## 2016-09-13 LAB — GLUCOSE, CAPILLARY
GLUCOSE-CAPILLARY: 122 mg/dL — AB (ref 65–99)
GLUCOSE-CAPILLARY: 164 mg/dL — AB (ref 65–99)
GLUCOSE-CAPILLARY: 208 mg/dL — AB (ref 65–99)
Glucose-Capillary: 136 mg/dL — ABNORMAL HIGH (ref 65–99)

## 2016-09-13 LAB — CBC
HCT: 27.8 % — ABNORMAL LOW (ref 39.0–52.0)
HEMOGLOBIN: 9.2 g/dL — AB (ref 13.0–17.0)
MCH: 28.6 pg (ref 26.0–34.0)
MCHC: 33.1 g/dL (ref 30.0–36.0)
MCV: 86.3 fL (ref 78.0–100.0)
PLATELETS: 219 10*3/uL (ref 150–400)
RBC: 3.22 MIL/uL — AB (ref 4.22–5.81)
RDW: 14.8 % (ref 11.5–15.5)
WBC: 12.5 10*3/uL — AB (ref 4.0–10.5)

## 2016-09-13 LAB — BASIC METABOLIC PANEL
ANION GAP: 8 (ref 5–15)
BUN: 14 mg/dL (ref 6–20)
CHLORIDE: 100 mmol/L — AB (ref 101–111)
CO2: 27 mmol/L (ref 22–32)
Calcium: 7.7 mg/dL — ABNORMAL LOW (ref 8.9–10.3)
Creatinine, Ser: 2.46 mg/dL — ABNORMAL HIGH (ref 0.61–1.24)
GFR, EST AFRICAN AMERICAN: 30 mL/min — AB (ref >60)
GFR, EST NON AFRICAN AMERICAN: 26 mL/min — AB (ref >60)
Glucose, Bld: 118 mg/dL — ABNORMAL HIGH (ref 65–99)
POTASSIUM: 3.2 mmol/L — AB (ref 3.5–5.1)
SODIUM: 135 mmol/L (ref 135–145)

## 2016-09-13 LAB — PROTIME-INR
INR: 1.21
PROTHROMBIN TIME: 15.4 s — AB (ref 11.4–15.2)

## 2016-09-13 MED ORDER — ATORVASTATIN CALCIUM 80 MG PO TABS: 80.0000 mg | ORAL_TABLET | Freq: Every day | ORAL | 0 refills | Status: DC

## 2016-09-13 MED ORDER — WARFARIN SODIUM 5 MG PO TABS: 5.0000 mg | ORAL_TABLET | Freq: Once | ORAL | 0 refills | Status: DC

## 2016-09-13 MED ORDER — POTASSIUM CHLORIDE CRYS ER 20 MEQ PO TBCR
20.0000 meq | EXTENDED_RELEASE_TABLET | Freq: Once | ORAL | Status: AC
  Administered 2016-09-13: 20 meq via ORAL
  Filled 2016-09-13: qty 1

## 2016-09-13 MED ORDER — DARBEPOETIN ALFA 60 MCG/0.3ML IJ SOSY: 60.0000 ug | PREFILLED_SYRINGE | INTRAMUSCULAR | Status: DC

## 2016-09-13 MED ORDER — WARFARIN SODIUM 5 MG PO TABS
5.0000 mg | ORAL_TABLET | Freq: Once | ORAL | Status: AC
  Administered 2016-09-13: 5 mg via ORAL
  Filled 2016-09-13: qty 1

## 2016-09-13 NOTE — Progress Notes (Signed)
Report called and given to RN at [facility]. Will continue to monitor.

## 2016-09-13 NOTE — Progress Notes (Signed)
Stopped by to visit w/ pt when he caught my eye when going to visit nearby pt. Provided spiritual/emotional support, ministry of presence, and (Christian) prayer - which pt appreciated. He did not seem clear about what happened next, and wanted to know. Advised pt he can ask nurse to call for chaplain at any time.   09/13/16 1600  Clinical Encounter Type  Visited With Patient  Visit Type Initial;Psychological support;Spiritual support;Social support  Referral From Chaplain  Spiritual Encounters  Spiritual Needs Prayer;Emotional  Stress Factors  Patient Stress Factors Health changes;Loss of control   Gerrit Heck, Chaplain

## 2016-09-13 NOTE — Progress Notes (Signed)
ANTICOAGULATION CONSULT NOTE - Follow-Up Consult  Pharmacy Consult for warfarin Indication: atrial fibrillation  No Known Allergies  Vital Signs: Temp: 98.2 F (36.8 C) (07/27 0911) Temp Source: Oral (07/27 0911) BP: 127/75 (07/27 0911) Pulse Rate: 103 (07/27 0911)   Labs:  Recent Labs  09/11/16 0742 09/12/16 0503 09/13/16 0421 09/13/16 0752  HGB  --  8.6*  --  9.2*  HCT  --  25.7*  --  27.8*  PLT  --  216  --  219  LABPROT 16.3* 16.6* 15.4*  --   INR 1.30 1.33 1.21  --   CREATININE  --  3.15*  --  2.46*    Estimated Creatinine Clearance: 32.4 mL/min (A) (by C-G formula based on SCr of 2.46 mg/dL (H)).   Assessment: 66 yo male with a history of CVA and afib on Eliquis PTA. Patient was admitted 7/18 with a life-threatening GI bleed. Eliquis was held at this time. Patient is ESRD and is no longer a candidate for DOACs. 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 GI cleared patient for aspirin and potential other anticoagulation with risk/benefit analysis as of 7/22.   INR trend:  1.26 (2.5mg )>>1.23 (2.5mg )>>1.30 (4mg )>>1.33 (5mg )>> 1.21  Dosing warfarin conservatively due to recent bleed.  Since Mr. Sanor was recently started on warfarin, has a recent history of stroke, but also the major GI bleed, we decided to bridge the warfarin with low dose heparin. Full anticoagulation with heparin was avoided due to the higher risk of bleeding. MD approves and is aware.  Goal of Therapy:  INR 2.0-2.5 Monitor platelets by anticoagulation protocol: Yes   Plan:  Warfarin 5 mg PO x1 tonight  Daily INR while here- social work is also checking to see if a daily INR can be done at [facility] Recommend warfarin 5mg  daily if discharged today- again, dosing conservatively given recent bleed Monitor closely for signs/symptoms of bleeding Heparin 5000 units subQ twice daily  Jia Mohamed D. D'Arcy Abraha, PharmD, BCPS Clinical Pharmacist Pager:  [phone] Clinical Phone for 09/13/2016 until 3:30pm: [phone] If after 3:30pm, please call main pharmacy at [phone] 09/13/2016 11:34 AM

## 2016-09-13 NOTE — Discharge Summary (Signed)
Physician Discharge Summary  George Burgess JME:[id number] DOB: 11-Nov-1950 DOA: 09/04/2016  PCP: Caprice Renshaw, MD  Admit date: 09/04/2016 Discharge date: 09/13/2016  Time spent: 45 minutes  Recommendations for Outpatient Follow-up:  Patient will be discharged to [facility].  Patient will need to follow up with primary care provider within one week of discharge. Continue to monitor INR daily until INR is between 2-2.5- results should be sent to the physician attending of the nursing facility.  Patient should continue medications as prescribed.  Patient should follow a renal/carb modified diet with 2000 ml fluid restriction per day. Continue hemodialysis as scheduled.  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 Salvadore Dom, NP This is a 66 year old male w/ sig h/o: CAF (on DOAC), HFrEF (EF 30%), prior CVA, vascular dementia, HTN, diabetes type II, Bilateral Pleural effusions, PVD, prior Left BKA, chronic LE wounds. Resides at SNF (has no HCPOA). Presented from SNF 6/18 after having 2 large Volume maroon colored stools and BP in 70s. On arrival to ED he was awake. Remained hypotensive w/ SBP in 70s but would increase w/ fluid and blood resuscitation. His initial Hgb was 6.3. He was given K centra, received 2 units of blood In ED but remained hypotensive. Because of this PCCM  asked to admit.   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 Recommended patient be on Coumadin instead of Eliquis given GIB and ESRD -Coumadin was started on 7/23 without bridging by the neurology service -PT/OT consulted and recommended SNF -discussed with pharmacy, given AFib and CVA, and first time on coumadin  NSTEMI -Troponin peaked at 104 -Cardiology consulted and appreciated, though patient not candidate for cardiac catheterization -Patient currently asymptomatic -continue imdur, statin, metoprolol  End-stage renal disease -On dialysis Tuesday, Thursday, Saturday -Nephrology consulted and appreciated  Liver cirrhosis -Acute hepatitis panel negative -Noted on imaging  Chronic systolic heart failure -Volume management with dialysis  Diabetes mellitus, type II -Hemoglobin A1c 5.1 -Continue home insulin on discharge  Bilateral pleural effusions -Volume management with dialysis -Currently patient asymptomatic  Chronic atrial fibrillation -Patient was on Eliquis previously and given KCentra -Now on coumadin given acute CVA in the setting of AFIB -INR subtherapeutic, currently 1.21 -Continue  metoprolol for rate control -Patient will need daily INR checks until his INR is therapeutic between 2 and 2.5. -Continue Coumadin 5 mg nightly  until therapeutic INR is reached.  Peripheral vascular disease, severe -Outpatient workup  Vascular dementia -Appears stable  Consultants Gastroenterology [facility] Nephrology Neurology Cardiology   Procedures/Significant Events Echocardiogram Colonoscopy Tagged RBC scan Right HD catheter insertion  Discharge Exam: Vitals:   09/13/16 0554 09/13/16 0911  BP: 123/73 127/75  Pulse: (!) 102 (!) 103  Resp: 16 17  Temp: 98.5 F (36.9 C) 98.2 F (36.8 C)   Patient currently has no complaints. Denies chest pain, shortness of breath, abdominal pain, N/V/D/C.   General: Well developed, elderly, ill-appearing, NAD  HEENT: NCAT, mucous membranes moist.  Cardiovascular: S1 S2 auscultated, +SEM, tachycardic  Respiratory: Diminished breath sounds  Abdomen: Soft, nontender, nondistended, + bowel sounds  Extremities: Left BKA. RLE wound with dressing in place, no edema  Neuro: AAOx1 (self). ?Blindness. Patient appears to be able to track, but states he feels her pupils are blurry  Psych: Appropriate mood and affect  Discharge Instructions Discharge Instructions    Discharge instructions    Complete by:  As directed    Patient will be discharged to [facility].  Patient will need to follow up with primary care provider within one week of discharge. Continue to monitor INR daily until INR is between 2-3.  Patient should continue medications as prescribed.  Patient should follow a renal/carb modified diet with 2000 ml fluid restriction per day.     Current Discharge Medication List    START taking these medications   Details  atorvastatin (LIPITOR) 80 MG tablet Take 1 tablet (80 mg total) by mouth daily at 6 PM. Qty: 30 tablet, Refills: 0    warfarin (COUMADIN) 5 MG tablet Take 1 tablet (5 mg total) by mouth one time  only at 6 PM. Qty: 30 tablet, Refills: 0      CONTINUE these medications which have NOT CHANGED   Details  acetaminophen (TYLENOL) 325 MG tablet Take 650 mg by mouth every 4 (four) hours as needed for moderate pain.    ascorbic acid (VITAMIN C) 500 MG tablet Take 500 mg by mouth daily.    aspirin 81 MG chewable tablet Chew 81 mg by mouth daily.    famotidine (PEPCID) 20 MG tablet Take 20 mg by mouth daily.    ferrous sulfate 325 (65 FE) MG tablet Take 325 mg by mouth 2 (two) times daily with a meal.    insulin lispro (HUMALOG) 100 UNIT/ML injection Inject 0-10 Units into the skin 3 (three) times daily before meals. 70-150 units=0 units,  151-200=200 units,  201-250=4 units,  251-300=6 units, 301-350= 8 units, 351 += 10 units, 400 or higher CALL MD    isosorbide mononitrate (IMDUR) 30 MG 24 hr tablet Take 30 mg by mouth daily.    metoprolol tartrate (LOPRESSOR) 50 MG tablet Take 50 mg by mouth 2 (two) times daily.    nitroGLYCERIN (NITROSTAT) 0.4 MG SL tablet Place 0.4 mg under the tongue every 5 (five) minutes as needed for chest pain.    Nutritional Supplements (FEEDING SUPPLEMENT, NEPRO CARB STEADY,) LIQD Take 237 mLs by mouth daily.    pantoprazole (PROTONIX) 40 MG tablet Take 40 mg by mouth daily.      STOP taking these medications     apixaban (ELIQUIS) 2.5 MG TABS tablet      furosemide (LASIX) 40 MG tablet      lisinopril (PRINIVIL,ZESTRIL) 10 MG tablet        No Known Allergies  Contact information for follow-up providers    Blass,  Fara Olden, MD. Schedule an appointment as soon as possible for a visit in 1 week(s).   Specialty:  Internal Medicine Why:  Hospital follow up Contact information: [place] [address] [phone]            Contact information for after-discharge care    Destination    [facility] Follow up.   Specialty:  Skilled Nursing Facility Contact information: [place]  [place] [phone]                   The results of significant diagnostics from this hospitalization (including imaging, microbiology, ancillary and laboratory) are listed below for reference.    Significant Diagnostic Studies: Ct Angio Head W Or Wo Contrast  Result Date: 09/09/2016 CLINICAL DATA:  Stroke.  Multiple embolic infarctions. EXAM: CT ANGIOGRAPHY HEAD AND NECK TECHNIQUE: Multidetector CT imaging of the head and neck was performed using the standard protocol during bolus administration of intravenous contrast. Multiplanar CT image reconstructions and MIPs were obtained to evaluate the vascular anatomy. Carotid stenosis measurements (when applicable) are obtained utilizing NASCET criteria, using the distal internal carotid diameter as the denominator. CONTRAST:  50 mL Isovue 370 COMPARISON:  Brain MRI 09/08/2016 FINDINGS: CT HEAD FINDINGS Brain: There is hypoattenuation within both occipital lobes at sites of known infarcts. The numerous foci of ischemia identified elsewhere on the recent MRI brain are not clearly visible on this study. Chronic left parietal encephalomalacia is unchanged. Right frontal convexity meningioma. Vascular: No hyperdense vessel or unexpected calcification. Skull: Normal visualized skull base, calvarium and extracranial soft tissues. Sinuses/Orbits: No sinus fluid levels or advanced mucosal thickening. No mastoid effusion. Normal orbits. CTA NECK FINDINGS Aortic arch: There is no aneurysm or dissection of the visualized ascending aorta or aortic arch. There is a normal variant aortic arch branching pattern with the brachiocephalic and left common carotid arteries sharing a common origin. The visualized proximal subclavian arteries are normal. Right carotid system: The right common carotid origin is widely patent. There is no common carotid or internal carotid artery dissection or aneurysm. No hemodynamically significant stenosis. Left carotid system: The  left common carotid origin is widely patent. There is no common carotid or internal carotid artery dissection or aneurysm. No hemodynamically significant stenosis. Vertebral arteries: The vertebral system is codominant. Both vertebral artery origins are normal. Both vertebral arteries are normal to their confluence with the basilar artery. Skeleton: Multilevel degenerative disc disease without bony spinal canal stenosis. Other neck: The nasopharynx is clear. The oropharynx and hypopharynx are normal. The epiglottis is normal. The supraglottic larynx, glottis and subglottic larynx are normal. No retropharyngeal collection. The parapharyngeal spaces are preserved. The parotid and submandibular glands are normal. No sialolithiasis or salivary ductal dilatation. The thyroid gland is normal. There is no cervical lymphadenopathy. Upper chest: Large bilateral pleural effusions. Review of the MIP images confirms the above findings CTA HEAD FINDINGS Anterior circulation: --Intracranial internal carotid arteries: Normal. --Anterior cerebral arteries: Normal. --Middle cerebral arteries: Normal. --Posterior communicating arteries: Absent bilaterally. Posterior circulation: --Posterior cerebral arteries: Normal. --Superior cerebellar arteries: Normal. --Basilar artery: Normal. --Anterior inferior cerebellar arteries: Normal. --Posterior inferior cerebellar arteries: Normal. Venous sinuses: As permitted by contrast timing, patent. Anatomic variants: None Delayed phase: No parenchymal contrast enhancement. Review of the MIP images confirms the above findings IMPRESSION: 1. No emergent large vessel occlusion. 2. No high-grade stenosis or aneurysm of the intracranial arteries. 3. No potential embolic source identified. The carotid systems and  aortic arch are free of atherosclerotic disease. In this context, echocardiography is recommended to assess for potential intracardiac thrombus. 4. Bioccipital hypoattenuation consistent with  known acute infarcts. 5. Large bilateral pleural effusions. Electronically Signed   By: Ulyses Jarred M.D.   On: 09/09/2016 00:58   Ct Head Wo Contrast  Result Date: 09/07/2016 CLINICAL DATA:  New onset blindness. EXAM: CT HEAD WITHOUT CONTRAST TECHNIQUE: Contiguous axial images were obtained from the base of the skull through the vertex without intravenous contrast. COMPARISON:  None. FINDINGS: Brain: Generalized brain atrophy. There chronic small-vessel ischemic changes affecting the brainstem. Old small vessel cerebellar infarctions. Cerebral hemispheres show subacute infarction in both parieto-occipital junction regions, more extensive on the left than the right. Mild swelling but no evidence of hemorrhage. Elsewhere, the brain shows extensive chronic small-vessel ischemic changes throughout the white matter. There are old lacunar infarctions affecting the thalami and basal ganglia. No evidence of hemorrhage, hydrocephalus or extra-axial fluid collection. No intra-axial mass. There is a right frontal convexity meningioma measuring 3.6 by 3.1 by 1.6 cm. Minimal mass effect upon the right frontal lobe, not significant. Vascular: There is atherosclerotic calcification of the major vessels at the base of the brain. Skull: Negative Sinuses/Orbits: Clear/normal Other: None significant IMPRESSION: Subacute infarctions in both parieto-occipital regions, more extensive on the left than the right. Mild swelling but no evidence of mass effect or shift. No hemorrhage. Extensive old ischemic changes elsewhere as outlined above. Right frontal convexity meningioma. Electronically Signed   By: Nelson Chimes M.D.   On: 09/07/2016 15:45   Ct Angio Neck W Or Wo Contrast  Result Date: 09/09/2016 CLINICAL DATA:  Stroke.  Multiple embolic infarctions. EXAM: CT ANGIOGRAPHY HEAD AND NECK TECHNIQUE: Multidetector CT imaging of the head and neck was performed using the standard protocol during bolus administration of intravenous  contrast. Multiplanar CT image reconstructions and MIPs were obtained to evaluate the vascular anatomy. Carotid stenosis measurements (when applicable) are obtained utilizing NASCET criteria, using the distal internal carotid diameter as the denominator. CONTRAST:  50 mL Isovue 370 COMPARISON:  Brain MRI 09/08/2016 FINDINGS: CT HEAD FINDINGS Brain: There is hypoattenuation within both occipital lobes at sites of known infarcts. The numerous foci of ischemia identified elsewhere on the recent MRI brain are not clearly visible on this study. Chronic left parietal encephalomalacia is unchanged. Right frontal convexity meningioma. Vascular: No hyperdense vessel or unexpected calcification. Skull: Normal visualized skull base, calvarium and extracranial soft tissues. Sinuses/Orbits: No sinus fluid levels or advanced mucosal thickening. No mastoid effusion. Normal orbits. CTA NECK FINDINGS Aortic arch: There is no aneurysm or dissection of the visualized ascending aorta or aortic arch. There is a normal variant aortic arch branching pattern with the brachiocephalic and left common carotid arteries sharing a common origin. The visualized proximal subclavian arteries are normal. Right carotid system: The right common carotid origin is widely patent. There is no common carotid or internal carotid artery dissection or aneurysm. No hemodynamically significant stenosis. Left carotid system: The left common carotid origin is widely patent. There is no common carotid or internal carotid artery dissection or aneurysm. No hemodynamically significant stenosis. Vertebral arteries: The vertebral system is codominant. Both vertebral artery origins are normal. Both vertebral arteries are normal to their confluence with the basilar artery. Skeleton: Multilevel degenerative disc disease without bony spinal canal stenosis. Other neck: The nasopharynx is clear. The oropharynx and hypopharynx are normal. The epiglottis is normal. The  supraglottic larynx, glottis and subglottic larynx are normal. No retropharyngeal  collection. The parapharyngeal spaces are preserved. The parotid and submandibular glands are normal. No sialolithiasis or salivary ductal dilatation. The thyroid gland is normal. There is no cervical lymphadenopathy. Upper chest: Large bilateral pleural effusions. Review of the MIP images confirms the above findings CTA HEAD FINDINGS Anterior circulation: --Intracranial internal carotid arteries: Normal. --Anterior cerebral arteries: Normal. --Middle cerebral arteries: Normal. --Posterior communicating arteries: Absent bilaterally. Posterior circulation: --Posterior cerebral arteries: Normal. --Superior cerebellar arteries: Normal. --Basilar artery: Normal. --Anterior inferior cerebellar arteries: Normal. --Posterior inferior cerebellar arteries: Normal. Venous sinuses: As permitted by contrast timing, patent. Anatomic variants: None Delayed phase: No parenchymal contrast enhancement. Review of the MIP images confirms the above findings IMPRESSION: 1. No emergent large vessel occlusion. 2. No high-grade stenosis or aneurysm of the intracranial arteries. 3. No potential embolic source identified. The carotid systems and aortic arch are free of atherosclerotic disease. In this context, echocardiography is recommended to assess for potential intracardiac thrombus. 4. Bioccipital hypoattenuation consistent with known acute infarcts. 5. Large bilateral pleural effusions. Electronically Signed   By: Ulyses Jarred M.D.   On: 09/09/2016 00:58   Mr Brain Wo Contrast  Result Date: 09/08/2016 CLINICAL DATA:  Became unresponsive yesterday.  New onset blindness. EXAM: MRI HEAD WITHOUT CONTRAST TECHNIQUE: Multiplanar, multiecho pulse sequences of the brain and surrounding structures were obtained without intravenous contrast. COMPARISON:  CT 09/07/2016 FINDINGS: Brain: Diffusion imaging shows acute infarctions scattered throughout all vascular  distributions consistent with embolic disease from the heart or ascending aorta. There are scattered subcentimeter infarctions affecting both cerebellar hemispheres. No brainstem infarction. There are scattered infarctions within both cerebral hemispheres, the largest in the parieto-occipital regions. None show hemorrhage. There is old infarction in the left parietal cortical and subcortical brain. There are extensive chronic small vessel changes of the hemispheric white matter. No sign of mass effect or shift. No hydrocephalus. No extra-axial fluid collection. As shown by CT, there is a right frontal convexity meningioma measuring approximately 3.4 cm in diameter with a thickness of 15 mm. No significant mass effect upon the brain. Vascular: Major vessels at the base of the brain show flow. Skull and upper cervical spine: Negative Sinuses/Orbits: Clear/ normal.  Small mastoid effusions. Other: None significant IMPRESSION: Acute embolic infarctions throughout the cerebellum and both cerebral hemispheres consistent with cardiac or ascending aortic origin. Largest regions of acute infarction are in the parieto-occipital regions bilaterally. No sign of hemorrhagic transformation. No mass effect or shift. Old left parietal cortical and subcortical infarction. Old small vessel infarctions of the deep white matter. Right frontal convexity meningioma without significant mass effect. Electronically Signed   By: Nelson Chimes M.D.   On: 09/08/2016 18:48   Nm Gi Blood Loss  Result Date: 09/04/2016 CLINICAL DATA:  Gastrointestinal bleeding this morning, subsiding by afternoon. EXAM: NUCLEAR MEDICINE GASTROINTESTINAL BLEEDING SCAN TECHNIQUE: Sequential abdominal images were obtained following intravenous administration of Tc-26m labeled red blood cells. RADIOPHARMACEUTICALS:  22 mCi Tc-63m in-vitro labeled red cells. COMPARISON:  None. FINDINGS: A total of 2 hours of imaging of the abdomen and pelvis was performed. There  appears to be some activity within the pelvis during the first hour, frame three of the images recorded CT 5 minutes intervals. Similar tracer activity is seen within what may represent the rectosigmoid on the 1 minutes interval images, image 34/60 through 44/60 1 of the images taken during the first hour. IMPRESSION: Subtle tracer activity suggested within the lower pelvis likely within the rectosigmoid that may represent the site of reported gastrointestinal  bleeding. Electronically Signed   By: Ashley Royalty M.D.   On: 09/04/2016 19:24   Dg Chest Port 1 View  Result Date: 09/11/2016 CLINICAL DATA:  Follow-up examination for pulmonary edema. EXAM: PORTABLE CHEST 1 VIEW COMPARISON:  Prior radiograph from 09/07/2016. FINDINGS: Right-sided dialysis catheter noted, stable. Stable cardiomegaly. Mediastinal silhouette unchanged. Lungs normally inflated. Previously seen pulmonary edema is improved relative to previous exam, with persistent mild perihilar vascular congestion. Small bilateral pleural effusions remain, also likely slightly improved. Associated bibasilar opacities likely atelectasis. No new focal infiltrates. No pneumothorax. Osseous structures within normal limits. IMPRESSION: 1. Cardiomegaly with improved pulmonary edema as compared to 7/20 1/18. Persistent mild perihilar vascular congestion. 2. Persistent bilateral pleural effusions with associated atelectasis. Electronically Signed   By: Jeannine Boga M.D.   On: 09/11/2016 13:00   Dg Chest Port 1 View  Result Date: 09/07/2016 CLINICAL DATA:  Respiratory failure EXAM: PORTABLE CHEST 1 VIEW COMPARISON:  09/05/2016 FINDINGS: Chronic cardiopericardial enlargement. Dialysis catheter from the right with tips at the right atrium. A nasogastric tube has been removed. Haziness of the chest from pleural fluid, possibly moderate on the right. No pneumothorax or air bronchogram. IMPRESSION: Cardiomegaly with pulmonary edema and pleural effusions. No  change from 2 days prior. Electronically Signed   By: Monte Fantasia M.D.   On: 09/07/2016 07:15   Dg Chest Port 1 View  Result Date: 09/05/2016 CLINICAL DATA:  CHF. EXAM: PORTABLE CHEST 1 VIEW COMPARISON:  09/04/2016. FINDINGS: Interim placement of NG tube, its tip is below left hemidiaphragm. Right IJ dual-lumen catheter in stable position. Cardiomegaly with bilateral interstitial prominence and bilateral pleural effusions consistent with CHF. Bibasilar pneumonia cannot be excluded. No change from prior exam. No pneumothorax. IMPRESSION: 1. Interim placement of NG tube, its tip is below left hemidiaphragm. Right IJ dual-lumen catheter stable position. 2. Congestive heart failure with pulmonary edema and small right pleural effusion. No change from prior exam. Bibasilar pneumonia again cannot be excluded . Electronically Signed   By: Marcello Moores  Register   On: 09/05/2016 07:53   Dg Chest Port 1 View  Result Date: 09/04/2016 CLINICAL DATA:  GI bleed.  Possible sepsis . EXAM: PORTABLE CHEST 1 VIEW COMPARISON:  No recent prior . FINDINGS: Right IJ dual-lumen catheter with tip projected over the right atrium. Cardiomegaly with bilateral pulmonary infiltrates consistent with pulmonary edema, right side greater than. Small right pleural effusion. IMPRESSION: 1. Right IJ dual-lumen catheter with tip projected over right atrium. 2. Congestive heart failure with pulmonary edema, right side greater than left. Basilar pneumonia cannot be excluded. Right-sided pleural effusion. Electronically Signed   By: Marcello Moores  Register   On: 09/04/2016 07:32   Ct Angio Abdomen Pelvis  W &/or Wo Contrast  Result Date: 09/04/2016 CLINICAL DATA:  66 year old male with multiple medical problems currently anticoagulation on Eliquis with recent GI bleed. EXAM: CT ANGIOGRAPHY ABDOMEN AND PELVIS WITH CONTRAST AND WITHOUT CONTRAST TECHNIQUE: Multidetector CT imaging of the abdomen and pelvis was performed using the standard protocol during  bolus administration of intravenous contrast. Multiplanar reconstructed images and MIPs were obtained and reviewed to evaluate the vascular anatomy. CONTRAST:  100 mL Isovue 370 COMPARISON:  None. FINDINGS: VASCULAR Aorta: Normal caliber aorta without aneurysm, dissection, vasculitis or significant stenosis. Celiac: Patent without evidence of aneurysm, dissection, vasculitis or significant stenosis. SMA: Patent without evidence of aneurysm, dissection, vasculitis or significant stenosis. Renals: 1 right and 2 left renal arteries. No significant stenosis or evidence of fibromuscular dysplasia. IMA: Patent without evidence  of aneurysm, dissection, vasculitis or significant stenosis. Inflow: Small penetrating atherosclerotic ulcer arising from the posteroinferior aspect of the right common iliac artery. Significant stenosis of the origin of the right internal iliac artery. The remaining common, internal and external iliac arteries are patent without evidence of stenosis. Proximal Outflow: Bilateral common femoral and visualized portions of the superficial and profunda femoral arteries are patent without evidence of aneurysm, dissection, vasculitis or significant stenosis. Veins: Right femoral approach central venous catheter with the tip terminating in the right common iliac vein. No acute venous abnormality. Review of the MIP images confirms the above findings. NON-VASCULAR Lower chest: Large right and moderate left layering pleural effusions with associated bibasilar atelectasis. Cardiomegaly. No pericardial effusion. Incompletely imaged nasogastric tube which is coiled in the stomach. Hepatobiliary: Nodular hepatic contour consistent with cirrhosis. No focal lesion identified. Unremarkable gallbladder and biliary system. Pancreas: Unremarkable. No pancreatic ductal dilatation or surrounding inflammatory changes. Spleen: Normal in size without focal abnormality. Adrenals/Urinary Tract: Unremarkable adrenal glands.  Symmetric renal size bilaterally. Small focus of hypoenhancement in the anterior aspect of the left lower renal pole. The ureters are unremarkable. The bladder is diffusely thick walled. Stomach/Bowel: No focal bowel wall thickening or evidence of obstruction. There are several scattered sigmoid colonic diverticular which are moderately high in attenuation. There is no evidence of vascular pooling on the venous phase to suggest active bleeding. Lymphatic: No suspicious lymphadenopathy. Reproductive: Prostatomegaly. Other: Anasarca. No abdominal wall hernia. Mild to moderate ascites. Musculoskeletal: No acute fracture or aggressive appearing lytic or blastic osseous lesion. IMPRESSION: VASCULAR 1. No evidence of active GI bleed at this time. 2. Small penetrating atherosclerotic ulcer arising from the posteroinferior aspect of the right common iliac artery. NON-VASCULAR 1. Focal hypoenhancement in the lower pole of the left kidney may represent a mildly irregular cyst, or perhaps a region of focal pyelonephritis. Recommend clinical correlation with urinalysis to exclude evidence of urinary tract infection. 2. Scattered sigmoid colonic diverticula. 3. Anasarca, large right and moderate left pleural effusions. In the setting of cardiomegaly, these features suggest congestive heart failure versus volume overload versus third-spacing. 4. Morphologic features of hepatic cirrhosis. The presence of ascites may reflect portal hypertension, or be related to the CHF or volume overload described above. Signed, Criselda Peaches, MD [facility] Electronically Signed   By: Jacqulynn Cadet M.D.   On: 09/04/2016 15:29    Microbiology: Recent Results (from the past 240 hour(s))  Culture, blood (Routine x 2)     Status: None   Collection Time: 09/04/16  2:12 AM  Result Value Ref Range Status   Specimen Description BLOOD BLOOD RIGHT FOREARM  Final   Special  Requests IN PEDIATRIC BOTTLE Blood Culture adequate volume  Final   Culture   Final    NO GROWTH 5 DAYS Performed at [facility], [address] [place], [place] [address]    Report Status 09/10/2016 FINAL  Final  Culture, blood (Routine x 2)     Status: None   Collection Time: 09/04/16  6:15 AM  Result Value Ref Range Status   Specimen Description BLOOD RIGHT HAND  Final   Special Requests   Final    BOTTLES DRAWN AEROBIC AND ANAEROBIC Blood Culture adequate volume   Culture   Final    NO GROWTH 5 DAYS Performed at [facility], [place] [address] [place], [place] [address]    Report Status 09/09/2016 FINAL  Final  MRSA PCR  Screening     Status: None   Collection Time: 09/04/16  9:27 AM  Result Value Ref Range Status   MRSA by PCR NEGATIVE NEGATIVE Final    Comment:        The GeneXpert MRSA Assay (FDA approved for NASAL specimens only), is one component of a comprehensive MRSA colonization surveillance program. It is not intended to diagnose MRSA infection nor to guide or monitor treatment for MRSA infections.      Labs: Basic Metabolic Panel:  Recent Labs Lab 09/08/16 0929 09/09/16 0444 09/10/16 0845 09/12/16 0503 09/13/16 0752  NA 134* 133* 133* 133* 135  K 4.4 3.6 3.9 3.4* 3.2*  CL 103 100* 99* 100* 100*  CO2 23 24 25 24 27   GLUCOSE 87 83 82 123* 118*  BUN 27* 13 16 16 14   CREATININE 3.40* 2.60* 3.21* 3.15* 2.46*  CALCIUM 8.3* 8.1* 8.0* 7.5* 7.7*  MG 2.0  --  1.8  --   --   PHOS 3.2  --   --   --   --    Liver Function Tests:  Recent Labs Lab 09/08/16 0929 09/12/16 0503  AST 38 18  ALT 18 13*  ALKPHOS 55 63  BILITOT 1.5* 0.6  PROT 5.6* 5.4*  ALBUMIN 2.9* 2.4*   No results for input(s): LIPASE, AMYLASE in the last 168 hours. No results for input(s): AMMONIA in the last 168 hours. CBC:  Recent Labs Lab 09/06/16 1600 09/06/16 2050 09/07/16 0400  09/08/16 0929 09/09/16 0444 09/10/16 0609 09/12/16 0503 09/13/16 0752  WBC  14.2* 14.5* 13.0*  --  13.2* 11.8* 13.3* 11.9* 12.5*  NEUTROABS 12.2* 12.6* 11.0*  --  11.0*  --   --   --   --   HGB 8.1* 7.7* 7.2*  < > 9.5* 9.2* 9.9* 8.6* 9.2*  HCT 23.6* 21.8* 21.2*  < > 28.0* 28.0* 30.5* 25.7* 27.8*  MCV 84.6 84.8 86.5  --  86.7 86.2 87.4 86.2 86.3  PLT 125* 117* 118*  --  158 172 199 216 219  < > = values in this interval not displayed. Cardiac Enzymes:  Recent Labs Lab 09/08/16 0929 09/09/16 0444 09/10/16 0609  TROPONINI 16.69* 16.97* 10.04*   BNP: BNP (last 3 results) No results for input(s): BNP in the last 8760 hours.  ProBNP (last 3 results) No results for input(s): PROBNP in the last 8760 hours.  CBG:  Recent Labs Lab 09/12/16 1144 09/12/16 1828 09/12/16 2037 09/13/16 0748 09/13/16 1203  GLUCAP 124* 135* 162* 122* 164*       Signed:  Jariana Shumard  [facility] 09/13/2016, 1:14 PM

## 2016-09-13 NOTE — Clinical Social Work Note (Signed)
Patient medically stable for discharge back to [facility] today. Admissions director Rollene Fare and Elnita Maxwell advised and request made for patient to [facility] ([address] after 7 pm. Discharge clinicals transmitted to facility. Attempts made to reach contact person listed in [place] [phone]) and message left. Mr. Breeze will be transported to facility by ambulance.  Monteen Toops Givens, MSW, LCSW Licensed Clinical Social Worker [place] [phone]

## 2016-09-13 NOTE — Progress Notes (Signed)
Subjective:  States tolerated HD yest. On schedule, /cos  "Blurry  Vision, BUT BETTER than on admit =Just black then .  slowy getting better"  Objective Vital signs in last 24 hours: Vitals:   09/12/16 1830 09/12/16 2041 09/13/16 0554 09/13/16 0911  BP: 131/89 106/63 123/73 127/75  Pulse: (!) 103 (!) 105 (!) 102 (!) 103  Resp: 17 16 16 17   Temp: 98 F (36.7 C) 98.4 F (36.9 C) 98.5 F (36.9 C) 98.2 F (36.8 C)  TempSrc: Oral Oral Oral Oral  SpO2: 100% 100% 98% 99%  Weight:  78.5 kg (173 lb)    Height:       Weight change: -2.948 kg (-6 lb 8 oz)  Physical Exam: GEN= Now alert  Oriented to to [facility] and date /Date cachectic, NAD AAM PULM= CTA , unlabored, diminished at bases  CARD= Ireg , irreg  no m/r/g ABD= BS  Pos , soft , nontender  nontender GU:  Foley with  No Hematuria EXT s/p L BKA,  No pedal edema/ R leg with 2 ulcers, dressed on the dorsum of the foot and the R shin. R foot is cool   Dialysis ACCESS: R IJ permcath  CXR 7/21 - CHF, CM  Dialysis orders: [facility] /  TTS 4h   3K/ 3Ca bath  EDW 159 lbs (= 72.5kg)  350/800  T IJ TDC  Hep 3800 (holding for now d/t GIB)  Problem/Plan: 1 Hemorrhagic shock 2/2 acute lower GIB: likely diverticular; colonoscopy w/ severe pan-diverticulosis Transfuse PRN. Per PCCM and GI.  2  Acute bilat CVA's - cerebellar/ parieto-occipital, per neuro complication of afib. +cortical blindness. Per pt Vision improving some / Per neuro recommends switch Eliquis to coumadin given ESRD/ dialysis.  3  NSTEMI: likely demand from ABLA.  Not a candidate for cath per cardiology c/s note 7/20. 4  Afib on Eliquis: received KCentra, got  FEIBA. No longer candidate for DOAC. On MTP.  5 ESRD: TTS HD.  Hold heparin on HD for now/ K 3.2  replace po/  Use  Added K bath in AM HD  6  HTN/Volume - at dry weight now BP's soft, on home MTP here. Home lisinopril on hold, resume prn.  7  Anemia of ESRD/ ABLA:   Am HGB  9.2  / Admit  Transfused blood  products, admit 7.2 hgb  will give iron and ESA as appropriate/ if am  hgb < 11 start Aranesp 60 mcg q sat hd  8 Metabolic Bone Disease: phos 3.2 currently not  on binders/  and check recors from [facility] for Vit d  Ca corrected = 9.0  9  Dispo - back to SNF when ready  Ernest Haber, PA-C [facility] [phone] 09/13/2016,10:06 AM  LOS: 9 days   Pt seen, examined and agree w A/P as above.  Kelly Splinter MD [employer] pager [phone]   09/13/2016, 1:14 PM

## 2016-09-13 NOTE — Progress Notes (Signed)
Occupational Therapy Treatment Patient Details Name: George Burgess MRN: [id number] DOB: 1950-09-18 Today's Date: 09/13/2016    History of present illness Patient is a 66 y/o male who presents from SNF due to having 2 large volume maroon colored stools and BP in the 70s. Pt's Hgb was 6.3; was given K centra and 2 units of blood in the ED but remained hypotensive. Admitted for hemorhaggic shock, NSTEMI and acute anemia. MRI- Acute bilateral cerebrum and cerebellar infarctions, with the largest in the parieto-occipital regions bilaterally. PMH includes chronic Afib, chronic systolic CHF (EF 12%), prior CVA, vascular dementia, HTN, DM2, PVD, prior Left BKA, and chronic LE wounds    OT comments  Pt fatigued quickly after sitting EOB  Follow Up Recommendations  SNF;Supervision/Assistance - 24 hour    Equipment Recommendations  None recommended by OT    Recommendations for Other Services      Precautions / Restrictions         Mobility Bed Mobility Overal bed mobility: Needs Assistance Bed Mobility: Supine to Sit;Sit to Supine     Supine to sit: Min assist Sit to supine: Min guard      Transfers                      Balance Overall balance assessment: Needs assistance Sitting-balance support: Feet supported Sitting balance-Leahy Scale: Fair     Standing balance support: Bilateral upper extremity supported;Single extremity supported Standing balance-Leahy Scale: Fair                             ADL either performed or assessed with clinical judgement   ADL Overall ADL's : Needs assistance/impaired Eating/Feeding: Minimal assistance;Sitting   Grooming: Wash/dry face;Oral care;Set up;Sitting;Supervision/safety                                 General ADL Comments: pt sat EOB for 5 min before wanting to lie back down. after sitting up with OT pt stated he had been in chair today and was fatigued               Cognition  Arousal/Alertness: Awake/alert Behavior During Therapy: WFL for tasks assessed/performed Overall Cognitive Status: History of cognitive impairments - at baseline                                                     Pertinent Vitals/ Pain       Pain Assessment: No/denies pain         Frequency  Min 2X/week        Progress Toward Goals  OT Goals(current goals can now be found in the care plan section)  Progress towards OT goals: Progressing toward goals     Plan Discharge plan remains appropriate    Co-evaluation                 AM-PAC PT "6 Clicks" Daily Activity     Outcome Measure   Help from another person eating meals?: None Help from another person taking care of personal grooming?: A Little Help from another person toileting, which includes using toliet, bedpan, or urinal?: A Lot Help from another person bathing (including washing, rinsing, drying)?: A Little Help from  another person to put on and taking off regular upper body clothing?: A Lot Help from another person to put on and taking off regular lower body clothing?: A Lot 6 Click Score: 16    End of Session    OT Visit Diagnosis: Unsteadiness on feet (R26.81);Low vision, both eyes (H54.2)   Activity Tolerance Patient tolerated treatment well   Patient Left in chair;with call bell/phone within reach;with chair alarm set   Nurse Communication Mobility status        Time: 1215-1226 OT Time Calculation (min): 11 min  Charges: OT General Charges $OT Visit: 1 Procedure OT Treatments $Self Care/Home Management : 8-22 mins  [place], [place]   Payton Mccallum D 09/13/2016, 1:11 PM

## 2016-09-16 NOTE — Care Management Important Message (Signed)
Important Message  Patient Details  Name: George Burgess MRN: [id number] Date of Birth: [birth year]   Medicare Important Message Given:  Yes Patient signed on 09/13/2016    Orbie Pyo 09/16/2016, 9:08 AM

## 2016-09-18 LAB — HEMOGLOBIN A1C
HEMOGLOBIN A1C: 5.4 % (ref 4.8–5.6)
MEAN PLASMA GLUCOSE: 108 mg/dL

## 2016-11-11 ENCOUNTER — Emergency Department (HOSPITAL_COMMUNITY)
Admission: EM | Admit: 2016-11-11 | Discharge: 2016-11-11 | Disposition: A | Discharge status: Home / Self Care | Payer: Medicare Other | Attending: Emergency Medicine | Admitting: Emergency Medicine

## 2016-11-11 ENCOUNTER — Encounter (HOSPITAL_COMMUNITY): Payer: Self-pay

## 2016-11-11 ENCOUNTER — Emergency Department (HOSPITAL_COMMUNITY): Payer: Medicare Other

## 2016-11-11 DIAGNOSIS — D649 Anemia, unspecified: Secondary | ICD-10-CM | POA: Diagnosis not present

## 2016-11-11 DIAGNOSIS — Z794 Long term (current) use of insulin: Secondary | ICD-10-CM | POA: Insufficient documentation

## 2016-11-11 DIAGNOSIS — E875 Hyperkalemia: Secondary | ICD-10-CM | POA: Diagnosis not present

## 2016-11-11 DIAGNOSIS — E119 Type 2 diabetes mellitus without complications: Secondary | ICD-10-CM | POA: Diagnosis not present

## 2016-11-11 DIAGNOSIS — Z8673 Personal history of transient ischemic attack (TIA), and cerebral infarction without residual deficits: Secondary | ICD-10-CM | POA: Insufficient documentation

## 2016-11-11 DIAGNOSIS — Z7901 Long term (current) use of anticoagulants: Secondary | ICD-10-CM | POA: Insufficient documentation

## 2016-11-11 DIAGNOSIS — Z7982 Long term (current) use of aspirin: Secondary | ICD-10-CM | POA: Diagnosis not present

## 2016-11-11 DIAGNOSIS — I120 Hypertensive chronic kidney disease with stage 5 chronic kidney disease or end stage renal disease: Secondary | ICD-10-CM | POA: Diagnosis not present

## 2016-11-11 DIAGNOSIS — Z87891 Personal history of nicotine dependence: Secondary | ICD-10-CM | POA: Insufficient documentation

## 2016-11-11 DIAGNOSIS — Z89512 Acquired absence of left leg below knee: Secondary | ICD-10-CM | POA: Insufficient documentation

## 2016-11-11 DIAGNOSIS — Z79899 Other long term (current) drug therapy: Secondary | ICD-10-CM | POA: Insufficient documentation

## 2016-11-11 DIAGNOSIS — R945 Abnormal results of liver function studies: Secondary | ICD-10-CM

## 2016-11-11 DIAGNOSIS — N186 End stage renal disease: Secondary | ICD-10-CM | POA: Insufficient documentation

## 2016-11-11 DIAGNOSIS — N390 Urinary tract infection, site not specified: Secondary | ICD-10-CM | POA: Diagnosis not present

## 2016-11-11 DIAGNOSIS — I4891 Unspecified atrial fibrillation: Secondary | ICD-10-CM | POA: Insufficient documentation

## 2016-11-11 DIAGNOSIS — R7989 Other specified abnormal findings of blood chemistry: Secondary | ICD-10-CM

## 2016-11-11 DIAGNOSIS — Z992 Dependence on renal dialysis: Secondary | ICD-10-CM | POA: Diagnosis present

## 2016-11-11 LAB — I-STAT CHEM 8, ED
BUN: 94 mg/dL — ABNORMAL HIGH (ref 6–20)
Calcium, Ion: 1.14 mmol/L — ABNORMAL LOW (ref 1.15–1.40)
Chloride: 104 mmol/L (ref 101–111)
Creatinine, Ser: 5.1 mg/dL — ABNORMAL HIGH (ref 0.61–1.24)
Glucose, Bld: 154 mg/dL — ABNORMAL HIGH (ref 65–99)
HCT: 41 % (ref 39.0–52.0)
Hemoglobin: 13.9 g/dL (ref 13.0–17.0)
Potassium: 6.3 mmol/L (ref 3.5–5.1)
Sodium: 135 mmol/L (ref 135–145)
TCO2: 23 mmol/L (ref 22–32)

## 2016-11-11 LAB — COMPREHENSIVE METABOLIC PANEL
ALK PHOS: 227 U/L — AB (ref 38–126)
ALT: 70 U/L — AB (ref 17–63)
AST: 67 U/L — ABNORMAL HIGH (ref 15–41)
Albumin: 3.7 g/dL (ref 3.5–5.0)
Anion gap: 12 (ref 5–15)
BILIRUBIN TOTAL: 1.1 mg/dL (ref 0.3–1.2)
BUN: 78 mg/dL — ABNORMAL HIGH (ref 6–20)
CALCIUM: 8.9 mg/dL (ref 8.9–10.3)
CO2: 19 mmol/L — ABNORMAL LOW (ref 22–32)
CREATININE: 5.13 mg/dL — AB (ref 0.61–1.24)
Chloride: 102 mmol/L (ref 101–111)
GFR, EST AFRICAN AMERICAN: 12 mL/min — AB (ref >60)
GFR, EST NON AFRICAN AMERICAN: 11 mL/min — AB (ref >60)
Glucose, Bld: 148 mg/dL — ABNORMAL HIGH (ref 65–99)
Potassium: 6.1 mmol/L — ABNORMAL HIGH (ref 3.5–5.1)
Sodium: 133 mmol/L — ABNORMAL LOW (ref 135–145)
Total Protein: 7 g/dL (ref 6.5–8.1)

## 2016-11-11 LAB — URINALYSIS, ROUTINE W REFLEX MICROSCOPIC
BILIRUBIN URINE: NEGATIVE
Glucose, UA: NEGATIVE mg/dL
Ketones, ur: NEGATIVE mg/dL
NITRITE: NEGATIVE
PH: 5 (ref 5.0–8.0)
Protein, ur: 100 mg/dL — AB
SPECIFIC GRAVITY, URINE: 1.013 (ref 1.005–1.030)
Squamous Epithelial / LPF: NONE SEEN

## 2016-11-11 LAB — CBC WITH DIFFERENTIAL/PLATELET
Basophils Absolute: 0 10*3/uL (ref 0.0–0.1)
Basophils Relative: 0 %
Eosinophils Absolute: 0.6 10*3/uL (ref 0.0–0.7)
Eosinophils Relative: 8 %
HCT: 37.7 % — ABNORMAL LOW (ref 39.0–52.0)
HEMOGLOBIN: 11.9 g/dL — AB (ref 13.0–17.0)
LYMPHS ABS: 0.9 10*3/uL (ref 0.7–4.0)
LYMPHS PCT: 12 %
MCH: 29.2 pg (ref 26.0–34.0)
MCHC: 31.6 g/dL (ref 30.0–36.0)
MCV: 92.4 fL (ref 78.0–100.0)
Monocytes Absolute: 0.7 10*3/uL (ref 0.1–1.0)
Monocytes Relative: 8 %
NEUTROS PCT: 72 %
Neutro Abs: 5.6 10*3/uL (ref 1.7–7.7)
Platelets: 207 10*3/uL (ref 150–400)
RBC: 4.08 MIL/uL — AB (ref 4.22–5.81)
RDW: 17 % — ABNORMAL HIGH (ref 11.5–15.5)
WBC: 7.7 10*3/uL (ref 4.0–10.5)

## 2016-11-11 LAB — PROTIME-INR
INR: 1.23
Prothrombin Time: 15.4 seconds — ABNORMAL HIGH (ref 11.4–15.2)

## 2016-11-11 MED ORDER — CEPHALEXIN 250 MG PO CAPS: 250.0000 mg | ORAL_CAPSULE | Freq: Two times a day (BID) | ORAL | 0 refills | Status: AC

## 2016-11-11 MED ORDER — SODIUM POLYSTYRENE SULFONATE 15 GM/60ML PO SUSP
60.0000 g | Freq: Once | ORAL | Status: AC
  Administered 2016-11-11: 60 g via ORAL
  Filled 2016-11-11: qty 240

## 2016-11-11 NOTE — ED Notes (Signed)
Dr Wilson Singer aware of pts istat results

## 2016-11-11 NOTE — ED Notes (Signed)
Report called to [facility] by Gretta Cool, [place]

## 2016-11-11 NOTE — ED Notes (Signed)
Pt verbalized understanding discharge instructions and denies any further needs or questions at this time. VS stable,

## 2016-11-11 NOTE — Discharge Instructions (Signed)
Your labs today showed that your potassium was slightly elevated, you were given kayexalate to help with this, but you will need to have dialysis tomorrow. Your urine showed signs of infection, take the antibiotic as directed until completed. Your INR today was subtherapeutic, but while you're on antibiotics it may increase that level; take tonight's dose of coumadin as prescribed, but hold tomorrow's dose and recheck the INR in 2 days to see what level it's at; ask your regular doctor about what to do with your doses during the time that you're on the antibiotic. Continue your home medications as prescribed. Stay well hydrated. Follow up with your regular doctor in 2-3 days for recheck of symptoms and ongoing management of your medical conditions. Return to the ER for emergent changes or worsening symptoms.

## 2016-11-11 NOTE — ED Triage Notes (Signed)
Pt from [facility], dialysis pt T/Th/sat. Pt has hx of dementia but is oriented x4 today. Per staff pt has refused dialysis Saturday and today. Pt told staff that someone told him he did not need dialysis anymore which is why he refused. Pt alert, no complaints, VSS [id number]

## 2016-11-11 NOTE — ED Notes (Signed)
This RN attempted IV access twice without success, another RN to try

## 2016-11-11 NOTE — ED Provider Notes (Signed)
George Burgess Provider Note   CSN: [id number] Arrival date & time: 11/11/16  1112     History   Chief Complaint Chief Complaint  Patient presents with  . Needs Dialysis    HPI George Burgess is a 66 y.o. male with a PMHx of paroxysmal Afib on coumadin, CHF, dementia, ESRD on dialysis Tu/Th/Sat, HTN, PVD s/p L BKA, CVA, DM2, and other medical conditions listed below, who presents to the ED via EMS from [facility] for evaluation after refusing to go to dialysis on Saturday and again today. Per patient, there was a black woman at the front office of [facility] who told him he didn't need to go to dialysis any more which is why he refused to go on Saturday. He states that he was under the impression that he no longer needed it, which is the only reason why he refused, and states that he is agreeable to go to dialysis if he needs it. He states that his last dialysis was on Thursday since he missed Saturday. He still makes urine. He denies any complaints or concerns, denies any pain anywhere, recent fevers, chills, cough, CP, SOB, abd pain, N/V/D/C, hematuria, dysuria, myalgias, arthralgias, numbness, tingling, focal weakness, or any other complaints at this time.   After speaking with Shirlean Mylar at [name], she provides additional information, stating that they sent him here for evaluation of AMS because he "usually is agreeable to go to Dialysis, but today and Saturday he was adamantly refusing to go, stating that a black woman told him he didn't need to go anymore". She states that other than refusing dialysis for the aforementioned reason, he is at his mental baseline and has no other "confusion" or alterations to his typical mental faculties. She denies that he's had any recent illness or issues, denies any recent cough, fevers, malodorous urine, changes in urination, n/v/d, or any other complaints/concerns/symptoms. She states that his last dialysis was Thursday.    The history is  provided by the patient and medical records. No language interpreter was used.  Altered Mental Status   This is a new problem. The current episode started 2 days ago. The problem has not changed since onset.Associated symptoms include confusion (per [facility], because he refused dialysis; otherwise at baseline). Pertinent negatives include no weakness. Risk factors: refused dialysis. His past medical history is significant for CVA and hypertension.    Past Medical History:  Diagnosis Date  . Atrial fibrillation ([place])    a. Chronic Eliquis (CHA2DS2VASc = 6).  . Chronic combined systolic (congestive) and diastolic (congestive) heart failure ([place])    a. Previously reported EF of 30%;  b. 08/2016 Echo: EF 40-45%, antsept, inf, infsept HK, Gr3 DD, mod AI/MR, sev dil LA, mild TR, PASP 33mHg.  .Marland KitchenDementia   . Encephalopathy   . [place] stage renal disease ([place]    a. On HD.  .Marland KitchenEssential hypertension   . GIB (gastrointestinal bleeding)    a. 08/2016 in setting of Eliquis Rx.  . Pleural effusion   . PVD (peripheral vascular disease) ([place]    a. s/p L BKA;  b. Chronic LE wounds.  . Stroke ([place]   . Type II diabetes mellitus ([facility]     Patient Active Problem List   Diagnosis Date Noted  . Blindness   . Bloody stools   . Encounter for nasogastric (NG) tube placement   . Lower GI bleed   . Malnutrition of moderate degree 09/05/2016  . Acute GI  bleeding 09/04/2016    Past Surgical History:  Procedure Laterality Date  . BELOW KNEE LEG AMPUTATION Left   . FLEXIBLE SIGMOIDOSCOPY N/A 09/07/2016   Procedure: FLEXIBLE SIGMOIDOSCOPY;  Surgeon: Manus Gunning, MD;  Location: [facility];  Service: Gastroenterology;  Laterality: N/A;       Home Medications    Prior to Admission medications   Medication Sig Start Date End Date Taking? Authorizing Provider  acetaminophen (TYLENOL) 325 MG tablet Take 650 mg by mouth every 4 (four) hours as needed for moderate pain.    [provider]  ascorbic acid (VITAMIN C) 500 MG tablet Take 500 mg by mouth daily.    [provider]  aspirin 81 MG chewable tablet Chew 81 mg by mouth daily.    [provider]  atorvastatin (LIPITOR) 80 MG tablet Take 1 tablet (80 mg total) by mouth daily at 6 PM. 09/13/16   Cristal Ford, DO  famotidine (PEPCID) 20 MG tablet Take 20 mg by mouth daily.    [provider]  ferrous sulfate 325 (65 FE) MG tablet Take 325 mg by mouth 2 (two) times daily with a meal.    [provider]  insulin lispro (HUMALOG) 100 UNIT/ML injection Inject 0-10 Units into the skin 3 (three) times daily before meals. 70-150 units=0 units,  151-200=200 units,  201-250=4 units,  251-300=6 units, 301-350= 8 units, 351 += 10 units, 400 or higher CALL MD    [provider]  isosorbide mononitrate (IMDUR) 30 MG 24 hr tablet Take 30 mg by mouth daily.    [provider]  metoprolol tartrate (LOPRESSOR) 50 MG tablet Take 50 mg by mouth 2 (two) times daily.    [provider]  nitroGLYCERIN (NITROSTAT) 0.4 MG SL tablet Place 0.4 mg under the tongue every 5 (five) minutes as needed for chest pain.    [provider]  Nutritional Supplements (FEEDING SUPPLEMENT, NEPRO CARB STEADY,) LIQD Take 237 mLs by mouth daily.    [provider]  pantoprazole (PROTONIX) 40 MG tablet Take 40 mg by mouth daily.    [provider]  warfarin (COUMADIN) 5 MG tablet Take 1 tablet (5 mg total) by mouth one time only at 6 PM. 09/13/16   Cristal Ford, DO    Family History Family History  Problem Relation Age of Onset  . Other Mother        Pt unsure of PMH of family members.    Social History Social History  Substance Use Topics  . Smoking status: Former Research scientist (life sciences)  . Smokeless tobacco: Never Used     Comment: Pt thinks he used to smoke cigarettes.  Thinks he quit many years ago.  . Alcohol use No     Allergies   Patient has no known  allergies.   Review of Systems Review of Systems  Constitutional: Negative for chills and fever.  Respiratory: Negative for cough and shortness of breath.   Cardiovascular: Negative for chest pain.  Gastrointestinal: Negative for abdominal pain, constipation, diarrhea, nausea and vomiting.  Genitourinary: Negative for dysuria and hematuria.  Musculoskeletal: Negative for arthralgias and myalgias.  Skin: Negative for color change.  Allergic/Immunologic: Positive for immunocompromised state (DM2).  Neurological: Negative for weakness and numbness.  Hematological: Bruises/bleeds easily (on coumadin).  Psychiatric/Behavioral: Positive for confusion (per [facility], because he refused dialysis; otherwise at baseline).   All other systems reviewed and are negative for acute change except as noted in the HPI.  Physical Exam Updated Vital Signs BP 133/88   Pulse (!) 59   Temp 97.8 F (36.6 C) (Oral)   Resp (!) 24   Wt 79.4 kg (175 lb)   SpO2 100%   BMI 23.73 kg/m   Physical Exam  Constitutional: He is oriented to person, place, and time. Vital signs are normal. He appears well-developed and well-nourished.  Non-toxic appearance. No distress.  Afebrile, nontoxic, NAD  HENT:  Head: Normocephalic and atraumatic.  Mouth/Throat: Oropharynx is clear and moist and mucous membranes are normal.  Eyes: Conjunctivae and EOM are normal. Right eye exhibits no discharge. Left eye exhibits no discharge.  Neck: Normal range of motion. Neck supple.  Cardiovascular: Normal rate, regular rhythm, normal heart sounds and intact distal pulses.  Exam reveals no gallop and no friction rub.   No murmur heard. Pulmonary/Chest: Effort normal and breath sounds normal. No respiratory distress. He has no decreased breath sounds. He has no wheezes. He has no rhonchi. He has no rales.  CTAB in all lung fields, no w/r/r, no hypoxia or increased WOB, speaking in full sentences, SpO2 100% on RA Dialysis  catheter/port on R upper chest, c/d/i, no evidence of infection  Abdominal: Soft. Normal appearance and bowel sounds are normal. He exhibits no distension. There is no tenderness. There is no rigidity, no rebound, no guarding, no CVA tenderness, no tenderness at McBurney's point and negative Murphy's sign.  Musculoskeletal: Normal range of motion.  L BKA MAE x4 Strength and sensation grossly intact in all extremities Distal pulses intact Trace b/l pedal edema up to just below the knee bilaterally  Neurological: He is alert and oriented to person, place, and time. He has normal strength. No cranial nerve deficit or sensory deficit. Coordination normal. GCS eye subscore is 4. GCS verbal subscore is 5. GCS motor subscore is 6.  No focal neuro deficits, A&O x4, strength and sensation grossly intact in all extremities, coordination WNL  Skin: Skin is warm, dry and intact. No rash noted.  Psychiatric: He has a normal mood and affect.  Nursing note and vitals reviewed.    ED Treatments / Results  Labs (all labs ordered are listed, but only abnormal results are displayed) Labs Reviewed  CBC WITH DIFFERENTIAL/PLATELET - Abnormal; Notable for the following:       Result Value   RBC 4.08 (*)    Hemoglobin 11.9 (*)    HCT 37.7 (*)    RDW 17.0 (*)    All other components within normal limits  COMPREHENSIVE METABOLIC PANEL - Abnormal; Notable for the following:    Sodium 133 (*)    Potassium 6.1 (*)    CO2 19 (*)    Glucose, Bld 148 (*)    BUN 78 (*)    Creatinine, Ser 5.13 (*)    AST 67 (*)    ALT 70 (*)    Alkaline Phosphatase 227 (*)    GFR calc non Af Amer 11 (*)    GFR calc Af Amer 12 (*)    All other components within normal limits  URINALYSIS, ROUTINE W REFLEX MICROSCOPIC - Abnormal; Notable for the following:    APPearance HAZY (*)    Hgb urine dipstick SMALL (*)    Protein, ur 100 (*)    Leukocytes, UA LARGE (*)    Bacteria, UA MANY (*)    All other components within normal  limits  PROTIME-INR - Abnormal; Notable for the following:    Prothrombin Time 15.4 (*)  All other components within normal limits  I-STAT CHEM 8, ED - Abnormal; Notable for the following:    Potassium 6.3 (*)    BUN 94 (*)    Creatinine, Ser 5.10 (*)    Glucose, Bld 154 (*)    Calcium, Ion 1.14 (*)    All other components within normal limits  URINE CULTURE    EKG  EKG Interpretation  Date/Time:  Monday November 11 2016 14:53:57 EDT Ventricular Rate:  51 PR Interval:    QRS Duration: 109 QT Interval:  463 QTC Calculation: 427 R Axis:   -136 Text Interpretation:  Sinus rhythm Premature ventricular complexes Nonspecific T abnormalities, lateral leads Confirmed by Virgel Manifold [phone]) on 11/11/2016 2:59:56 PM       Radiology Dg Chest 2 View  Result Date: 11/11/2016 CLINICAL DATA:  Pt from [facility], dialysis pt T/Th/sat. Pt has hx of dementia but is oriented x4 today. Per staff pt has refused dialysis Saturday and today. Pt told staff that someone told him he did not need dialysis anymore which is why he refused. EXAM: CHEST  2 VIEW COMPARISON:  Chest x-rays dated 09/11/2016 in 08/2019 2018. FINDINGS: Stable cardiomegaly. Central pulmonary vascular congestion and bilateral interstitial edema. Right pleural effusion, moderate in size. Additional opacity at the left lung base, small effusion and/or atelectasis. Dialysis catheter appears stable in position. IMPRESSION: 1. Cardiomegaly with central pulmonary vascular congestion and bilateral interstitial edema indicating CHF/volume overload. 2. Right pleural effusion, moderate in size. Suspect associated atelectasis. 3. Small effusion and/or atelectasis at the left lung base. Electronically Signed   By: Franki Cabot M.D.   On: 11/11/2016 15:23    Procedures Procedures (including critical care time)  Medications Ordered in ED Medications  sodium polystyrene (KAYEXALATE) 15 GM/60ML suspension 60 g (60 g Oral Given 11/11/16  1631)     Initial Impression / Assessment and Plan / ED Course  I have reviewed the triage vital signs and the nursing notes.  Pertinent labs & imaging results that were available during my care of the patient were reviewed by me and considered in my medical decision making (see chart for details).     66 y.o. male sent here from [facility] reportedly for AMS and needing dialysis. Pt tells me that he was sent because he was told by someone in the front office of [facility] that he doesn't need dialysis anymore, so he declined going Saturday and then again today. He denies any complaints at this time, and is A&Ox4. After speaking with Shirlean Mylar at [facility], she states he was "altered" because he refused to go to dialysis when he usually is agreeable, and she gave me the same story the pt says, which is that he reported that someone told him he didn't need to go to dialysis anymore. She states that otherwise he's at his baseline and has not had any issues/illness recently. On exam, trace b/l edema in both legs, clear lung exam, and VSS in NAD. I-stat Chem 8 revealed Cr 5.1 (baseline usually around 2), BUN 94, and K 6.3. Will get formal labs, U/A, CXR, INR, and EKG. Will reassess shortly. Discussed case with my attending Dr. Wilson Singer who agrees with plan.  4:54 PM CBC w/diff with mild stable anemia at baseline. CMP with K 6.1, mildly low bicarb 19, BUN 78, Cr 5.13; marginally elevated AST/ALT/alk phos likely from bony disease of ESRD. INR subtherapeutic at 1.23 (unclear if he's still on coumadin; at his last hospital discharge 2  months ago it was stated that he was to continue coumadin, however nursing home paperwork doesn't have this listed; unclear if he was discontinued on it or if it's just missing from the paperwork we have here). U/A showing large leuks, TNTC WBC, many bacteria, and no squamous, which is consistent with a UTI; this could explain why he is "confused" regarding his dialysis, perhaps  UTI caused this; UCx sent. EKG without acute findings. CXR with mild pulmonary vascular congestion and edema indicative of volume overload, however clinically he has no respiratory distress or s/sx of unstable volume overload. Will give kayexalate here for his hyperkalemia, and have him go to dialysis tomorrow; doubt need for urgent dialysis today. Will send home with abx for UTI (renally adjusted based on CrCl 15 calculated on today's lab values), advised that if he is on coumadin then take tonight's dose but then hold tomorrow and recheck INR, and adjust coumadin schedule per PCP's recommendations while on abx. Stay hydrated. F/up with dialysis tomorrow, and PCP in 2-3 days for recheck. Discussed case with my attending Dr. Wilson Singer who agrees with plan.  I explained the diagnosis and have given explicit precautions to return to the ER including for any other new or worsening symptoms. The patient reports that he understands and accepts the medical plan as it's been dictated and I have answered their questions. Discharge instructions concerning home care and prescriptions have been given. The patient is STABLE and is discharged to [facility] in good condition.    Final Clinical Impressions(s) / ED Diagnoses   Final diagnoses:  ESRD on dialysis ([place])  Hyperkalemia  Lower urinary tract infection  Chronic anemia  Elevated LFTs    New Prescriptions New Prescriptions   CEPHALEXIN (KEFLEX) 250 MG CAPSULE    Take 1 capsule (250 mg total) by mouth 2 (two) times daily.     [address], [place], [place] 11/11/16 1654    Virgel Manifold, MD 11/12/16 [phone]

## 2016-11-11 NOTE — ED Notes (Signed)
Patient transported to X-ray 

## 2016-11-16 LAB — URINE CULTURE

## 2016-11-17 ENCOUNTER — Telehealth: Payer: Self-pay

## 2016-11-17 NOTE — Progress Notes (Signed)
ED Antimicrobial Stewardship Positive Culture Follow Up   George Burgess is an 66 y.o. male who presented to [facility] on 11/11/2016 with a chief complaint of  Chief Complaint  Patient presents with  . Needs Dialysis    Recent Results (from the past 720 hour(s))  Urine culture     Status: Abnormal   Collection Time: 11/11/16  3:30 PM  Result Value Ref Range Status   Specimen Description URINE, RANDOM  Final   Special Requests NONE  Final   Culture (A)  Final    >=100,000 COLONIES/mL KLEBSIELLA PNEUMONIAE Confirmed Extended Spectrum Beta-Lactamase Producer (ESBL).  In bloodstream infections from ESBL organisms, carbapenems are preferred over piperacillin/tazobactam. They are shown to have a lower risk of mortality. >=100,000 COLONIES/mL ENTEROBACTER CLOACAE MULTI-DRUG RESISTANT ORGANISM    Report Status 11/16/2016 FINAL  Final   Organism ID, Bacteria KLEBSIELLA PNEUMONIAE (A)  Final   Organism ID, Bacteria ENTEROBACTER CLOACAE (A)  Final      Susceptibility   Enterobacter cloacae - MIC*    CEFAZOLIN >=64 RESISTANT Resistant     CEFTRIAXONE >=64 RESISTANT Resistant     CIPROFLOXACIN >=4 RESISTANT Resistant     GENTAMICIN 2 SENSITIVE Sensitive     IMIPENEM 1 SENSITIVE Sensitive     NITROFURANTOIN 128 RESISTANT Resistant     TRIMETH/SULFA >=320 RESISTANT Resistant     PIP/TAZO >=128 RESISTANT Resistant     * >=100,000 COLONIES/mL ENTEROBACTER CLOACAE   Klebsiella pneumoniae - MIC*    AMPICILLIN >=32 RESISTANT Resistant     CEFAZOLIN >=64 RESISTANT Resistant     CEFTRIAXONE >=64 RESISTANT Resistant     CIPROFLOXACIN >=4 RESISTANT Resistant     GENTAMICIN <=1 SENSITIVE Sensitive     IMIPENEM <=0.25 SENSITIVE Sensitive     NITROFURANTOIN 32 SENSITIVE Sensitive     TRIMETH/SULFA >=320 RESISTANT Resistant     AMPICILLIN/SULBACTAM >=32 RESISTANT Resistant     PIP/TAZO >=128 RESISTANT Resistant     Extended ESBL POSITIVE Resistant     * >=100,000 COLONIES/mL KLEBSIELLA PNEUMONIAE     [x]  Treated with cephalexin, organism resistant to prescribed antimicrobial []  Patient discharged originally without antimicrobial agent and treatment is now indicated  New antibiotic prescription: Fosfomycin 3 g q48 h x 3 doses  ED Provider: Franchot Heidelberg, PA-C  Wynell Balloon 11/17/2016, 9:19 AM Infectious Diseases Pharmacist Phone# [phone]

## 2016-11-17 NOTE — Telephone Encounter (Signed)
UC report with  recommendation for change in medication to fosfomycin 3gm q 48 hours x 3 doses and to stop Keflex, faxed to [facility] [phone]

## 2017-09-10 ENCOUNTER — Encounter (HOSPITAL_COMMUNITY): Payer: Self-pay | Admitting: Emergency Medicine

## 2017-09-10 ENCOUNTER — Observation Stay (HOSPITAL_COMMUNITY)
Admission: EM | Admit: 2017-09-10 | Discharge: 2017-09-12 | Disposition: A | Discharge status: Home / Self Care | Payer: Medicare Other | Attending: Family Medicine | Admitting: Family Medicine

## 2017-09-10 ENCOUNTER — Other Ambulatory Visit: Payer: Self-pay

## 2017-09-10 ENCOUNTER — Emergency Department (HOSPITAL_COMMUNITY): Payer: Medicare Other

## 2017-09-10 DIAGNOSIS — I5042 Chronic combined systolic (congestive) and diastolic (congestive) heart failure: Secondary | ICD-10-CM | POA: Diagnosis not present

## 2017-09-10 DIAGNOSIS — I4891 Unspecified atrial fibrillation: Secondary | ICD-10-CM | POA: Diagnosis not present

## 2017-09-10 DIAGNOSIS — Z7901 Long term (current) use of anticoagulants: Secondary | ICD-10-CM | POA: Diagnosis not present

## 2017-09-10 DIAGNOSIS — Z87891 Personal history of nicotine dependence: Secondary | ICD-10-CM | POA: Diagnosis not present

## 2017-09-10 DIAGNOSIS — F0390 Unspecified dementia without behavioral disturbance: Secondary | ICD-10-CM | POA: Insufficient documentation

## 2017-09-10 DIAGNOSIS — Z7982 Long term (current) use of aspirin: Secondary | ICD-10-CM | POA: Insufficient documentation

## 2017-09-10 DIAGNOSIS — E162 Hypoglycemia, unspecified: Secondary | ICD-10-CM | POA: Diagnosis present

## 2017-09-10 DIAGNOSIS — E1122 Type 2 diabetes mellitus with diabetic chronic kidney disease: Secondary | ICD-10-CM | POA: Diagnosis not present

## 2017-09-10 DIAGNOSIS — Z992 Dependence on renal dialysis: Secondary | ICD-10-CM

## 2017-09-10 DIAGNOSIS — R569 Unspecified convulsions: Secondary | ICD-10-CM | POA: Diagnosis not present

## 2017-09-10 DIAGNOSIS — N186 End stage renal disease: Secondary | ICD-10-CM | POA: Diagnosis not present

## 2017-09-10 DIAGNOSIS — Z79899 Other long term (current) drug therapy: Secondary | ICD-10-CM | POA: Insufficient documentation

## 2017-09-10 DIAGNOSIS — I132 Hypertensive heart and chronic kidney disease with heart failure and with stage 5 chronic kidney disease, or end stage renal disease: Secondary | ICD-10-CM | POA: Diagnosis not present

## 2017-09-10 DIAGNOSIS — R7989 Other specified abnormal findings of blood chemistry: Secondary | ICD-10-CM | POA: Diagnosis not present

## 2017-09-10 DIAGNOSIS — I10 Essential (primary) hypertension: Secondary | ICD-10-CM | POA: Diagnosis present

## 2017-09-10 DIAGNOSIS — Z794 Long term (current) use of insulin: Secondary | ICD-10-CM | POA: Diagnosis not present

## 2017-09-10 DIAGNOSIS — R55 Syncope and collapse: Secondary | ICD-10-CM | POA: Diagnosis not present

## 2017-09-10 DIAGNOSIS — N189 Chronic kidney disease, unspecified: Secondary | ICD-10-CM | POA: Diagnosis present

## 2017-09-10 DIAGNOSIS — D631 Anemia in chronic kidney disease: Secondary | ICD-10-CM | POA: Diagnosis not present

## 2017-09-10 DIAGNOSIS — H547 Unspecified visual loss: Secondary | ICD-10-CM

## 2017-09-10 DIAGNOSIS — I482 Chronic atrial fibrillation, unspecified: Secondary | ICD-10-CM | POA: Diagnosis present

## 2017-09-10 DIAGNOSIS — R778 Other specified abnormalities of plasma proteins: Secondary | ICD-10-CM | POA: Diagnosis present

## 2017-09-10 LAB — CBC WITH DIFFERENTIAL/PLATELET
ABS IMMATURE GRANULOCYTES: 0.1 10*3/uL (ref 0.0–0.1)
BASOS PCT: 1 %
Basophils Absolute: 0 10*3/uL (ref 0.0–0.1)
EOS ABS: 0.2 10*3/uL (ref 0.0–0.7)
Eosinophils Relative: 3 %
HCT: 39.1 % (ref 39.0–52.0)
Hemoglobin: 12.2 g/dL — ABNORMAL LOW (ref 13.0–17.0)
Immature Granulocytes: 1 %
Lymphocytes Relative: 14 %
Lymphs Abs: 1.2 10*3/uL (ref 0.7–4.0)
MCH: 28.2 pg (ref 26.0–34.0)
MCHC: 31.2 g/dL (ref 30.0–36.0)
MCV: 90.5 fL (ref 78.0–100.0)
MONO ABS: 0.8 10*3/uL (ref 0.1–1.0)
MONOS PCT: 10 %
NEUTROS ABS: 6.2 10*3/uL (ref 1.7–7.7)
Neutrophils Relative %: 71 %
PLATELETS: 230 10*3/uL (ref 150–400)
RBC: 4.32 MIL/uL (ref 4.22–5.81)
RDW: 17.8 % — AB (ref 11.5–15.5)
WBC: 8.6 10*3/uL (ref 4.0–10.5)

## 2017-09-10 LAB — COMPREHENSIVE METABOLIC PANEL
ALK PHOS: 153 U/L — AB (ref 38–126)
ALT: UNDETERMINED U/L (ref 0–44)
ANION GAP: 20 — AB (ref 5–15)
AST: 51 U/L — ABNORMAL HIGH (ref 15–41)
Albumin: 3.6 g/dL (ref 3.5–5.0)
BUN: 33 mg/dL — ABNORMAL HIGH (ref 8–23)
CALCIUM: 8.7 mg/dL — AB (ref 8.9–10.3)
CO2: 24 mmol/L (ref 22–32)
Chloride: 95 mmol/L — ABNORMAL LOW (ref 98–111)
Creatinine, Ser: 6.53 mg/dL — ABNORMAL HIGH (ref 0.61–1.24)
GFR calc non Af Amer: 8 mL/min — ABNORMAL LOW (ref >60)
GFR, EST AFRICAN AMERICAN: 9 mL/min — AB (ref >60)
GLUCOSE: 68 mg/dL — AB (ref 70–99)
Potassium: 4.4 mmol/L (ref 3.5–5.1)
SODIUM: 139 mmol/L (ref 135–145)
TOTAL PROTEIN: 7.6 g/dL (ref 6.5–8.1)
Total Bilirubin: UNDETERMINED mg/dL (ref 0.3–1.2)

## 2017-09-10 LAB — CBG MONITORING, ED
GLUCOSE-CAPILLARY: 65 mg/dL — AB (ref 70–99)
Glucose-Capillary: 54 mg/dL — ABNORMAL LOW (ref 70–99)
Glucose-Capillary: 57 mg/dL — ABNORMAL LOW (ref 70–99)

## 2017-09-10 LAB — TROPONIN I: Troponin I: 0.05 ng/mL (ref <0.03)

## 2017-09-10 LAB — ETHANOL

## 2017-09-10 LAB — PROTIME-INR
INR: 1.14
Prothrombin Time: 14.5 seconds (ref 11.4–15.2)

## 2017-09-10 MED ORDER — DEXTROSE 50 % IV SOLN
1.0000 | Freq: Once | INTRAVENOUS | Status: AC
  Administered 2017-09-11 (×2): 50 mL via INTRAVENOUS
  Filled 2017-09-10: qty 50

## 2017-09-10 MED ORDER — LORAZEPAM 2 MG/ML IJ SOLN
INTRAMUSCULAR | Status: AC
  Administered 2017-09-10: 2 mg via INTRAMUSCULAR
  Filled 2017-09-10: qty 1

## 2017-09-10 MED ORDER — SODIUM CHLORIDE 0.9 % IV SOLN
INTRAVENOUS | Status: DC
  Administered 2017-09-10: 22:00:00 via INTRAVENOUS

## 2017-09-10 MED ORDER — GLUCOSE 40 % PO GEL
1.0000 | Freq: Once | ORAL | Status: AC
  Administered 2017-09-10: 37.5 g via ORAL
  Filled 2017-09-10: qty 1

## 2017-09-10 MED ORDER — GLUCOSE 40 % PO GEL
1.0000 | Freq: Once | ORAL | Status: AC
  Administered 2017-09-10: 37.5 g via ORAL
  Administered 2017-09-11: 1 via ORAL
  Filled 2017-09-10: qty 1

## 2017-09-10 NOTE — ED Notes (Signed)
Pt CBG was 65, notified Julie(RN)

## 2017-09-10 NOTE — ED Notes (Signed)
Witnessed seizure that lasted about a minute and half. The pt told this RN his stomach started to hurt and pt then started to shake his entire body and stiffen up. The pt had a fixed gaze to the left during this seizure. PA notified and came to bedside. Ativan 2mg  given. After seizure pt did not present post ictal. Pt was alert to staff.

## 2017-09-10 NOTE — ED Provider Notes (Signed)
Patient signed out to me by Dr. Tomi Bamberger.  Waiting for complete metabolic panel prior to admission phone call for syncope.  Patient had syncopal episode at dinner tonight.  I suspect that this is likely hypoglycemia given that his glucose is 65 here.  He was given oral glucose and did not have any improvement.  We will give 1 amp of D50.  Patient is alert and oriented.  He is sitting up in bed speaking to me clearly.  He moves all of his extremities.  He is not having any chest pain or shortness of breath.  Doubt ACS.  He does have a mildly elevated troponin at 0.05, but this is likely chronic given his end-stage renal disease and being on dialysis.  Discussed with Dr. Hal Hope, who will admit the patient.   Montine Circle, PA-C 09/10/17 2317    Dorie Rank, MD 09/11/17 [phone]

## 2017-09-10 NOTE — ED Provider Notes (Signed)
[facility] EMERGENCY DEPARTMENT Provider Note   CSN: [id number] Arrival date & time:        History   Chief Complaint Chief Complaint  Patient presents with  . Seizures    HPI George Burgess is a 67 y.o. male.  HPI Patient presents to the emergency room for evaluation of a syncopal event versus a seizure.  Patient is currently residing at a nursing facility.  Patient states he remembers sitting at the dinner table eating.  Next thing he remembers is blanking out.  Patient states he must have dropped his food he remembers waking up at the dinner table.  He denied having any prodrome.  He denies any trouble with any headache.  No chest pain or shortness of breath.  According to the EMS report I saw the patient just staring off for a minute or so not responding.  He may have had some mild trembling. Past Medical History:  Diagnosis Date  . Atrial fibrillation ([place])    a. Chronic Eliquis (CHA2DS2VASc = 6).  . Chronic combined systolic (congestive) and diastolic (congestive) heart failure ([place])    a. Previously reported EF of 30%;  b. 08/2016 Echo: EF 40-45%, antsept, inf, infsept HK, Gr3 DD, mod AI/MR, sev dil LA, mild TR, PASP 48mmHg.  Marland Kitchen Dementia   . Encephalopathy   . End stage renal disease ([place])    a. On HD.  Marland Kitchen Essential hypertension   . GIB (gastrointestinal bleeding)    a. 08/2016 in setting of Eliquis Rx.  . Pleural effusion   . PVD (peripheral vascular disease) ([place])    a. s/p L BKA;  b. Chronic LE wounds.  . Stroke ([place])   . Type II diabetes mellitus [facility])     Patient Active Problem List   Diagnosis Date Noted  . Blindness   . Bloody stools   . Encounter for nasogastric (NG) tube placement   . Lower GI bleed   . Malnutrition of moderate degree 09/05/2016  . Acute GI bleeding 09/04/2016    Past Surgical History:  Procedure Laterality Date  . BELOW KNEE LEG AMPUTATION Left   . FLEXIBLE SIGMOIDOSCOPY N/A 09/07/2016   Procedure: FLEXIBLE  SIGMOIDOSCOPY;  Surgeon: Manus Gunning, MD;  Location: [facility];  Service: Gastroenterology;  Laterality: N/A;        Home Medications    Prior to Admission medications   Medication Sig Start Date End Date Taking? Authorizing Provider  acetaminophen (TYLENOL) 325 MG tablet Take 650 mg by mouth every 4 (four) hours as needed for moderate pain.    [provider]  ascorbic acid (VITAMIN C) 500 MG tablet Take 500 mg by mouth daily.    [provider]  aspirin 81 MG tablet Chew 81 mg by mouth daily.    [provider]  atorvastatin (LIPITOR) 80 MG tablet Take 1 tablet (80 mg total) by mouth daily at 6 PM. 09/13/16   Cristal Ford, DO  famotidine (PEPCID) 20 MG tablet Take 20 mg by mouth daily.    [provider]  ferrous sulfate 325 (65 FE) MG tablet Take 325 mg by mouth 2 (two) times daily with a meal.    [provider]  insulin lispro (HUMALOG) 100 UNIT/ML injection Inject 0-10 Units into the skin 3 (three) times daily before meals. 70-150 units=0 units,  151-200=200 units,  201-250=4 units,  251-300=6 units, 301-350= 8 units, 351 += 10 units, 400 or higher CALL MD    [provider]  isosorbide mononitrate (IMDUR) 30 MG 24 hr tablet Take 30 mg by mouth daily.    [provider]  lamoTRIgine (LAMICTAL) 25 MG tablet Take 25 mg by mouth at bedtime.     [provider]  metoprolol tartrate (LOPRESSOR) 50 MG tablet Take 50 mg by mouth 2 (two) times daily.    [provider]  multivitamin (RENA-VIT) TABS tablet Take 1 tablet by mouth daily.    [provider]  nitroGLYCERIN (NITROSTAT) 0.4 MG SL tablet Place 0.4 mg under the tongue every 5 (five) minutes as needed for chest pain.    [provider]  Nutritional Supplements (FEEDING SUPPLEMENT, NEPRO CARB STEADY,) LIQD Take 237 mLs by mouth daily.    [provider]  pantoprazole (PROTONIX) 40 MG tablet Take 40 mg by mouth  daily.    [provider]  warfarin (COUMADIN) 5 MG tablet Take 1 tablet (5 mg total) by mouth one time only at 6 PM. Patient not taking: Reported on 11/11/2016 09/13/16   Cristal Ford, DO    Family History Family History  Problem Relation Age of Onset  . Other Mother        Pt unsure of PMH of family members.    Social History Social History   Tobacco Use  . Smoking status: Former Research scientist (life sciences)  . Smokeless tobacco: Never Used  . Tobacco comment: Pt thinks he used to smoke cigarettes.  Thinks he quit many years ago.  Substance Use Topics  . Alcohol use: No  . Drug use: No     Allergies   Patient has no known allergies.   Review of Systems Review of Systems  All other systems reviewed and are negative.    Physical Exam Updated Vital Signs BP 129/62   Pulse 60   Temp 97.8 F (36.6 C) (Oral)   Resp 20   Ht 1.829 m (6')   Wt 79.4 kg (175 lb)   SpO2 99%   BMI 23.73 kg/m   Physical Exam  Constitutional: No distress.  HENT:  Head: Normocephalic and atraumatic.  Right Ear: External ear normal.  Left Ear: External ear normal.  No evidence of tongue injury  Eyes: Conjunctivae are normal. Right eye exhibits no discharge. Left eye exhibits no discharge. No scleral icterus.  Neck: Neck supple. No tracheal deviation present.  Cardiovascular: Normal rate, regular rhythm and intact distal pulses.  Pulmonary/Chest: Effort normal and breath sounds normal. No stridor. No respiratory distress. He has no wheezes. He has no rales.  Abdominal: Soft. Bowel sounds are normal. He exhibits no distension. There is no tenderness. There is no rebound and no guarding.  Musculoskeletal: He exhibits no edema or tenderness.  Status post BKA left lower extremity, clean dressing noted on the right foot  Neurological: He is alert. He has normal strength. No cranial nerve deficit (no facial droop, extraocular movements intact, no slurred speech) or sensory deficit. He exhibits normal  muscle tone. He displays no seizure activity. Coordination normal.  Skin: Skin is warm. No rash noted. He is not diaphoretic.  Psychiatric: He has a normal mood and affect.  Nursing note and vitals reviewed.    ED Treatments / Results  Labs (all labs ordered are listed, but only abnormal results are displayed) Labs Reviewed  CBG MONITORING, ED - Abnormal; Notable for the following components:      Result Value   Glucose-Capillary 65 (*)    All other components within normal limits  COMPREHENSIVE METABOLIC  PANEL  CBC WITH DIFFERENTIAL/PLATELET  ETHANOL  TROPONIN I    EKG EKG Interpretation  Date/Time:  Wednesday September 10 2017 19:34:53 EDT Ventricular Rate:  80 PR Interval:    QRS Duration: 111 QT Interval:  409 QTC Calculation: 472 R Axis:   -104 Text Interpretation:  Second degree AV block, Mobitz II , new since last tracing Ventricular premature complex , new since last tracing Incomplete RBBB and LAFB Confirmed by Dorie Rank [phone]) on 09/10/2017 7:49:35 PM   Radiology Dg Chest 2 View  Result Date: 09/10/2017 CLINICAL DATA:  Syncope. EXAM: CHEST - 2 VIEW COMPARISON:  Radiographs of November 11, 2016. FINDINGS: Stable cardiomegaly. Both lungs are clear. No pneumothorax or pleural effusion is noted. The visualized skeletal structures are unremarkable. IMPRESSION: No active cardiopulmonary disease. Electronically Signed   By: Marijo Conception, M.D.   On: 09/10/2017 20:38   Ct Head Wo Contrast  Result Date: 09/10/2017 CLINICAL DATA:  Initial evaluation for seizure. EXAM: CT HEAD WITHOUT CONTRAST TECHNIQUE: Contiguous axial images were obtained from the base of the skull through the vertex without intravenous contrast. COMPARISON:  Prior CT from 09/07/2016. FINDINGS: Brain: Moderately advanced cerebral atrophy with chronic small vessel ischemic disease, progressed relative to previous. Remote lacunar infarct present at the left caudate. Remote bilateral parietooccipital  infarctions, left greater than right with associated encephalomalacia and gliosis. 3.2 cm meningioma overlies the right frontal convexity without significant mass effect, similar to previous. No acute intracranial hemorrhage. No acute large vessel territory infarct. No other mass lesion. No mass effect or midline shift. No hydrocephalus. No extra-axial fluid collection. Vascular: No hyperdense vessel. Calcified atherosclerosis at the skull base. Skull: Scalp soft tissues and calvarium within normal limits. Sinuses/Orbits: Globes and orbital soft tissues within normal limits. Paranasal sinuses are clear. No mastoid effusion. Other: None. IMPRESSION: 1. No acute intracranial abnormality. 2. Chronic bilateral parieto-occipital infarcts, left greater than right. 3. 3.2 cm meningioma overlying the right frontal convexity without associated mass effect. 4. Moderately advanced cerebral atrophy with chronic small vessel ischemic disease, mildly progressed from previous. Electronically Signed   By: Jeannine Boga M.D.   On: 09/10/2017 20:37    Procedures Procedures (including critical care time)  Medications Ordered in ED Medications  0.9 %  sodium chloride infusion (has no administration in time range)     Initial Impression / Assessment and Plan / ED Course  I have reviewed the triage vital signs and the nursing notes.  Pertinent labs & imaging results that were available during my care of the patient were reviewed by me and considered in my medical decision making (see chart for details).    Patient presented to the emergency room for evaluation of possible syncopal episode versus seizure.  Patient has remained stable in the emergency room.  CT scan without acute findings.  Labs are currently pending.  Would anticipate admission for overnight observation, cardiac monitoring, considering his multiple comorbidities.  Case will be turned over to oncoming provider  Final Clinical Impressions(s) / ED  Diagnoses  Syncope   Dorie Rank, MD 09/10/17 2152

## 2017-09-10 NOTE — ED Triage Notes (Signed)
Pt arrives to ED from [facility] and rehab with complaints of seizure like activity reported since dinner tonight. EMS reports facility noticed pt at dinner drop his food and start to have tremors, then the pt was noted to have a blank stare lasting for about a minute to minute and half. No LOC reported. Pt alert and oriented. Pt has no SOB/CP/NVD. Pt placed in position of comfort with bed locked and lowered, call bell in reach.

## 2017-09-11 ENCOUNTER — Other Ambulatory Visit: Payer: Self-pay

## 2017-09-11 ENCOUNTER — Observation Stay (HOSPITAL_COMMUNITY): Payer: Medicare Other

## 2017-09-11 ENCOUNTER — Encounter (HOSPITAL_COMMUNITY): Payer: Self-pay | Admitting: Internal Medicine

## 2017-09-11 DIAGNOSIS — N186 End stage renal disease: Secondary | ICD-10-CM

## 2017-09-11 DIAGNOSIS — R748 Abnormal levels of other serum enzymes: Secondary | ICD-10-CM

## 2017-09-11 DIAGNOSIS — D631 Anemia in chronic kidney disease: Secondary | ICD-10-CM | POA: Diagnosis present

## 2017-09-11 DIAGNOSIS — N189 Chronic kidney disease, unspecified: Secondary | ICD-10-CM | POA: Diagnosis present

## 2017-09-11 DIAGNOSIS — H547 Unspecified visual loss: Secondary | ICD-10-CM

## 2017-09-11 DIAGNOSIS — R569 Unspecified convulsions: Secondary | ICD-10-CM | POA: Diagnosis not present

## 2017-09-11 DIAGNOSIS — I10 Essential (primary) hypertension: Secondary | ICD-10-CM | POA: Diagnosis present

## 2017-09-11 DIAGNOSIS — I482 Chronic atrial fibrillation, unspecified: Secondary | ICD-10-CM | POA: Diagnosis present

## 2017-09-11 DIAGNOSIS — I132 Hypertensive heart and chronic kidney disease with heart failure and with stage 5 chronic kidney disease, or end stage renal disease: Secondary | ICD-10-CM | POA: Diagnosis not present

## 2017-09-11 DIAGNOSIS — E162 Hypoglycemia, unspecified: Secondary | ICD-10-CM | POA: Diagnosis not present

## 2017-09-11 DIAGNOSIS — R55 Syncope and collapse: Secondary | ICD-10-CM

## 2017-09-11 DIAGNOSIS — R7989 Other specified abnormal findings of blood chemistry: Secondary | ICD-10-CM

## 2017-09-11 DIAGNOSIS — E1122 Type 2 diabetes mellitus with diabetic chronic kidney disease: Secondary | ICD-10-CM | POA: Diagnosis not present

## 2017-09-11 DIAGNOSIS — Z992 Dependence on renal dialysis: Secondary | ICD-10-CM

## 2017-09-11 DIAGNOSIS — R778 Other specified abnormalities of plasma proteins: Secondary | ICD-10-CM | POA: Diagnosis present

## 2017-09-11 LAB — GLUCOSE, CAPILLARY
GLUCOSE-CAPILLARY: 109 mg/dL — AB (ref 70–99)
GLUCOSE-CAPILLARY: 55 mg/dL — AB (ref 70–99)
GLUCOSE-CAPILLARY: 82 mg/dL (ref 70–99)
GLUCOSE-CAPILLARY: 57 mg/dL — AB (ref 70–99)
GLUCOSE-CAPILLARY: 82 mg/dL (ref 70–99)
GLUCOSE-CAPILLARY: 80 mg/dL (ref 70–99)
GLUCOSE-CAPILLARY: 125 mg/dL — AB (ref 70–99)
GLUCOSE-CAPILLARY: 91 mg/dL (ref 70–99)
GLUCOSE-CAPILLARY: 45 mg/dL — AB (ref 70–99)
GLUCOSE-CAPILLARY: 111 mg/dL — AB (ref 70–99)
Glucose-Capillary: 39 mg/dL — CL (ref 70–99)
Glucose-Capillary: 36 mg/dL — CL (ref 70–99)
Glucose-Capillary: 43 mg/dL — CL (ref 70–99)
Glucose-Capillary: 48 mg/dL — ABNORMAL LOW (ref 70–99)
Glucose-Capillary: 76 mg/dL (ref 70–99)
Glucose-Capillary: 107 mg/dL — ABNORMAL HIGH (ref 70–99)
Glucose-Capillary: 96 mg/dL (ref 70–99)
Glucose-Capillary: 42 mg/dL — CL (ref 70–99)
Glucose-Capillary: 163 mg/dL — ABNORMAL HIGH (ref 70–99)
Glucose-Capillary: 152 mg/dL — ABNORMAL HIGH (ref 70–99)

## 2017-09-11 LAB — CBG MONITORING, ED
Glucose-Capillary: 118 mg/dL — ABNORMAL HIGH (ref 70–99)
Glucose-Capillary: 128 mg/dL — ABNORMAL HIGH (ref 70–99)

## 2017-09-11 LAB — CBC
HCT: 36.3 % — ABNORMAL LOW (ref 39.0–52.0)
Hemoglobin: 10.9 g/dL — ABNORMAL LOW (ref 13.0–17.0)
MCH: 27.6 pg (ref 26.0–34.0)
MCHC: 30 g/dL (ref 30.0–36.0)
MCV: 91.9 fL (ref 78.0–100.0)
PLATELETS: 226 10*3/uL (ref 150–400)
RBC: 3.95 MIL/uL — ABNORMAL LOW (ref 4.22–5.81)
RDW: 18.1 % — AB (ref 11.5–15.5)
WBC: 7.8 10*3/uL (ref 4.0–10.5)

## 2017-09-11 LAB — HEPATIC FUNCTION PANEL
ALBUMIN: 3.6 g/dL (ref 3.5–5.0)
ALT: 37 U/L (ref 0–44)
AST: 43 U/L — AB (ref 15–41)
Alkaline Phosphatase: 143 U/L — ABNORMAL HIGH (ref 38–126)
BILIRUBIN DIRECT: 0.2 mg/dL (ref 0.0–0.2)
Indirect Bilirubin: 0.6 mg/dL (ref 0.3–0.9)
TOTAL PROTEIN: 7.4 g/dL (ref 6.5–8.1)
Total Bilirubin: 0.8 mg/dL (ref 0.3–1.2)

## 2017-09-11 LAB — HEMOGLOBIN A1C
HEMOGLOBIN A1C: 7.2 % — AB (ref 4.8–5.6)
MEAN PLASMA GLUCOSE: 159.94 mg/dL

## 2017-09-11 LAB — BASIC METABOLIC PANEL
Anion gap: 16 — ABNORMAL HIGH (ref 5–15)
BUN: 38 mg/dL — AB (ref 8–23)
CALCIUM: 8.4 mg/dL — AB (ref 8.9–10.3)
CO2: 25 mmol/L (ref 22–32)
Chloride: 98 mmol/L (ref 98–111)
Creatinine, Ser: 7.22 mg/dL — ABNORMAL HIGH (ref 0.61–1.24)
GFR calc Af Amer: 8 mL/min — ABNORMAL LOW (ref >60)
GFR, EST NON AFRICAN AMERICAN: 7 mL/min — AB (ref >60)
Glucose, Bld: 96 mg/dL (ref 70–99)
Potassium: 4.1 mmol/L (ref 3.5–5.1)
SODIUM: 139 mmol/L (ref 135–145)

## 2017-09-11 LAB — CREATININE, SERUM
Creatinine, Ser: 7.19 mg/dL — ABNORMAL HIGH (ref 0.61–1.24)
GFR calc non Af Amer: 7 mL/min — ABNORMAL LOW (ref >60)
GFR, EST AFRICAN AMERICAN: 8 mL/min — AB (ref >60)

## 2017-09-11 LAB — TROPONIN I
TROPONIN I: 0.08 ng/mL — AB (ref <0.03)
TROPONIN I: 0.14 ng/mL — AB (ref <0.03)
Troponin I: 0.15 ng/mL (ref <0.03)

## 2017-09-11 LAB — TSH: TSH: 1.551 u[IU]/mL (ref 0.350–4.500)

## 2017-09-11 LAB — HIV ANTIBODY (ROUTINE TESTING W REFLEX): HIV SCREEN 4TH GENERATION: NONREACTIVE

## 2017-09-11 LAB — MRSA PCR SCREENING: MRSA by PCR: POSITIVE — AB

## 2017-09-11 LAB — MAGNESIUM: Magnesium: 2.1 mg/dL (ref 1.7–2.4)

## 2017-09-11 LAB — PHOSPHORUS: Phosphorus: 6.1 mg/dL — ABNORMAL HIGH (ref 2.5–4.6)

## 2017-09-11 MED ORDER — ISOSORBIDE MONONITRATE ER 30 MG PO TB24
30.0000 mg | ORAL_TABLET | Freq: Every evening | ORAL | Status: DC
  Administered 2017-09-11: 30 mg via ORAL
  Filled 2017-09-11: qty 1

## 2017-09-11 MED ORDER — NITROGLYCERIN 0.4 MG SL SUBL: 0.4000 mg | SUBLINGUAL_TABLET | SUBLINGUAL | Status: DC | PRN

## 2017-09-11 MED ORDER — RENA-VITE PO TABS
1.0000 | ORAL_TABLET | Freq: Every day | ORAL | Status: DC
  Administered 2017-09-11 – 2017-09-12 (×2): 1 via ORAL
  Filled 2017-09-11 (×2): qty 1

## 2017-09-11 MED ORDER — SODIUM CHLORIDE 0.9 % IV SOLN
500.0000 mg | INTRAVENOUS | Status: DC
  Administered 2017-09-11: 500 mg via INTRAVENOUS
  Filled 2017-09-11 (×2): qty 0.5

## 2017-09-11 MED ORDER — PENTAFLUOROPROP-TETRAFLUOROETH EX AERO: 1.0000 "application " | INHALATION_SPRAY | CUTANEOUS | Status: DC | PRN

## 2017-09-11 MED ORDER — SODIUM CHLORIDE 0.9 % IV SOLN: 100.0000 mL | INTRAVENOUS | Status: DC | PRN

## 2017-09-11 MED ORDER — ONDANSETRON HCL 4 MG PO TABS: 4.0000 mg | ORAL_TABLET | Freq: Four times a day (QID) | ORAL | Status: DC | PRN

## 2017-09-11 MED ORDER — GLUCOSE 40 % PO GEL
ORAL | Status: AC
  Administered 2017-09-11: 1 via ORAL
  Filled 2017-09-11: qty 1

## 2017-09-11 MED ORDER — NEPRO/CARBSTEADY PO LIQD
237.0000 mL | Freq: Two times a day (BID) | ORAL | Status: DC
  Administered 2017-09-11 – 2017-09-12 (×3): 237 mL via ORAL
  Filled 2017-09-11 (×4): qty 237

## 2017-09-11 MED ORDER — DEXTROSE 50 % IV SOLN: 1.0000 | Freq: Once | INTRAVENOUS | Status: AC

## 2017-09-11 MED ORDER — ONDANSETRON HCL 4 MG/2ML IJ SOLN: 4.0000 mg | Freq: Four times a day (QID) | INTRAMUSCULAR | Status: DC | PRN

## 2017-09-11 MED ORDER — GLUCOSE 4 G PO CHEW
CHEWABLE_TABLET | ORAL | Status: AC
  Administered 2017-09-11: 2
  Filled 2017-09-11: qty 1

## 2017-09-11 MED ORDER — DEXTROSE 50 % IV SOLN
INTRAVENOUS | Status: AC
  Administered 2017-09-11: 20 mL
  Filled 2017-09-11: qty 50

## 2017-09-11 MED ORDER — ASPIRIN 81 MG PO CHEW
81.0000 mg | CHEWABLE_TABLET | Freq: Every day | ORAL | Status: DC
  Administered 2017-09-11 – 2017-09-12 (×2): 81 mg via ORAL
  Filled 2017-09-11 (×2): qty 1

## 2017-09-11 MED ORDER — ACETAMINOPHEN 325 MG PO TABS: 650.0000 mg | ORAL_TABLET | Freq: Four times a day (QID) | ORAL | Status: DC | PRN

## 2017-09-11 MED ORDER — DOXERCALCIFEROL 4 MCG/2ML IV SOLN: 2.5000 ug | INTRAVENOUS | Status: DC

## 2017-09-11 MED ORDER — DEXTROSE 50 % IV SOLN
INTRAVENOUS | Status: AC
  Administered 2017-09-11: 50 mL via INTRAVENOUS
  Filled 2017-09-11: qty 50

## 2017-09-11 MED ORDER — GLUCOSE 40 % PO GEL: 1.0000 | Freq: Once | ORAL | Status: AC

## 2017-09-11 MED ORDER — CHLORHEXIDINE GLUCONATE CLOTH 2 % EX PADS
6.0000 | MEDICATED_PAD | Freq: Every day | CUTANEOUS | Status: DC
  Administered 2017-09-12: 6 via TOPICAL

## 2017-09-11 MED ORDER — LAMOTRIGINE 25 MG PO TABS
25.0000 mg | ORAL_TABLET | Freq: Every day | ORAL | Status: DC
  Administered 2017-09-11 (×2): 25 mg via ORAL
  Filled 2017-09-11 (×3): qty 1

## 2017-09-11 MED ORDER — HEPARIN SODIUM (PORCINE) 1000 UNIT/ML DIALYSIS: 3800.0000 [IU] | INTRAMUSCULAR | Status: DC | PRN

## 2017-09-11 MED ORDER — LIDOCAINE-PRILOCAINE 2.5-2.5 % EX CREA: 1.0000 "application " | TOPICAL_CREAM | CUTANEOUS | Status: DC | PRN

## 2017-09-11 MED ORDER — METOPROLOL TARTRATE 25 MG PO TABS
25.0000 mg | ORAL_TABLET | Freq: Two times a day (BID) | ORAL | Status: DC
  Administered 2017-09-11 – 2017-09-12 (×3): 25 mg via ORAL
  Filled 2017-09-11 (×4): qty 1

## 2017-09-11 MED ORDER — ERTAPENEM IV (FOR PTA / DISCHARGE USE ONLY): 500.0000 mg | INTRAVENOUS | Status: DC

## 2017-09-11 MED ORDER — DEXTROSE 50 % IV SOLN
INTRAVENOUS | Status: AC
  Filled 2017-09-11: qty 50

## 2017-09-11 MED ORDER — HEPARIN SODIUM (PORCINE) 5000 UNIT/ML IJ SOLN
5000.0000 [IU] | Freq: Three times a day (TID) | INTRAMUSCULAR | Status: DC
  Administered 2017-09-11 – 2017-09-12 (×3): 5000 [IU] via SUBCUTANEOUS
  Filled 2017-09-11 (×3): qty 1

## 2017-09-11 MED ORDER — ACETAMINOPHEN 650 MG RE SUPP: 650.0000 mg | Freq: Four times a day (QID) | RECTAL | Status: DC | PRN

## 2017-09-11 MED ORDER — GLUCOSE 40 % PO GEL
ORAL | Status: AC
  Filled 2017-09-11: qty 1

## 2017-09-11 MED ORDER — ATORVASTATIN CALCIUM 80 MG PO TABS
80.0000 mg | ORAL_TABLET | Freq: Every day | ORAL | Status: DC
  Administered 2017-09-11: 80 mg via ORAL
  Filled 2017-09-11: qty 1

## 2017-09-11 MED ORDER — LIDOCAINE HCL (PF) 1 % IJ SOLN
5.0000 mL | INTRAMUSCULAR | Status: DC | PRN
Start: 2017-09-11 — End: 2017-09-11

## 2017-09-11 NOTE — Progress Notes (Signed)
Patients 2100 CBG was 111 will doc meter after 2200 CBG. Gave patient soup (chicken noodle) [place] sandwich and apple sauce.

## 2017-09-11 NOTE — Progress Notes (Signed)
Hypoglycemic Event  CBG: 36 mg/dL  Treatment: 4mg  glucose tab x 2 + 8oz orange juice  Symptoms: Asymptomatic, VSS, NAD noted   Follow-up CBG: Time: 0930 CBG Result:48 mg/dL  Comments/MD notified:Dr. Cruzita Lederer w/new orders recieved    Trace Wirick Alen Bleacher, RN

## 2017-09-11 NOTE — ED Notes (Signed)
Pt brother Will, [phone]

## 2017-09-11 NOTE — Progress Notes (Signed)
Hemodialysis treatment summary 4584 - trmt initiated 1439 - CBG 107 1530 - CBG 96 1630 - CBG 91 1730 - CBG 94 1838 - trmt completed. 1905 - CBG 45, pt asymptomatic.  Given 1 tube glucose gel and 4 ounces apple juice with 2 packs sugar dissolved. 1925 - CBG 42.  Paged Dr. Hal Hope, new orders received. 1 amp D 50 given IVP.   61 - Report called to Azell Der, RN.  After next CBG result, will transport patient back to unit with RN transport, he is agreeable. 1945 - CBG 163 2000 - Transported patient to room [phone] with transporter assistance.  Care of patient transferred to Nona Dell, RN.

## 2017-09-11 NOTE — Progress Notes (Signed)
EEG completed; results pending.    

## 2017-09-11 NOTE — ED Notes (Signed)
Pt CBG was 118, notified Julie(RN)

## 2017-09-11 NOTE — Progress Notes (Signed)
This RN received call from dialysis  Informing me that patient blood sugar is in the forties and not responding to hypoglycemic protocol and care order instructions for this patient. She wants to transport this patient back to this floor regardless of CBG and that she will accompany patient. I am awaiting his return.  Patients CBG 162 upon arrival back from Hemodialysis, will continue to monitor.

## 2017-09-11 NOTE — Procedures (Signed)
ELECTROENCEPHALOGRAM REPORT   Patient: George Burgess       Room #: 1E07H EEG No. ID: [id number] Age: 67 y.o.        Sex: male Referring Physician: Cruzita Lederer Report Date:  09/11/2017        Interpreting Physician: Alexis Goodell  History: [name] is an 67 y.o. male with syncope evaluated to rule out seizure  Medications:  ASA, Lipitor, Imdur, Lamictal, Merrem, Lopressor, MVI  Conditions of Recording:  This is a 21 channel routine scalp EEG performed with bipolar and monopolar montages arranged in accordance to the international 10/20 system of electrode placement. One channel was dedicated to EKG recording.  The patient is in the awake and drowsy states.  Description:  The waking background activity consists of a low voltage, symmetrical, fairly well organized, 9-10 Hz alpha activity, seen from the parieto-occipital and posterior temporal regions.  Low voltage fast activity, poorly organized, is seen anteriorly and is at times superimposed on more posterior regions.  A mixture of theta and alpha rhythms are seen from the central and temporal regions. The patient drowses with slowing to irregular, low voltage theta and beta activity.   Stage II sleep is not obtained. No epileptiform activity is noted.   Hyperventilation and intermittent photic stimulation were not performed.   IMPRESSION: This is a normal awake and drowsy electroencephalogram.  No epileptiform activity is noted.     Alexis Goodell, MD Neurology [phone] 09/11/2017, 11:56 AM

## 2017-09-11 NOTE — Plan of Care (Signed)
Patient stable, discussed POC with patient, denies question/concerns at this time.

## 2017-09-11 NOTE — Progress Notes (Signed)
Patient seen and examined this morning, admitted overnight by Dr. Ronnette Juniper, H&P reviewed and agree with assessment and plan.  In brief, this is a 67 year old male with history of end-stage renal disease on HD, chronic combined systolic and diastolic CHF, diabetes mellitus, A. fib, peripheral vascular disease status post left BKA, who was admitted to the hospital with a syncopal episode and he is SNF as well as a grand mal seizure in the ED.  He was also found to be hypoglycemic.  Neurology was consulted and recommended MRI, EEG but no antiseizure medications for now.   Seizures, possibly hypoglycemic seizures -Neurology consulted, Dr. Raliegh Ip discussed with Dr. Leonel Ramsay, obtain MRI of the brain, EEGs hypoglycemia  DM -Hold home regimen, check A1c and closely follow CBGs  End-stage renal disease -Consulted nephrology  Chronic A. fib  -rate controlled on metoprolol, not on anticoagulation but unclear why, patient cannot contribute much to the story today and has some postictal features, suspect following his GI bleed hospitalization a year ago in July 2018 when his Eliquis was stopped.  Probably never resumed  Right foot wound -Chronic, wound looks well without any signs of active infection.  Wound care to see.  He is on Carbapenem for the past week I am not sure if it has for the wound versus an ESBL UTI.  Continue Carbapenem for here, per pharmacy he is supposed to have the last dose on 7/27  Coronary artery disease -No chest pain, continue home medications   Costin M. Cruzita Lederer, MD [facility] [phone]  If 7PM-7AM, please contact night-coverage [email or web address] Password [email or web address]

## 2017-09-11 NOTE — ED Notes (Signed)
Patient transferred to hospital bed, resting well.

## 2017-09-11 NOTE — H&P (Signed)
History and Physical    George Burgess ZOX:[id number] DOB: Oct 03, 1950 DOA: 09/10/2017  PCP: Caprice Renshaw, MD  Patient coming from: [facility].  Chief Complaint: Loss of consciousness.  HPI: George Burgess is a 67 y.o. male with history of ESRD on hemodialysis, chronic CHF last EF measured in July 2018 was 7 to 45%, diabetes mellitus type 2, atrial fibrillation, peripheral vascular disease status post left BKA, anemia, dementia, history of stroke was brought to the ER after patient had a syncopal episode while having his dinner.  Patient states that he remembers having the denies the next thing he remembers is people around him.  He states he may have lost consciousness for a few seconds.  Denies any incontinence of urine or tongue bite.  Denies any chest pain or shortness of breath nausea vomiting headache diarrhea.  At the facility patient's blood sugar was found to be around 65.  ED Course: In the ER patient's blood sugar consistently remained around 50s despite giving oral glucose.  CT head was unremarkable.  EKG was showing A. fib (computer was reading as second-degree AV block which I confirmed with cardiologist Dr. Clayton Lions to be in A. fib rate controlled).  Patient was planned to be admitted for syncope work-up when patient suddenly started having tonic-clonic seizure at that time patient blood sugar around 55.  Patient was given 2 mg of IV Ativan.  Patient was postictal and mildly confused.  But moves all extremities.  Discussed with neurologist on-call who advised to get MRI brain and EEG.  Review of Systems: As per HPI, rest all negative.   Past Medical History:  Diagnosis Date  . Atrial fibrillation ([place])    a. Chronic Eliquis (CHA2DS2VASc = 6).  . Chronic combined systolic (congestive) and diastolic (congestive) heart failure ([place])    a. Previously reported EF of 30%;  b. 08/2016 Echo: EF 40-45%, antsept, inf, infsept HK, Gr3 DD, mod AI/MR, sev dil LA, mild TR,  PASP 50mmHg.  Marland Kitchen Dementia   . Encephalopathy   . End stage renal disease ([place])    a. On HD.  Marland Kitchen Essential hypertension   . GIB (gastrointestinal bleeding)    a. 08/2016 in setting of Eliquis Rx.  . Pleural effusion   . PVD (peripheral vascular disease) ([place])    a. s/p L BKA;  b. Chronic LE wounds.  . Stroke ([place])   . Type II diabetes mellitus ([place])     Past Surgical History:  Procedure Laterality Date  . BELOW KNEE LEG AMPUTATION Left   . FLEXIBLE SIGMOIDOSCOPY N/A 09/07/2016   Procedure: FLEXIBLE SIGMOIDOSCOPY;  Surgeon: Manus Gunning, MD;  Location: [facility];  Service: Gastroenterology;  Laterality: N/A;     reports that he has quit smoking. He has never used smokeless tobacco. He reports that he does not drink alcohol or use drugs.  No Known Allergies  Family History  Problem Relation Age of Onset  . Other Mother        Pt unsure of PMH of family members.    Prior to Admission medications   Medication Sig Start Date End Date Taking? Authorizing Provider  acetaminophen (TYLENOL) 325 MG tablet Take 650 mg by mouth every 4 (four) hours as needed for moderate pain.   Yes [provider]  ascorbic acid (VITAMIN C) 500 MG tablet Take 500 mg by mouth daily.   Yes [provider]  aspirin 81 MG tablet Chew 81 mg by mouth daily.   Yes [provider]  atorvastatin (LIPITOR) 80 MG tablet Take 1 tablet (80 mg total) by mouth daily at 6 PM. 09/13/16  Yes Mikhail, Velta Addison, DO  calcium carbonate (TUMS - DOSED IN MG ELEMENTAL CALCIUM) 500 MG chewable tablet Chew 1 tablet by mouth daily.   Yes [provider]  collagenase (SANTYL) ointment Apply 1 application topically daily. To wound on right lateral food   Yes [provider]  ertapenem (INVANZ) IVPB Inject 500 mg into the vein daily. After Dialysis on dialysis days   Yes [provider]  glipiZIDE (GLUCOTROL XL) 2.5 MG 24 hr tablet Take 2.5 mg by mouth daily with breakfast.    Yes [provider]  isosorbide mononitrate (IMDUR) 30 MG 24 hr tablet Take 30 mg by mouth every evening.    Yes [provider]  lamoTRIgine (LAMICTAL) 25 MG tablet Take 25 mg by mouth at bedtime.    Yes [provider]  lidocaine-prilocaine (EMLA) cream Apply 1 application topically Every Tuesday,Thursday,and Saturday with dialysis. Thick layer to fistula site left arm then wrap with saran wrap prior to dialysis   Yes [provider]  metoprolol tartrate (LOPRESSOR) 25 MG tablet Take 25 mg by mouth 2 (two) times daily.   Yes [provider]  multivitamin (RENA-VIT) TABS tablet Take 1 tablet by mouth daily.   Yes [provider]  nitroGLYCERIN (NITROSTAT) 0.4 MG SL tablet Place 0.4 mg under the tongue every 5 (five) minutes as needed for chest pain.   Yes [provider]  Nutritional Supplements (FEEDING SUPPLEMENT, NEPRO CARB STEADY,) LIQD Take 237 mLs by mouth 2 (two) times daily between meals.    Yes [provider]  warfarin (COUMADIN) 5 MG tablet Take 1 tablet (5 mg total) by mouth one time only at 6 PM. Patient not taking: Reported on 11/11/2016 09/13/16   Cristal Ford, DO    Physical Exam: Vitals:   09/10/17 1950 09/10/17 2230 09/10/17 2345 09/10/17 2349  BP:  (!) 141/65 (!) 127/58   Pulse:      Resp:   (!) 22 (!) 22  Temp:      TempSrc:      SpO2:   98%   Weight: 79.4 kg (175 lb)     Height: 6' (1.829 m)         Constitutional: Moderately built and nourished. Vitals:   09/10/17 1950 09/10/17 2230 09/10/17 2345 09/10/17 2349  BP:  (!) 141/65 (!) 127/58   Pulse:      Resp:   (!) 22 (!) 22  Temp:      TempSrc:      SpO2:   98%   Weight: 79.4 kg (175 lb)     Height: 6' (1.829 m)      Eyes: Anicteric no pallor. ENMT: No discharge from the ears eyes nose or mouth. Neck: No mass felt.  No neck rigidity.  No JVD appreciated. Respiratory: No rhonchi or crepitations. Cardiovascular: S1-S2 heard no  murmurs appreciated. Abdomen: Soft nontender bowel sounds present. Musculoskeletal: Has a wound on the right foot.  Left BKA. Skin: Wound on the right foot. Neurologic: Alert awake oriented and moves all extremity initially.  Patient after the seizure became mildly confused.  Still moving all extremities pupils are reacting to light. Psychiatric: Mildly confused after seizure.   Labs on Admission: I have personally reviewed following labs and imaging studies  CBC: Recent Labs  Lab 09/10/17 2047  WBC 8.6  NEUTROABS 6.2  HGB 12.2*  HCT 39.1  MCV 90.5  PLT 086   Basic Metabolic Panel: Recent Labs  Lab 09/10/17 2047  NA 139  K 4.4  CL 95*  CO2 24  GLUCOSE 68*  BUN 33*  CREATININE 6.53*  CALCIUM 8.7*   GFR: Estimated Creatinine Clearance: 12 mL/min (A) (by C-G formula based on SCr of 6.53 mg/dL (H)). Liver Function Tests: Recent Labs  Lab 09/10/17 2047  AST 51*  ALT QUANTITY NOT SUFFICIENT, UNABLE TO PERFORM TEST  ALKPHOS 153*  BILITOT QUANTITY NOT SUFFICIENT, UNABLE TO PERFORM TEST  PROT 7.6  ALBUMIN 3.6   No results for input(s): LIPASE, AMYLASE in the last 168 hours. No results for input(s): AMMONIA in the last 168 hours. Coagulation Profile: Recent Labs  Lab 09/10/17 2254  INR 1.14   Cardiac Enzymes: Recent Labs  Lab 09/10/17 2047  TROPONINI 0.05*   BNP (last 3 results) No results for input(s): PROBNP in the last 8760 hours. HbA1C: No results for input(s): HGBA1C in the last 72 hours. CBG: Recent Labs  Lab 09/10/17 2131 09/10/17 2259 09/10/17 2344 09/11/17 0032  GLUCAP 65* 57* 54* 118*   Lipid Profile: No results for input(s): CHOL, HDL, LDLCALC, TRIG, CHOLHDL, LDLDIRECT in the last 72 hours. Thyroid Function Tests: No results for input(s): TSH, T4TOTAL, FREET4, T3FREE, THYROIDAB in the last 72 hours. Anemia Panel: No results for input(s): VITAMINB12, FOLATE, FERRITIN, TIBC, IRON, RETICCTPCT in the last 72 hours. Urine analysis:      Component Value Date/Time   COLORURINE YELLOW 11/11/2016 1530   APPEARANCEUR HAZY (A) 11/11/2016 1530   LABSPEC 1.013 11/11/2016 1530   PHURINE 5.0 11/11/2016 1530   GLUCOSEU NEGATIVE 11/11/2016 1530   HGBUR SMALL (A) 11/11/2016 1530   BILIRUBINUR NEGATIVE 11/11/2016 [place] 11/11/2016 1530   PROTEINUR 100 (A) 11/11/2016 1530   NITRITE NEGATIVE 11/11/2016 1530   LEUKOCYTESUR LARGE (A) 11/11/2016 1530   Sepsis Labs: @LABRCNTIP (procalcitonin:4,lacticidven:4) )No results found for this or any previous visit (from the past 240 hour(s)).   Radiological Exams on Admission: Dg Chest 2 View  Result Date: 09/10/2017 CLINICAL DATA:  Syncope. EXAM: CHEST - 2 VIEW COMPARISON:  Radiographs of November 11, 2016. FINDINGS: Stable cardiomegaly. Both lungs are clear. No pneumothorax or pleural effusion is noted. The visualized skeletal structures are unremarkable. IMPRESSION: No active cardiopulmonary disease. Electronically Signed   By: Marijo Conception, M.D.   On: 09/10/2017 20:38   Ct Head Wo Contrast  Result Date: 09/10/2017 CLINICAL DATA:  Initial evaluation for seizure. EXAM: CT HEAD WITHOUT CONTRAST TECHNIQUE: Contiguous axial images were obtained from the base of the skull through the vertex without intravenous contrast. COMPARISON:  Prior CT from 09/07/2016. FINDINGS: Brain: Moderately advanced cerebral atrophy with chronic small vessel ischemic disease, progressed relative to previous. Remote lacunar infarct present at the left caudate. Remote bilateral parietooccipital infarctions, left greater than right with associated encephalomalacia and gliosis. 3.2 cm meningioma overlies the right frontal convexity without significant mass effect, similar to previous. No acute intracranial hemorrhage. No acute large vessel territory infarct. No other mass lesion. No mass effect or midline shift. No hydrocephalus. No extra-axial fluid collection. Vascular: No hyperdense vessel. Calcified  atherosclerosis at the skull base. Skull: Scalp soft tissues and calvarium within normal limits. Sinuses/Orbits: Globes and orbital soft tissues within normal limits. Paranasal sinuses are clear. No mastoid effusion. Other: None. IMPRESSION: 1. No acute intracranial abnormality. 2. Chronic bilateral parieto-occipital infarcts, left greater than right. 3. 3.2 cm meningioma overlying the right  frontal convexity without associated mass effect. 4. Moderately advanced cerebral atrophy with chronic small vessel ischemic disease, mildly progressed from previous. Electronically Signed   By: Jeannine Boga M.D.   On: 09/10/2017 20:37    EKG: Independently reviewed.  A. fib rate controlled at 80 bpm.  Confirm with cardiologist.  Assessment/Plan Principal Problem:   Syncope Active Problems:   Blindness   Seizure (HCC)   ESRD on hemodialysis (HCC)   Elevated troponin   Atrial fibrillation, chronic (HCC)   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[place])   Essential hypertension   Hypoglycemia    1. Seizure -patient had a witnessed tonic-clonic seizure in the ER.  Lasted for less than a minute and was stopped after patient was given Ativan 2 mg IV.  Patient's blood sugar at the time was around 55.  Discussed with on-call neurologist Dr. Leonel Ramsay.  Dr. Leonel Ramsay at this time advised to get MRI of the brain EEG and closely follow CBGs.  Could be hypoglycemic induced seizure but at this time requested no antiseizure medications for now.  In addition we will check 2D echo and cycle cardiac markers.  Syncopal episode probably could be from seizure. 2. Hypoglycemia with history of diabetes mellitus type 2 we will hold oral hypoglycemics.  Check hemoglobin A1c and closely follow CBGs every hour for now. 3. ESRD on hemodialysis -consult nephrology for dialysis. 4. Chronic atrial fibrillation rate controlled on metoprolol.  EKG was reading as type II second-degree AV block but I confirmed with cardiologist Dr.  Lamar Lions EKG to be A. fib.  Repeat EKG to be done in the morning.  Patient is not on anticoagulation exact reason are not known. 5. Has chronic right foot wound.  Will obtain wound consult. 6. CAD on statins aspirin and metoprolol. 7. Patient is on ertapenem for 1 week probably for the wound probably exact reason not known.   DVT prophylaxis: Heparin. Code Status: Full code. Family Communication: No family at the bedside. Disposition Plan: Back to skilled nursing facility. Consults called: Discussed with neurologist. Admission status: Observation.   Rise Patience MD [facility] Pager [phone].  If 7PM-7AM, please contact night-coverage [email or web address] Password [email or web address]  09/11/2017, 12:48 AM

## 2017-09-11 NOTE — Consult Note (Signed)
Reason for Consult: To manage dialysis and dialysis related needs Referring Physician: Dr. Hal Hope   HPI: 28JME ESRD on HD TTS, DM, atrial fibrillation, HTN who presented from SNF yesterday with syncope in setting of hypoglycemia.  BG running in the 50s in the ED and he went on to have tonic clonic seizure which resolved with ativan 42m IV.  Admitted to stepdown, plan for EEG and MRI (completed and unrevealing); no antiseizure medications yet as could be related to hypoglycemia.    This morning he's been hypoglycemic, managed with PO dextrose and juice.  No further seizures.  He denies complaints currently but is oriented only to [place], not year or detailed circumstances.  He says he's done dialysis for 'a few years' and denies issues with treatments.  He denies dyspnea, orthopnea, chest pain.  Troponins are being cycled 0.08 at 1:30am, 0.14 at 7:14am.  EKG on presentation per H&P rate controlled A fib. He's been receiving ertapenem outpt for a right foot wound which has been addressed by wound RN here.  Has been afebrile with no other signs of active infection.   Dialyzes at [facility]  EDW 165lbs. 4 hours, BFR 350, DFR 800. HD Bath 2K/2.5Ca, Dialyzer F200, Heparin 3800u IV bolus. Access L distal AVG (A5), 15g needles.  Past Medical History:  Diagnosis Date  . Atrial fibrillation ([facility]    a. Chronic Eliquis (CHA2DS2VASc = 6).  . Chronic combined systolic (congestive) and diastolic (congestive) heart failure ([place] [place]    a. Previously reported EF of 30%;  b. 08/2016 Echo: EF 40-45%, antsept, inf, infsept HK, Gr3 DD, mod AI/MR, sev dil LA, mild TR, PASP 682mg.  . Marland Kitchenementia   . Encephalopathy   . End stage renal disease ([facility]   a. On HD.  . Marland Kitchenssential hypertension   . GIB (gastrointestinal bleeding)    a. 08/2016 in setting of Eliquis Rx.  . Pleural effusion   . PVD (peripheral vascular disease) ([facility]   a. s/p L BKA;  b. Chronic LE wounds.  . Stroke ([facility]  . Type II diabetes  mellitus ([facility]    Past Surgical History:  Procedure Laterality Date  . BELOW KNEE LEG AMPUTATION Left   . FLEXIBLE SIGMOIDOSCOPY N/A 09/07/2016   Procedure: FLEXIBLE SIGMOIDOSCOPY;  Surgeon: ArManus GunningMD;  [facility];  Service: Gastroenterology;  Laterality: N/A;    Family History  Problem Relation Age of Onset  . Other Mother        Pt unsure of PMH of family members.    Social History:  reports that he has quit smoking. He has never used smokeless tobacco. He reports that he does not drink alcohol or use drugs.  Allergies: No Known Allergies  Medications: I have reviewed the patient's current medications. Hectorol 2.33m333mMircera 50m27m interval not noted  Venofer none ordered outpatient  Results for orders placed or performed during the hospital encounter of 09/10/17 (from the past 48 hour(s))  Comprehensive metabolic panel     Status: Abnormal   Collection Time: 09/10/17  8:47 PM  Result Value Ref Range   Sodium 139 135 - 145 mmol/L   Potassium 4.4 3.5 - 5.1 mmol/L   Chloride 95 (L) 98 - 111 mmol/L   CO2 24 22 - 32 mmol/L   Glucose, Bld 68 (L) 70 - 99 mg/dL   BUN 33 (H) 8 - 23 mg/dL   Creatinine, Ser 6.53 (H) 0.61 - 1.24 mg/dL   Calcium  8.7 (L) 8.9 - 10.3 mg/dL   Total Protein 7.6 6.5 - 8.1 g/dL   Albumin 3.6 3.5 - 5.0 g/dL   AST 51 (H) 15 - 41 U/L   ALT QUANTITY NOT SUFFICIENT, UNABLE TO PERFORM TEST 0 - 44 U/L   Alkaline Phosphatase 153 (H) 38 - 126 U/L   Total Bilirubin QUANTITY NOT SUFFICIENT, UNABLE TO PERFORM TEST 0.3 - 1.2 mg/dL   GFR calc non Af Amer 8 (L) >60 mL/min   GFR calc Af Amer 9 (L) >60 mL/min    Comment: (NOTE) The eGFR has been calculated using the CKD EPI equation. This calculation has not been validated in all clinical situations. eGFR's persistently <60 mL/min signify possible Chronic Kidney Disease.    Anion gap 20 (H) 5 - 15    Comment: Performed at [facility], [place] [address], [place], [place] [address]   CBC WITH DIFFERENTIAL     Status: Abnormal   Collection Time: 09/10/17  8:47 PM  Result Value Ref Range   WBC 8.6 4.0 - 10.5 K/uL   RBC 4.32 4.22 - 5.81 MIL/uL   Hemoglobin 12.2 (L) 13.0 - 17.0 g/dL   HCT 39.1 39.0 - 52.0 %   MCV 90.5 78.0 - 100.0 fL   MCH 28.2 26.0 - 34.0 pg   MCHC 31.2 30.0 - 36.0 g/dL   RDW 17.8 (H) 11.5 - 15.5 %   Platelets 230 150 - 400 K/uL   Neutrophils Relative % 71 %   Neutro Abs 6.2 1.7 - 7.7 K/uL   Lymphocytes Relative 14 %   Lymphs Abs 1.2 0.7 - 4.0 K/uL   Monocytes Relative 10 %   Monocytes Absolute 0.8 0.1 - 1.0 K/uL   Eosinophils Relative 3 %   Eosinophils Absolute 0.2 0.0 - 0.7 K/uL   Basophils Relative 1 %   Basophils Absolute 0.0 0.0 - 0.1 K/uL   Immature Granulocytes 1 %   Abs Immature Granulocytes 0.1 0.0 - 0.1 K/uL    Comment: Performed at [facility] [address] [place], [place] [address]  Ethanol/ETOH     Status: None   Collection Time: 09/10/17  8:47 PM  Result Value Ref Range   Alcohol, Ethyl (B) <10 <10 mg/dL    Comment: (NOTE) Lowest detectable limit for serum alcohol is 10 mg/dL. For medical purposes only. Performed at [facility], [place] [address], [place], [place] [address]   Troponin I     Status: Abnormal   Collection Time: 09/10/17  8:47 PM  Result Value Ref Range   Troponin I 0.05 (HH) <0.03 ng/mL    Comment: CRITICAL RESULT CALLED TO, READ BACK BY AND VERIFIED WITH: GIBSON,J RN 09/10/2017 2231 JORDANS Performed at [facility], [place] [address] [place], [place] [address]   CBG monitoring, ED     Status: Abnormal   Collection Time: 09/10/17  9:31 PM  Result Value Ref Range   Glucose-Capillary 65 (L) 70 - 99 mg/dL   Comment 1 Notify RN    Comment 2 Document in Chart   Protime-INR     Status: None   Collection Time: 09/10/17 10:54 PM  Result Value Ref Range   Prothrombin Time 14.5 11.4 - 15.2 seconds   INR 1.14     Comment: Performed at [facility], [place] [address] [place],  [place] [address]  CBG monitoring, ED     Status: Abnormal   Collection Time: 09/10/17 10:59 PM  Result Value Ref Range   Glucose-Capillary 57 (L) 70 - 99 mg/dL  CBG monitoring, ED     Status: Abnormal   Collection Time: 09/10/17 11:44 PM  Result Value Ref Range   Glucose-Capillary 54 (L) 70 - 99 mg/dL  CBG monitoring, ED     Status: Abnormal   Collection Time: 09/11/17 12:32 AM  Result Value Ref Range   Glucose-Capillary 118 (H) 70 - 99 mg/dL   Comment 1 Notify RN    Comment 2 Document in Chart   CBC     Status: Abnormal   Collection Time: 09/11/17  1:30 AM  Result Value Ref Range   WBC 7.8 4.0 - 10.5 K/uL   RBC 3.95 (L) 4.22 - 5.81 MIL/uL   Hemoglobin 10.9 (L) 13.0 - 17.0 g/dL   HCT 36.3 (L) 39.0 - 52.0 %   MCV 91.9 78.0 - 100.0 fL   MCH 27.6 26.0 - 34.0 pg   MCHC 30.0 30.0 - 36.0 g/dL   RDW 18.1 (H) 11.5 - 15.5 %   Platelets 226 150 - 400 K/uL    Comment: Performed at [facility], [address], [place], [place] [address]  Creatinine, serum     Status: Abnormal   Collection Time: 09/11/17  1:30 AM  Result Value Ref Range   Creatinine, Ser 7.19 (H) 0.61 - 1.24 mg/dL   GFR calc non Af Amer 7 (L) >60 mL/min   GFR calc Af Amer 8 (L) >60 mL/min    Comment: (NOTE) The eGFR has been calculated using the CKD EPI equation. This calculation has not been validated in all clinical situations. eGFR's persistently <60 mL/min signify possible Chronic Kidney Disease. Performed at [facility], [place] [address] [place], [place] [address]   Hepatic function panel     Status: Abnormal   Collection Time: 09/11/17  1:30 AM  Result Value Ref Range   Total Protein 7.4 6.5 - 8.1 g/dL   Albumin 3.6 3.5 - 5.0 g/dL   AST 43 (H) 15 - 41 U/L   ALT 37 0 - 44 U/L   Alkaline Phosphatase 143 (H) 38 - 126 U/L   Total Bilirubin 0.8 0.3 - 1.2 mg/dL   Bilirubin, Direct 0.2 0.0 - 0.2 mg/dL   Indirect Bilirubin 0.6 0.3 - 0.9 mg/dL    Comment: Performed at [facility] [address], [place], [place] [address]  Magnesium     Status: None   Collection Time: 09/11/17  1:30 AM  Result Value Ref Range   Magnesium 2.1 1.7 - 2.4 mg/dL    Comment: Performed at [facility], [place] [address], [place], [place] [address]  TSH     Status: None   Collection Time: 09/11/17  1:30 AM  Result Value Ref Range   TSH 1.551 0.350 - 4.500 uIU/mL    Comment: Performed by a 3rd Generation assay with a functional sensitivity of <=0.01 uIU/mL. Performed at [facility], [place] [address], [place], [place] [address]   Troponin I     Status: Abnormal   Collection Time: 09/11/17  1:30 AM  Result Value Ref Range   Troponin I 0.08 (HH) <0.03 ng/mL    Comment: CRITICAL VALUE NOTED.  VALUE IS CONSISTENT WITH PREVIOUSLY REPORTED AND CALLED VALUE. Performed at [facility], [place] [address], [place], [place] [address]   Hemoglobin A1c     Status: Abnormal   Collection Time: 09/11/17  1:30  AM  Result Value Ref Range   Hgb A1c MFr Bld 7.2 (H) 4.8 - 5.6 %    Comment: (NOTE) Pre diabetes:          5.7%-6.4% Diabetes:              >6.4% Glycemic control for   <7.0% adults with diabetes    Mean Plasma Glucose 159.94 mg/dL    Comment: Performed at [facility] [address], [place], [place] [address]  CBG monitoring, ED     Status: Abnormal   Collection Time: 09/11/17  1:48 AM  Result Value Ref Range   Glucose-Capillary 128 (H) 70 - 99 mg/dL   Comment 1 Notify RN    Comment 2 Document in Chart   MRSA PCR Screening     Status: Abnormal   Collection Time: 09/11/17  2:20 AM  Result Value Ref Range   MRSA by PCR POSITIVE (A) NEGATIVE    Comment:        The GeneXpert MRSA Assay (FDA approved for NASAL specimens only), is one component of a comprehensive MRSA colonization surveillance program. It is not intended to diagnose MRSA infection nor to guide or monitor treatment for MRSA infections. RESULT CALLED TO, READ BACK BY AND VERIFIED WITHLavonia Drafts RN 09/11/17 8502  JDW Performed at [facility], [place] [address] [place], [place] [address]   Glucose, capillary     Status: Abnormal   Collection Time: 09/11/17  6:27 AM  Result Value Ref Range   Glucose-Capillary 39 (LL) 70 - 99 mg/dL  Glucose, capillary     Status: Abnormal   Collection Time: 09/11/17  6:43 AM  Result Value Ref Range   Glucose-Capillary 55 (L) 70 - 99 mg/dL  Glucose, capillary     Status: Abnormal   Collection Time: 09/11/17  6:55 AM  Result Value Ref Range   Glucose-Capillary 109 (H) 70 - 99 mg/dL  Basic metabolic panel     Status: Abnormal   Collection Time: 09/11/17  7:14 AM  Result Value Ref Range   Sodium 139 135 - 145 mmol/L   Potassium 4.1 3.5 - 5.1 mmol/L   Chloride 98 98 - 111 mmol/L   CO2 25 22 - 32 mmol/L   Glucose, Bld 96 70 - 99 mg/dL   BUN 38 (H) 8 - 23 mg/dL   Creatinine, Ser 7.22 (H) 0.61 - 1.24 mg/dL   Calcium 8.4 (L) 8.9 - 10.3 mg/dL   GFR calc non Af Amer 7 (L) >60 mL/min   GFR calc Af Amer 8 (L) >60 mL/min    Comment: (NOTE) The eGFR has been calculated using the CKD EPI equation. This calculation has not been validated in all clinical situations. eGFR's persistently <60 mL/min signify possible Chronic Kidney Disease.    Anion gap 16 (H) 5 - 15    Comment: Performed at [facility], [place] [address], [place], [place] [address]  Troponin I     Status: Abnormal   Collection Time: 09/11/17  7:14 AM  Result Value Ref Range   Troponin I 0.14 (HH) <0.03 ng/mL    Comment: CRITICAL VALUE NOTED.  VALUE IS CONSISTENT WITH PREVIOUSLY REPORTED AND CALLED VALUE. Performed at [facility], [place] [address], [place], [place] [address]   Glucose, capillary     Status: None   Collection Time: 09/11/17  8:17 AM  Result Value Ref Range   Glucose-Capillary 82 70 - 99 mg/dL  Glucose,  capillary     Status: Abnormal   Collection Time: 09/11/17  9:17 AM  Result Value Ref Range   Glucose-Capillary 36 (LL) 70 - 99 mg/dL   Comment 1 Notify RN   Glucose,  capillary     Status: Abnormal   Collection Time: 09/11/17  9:33 AM  Result Value Ref Range   Glucose-Capillary 48 (L) 70 - 99 mg/dL  Glucose, capillary     Status: Abnormal   Collection Time: 09/11/17  9:48 AM  Result Value Ref Range   Glucose-Capillary 43 (LL) 70 - 99 mg/dL   Comment 1 Notify RN    Comment 2 Repeat Test    Comment 3 Document in Chart   Glucose, capillary     Status: Abnormal   Collection Time: 09/11/17 10:06 AM  Result Value Ref Range   Glucose-Capillary 57 (L) 70 - 99 mg/dL  Glucose, capillary     Status: None   Collection Time: 09/11/17 10:23 AM  Result Value Ref Range   Glucose-Capillary 76 70 - 99 mg/dL    Dg Chest 2 View  Result Date: 09/10/2017 CLINICAL DATA:  Syncope. EXAM: CHEST - 2 VIEW COMPARISON:  Radiographs of November 11, 2016. FINDINGS: Stable cardiomegaly. Both lungs are clear. No pneumothorax or pleural effusion is noted. The visualized skeletal structures are unremarkable. IMPRESSION: No active cardiopulmonary disease. Electronically Signed   By: Marijo Conception, M.D.   On: 09/10/2017 20:38   Ct Head Wo Contrast  Result Date: 09/10/2017 CLINICAL DATA:  Initial evaluation for seizure. EXAM: CT HEAD WITHOUT CONTRAST TECHNIQUE: Contiguous axial images were obtained from the base of the skull through the vertex without intravenous contrast. COMPARISON:  Prior CT from 09/07/2016. FINDINGS: Brain: Moderately advanced cerebral atrophy with chronic small vessel ischemic disease, progressed relative to previous. Remote lacunar infarct present at the left caudate. Remote bilateral parietooccipital infarctions, left greater than right with associated encephalomalacia and gliosis. 3.2 cm meningioma overlies the right frontal convexity without significant mass effect, similar to previous. No acute intracranial hemorrhage. No acute large vessel territory infarct. No other mass lesion. No mass effect or midline shift. No hydrocephalus. No extra-axial fluid  collection. Vascular: No hyperdense vessel. Calcified atherosclerosis at the skull base. Skull: Scalp soft tissues and calvarium within normal limits. Sinuses/Orbits: Globes and orbital soft tissues within normal limits. Paranasal sinuses are clear. No mastoid effusion. Other: None. IMPRESSION: 1. No acute intracranial abnormality. 2. Chronic bilateral parieto-occipital infarcts, left greater than right. 3. 3.2 cm meningioma overlying the right frontal convexity without associated mass effect. 4. Moderately advanced cerebral atrophy with chronic small vessel ischemic disease, mildly progressed from previous. Electronically Signed   By: Jeannine Boga M.D.   On: 09/10/2017 20:37   Mr Brain Wo Contrast  Result Date: 09/11/2017 CLINICAL DATA:  Syncope.  Possible seizure. EXAM: MRI HEAD WITHOUT CONTRAST TECHNIQUE: Multiplanar, multiecho pulse sequences of the brain and surrounding structures were obtained without intravenous contrast. COMPARISON:  Head CT 09/10/2017 Brain MRI 09/08/2016 FINDINGS: BRAIN: There is no acute infarct, acute hemorrhage or mass effect. The midline structures are normal. There are multiple old cerebellar, left basal ganglia, right occipital and left parieto-occipital infarcts. Occipital Multifocal white matter hyperintensity, most commonly due to chronic ischemic microangiopathy. Advanced atrophy for age. Susceptibility-sensitive sequences show no chronic microhemorrhage or superficial siderosis. Right frontal convexity meningioma measures 3.5 cm at its dural base. VASCULAR: Major intracranial arterial and venous sinus flow voids are preserved. SKULL AND UPPER CERVICAL SPINE: The visualized skull base, calvarium,  upper cervical spine and extracranial soft tissues are normal. SINUSES/ORBITS: Right maxillary retention cyst. No fluid levels or advanced mucosal thickening. No mastoid or middle ear effusion. The orbits are normal. IMPRESSION: Atrophy, chronic ischemic microangiopathy and  multiple old infarcts without acute abnormality. Electronically Signed   By: Ulyses Jarred M.D.   On: 09/11/2017 05:17    ROS: 10 system ROS negative except per HPI above.    Blood pressure (P) 101/67, pulse (!) (P) 59, temperature (P) 97.6 F (36.4 C), temperature source (P) Oral, resp. rate (P) 18, height 6' (1.829 m), weight 83 kg (182 lb 15.7 oz), SpO2 90 %. Gen: chronically ill appearing, sitting comfortably in bed Eyes: muddy anicteric sclera ENT: MMM CV: RRR, no rub or S4 Lungs: clear with normal WOB Abd: soft, nontender with palpation GU: no foley Extr: L AKA, R foot with dressed wound on lateral portion of foot Neuro: oriented to Sarita Haver, [place]; doesn't know year, hospital, how he got here. Mumbling at times  Assessment/Plan: 1. Syncope and seizure:  Appears most likely due to hypoglycemia and his BG has been dipping into the 40s still as of this AM.  Will continue q1hr BG monitoring while in dialysis and treat as needed.   2 ESRD: Plan to continue his outpatient regimen here.   3 Hypertension: Not an active issue 4. 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  On mircera outpatient.  Will not give ESA today but if remains hospitalized will need to determine when next dose is due.  5. Metabolic Bone Disease: Corr ca ok, added on phosphorus.  Continue outpt dose hectorol here.    Jannifer Hick A 09/11/2017, 12:10 PM

## 2017-09-11 NOTE — Consult Note (Signed)
[facility] Nurse wound consult note Assessment completed in [place].  No family present. Reason for Consult: Right foot wound Wound type: Etiology is unclear to me at this time. Measurement: 3.3 cm x 3.3 cm x 0.1cm Wound bed: 100% clean, pink, dry Drainage (amount, consistency, odor) No drainage, no odor, no induration. Periwound: Normal color and texture skin. Dressing procedure/placement/frequency: Thin hydrocolloid applied after cleansing wound.  This type of dressing can be left in place for up to 5 days.  Monitor the wound area(s) for worsening of condition such as: Signs/symptoms of infection,  Increase in size,  Development of or worsening of odor, Development of pain, or increased pain at the affected locations.  Notify the medical team if any of these develop.  Thank you for the consult.  Discussed plan of care with the patient and bedside nurse.  [facility] nurse will not follow at this time.  Please re-consult the [facility] team if needed.  Val Riles, RN, MSN, CWOCN, CNS-BC, pager [phone]

## 2017-09-11 NOTE — Progress Notes (Signed)
Pt transported to room from ED.  Patient vital signs are stable, cbg checked wnl, patient was orientated to room and unit rules.  Call bell placed within reach.  Oxygen, suction, and seizure pads present in room.

## 2017-09-12 DIAGNOSIS — R55 Syncope and collapse: Secondary | ICD-10-CM | POA: Diagnosis not present

## 2017-09-12 DIAGNOSIS — Z992 Dependence on renal dialysis: Secondary | ICD-10-CM

## 2017-09-12 DIAGNOSIS — R748 Abnormal levels of other serum enzymes: Secondary | ICD-10-CM | POA: Diagnosis not present

## 2017-09-12 DIAGNOSIS — I10 Essential (primary) hypertension: Secondary | ICD-10-CM

## 2017-09-12 DIAGNOSIS — N186 End stage renal disease: Secondary | ICD-10-CM | POA: Diagnosis not present

## 2017-09-12 DIAGNOSIS — I482 Chronic atrial fibrillation: Secondary | ICD-10-CM | POA: Diagnosis not present

## 2017-09-12 LAB — GLUCOSE, CAPILLARY
GLUCOSE-CAPILLARY: 82 mg/dL (ref 70–99)
GLUCOSE-CAPILLARY: 97 mg/dL (ref 70–99)
GLUCOSE-CAPILLARY: 110 mg/dL — AB (ref 70–99)
GLUCOSE-CAPILLARY: 95 mg/dL (ref 70–99)
Glucose-Capillary: 101 mg/dL — ABNORMAL HIGH (ref 70–99)
Glucose-Capillary: 108 mg/dL — ABNORMAL HIGH (ref 70–99)
Glucose-Capillary: 143 mg/dL — ABNORMAL HIGH (ref 70–99)
Glucose-Capillary: 150 mg/dL — ABNORMAL HIGH (ref 70–99)
Glucose-Capillary: 156 mg/dL — ABNORMAL HIGH (ref 70–99)
Glucose-Capillary: 75 mg/dL (ref 70–99)
Glucose-Capillary: 97 mg/dL (ref 70–99)

## 2017-09-12 MED ORDER — ERTAPENEM IV (FOR PTA / DISCHARGE USE ONLY): INTRAVENOUS | Status: DC

## 2017-09-12 MED ORDER — ERTAPENEM IV (FOR PTA / DISCHARGE USE ONLY): 500.0000 mg | INTRAVENOUS | Status: DC

## 2017-09-12 NOTE — Progress Notes (Addendum)
Pt scheduled for discharge back to [facility]. Morene Crocker, RN got locked wallet from security.  Money counted with patient, Gwyndolyn Saxon and myself.  Pt signed and $994 total returned to patient. He has all belongings with him, including his glasses with one lens, and has prosthesis on.  He verbalizes that he understand discharge plan.  Awaiting PTAR.  Report called to [place] at [facility].  She stated that pt did have only one lens in his glasses.

## 2017-09-12 NOTE — Discharge Summary (Addendum)
Physician Discharge Summary  [name] ACZ:[id number] DOB: Mar 25, 1950 DOA: 09/10/2017  PCP: Caprice Renshaw, MD  Admit date: 09/10/2017 Discharge date: 09/12/2017  Admitted From: SNF Disposition: SNF   Recommendations for Outpatient Follow-up:  1. Follow up with PCP in 1-2 weeks 2. Continue management of DM. Holding sulfonylurea in setting of likely hypoglycemia-induced seizure. No AEDs started per neurology. 3. Continue ertapenem as he was taking prior to arrival. Will need one dose today (7/26) and one dose tomorrow (7/27 after dialysis) to complete the course. I verified with pharmacy that this can be administered IM.   Home Health: N/A Equipment/Devices: Per SNF Discharge Condition: Stable CODE STATUS: Full Diet recommendation: Renal  Brief/Interim Summary: George Burgess is a 67 year old male with history of end-stage renal disease on HD, chronic combined systolic and diastolic CHF, diabetes mellitus, A. fib, peripheral vascular disease status post left BKA, who was admitted 7/25 to the hospital with a syncopal episode, found to be hypoglycemic and noted 1 min grand mal witnessed seizure in ED terminated with ativan. Nauerology was consulted and recommended MRI, EEG but no antiseizure medications for now. MRI showed Atrophy, chronic ischemic microangiopathy and multiple old infarcts without acute abnormality. EEG was interpreted as a normal awake and drowsy electroencephalogram.  No epileptiform activity is noted. Glipizide was held and the patient was able to stave off hypoglycemia by po intake. He's had no further hypoglycemia, is eating well with resolution of seizure-like activity and post-ictal activity. He is stable for discharge.  Discharge Diagnoses:  Principal Problem:   Syncope Active Problems:   Blindness   Seizure ([place])   ESRD on hemodialysis (HCC)   Elevated troponin   Atrial fibrillation, chronic (HCC)   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[place])   Essential  hypertension   Hypoglycemia  Hypoglycemic seizure: Neurology consulted, Dr. Leonel Ramsay recommended MRI and EEG. Since these showed no acute abnormalities, seizure is thought to be provoked by hypoglycemia and treatment is directed at glucose regulation without AED at this time. Pt is on lamictal 25mg  qHS PTA, no report of stopping, which will be continued.  T2DM: HbA1c 7.2% - Stop glipizide for right now.   End-stage renal disease:  - Continue HD on schedule  Chronic AFib: Has been rate controlled with a short episode of ventricular bigeminy and occasional PVCs here.  - Continue metoprolol. Not on anticoagulation PTA with episode of GI bleeding. Defer to outpatient MD.  - Recommend follow up with cardiology, consideration of 30 day event monitor if syncope recurs, though currently thought to be due to hypoglycemia and/or seizure.   Right foot wound: Chronic, wound looks well without any signs of active infection. - Continue local wound care. Has been received invanz at HD, per pharmacy he is supposed to have the last dose on 7/27  Coronary artery disease: No chest pain, continue home medications. Troponin elevation thought to be due to seizure. Not consistent with ACS, has plateaued.  Discharge Instructions Discharge Instructions    Diet - low sodium heart healthy   Complete by:  As directed    Increase activity slowly   Complete by:  As directed      Allergies as of 09/12/2017   No Known Allergies     Medication List    STOP taking these medications   glipiZIDE 2.5 MG 24 hr tablet Commonly known as:  GLUCOTROL XL   warfarin 5 MG tablet Commonly known as:  COUMADIN     TAKE these medications   acetaminophen  325 MG tablet Commonly known as:  TYLENOL Take 650 mg by mouth every 4 (four) hours as needed for moderate pain.   ascorbic acid 500 MG tablet Commonly known as:  VITAMIN C Take 500 mg by mouth daily.   aspirin 81 MG tablet Chew 81 mg by mouth daily.    atorvastatin 80 MG tablet Commonly known as:  LIPITOR Take 1 tablet (80 mg total) by mouth daily at 6 PM.   calcium carbonate 500 MG chewable tablet Commonly known as:  TUMS - dosed in mg elemental calcium Chew 1 tablet by mouth daily.   collagenase ointment Commonly known as:  SANTYL Apply 1 application topically daily. To wound on right lateral food   ertapenem IVPB Commonly known as:  INVANZ 500mg  IM daily. 2 doses remaining (on 7/26 and 7/27 following hemodialysis). What changed:    how much to take  how to take this  when to take this  additional instructions   feeding supplement (NEPRO CARB STEADY) Liqd Take 237 mLs by mouth 2 (two) times daily between meals.   isosorbide mononitrate 30 MG 24 hr tablet Commonly known as:  IMDUR Take 30 mg by mouth every evening.   lamoTRIgine 25 MG tablet Commonly known as:  LAMICTAL Take 25 mg by mouth at bedtime.   lidocaine-prilocaine cream Commonly known as:  EMLA Apply 1 application topically Every Tuesday,Thursday,and Saturday with dialysis. Thick layer to fistula site left arm then wrap with saran wrap prior to dialysis   metoprolol tartrate 25 MG tablet Commonly known as:  LOPRESSOR Take 25 mg by mouth 2 (two) times daily.   multivitamin Tabs tablet Take 1 tablet by mouth daily.   nitroGLYCERIN 0.4 MG SL tablet Commonly known as:  NITROSTAT Place 0.4 mg under the tongue every 5 (five) minutes as needed for chest pain.       Contact information for follow-up providers    Caprice Renshaw, MD Follow up.   Specialty:  Internal Medicine Contact information: [place] [address] [phone]            Contact information for after-discharge care    Destination    [facility] .   Service:  Skilled Nursing Contact information: [place] [phone]                 No Known  Allergies  Consultations:  Neurology  Procedures/Studies: Dg Chest 2 View  Result Date: 09/10/2017 CLINICAL DATA:  Syncope. EXAM: CHEST - 2 VIEW COMPARISON:  Radiographs of November 11, 2016. FINDINGS: Stable cardiomegaly. Both lungs are clear. No pneumothorax or pleural effusion is noted. The visualized skeletal structures are unremarkable. IMPRESSION: No active cardiopulmonary disease. Electronically Signed   By: Marijo Conception, M.D.   On: 09/10/2017 20:38   Ct Head Wo Contrast  Result Date: 09/10/2017 CLINICAL DATA:  Initial evaluation for seizure. EXAM: CT HEAD WITHOUT CONTRAST TECHNIQUE: Contiguous axial images were obtained from the base of the skull through the vertex without intravenous contrast. COMPARISON:  Prior CT from 09/07/2016. FINDINGS: Brain: Moderately advanced cerebral atrophy with chronic small vessel ischemic disease, progressed relative to previous. Remote lacunar infarct present at the left caudate. Remote bilateral parietooccipital infarctions, left greater than right with associated encephalomalacia and gliosis. 3.2 cm meningioma overlies the right frontal convexity without significant mass effect, similar to previous. No acute intracranial hemorrhage. No acute large vessel territory infarct. No other mass lesion. No mass  effect or midline shift. No hydrocephalus. No extra-axial fluid collection. Vascular: No hyperdense vessel. Calcified atherosclerosis at the skull base. Skull: Scalp soft tissues and calvarium within normal limits. Sinuses/Orbits: Globes and orbital soft tissues within normal limits. Paranasal sinuses are clear. No mastoid effusion. Other: None. IMPRESSION: 1. No acute intracranial abnormality. 2. Chronic bilateral parieto-occipital infarcts, left greater than right. 3. 3.2 cm meningioma overlying the right frontal convexity without associated mass effect. 4. Moderately advanced cerebral atrophy with chronic small vessel ischemic disease, mildly progressed  from previous. Electronically Signed   By: Jeannine Boga M.D.   On: 09/10/2017 20:37   Mr Brain Wo Contrast  Result Date: 09/11/2017 CLINICAL DATA:  Syncope.  Possible seizure. EXAM: MRI HEAD WITHOUT CONTRAST TECHNIQUE: Multiplanar, multiecho pulse sequences of the brain and surrounding structures were obtained without intravenous contrast. COMPARISON:  Head CT 09/10/2017 Brain MRI 09/08/2016 FINDINGS: BRAIN: There is no acute infarct, acute hemorrhage or mass effect. The midline structures are normal. There are multiple old cerebellar, left basal ganglia, right occipital and left parieto-occipital infarcts. Occipital Multifocal white matter hyperintensity, most commonly due to chronic ischemic microangiopathy. Advanced atrophy for age. Susceptibility-sensitive sequences show no chronic microhemorrhage or superficial siderosis. Right frontal convexity meningioma measures 3.5 cm at its dural base. VASCULAR: Major intracranial arterial and venous sinus flow voids are preserved. SKULL AND UPPER CERVICAL SPINE: The visualized skull base, calvarium, upper cervical spine and extracranial soft tissues are normal. SINUSES/ORBITS: Right maxillary retention cyst. No fluid levels or advanced mucosal thickening. No mastoid or middle ear effusion. The orbits are normal. IMPRESSION: Atrophy, chronic ischemic microangiopathy and multiple old infarcts without acute abnormality. Electronically Signed   By: Ulyses Jarred M.D.   On: 09/11/2017 05:17    EEG: Normal  Subjective: Feels well. Eating full meals, no further syncope or seizure. CBGs stabilized.  Discharge Exam: Vitals:   09/12/17 0738 09/12/17 1052  BP: 96/63 104/63  Pulse: 92 94  Resp: 16   Temp: 98 F (36.7 C) 98 F (36.7 C)  SpO2:     General: Pt is alert, awake, not in acute distress Cardiovascular: Irreg, rate in 80's, S1/S2 +, no rubs, no gallops Respiratory: CTA bilaterally, no wheezing, no rhonchi Abdominal: Soft, NT, ND, bowel sounds  + Extremities: Left BKA, no edema.   Labs: BNP (last 3 results) No results for input(s): BNP in the last 8760 hours. Basic Metabolic Panel: Recent Labs  Lab 09/10/17 2047 09/11/17 0130 09/11/17 0714 09/11/17 1225  NA 139  --  139  --   K 4.4  --  4.1  --   CL 95*  --  98  --   CO2 24  --  25  --   GLUCOSE 68*  --  96  --   BUN 33*  --  38*  --   CREATININE 6.53* 7.19* 7.22*  --   CALCIUM 8.7*  --  8.4*  --   MG  --  2.1  --   --   PHOS  --   --   --  6.1*   Liver Function Tests: Recent Labs  Lab 09/10/17 2047 09/11/17 0130  AST 51* 43*  ALT QUANTITY NOT SUFFICIENT, UNABLE TO PERFORM TEST 37  ALKPHOS 153* 143*  BILITOT QUANTITY NOT SUFFICIENT, UNABLE TO PERFORM TEST 0.8  PROT 7.6 7.4  ALBUMIN 3.6 3.6   No results for input(s): LIPASE, AMYLASE in the last 168 hours. No results for input(s): AMMONIA in the last 168 hours. CBC:  Recent Labs  Lab 09/10/17 2047 09/11/17 0130  WBC 8.6 7.8  NEUTROABS 6.2  --   HGB 12.2* 10.9*  HCT 39.1 36.3*  MCV 90.5 91.9  PLT 230 226   Cardiac Enzymes: Recent Labs  Lab 09/10/17 2047 09/11/17 0130 09/11/17 0714 09/11/17 1225  TROPONINI 0.05* 0.08* 0.14* 0.15*   BNP: Invalid input(s): POCBNP CBG: Recent Labs  Lab 09/12/17 0608 09/12/17 0704 09/12/17 0803 09/12/17 0855 09/12/17 1010  GLUCAP 95 97 75 143* 150*   D-Dimer No results for input(s): DDIMER in the last 72 hours. Hgb A1c Recent Labs    09/11/17 0130  HGBA1C 7.2*   Lipid Profile No results for input(s): CHOL, HDL, LDLCALC, TRIG, CHOLHDL, LDLDIRECT in the last 72 hours. Thyroid function studies Recent Labs    09/11/17 0130  TSH 1.551   Anemia work up No results for input(s): VITAMINB12, FOLATE, FERRITIN, TIBC, IRON, RETICCTPCT in the last 72 hours. Urinalysis    Component Value Date/Time   COLORURINE YELLOW 11/11/2016 1530   APPEARANCEUR HAZY (A) 11/11/2016 1530   LABSPEC 1.013 11/11/2016 1530   PHURINE 5.0 11/11/2016 1530   GLUCOSEU  NEGATIVE 11/11/2016 1530   HGBUR SMALL (A) 11/11/2016 1530   BILIRUBINUR NEGATIVE 11/11/2016 [place] 11/11/2016 1530   PROTEINUR 100 (A) 11/11/2016 1530   NITRITE NEGATIVE 11/11/2016 1530   LEUKOCYTESUR LARGE (A) 11/11/2016 1530    Microbiology Recent Results (from the past 240 hour(s))  MRSA PCR Screening     Status: Abnormal   Collection Time: 09/11/17  2:20 AM  Result Value Ref Range Status   MRSA by PCR POSITIVE (A) NEGATIVE Final    Comment:        The GeneXpert MRSA Assay (FDA approved for NASAL specimens only), is one component of a comprehensive MRSA colonization surveillance program. It is not intended to diagnose MRSA infection nor to guide or monitor treatment for MRSA infections. RESULT CALLED TO, READ BACK BY AND VERIFIED WITHLavonia Drafts RN 09/11/17 2505 JDW Performed at [facility], [place] [address], [place], [place] [address]     Time coordinating discharge: Approximately 40 minutes  Patrecia Pour, MD  [facility] 09/12/2017, 12:55 PM Pager [phone]

## 2017-09-12 NOTE — Progress Notes (Signed)
[name] ROUNDING NOTE   Subjective:   Interval History:  Patient comfortable  Ready to be discharged from the hospital and has no shortness of breath  No dizziness. EEG and MRI  Neurology consultation and patient continued on Lamictal 25 mg qhs. Seizure thought to be provoked by hypoglycemia an the glipizide was stopped.    Objective:  Vital signs in last 24 hours:  Temp:  [97.5 F (36.4 C)-98.2 F (36.8 C)] 98 F (36.7 C) (07/26 1052) Pulse Rate:  [18-94] 94 (07/26 1052) Resp:  [16-18] 16 (07/26 0738) BP: (89-114)/(49-68) 104/63 (07/26 1052) SpO2:  [98 %-100 %] 98 % (07/26 0323) Weight:  [167 lb 15.9 oz (76.2 kg)-174 lb 2.6 oz (79 kg)] 172 lb 9.9 oz (78.3 kg) (07/26 0323)  Weight change: -13.4 oz (-0.38 kg) Filed Weights   09/11/17 1415 09/11/17 1900 09/12/17 0323  Weight: 174 lb 2.6 oz (79 kg) 167 lb 15.9 oz (76.2 kg) 172 lb 9.9 oz (78.3 kg)    Intake/Output: I/O last 3 completed shifts: In: 950.4 [P.O.:240; I.V.:200; NG/GT:437; IV Piggyback:73.4] Out: 2857 [Other:2857]   Intake/Output this shift:  Total I/O In: 355 [P.O.:118; NG/GT:237] Out: -   CVS- RRR no rub or gallop  RS- Clear  ABD- BS present soft non-distended EXT- no edema L AKA R foot with dressed wound on lateral portion of foot    Basic Metabolic Panel: Recent Labs  Lab 09/10/17 2047 09/11/17 0130 09/11/17 0714 09/11/17 1225  NA 139  --  139  --   K 4.4  --  4.1  --   CL 95*  --  98  --   CO2 24  --  25  --   GLUCOSE 68*  --  96  --   BUN 33*  --  38*  --   CREATININE 6.53* 7.19* 7.22*  --   CALCIUM 8.7*  --  8.4*  --   MG  --  2.1  --   --   PHOS  --   --   --  6.1*    Liver Function Tests: Recent Labs  Lab 09/10/17 2047 09/11/17 0130  AST 51* 43*  ALT QUANTITY NOT SUFFICIENT, UNABLE TO PERFORM TEST 37  ALKPHOS 153* 143*  BILITOT QUANTITY NOT SUFFICIENT, UNABLE TO PERFORM TEST 0.8  PROT 7.6 7.4  ALBUMIN 3.6 3.6   No results for input(s): LIPASE, AMYLASE in the  last 168 hours. No results for input(s): AMMONIA in the last 168 hours.  CBC: Recent Labs  Lab 09/10/17 2047 09/11/17 0130  WBC 8.6 7.8  NEUTROABS 6.2  --   HGB 12.2* 10.9*  HCT 39.1 36.3*  MCV 90.5 91.9  PLT 230 226    Cardiac Enzymes: Recent Labs  Lab 09/10/17 2047 09/11/17 0130 09/11/17 0714 09/11/17 1225  TROPONINI 0.05* 0.08* 0.14* 0.15*    BNP: Invalid input(s): POCBNP  CBG: Recent Labs  Lab 09/12/17 0608 09/12/17 0704 09/12/17 0803 09/12/17 0855 09/12/17 1010  GLUCAP 95 97 75 143* 150*    Microbiology: Results for orders placed or performed during the hospital encounter of 09/10/17  MRSA PCR Screening     Status: Abnormal   Collection Time: 09/11/17  2:20 AM  Result Value Ref Range Status   MRSA by PCR POSITIVE (A) NEGATIVE Final    Comment:        The GeneXpert MRSA Assay (FDA approved for NASAL specimens only), is one component of a comprehensive MRSA colonization surveillance program. It is  not intended to diagnose MRSA infection nor to guide or monitor treatment for MRSA infections. RESULT CALLED TO, READ BACK BY AND VERIFIED WITHLavonia Drafts RN 09/11/17 7741 JDW Performed at [facility], [place] [address] [place], [place] [address]     Coagulation Studies: Recent Labs    09/10/17 2254  LABPROT 14.5  INR 1.14    Urinalysis: No results for input(s): COLORURINE, LABSPEC, PHURINE, GLUCOSEU, HGBUR, BILIRUBINUR, KETONESUR, PROTEINUR, UROBILINOGEN, NITRITE, LEUKOCYTESUR in the last 72 hours.  Invalid input(s): APPERANCEUR    Imaging: Dg Chest 2 View  Result Date: 09/10/2017 CLINICAL DATA:  Syncope. EXAM: CHEST - 2 VIEW COMPARISON:  Radiographs of November 11, 2016. FINDINGS: Stable cardiomegaly. Both lungs are clear. No pneumothorax or pleural effusion is noted. The visualized skeletal structures are unremarkable. IMPRESSION: No active cardiopulmonary disease. Electronically Signed   By: Marijo Conception, M.D.   On: 09/10/2017  20:38   Ct Head Wo Contrast  Result Date: 09/10/2017 CLINICAL DATA:  Initial evaluation for seizure. EXAM: CT HEAD WITHOUT CONTRAST TECHNIQUE: Contiguous axial images were obtained from the base of the skull through the vertex without intravenous contrast. COMPARISON:  Prior CT from 09/07/2016. FINDINGS: Brain: Moderately advanced cerebral atrophy with chronic small vessel ischemic disease, progressed relative to previous. Remote lacunar infarct present at the left caudate. Remote bilateral parietooccipital infarctions, left greater than right with associated encephalomalacia and gliosis. 3.2 cm meningioma overlies the right frontal convexity without significant mass effect, similar to previous. No acute intracranial hemorrhage. No acute large vessel territory infarct. No other mass lesion. No mass effect or midline shift. No hydrocephalus. No extra-axial fluid collection. Vascular: No hyperdense vessel. Calcified atherosclerosis at the skull base. Skull: Scalp soft tissues and calvarium within normal limits. Sinuses/Orbits: Globes and orbital soft tissues within normal limits. Paranasal sinuses are clear. No mastoid effusion. Other: None. IMPRESSION: 1. No acute intracranial abnormality. 2. Chronic bilateral parieto-occipital infarcts, left greater than right. 3. 3.2 cm meningioma overlying the right frontal convexity without associated mass effect. 4. Moderately advanced cerebral atrophy with chronic small vessel ischemic disease, mildly progressed from previous. Electronically Signed   By: Jeannine Boga M.D.   On: 09/10/2017 20:37   Mr Brain Wo Contrast  Result Date: 09/11/2017 CLINICAL DATA:  Syncope.  Possible seizure. EXAM: MRI HEAD WITHOUT CONTRAST TECHNIQUE: Multiplanar, multiecho pulse sequences of the brain and surrounding structures were obtained without intravenous contrast. COMPARISON:  Head CT 09/10/2017 Brain MRI 09/08/2016 FINDINGS: BRAIN: There is no acute infarct, acute hemorrhage or  mass effect. The midline structures are normal. There are multiple old cerebellar, left basal ganglia, right occipital and left parieto-occipital infarcts. Occipital Multifocal white matter hyperintensity, most commonly due to chronic ischemic microangiopathy. Advanced atrophy for age. Susceptibility-sensitive sequences show no chronic microhemorrhage or superficial siderosis. Right frontal convexity meningioma measures 3.5 cm at its dural base. VASCULAR: Major intracranial arterial and venous sinus flow voids are preserved. SKULL AND UPPER CERVICAL SPINE: The visualized skull base, calvarium, upper cervical spine and extracranial soft tissues are normal. SINUSES/ORBITS: Right maxillary retention cyst. No fluid levels or advanced mucosal thickening. No mastoid or middle ear effusion. The orbits are normal. IMPRESSION: Atrophy, chronic ischemic microangiopathy and multiple old infarcts without acute abnormality. Electronically Signed   By: Ulyses Jarred M.D.   On: 09/11/2017 05:17     Medications:   . meropenem (MERREM) IV 500 mg (09/11/17 2218)   . aspirin  81 mg Oral Daily  . atorvastatin  80 mg Oral  q1800  . Chlorhexidine Gluconate Cloth  6 each Topical Q0600  . doxercalciferol  2.5 mcg Intravenous Q T,Th,Sa-HD  . feeding supplement (NEPRO CARB STEADY)  237 mL Oral BID BM  . heparin  5,000 Units Subcutaneous Q8H  . isosorbide mononitrate  30 mg Oral QPM  . lamoTRIgine  25 mg Oral QHS  . metoprolol tartrate  25 mg Oral BID  . multivitamin  1 tablet Oral Daily   acetaminophen **OR** acetaminophen, nitroGLYCERIN, ondansetron **OR** ondansetron (ZOFRAN) IV  Assessment/ Plan:   Syncope spell  Seen by neurology  MRI and EEG were read and patient seen by neurology   Thought to be related to hypoglycemia  Blood sugars q 1 hour showed good control with no episodes of hypoglycemia    HD  TTS [facility]  ( dialysis yesterday 2857 cc removed ) weight 76.2 Kg     Hypertension appears to have  well controlled blood pressure uses metoprolol 25 mg BID will need to carefully monitor BP due to use of this medication   At this time I see no reason to change dose   Anemia  Stable and above 10 no ESA given on dialysis   Metabolic Bone Disease  Phos increased to 6.2  Needs binder recommend phoslo 667 mg 2 with meals   Chronic atrial fibrillation  No anticoagulation due to episode of GI bleeding  Right foot would no signs of active infection continue with wound care  Continues on meropenum as outpatient   Hypoglycemic seizure  Glucose levels stable    LOS: 0 Lynzee Lindquist W @TODAY @11 :50 AM

## 2017-09-12 NOTE — Care Management Obs Status (Signed)
[facility] NOTIFICATION   Patient Details  Name: George Burgess MRN: [id number] Date of Birth: April 27, 1950   Medicare Observation Status Notification Given:  Yes    Pollie Friar, RN 09/12/2017, 10:24 AM

## 2017-09-12 NOTE — Progress Notes (Addendum)
Discharge to: [facility] Anticipated discharge date: 09/12/17 Transportation by: [facility]  Report #: [id number], Room 206B  [place] signing off.  Laveda Abbe LCSW [phone]

## 2017-09-12 NOTE — Clinical Social Work Note (Signed)
Clinical Social Work Assessment  Patient Details  Name: George Burgess MRN: [id number] Date of Birth: November 10, 1950  Date of referral:  09/12/17               Reason for consult:  Facility Placement                Permission sought to share information with:  Chartered certified accountant granted to share information::  Yes, Verbal Permission Granted  Name::        Agency::  [facility]  Relationship::     Contact Information:     Housing/Transportation Living arrangements for the past 2 months:  [place] of Information:  Patient, Medical Team Patient Interpreter Needed:  None Criminal Activity/Legal Involvement Pertinent to Current Situation/Hospitalization:  No - Comment as needed Significant Relationships:  None Lives with:  Self, Facility Resident Do you feel safe going back to the place where you live?  Yes Need for family participation in patient care:  No (Coment)  Care giving concerns:  Patient from [facility] where he resides and has no concerns about care received.   Social Worker assessment / plan:  CSW confirmed plan to return to [facility].  Employment status:  Retired Forensic scientist:  Medicare PT Recommendations:  Not assessed at this time [place] / Referral to community resources:  [place]  Patient/Family's Response to care:  Patient agreeable to return to SNF.  Patient/Family's Understanding of and Emotional Response to Diagnosis, Current Treatment, and Prognosis:  Patient appreciative of hospital care and ready to return home.  Emotional Assessment Appearance:  Appears stated age Attitude/Demeanor/Rapport:  Engaged Affect (typically observed):  Pleasant Orientation:  Oriented to Self, Oriented to Place, Oriented to  Time, Oriented to Situation Alcohol / Substance use:  Not Applicable Psych involvement (Current and /or in the community):  No (Comment)  Discharge Needs  Concerns to be addressed:  Care  Coordination Readmission within the last 30 days:  No Current discharge risk:  None Barriers to Discharge:  No Barriers Identified   Geralynn Ochs, LCSW 09/12/2017, 10:30 AM

## 2017-09-12 NOTE — Plan of Care (Signed)
Pt stable; ready for discharge back to [facility].

## 2017-09-12 NOTE — Care Management Note (Signed)
Case Management Note  Patient Details  Name: George Burgess MRN: [id number] Date of Birth: 03-29-1950  Subjective/Objective:     Pt in with syncope from [facility].                Action/Plan: Pt is discharging back to [facility] today. CM signing off.   Expected Discharge Date:  09/12/17               Expected Discharge Plan:  Skilled Nursing Facility  In-House Referral:  Clinical Social Work  Discharge planning Services     Post Acute Care Choice:    Choice offered to:     DME Arranged:    DME Agency:     HH Arranged:    [place] Agency:     Status of Service:  Completed, signed off  If discussed at [employer], dates discussed:    Additional Comments:  Pollie Friar, RN 09/12/2017, 10:30 AM

## 2018-03-19 ENCOUNTER — Emergency Department (HOSPITAL_COMMUNITY): Payer: Medicare Other

## 2018-03-19 ENCOUNTER — Other Ambulatory Visit: Payer: Self-pay

## 2018-03-19 ENCOUNTER — Inpatient Hospital Stay (HOSPITAL_COMMUNITY)
Admission: EM | Admit: 2018-03-19 | Discharge: 2018-03-21 | DRG: 100 | Disposition: A | Discharge status: Skilled Nursing Facility | Payer: Medicare Other | Source: Skilled Nursing Facility | Attending: Family Medicine | Admitting: Family Medicine

## 2018-03-19 DIAGNOSIS — Z87891 Personal history of nicotine dependence: Secondary | ICD-10-CM

## 2018-03-19 DIAGNOSIS — N189 Chronic kidney disease, unspecified: Secondary | ICD-10-CM | POA: Diagnosis present

## 2018-03-19 DIAGNOSIS — I639 Cerebral infarction, unspecified: Secondary | ICD-10-CM | POA: Diagnosis present

## 2018-03-19 DIAGNOSIS — Z89512 Acquired absence of left leg below knee: Secondary | ICD-10-CM

## 2018-03-19 DIAGNOSIS — D631 Anemia in chronic kidney disease: Secondary | ICD-10-CM | POA: Diagnosis present

## 2018-03-19 DIAGNOSIS — Z7982 Long term (current) use of aspirin: Secondary | ICD-10-CM

## 2018-03-19 DIAGNOSIS — N186 End stage renal disease: Secondary | ICD-10-CM

## 2018-03-19 DIAGNOSIS — Z7901 Long term (current) use of anticoagulants: Secondary | ICD-10-CM

## 2018-03-19 DIAGNOSIS — I132 Hypertensive heart and chronic kidney disease with heart failure and with stage 5 chronic kidney disease, or end stage renal disease: Secondary | ICD-10-CM | POA: Diagnosis present

## 2018-03-19 DIAGNOSIS — R569 Unspecified convulsions: Secondary | ICD-10-CM

## 2018-03-19 DIAGNOSIS — G40401 Other generalized epilepsy and epileptic syndromes, not intractable, with status epilepticus: Principal | ICD-10-CM | POA: Diagnosis present

## 2018-03-19 DIAGNOSIS — I482 Chronic atrial fibrillation, unspecified: Secondary | ICD-10-CM | POA: Diagnosis present

## 2018-03-19 DIAGNOSIS — E1122 Type 2 diabetes mellitus with diabetic chronic kidney disease: Secondary | ICD-10-CM | POA: Diagnosis present

## 2018-03-19 DIAGNOSIS — G8384 Todd's paralysis (postepileptic): Secondary | ICD-10-CM | POA: Diagnosis present

## 2018-03-19 DIAGNOSIS — E1151 Type 2 diabetes mellitus with diabetic peripheral angiopathy without gangrene: Secondary | ICD-10-CM | POA: Diagnosis present

## 2018-03-19 DIAGNOSIS — I5042 Chronic combined systolic (congestive) and diastolic (congestive) heart failure: Secondary | ICD-10-CM | POA: Diagnosis present

## 2018-03-19 DIAGNOSIS — Z8673 Personal history of transient ischemic attack (TIA), and cerebral infarction without residual deficits: Secondary | ICD-10-CM | POA: Diagnosis present

## 2018-03-19 DIAGNOSIS — Z86011 Personal history of benign neoplasm of the brain: Secondary | ICD-10-CM

## 2018-03-19 DIAGNOSIS — Z992 Dependence on renal dialysis: Secondary | ICD-10-CM

## 2018-03-19 DIAGNOSIS — G40901 Epilepsy, unspecified, not intractable, with status epilepticus: Secondary | ICD-10-CM

## 2018-03-19 DIAGNOSIS — N2581 Secondary hyperparathyroidism of renal origin: Secondary | ICD-10-CM | POA: Diagnosis present

## 2018-03-19 DIAGNOSIS — I10 Essential (primary) hypertension: Secondary | ICD-10-CM | POA: Diagnosis present

## 2018-03-19 DIAGNOSIS — F0390 Unspecified dementia without behavioral disturbance: Secondary | ICD-10-CM | POA: Diagnosis present

## 2018-03-19 LAB — POCT I-STAT 7, (LYTES, BLD GAS, ICA,H+H)
Acid-Base Excess: 3 mmol/L — ABNORMAL HIGH (ref 0.0–2.0)
Bicarbonate: 28 mmol/L (ref 20.0–28.0)
Calcium, Ion: 1.13 mmol/L — ABNORMAL LOW (ref 1.15–1.40)
HCT: 40 % (ref 39.0–52.0)
Hemoglobin: 13.6 g/dL (ref 13.0–17.0)
O2 Saturation: 95 %
Patient temperature: 98.4
Potassium: 3.9 mmol/L (ref 3.5–5.1)
Sodium: 135 mmol/L (ref 135–145)
TCO2: 29 mmol/L (ref 22–32)
pCO2 arterial: 45.6 mmHg (ref 32.0–48.0)
pH, Arterial: 7.396 (ref 7.350–7.450)
pO2, Arterial: 76 mmHg — ABNORMAL LOW (ref 83.0–108.0)

## 2018-03-19 LAB — COMPREHENSIVE METABOLIC PANEL
ALT: 32 U/L (ref 0–44)
AST: 35 U/L (ref 15–41)
Albumin: 3.7 g/dL (ref 3.5–5.0)
Alkaline Phosphatase: 135 U/L — ABNORMAL HIGH (ref 38–126)
Anion gap: 14 (ref 5–15)
BUN: 17 mg/dL (ref 8–23)
CO2: 23 mmol/L (ref 22–32)
Calcium: 8.7 mg/dL — ABNORMAL LOW (ref 8.9–10.3)
Chloride: 98 mmol/L (ref 98–111)
Creatinine, Ser: 3.95 mg/dL — ABNORMAL HIGH (ref 0.61–1.24)
GFR calc Af Amer: 17 mL/min — ABNORMAL LOW (ref >60)
GFR calc non Af Amer: 15 mL/min — ABNORMAL LOW (ref >60)
Glucose, Bld: 203 mg/dL — ABNORMAL HIGH (ref 70–99)
Potassium: 3.9 mmol/L (ref 3.5–5.1)
Sodium: 135 mmol/L (ref 135–145)
Total Bilirubin: 1.8 mg/dL — ABNORMAL HIGH (ref 0.3–1.2)
Total Protein: 7 g/dL (ref 6.5–8.1)

## 2018-03-19 LAB — CBC
HCT: 41.4 % (ref 39.0–52.0)
Hemoglobin: 12.6 g/dL — ABNORMAL LOW (ref 13.0–17.0)
MCH: 27.5 pg (ref 26.0–34.0)
MCHC: 30.4 g/dL (ref 30.0–36.0)
MCV: 90.4 fL (ref 80.0–100.0)
Platelets: UNDETERMINED 10*3/uL (ref 150–400)
RBC: 4.58 MIL/uL (ref 4.22–5.81)
RDW: 19.7 % — AB (ref 11.5–15.5)
WBC: 8.8 10*3/uL (ref 4.0–10.5)
nRBC: 0 % (ref 0.0–0.2)

## 2018-03-19 LAB — MAGNESIUM: Magnesium: 1.7 mg/dL (ref 1.7–2.4)

## 2018-03-19 LAB — ETHANOL

## 2018-03-19 LAB — CBG MONITORING, ED: Glucose-Capillary: 203 mg/dL — ABNORMAL HIGH (ref 70–99)

## 2018-03-19 LAB — PHOSPHORUS: Phosphorus: 2.2 mg/dL — ABNORMAL LOW (ref 2.5–4.6)

## 2018-03-19 MED ORDER — SODIUM CHLORIDE 0.9 % IV SOLN
2000.0000 mg | Freq: Once | INTRAVENOUS | Status: AC
  Administered 2018-03-19: 2000 mg via INTRAVENOUS
  Filled 2018-03-19: qty 20

## 2018-03-19 MED ORDER — PROPOFOL BOLUS VIA INFUSION
1.0000 mg/kg | Freq: Once | INTRAVENOUS | Status: DC
  Filled 2018-03-19: qty 80

## 2018-03-19 MED ORDER — PROPOFOL 1000 MG/100ML IV EMUL: 20.0000 ug/kg/min | INTRAVENOUS | Status: DC

## 2018-03-19 NOTE — Consult Note (Signed)
Requesting Physician: Dr. Kathrynn Humble    Chief Complaint: Seizing for 20 minutes  History obtained from: Chart    HPI:                                                                                                                                       George Burgess is an 68 y.o. male asked medical history of atrial fibrillation on Eliquis, heart failure, stroke, dementia, end-stage renal disease, type 2 diabetes mellitus, left below the knee amputation presents to the emergency room after patient was having right-sided shaking  at [place].  When EMS arrived patient was having right sided jerking which later turned into generalized tonic-clonic seizures.  She was given 5 mg of Versed and arrives unresponsive to the emergency room.  Patient was about to be intubated however started becoming more awake.  On my assessment patient was awake and following commands.  Stat CT head was performed which showed no acute findings, redemonstrating his old bilateral PCA infarcts as well as old posterior left parietal infarct.  Also stable right frontal meningioma.  Patient was being loaded with 2 g of IV Keppra.  Past Medical History:  Diagnosis Date  . Atrial fibrillation ([place])    a. Chronic Eliquis (CHA2DS2VASc = 6).  . Chronic combined systolic (congestive) and diastolic (congestive) heart failure ([place])    a. Previously reported EF of 30%;  b. 08/2016 Echo: EF 40-45%, antsept, inf, infsept HK, Gr3 DD, mod AI/MR, sev dil LA, mild TR, PASP 48mmHg.  Marland Kitchen Dementia   . Encephalopathy   . End stage renal disease ([place])    a. On HD.  Marland Kitchen Essential hypertension   . GIB (gastrointestinal bleeding)    a. 08/2016 in setting of Eliquis Rx.  . Pleural effusion   . PVD (peripheral vascular disease) ([place])    a. s/p L BKA;  b. Chronic LE wounds.  . Stroke ([place])   . Type II diabetes mellitus ([place])     Past Surgical History:  Procedure Laterality Date  . BELOW KNEE LEG AMPUTATION Left   . FLEXIBLE  SIGMOIDOSCOPY N/A 09/07/2016   Procedure: FLEXIBLE SIGMOIDOSCOPY;  Surgeon: Manus Gunning, MD;  Location: [facility];  Service: Gastroenterology;  Laterality: N/A;    Family History  Problem Relation Age of Onset  . Other Mother        Pt unsure of PMH of family members.   Social History:  reports that he has quit smoking. He has never used smokeless tobacco. He reports that he does not drink alcohol or use drugs.  Allergies: No Known Allergies  Medications:  I reviewed home medications   ROS:                                                                                                                                     14 systems reviewed and negative except above    Examination:                                                                                                      General: Appears well-developed and well-nourished.  Psych: Affect appropriate to situation Eyes: No scleral injection HENT: No OP obstrucion Head: Normocephalic.  Cardiovascular: Normal rate and regular rhythm.  Respiratory: Effort normal and breath sounds normal to anterior ascultation GI: Soft.  No distension. There is no tenderness.  Skin: WDI    Neurological Examination Mental Status: Drowsy but arousable, states his name.  Speech is slurred, following simple commands. Cranial Nerves: II: Visual fields: Blink to threat bilaterally III,IV, VI: No forced gaze deviation, nystagmus seen V,VII: Mild right facial droop Motor: Right : Upper extremity   4/5    Left:     Upper extremity   4/5  Lower extremity   1/5     Lower extremity  BKA Tone and bulk:normal tone throughout; no atrophy noted Sensory: Unable to assess accurately as patient was drowsy Plantars: Right: downgoing/mute    Cerebellar: Unable to assess as patient was drowsy Gait: Unable to assess      Lab Results: Basic Metabolic Panel: No results for input(s): NA, K, CL, CO2, GLUCOSE, BUN, CREATININE, CALCIUM, MG, PHOS in the last 168 hours.  CBC: No results for input(s): WBC, NEUTROABS, HGB, HCT, MCV, PLT in the last 168 hours.  Coagulation Studies: No results for input(s): LABPROT, INR in the last 72 hours.  Imaging: No results found.   I have reviewed the above imaging : Reviewed head CT, shows bilateral PCA stroke and old left parietal lobe infarct.   ASSESSMENT AND PLAN  68 y.o. 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    Patient initially noted to have right upper extremity and lower extremity jerking, likely from old left parietal infarct which then progressed to generalized tonic-clonic seizures.  Resolved after receiving Versed.  Currently patient is awake and following commands intermittently.  Appears to be weaker on the right side likely from Todd's paresis.  Status Epilepticus- resolved Todd's paralysis -Repeat  CT head -Continue Keppra 500 mg twice daily -Seizure precautions  George Burgess [facility] Pager Number [phone]

## 2018-03-19 NOTE — ED Triage Notes (Signed)
Per ems they were called out tonight to [facility] for seizures. Pt was found in the chair slumped over seizing with vomit all over him. Pt was given 5mg  IM injection and began to seize again with ems. Never fully stopped seizing body kept moving the entire time. Full blown seizure again with ems with total of 25 mins seizing. Hx dementia. Dialysis on Tues, thurs, Sat. Brought in unconscious and not responding. 125/73, 84 p, 22, 100% ra cbg 251

## 2018-03-20 ENCOUNTER — Inpatient Hospital Stay (HOSPITAL_COMMUNITY): Payer: Medicare Other

## 2018-03-20 ENCOUNTER — Encounter (HOSPITAL_COMMUNITY): Payer: Self-pay | Admitting: Family Medicine

## 2018-03-20 DIAGNOSIS — N2581 Secondary hyperparathyroidism of renal origin: Secondary | ICD-10-CM | POA: Diagnosis present

## 2018-03-20 DIAGNOSIS — Z8673 Personal history of transient ischemic attack (TIA), and cerebral infarction without residual deficits: Secondary | ICD-10-CM | POA: Diagnosis not present

## 2018-03-20 DIAGNOSIS — G40901 Epilepsy, unspecified, not intractable, with status epilepticus: Secondary | ICD-10-CM | POA: Diagnosis present

## 2018-03-20 DIAGNOSIS — N186 End stage renal disease: Secondary | ICD-10-CM

## 2018-03-20 DIAGNOSIS — E1151 Type 2 diabetes mellitus with diabetic peripheral angiopathy without gangrene: Secondary | ICD-10-CM | POA: Diagnosis present

## 2018-03-20 DIAGNOSIS — D631 Anemia in chronic kidney disease: Secondary | ICD-10-CM | POA: Diagnosis present

## 2018-03-20 DIAGNOSIS — I5042 Chronic combined systolic (congestive) and diastolic (congestive) heart failure: Secondary | ICD-10-CM | POA: Diagnosis present

## 2018-03-20 DIAGNOSIS — I482 Chronic atrial fibrillation, unspecified: Secondary | ICD-10-CM | POA: Diagnosis present

## 2018-03-20 DIAGNOSIS — Z992 Dependence on renal dialysis: Secondary | ICD-10-CM | POA: Diagnosis not present

## 2018-03-20 DIAGNOSIS — Z7901 Long term (current) use of anticoagulants: Secondary | ICD-10-CM | POA: Diagnosis not present

## 2018-03-20 DIAGNOSIS — Z86011 Personal history of benign neoplasm of the brain: Secondary | ICD-10-CM | POA: Diagnosis not present

## 2018-03-20 DIAGNOSIS — F0390 Unspecified dementia without behavioral disturbance: Secondary | ICD-10-CM | POA: Diagnosis present

## 2018-03-20 DIAGNOSIS — G8384 Todd's paralysis (postepileptic): Secondary | ICD-10-CM | POA: Diagnosis present

## 2018-03-20 DIAGNOSIS — G40401 Other generalized epilepsy and epileptic syndromes, not intractable, with status epilepticus: Secondary | ICD-10-CM | POA: Diagnosis present

## 2018-03-20 DIAGNOSIS — Z87891 Personal history of nicotine dependence: Secondary | ICD-10-CM | POA: Diagnosis not present

## 2018-03-20 DIAGNOSIS — Z89512 Acquired absence of left leg below knee: Secondary | ICD-10-CM | POA: Diagnosis not present

## 2018-03-20 DIAGNOSIS — R569 Unspecified convulsions: Secondary | ICD-10-CM | POA: Diagnosis not present

## 2018-03-20 DIAGNOSIS — I739 Peripheral vascular disease, unspecified: Secondary | ICD-10-CM | POA: Insufficient documentation

## 2018-03-20 DIAGNOSIS — E1122 Type 2 diabetes mellitus with diabetic chronic kidney disease: Secondary | ICD-10-CM | POA: Diagnosis present

## 2018-03-20 DIAGNOSIS — I132 Hypertensive heart and chronic kidney disease with heart failure and with stage 5 chronic kidney disease, or end stage renal disease: Secondary | ICD-10-CM | POA: Diagnosis present

## 2018-03-20 DIAGNOSIS — I639 Cerebral infarction, unspecified: Secondary | ICD-10-CM | POA: Diagnosis present

## 2018-03-20 DIAGNOSIS — Z7982 Long term (current) use of aspirin: Secondary | ICD-10-CM | POA: Diagnosis not present

## 2018-03-20 LAB — MRSA PCR SCREENING: MRSA by PCR: POSITIVE — AB

## 2018-03-20 LAB — GLUCOSE, CAPILLARY: Glucose-Capillary: 103 mg/dL — ABNORMAL HIGH (ref 70–99)

## 2018-03-20 MED ORDER — SODIUM CHLORIDE 0.9 % IV SOLN: 250.0000 mL | INTRAVENOUS | Status: DC | PRN

## 2018-03-20 MED ORDER — CHLORHEXIDINE GLUCONATE CLOTH 2 % EX PADS
6.0000 | MEDICATED_PAD | Freq: Every day | CUTANEOUS | Status: DC
  Administered 2018-03-20: 6 via TOPICAL

## 2018-03-20 MED ORDER — SODIUM CHLORIDE 0.9% FLUSH: 3.0000 mL | INTRAVENOUS | Status: DC | PRN

## 2018-03-20 MED ORDER — ONDANSETRON HCL 4 MG/2ML IJ SOLN: 4.0000 mg | Freq: Four times a day (QID) | INTRAMUSCULAR | Status: DC | PRN

## 2018-03-20 MED ORDER — FAMOTIDINE 20 MG PO TABS
20.0000 mg | ORAL_TABLET | Freq: Every day | ORAL | Status: DC
  Administered 2018-03-20 – 2018-03-21 (×2): 20 mg via ORAL
  Filled 2018-03-20 (×2): qty 1

## 2018-03-20 MED ORDER — CHLORHEXIDINE GLUCONATE CLOTH 2 % EX PADS
6.0000 | MEDICATED_PAD | Freq: Every day | CUTANEOUS | Status: DC
  Administered 2018-03-21: 6 via TOPICAL

## 2018-03-20 MED ORDER — SODIUM CHLORIDE 0.9% FLUSH
3.0000 mL | Freq: Two times a day (BID) | INTRAVENOUS | Status: DC
  Administered 2018-03-20 (×2): 3 mL via INTRAVENOUS

## 2018-03-20 MED ORDER — ISOSORBIDE MONONITRATE ER 30 MG PO TB24
30.0000 mg | ORAL_TABLET | Freq: Every evening | ORAL | Status: DC
  Administered 2018-03-20: 30 mg via ORAL
  Filled 2018-03-20: qty 1

## 2018-03-20 MED ORDER — METOPROLOL TARTRATE 25 MG PO TABS
25.0000 mg | ORAL_TABLET | Freq: Two times a day (BID) | ORAL | Status: DC
  Administered 2018-03-20 – 2018-03-21 (×3): 25 mg via ORAL
  Filled 2018-03-20 (×3): qty 1

## 2018-03-20 MED ORDER — ASPIRIN 81 MG PO CHEW
81.0000 mg | CHEWABLE_TABLET | Freq: Every day | ORAL | Status: DC
  Administered 2018-03-20 – 2018-03-21 (×2): 81 mg via ORAL
  Filled 2018-03-20 (×2): qty 1

## 2018-03-20 MED ORDER — ATORVASTATIN CALCIUM 80 MG PO TABS
80.0000 mg | ORAL_TABLET | Freq: Every day | ORAL | Status: DC
  Administered 2018-03-20: 80 mg via ORAL
  Filled 2018-03-20: qty 1

## 2018-03-20 MED ORDER — ONDANSETRON HCL 4 MG PO TABS: 4.0000 mg | ORAL_TABLET | Freq: Four times a day (QID) | ORAL | Status: DC | PRN

## 2018-03-20 MED ORDER — LEVETIRACETAM 500 MG PO TABS
500.0000 mg | ORAL_TABLET | Freq: Two times a day (BID) | ORAL | Status: DC
  Administered 2018-03-20 – 2018-03-21 (×3): 500 mg via ORAL
  Filled 2018-03-20 (×3): qty 1

## 2018-03-20 MED ORDER — MUPIROCIN 2 % EX OINT
1.0000 "application " | TOPICAL_OINTMENT | Freq: Two times a day (BID) | CUTANEOUS | Status: DC
  Administered 2018-03-20 – 2018-03-21 (×3): 1 via NASAL
  Filled 2018-03-20: qty 22

## 2018-03-20 NOTE — Progress Notes (Addendum)
MRI completed. Stable right frontal meningioma and prior old strokes especially in the left parietal area.  No acute strokes.  Recommendations as before Please call with questions.  -- Amie Portland, MD [facility] Pager: [phone] If 7pm to 7am, please call on call as listed on AMION.

## 2018-03-20 NOTE — Consult Note (Signed)
Renal Service Consult Note [facility]  [place] 03/20/2018 Sol [place] Requesting Physician:  Dr Thereasa Solo  Reason for Consult:  ESRD pt w/ seizures needs HD tomorrow HPI: The patient is a 68 y.o. year-old with hx of CHF, dementia, HTN, DM2 and prior CVA and L BKA admitted yest for seizures.  Seizures under control now.  Asked to see for HD tomorrow.   Pt on HD w/ [facility] I believe, but pt's memory is very poor and he cannot confirm much of anything about his medical hx.  He does know HD on Tues, thurs Sat.  Has R IJ TDC.  No cough or SOB, no c/o.    ROS  denies CP  no joint pain   no HA  no blurry vision  no rash  no diarrhea  no nausea/ vomiting  Past Medical History  Past Medical History:  Diagnosis Date  . Atrial fibrillation ([place])    a. Chronic Eliquis (CHA2DS2VASc = 6).  . Chronic combined systolic (congestive) and diastolic (congestive) heart failure ([place])    a. Previously reported EF of 30%;  b. 08/2016 Echo: EF 40-45%, antsept, inf, infsept HK, Gr3 DD, mod AI/MR, sev dil LA, mild TR, PASP 52mmHg.  Marland Kitchen Dementia ([place])   . Encephalopathy   . End stage renal disease ([place])    a. On HD.  Marland Kitchen Essential hypertension   . GIB (gastrointestinal bleeding)    a. 08/2016 in setting of Eliquis Rx.  . Pleural effusion   . PVD (peripheral vascular disease) ([place])    a. s/p L BKA;  b. Chronic LE wounds.  . Stroke ([place])   . Type II diabetes mellitus ([place])    Past Surgical History  Past Surgical History:  Procedure Laterality Date  . BELOW KNEE LEG AMPUTATION Left   . FLEXIBLE SIGMOIDOSCOPY N/A 09/07/2016   Procedure: FLEXIBLE SIGMOIDOSCOPY;  Surgeon: Manus Gunning, MD;  Location: [facility];  Service: Gastroenterology;  Laterality: N/A;   Family History  Family History  Problem Relation Age of Onset  . Other Mother        Pt unsure of PMH of family members.   Social History  reports that he has quit smoking. He has never used smokeless  tobacco. He reports that he does not drink alcohol or use drugs. Allergies No Known Allergies Home medications Prior to Admission medications   Medication Sig Start Date End Date Taking? Authorizing Provider  acetaminophen (TYLENOL) 325 MG tablet Take 650 mg by mouth every 4 (four) hours as needed (for pain).    Yes [provider]  aspirin 81 MG tablet Chew 81 mg by mouth daily.   Yes [provider]  atorvastatin (LIPITOR) 80 MG tablet Take 1 tablet (80 mg total) by mouth daily at 6 PM. 09/13/16  Yes Mikhail, Velta Addison, DO  B Complex-C-Folic Acid (NEPHRO VITAMINS) 0.8 MG TABS Take 1 tablet by mouth daily.   Yes [provider]  calcium carbonate (TUMS EX) 750 MG chewable tablet Chew 1 tablet by mouth See admin instructions. Chew 1 tablet by mouth three times a day with meals and 1 tablet three times a day with snacks   Yes [provider]  ethyl chloride spray Apply 1 application topically Every Tuesday,Thursday,and Saturday with dialysis.   Yes [provider]  famotidine (PEPCID) 20 MG tablet Take 20 mg by mouth daily.   Yes [provider]  isosorbide mononitrate (IMDUR) 30 MG 24 hr tablet Take 30 mg  by mouth every evening.    Yes [provider]  lamoTRIgine (LAMICTAL) 25 MG tablet Take 25 mg by mouth at bedtime.    Yes [provider]  metoprolol tartrate (LOPRESSOR) 25 MG tablet Take 25 mg by mouth 2 (two) times daily.   Yes [provider]  nitroGLYCERIN (NITROSTAT) 0.4 MG SL tablet Place 0.4 mg under the tongue every 5 (five) minutes x 3 doses as needed for chest pain.    Yes [provider]  Nutritional Supplements (FEEDING SUPPLEMENT, NEPRO CARB STEADY,) LIQD Take 237 mLs by mouth 2 (two) times daily between meals.    Yes [provider]   Liver Function Tests Recent Labs  Lab 03/19/18 2213  AST 35  ALT 32  ALKPHOS 135*  BILITOT 1.8*  PROT 7.0  ALBUMIN 3.7   No results for  input(s): LIPASE, AMYLASE in the last 168 hours. CBC Recent Labs  Lab 03/19/18 2153 03/19/18 2213  WBC  --  8.8  HGB 13.6 12.6*  HCT 40.0 41.4  MCV  --  90.4  PLT  --  PLATELET CLUMPS NOTED ON SMEAR, UNABLE TO ESTIMATE   Basic Metabolic Panel Recent Labs  Lab 03/19/18 2153 03/19/18 2213  NA 135 135  K 3.9 3.9  CL  --  98  CO2  --  23  GLUCOSE  --  203*  BUN  --  17  CREATININE  --  3.95*  CALCIUM  --  8.7*  PHOS  --  2.2*   Iron/TIBC/Ferritin/ %Sat No results found for: IRON, TIBC, FERRITIN, IRONPCTSAT  Vitals:   03/20/18 0205 03/20/18 0900 03/20/18 1216 03/20/18 1542  BP: 124/72 128/88 (!) 155/89 (!) 166/84  Pulse: (!) 58 (!) 51 67 68  Resp: 18 18 20 18   Temp: 98.3 F (36.8 C) 97.7 F (36.5 C) 98.1 F (36.7 C) 97.7 F (36.5 C)  TempSrc: Oral Axillary Oral Oral  SpO2: 100% 99% 100% 100%  Weight:       Exam Gen alert, no distress, calm, disoirented No rash, cyanosis or gangrene Sclera anicteric, throat clear  No jvd or bruits, R IJ TDC w/ some fresh blood under dressing Chest clear bilat to bases RRR no MRG Abd soft ntnd no mass or ascites +bs GU normal male MS no joint effusions or deformity Ext no LE or UE edema, no wounds or ulcers, old AVG L arm L BKA Neuro is alert, Ox 1, nonfocal  Dialysis: [facility]     Assessment: 1. ESRD on HD - labs OK, no vol excess on exam. Plan HD Sat 2. Seizures/ status epilepticus - improved, per neuro 3. HTN on metoprolol only, cont 4. H/o CVA 5. PAD sp L BKA 6. Atrial fib chronic 7. Anemia ckd - Hb 12, get records    P: 1. HD tomorrow       Kelly Splinter MD [place] pager [phone]   03/20/2018, 4:21 PM

## 2018-03-20 NOTE — H&P (Signed)
History and Physical    George Burgess CWC:[id number] DOB: 1950-06-01 DOA: 03/19/2018  PCP: Caprice Renshaw, MD  Patient coming from: Nursing home  Chief Complaint: Seizure  HPI: George Burgess is a 68 y.o. male with medical history significant of A. fib, chronic systolic heart failure, dementia, hypertension, diabetes, history of seizure in the setting of hypoglycemia in the past not on any antiepileptics now brought in for a seizure that occurred at the nursing home.  He was found having a focal seizure at the nursing home and had vomited all over EMS was called.  EMS reports that his seizure then converted into a tonic-clonic generalized seizure all lasting about 25 minutes.  He was given Versed 5 mg with EMS and then given Keppra 2 g here in the emergency department.  He was on the verge of needing to be intubated when he started coming around and becoming more responsive.  He currently is still little postictal and confused.  At his baseline he has dementia but unclear how severe.  Patient denies any pain.  He is moving all of his extremities.  His right leg is wrapped and he does not know why this is wrapped.  Patient is being referred for admission for seizures.  According to Dr. Kathrynn Humble who spoke to the nursing home he had been in his previous normal state of health prior to this happening tonight.  Review of Systems: Unobtainable secondary to patient's dementia  Past Medical History:  Diagnosis Date  . Atrial fibrillation ([place])    a. Chronic Eliquis (CHA2DS2VASc = 6).  . Chronic combined systolic (congestive) and diastolic (congestive) heart failure ([place])    a. Previously reported EF of 30%;  b. 08/2016 Echo: EF 40-45%, antsept, inf, infsept HK, Gr3 DD, mod AI/MR, sev dil LA, mild TR, PASP 39mmHg.  Marland Kitchen Dementia ([place])   . Encephalopathy   . End stage renal disease ([place])    a. On HD.  Marland Kitchen Essential hypertension   . GIB (gastrointestinal bleeding)    a. 08/2016 in setting of Eliquis Rx.  .  Pleural effusion   . PVD (peripheral vascular disease) ([place])    a. s/p L BKA;  b. Chronic LE wounds.  . Stroke ([place])   . Type II diabetes mellitus ([place])     Past Surgical History:  Procedure Laterality Date  . BELOW KNEE LEG AMPUTATION Left   . FLEXIBLE SIGMOIDOSCOPY N/A 09/07/2016   Procedure: FLEXIBLE SIGMOIDOSCOPY;  Surgeon: Manus Gunning, MD;  Location: [facility];  Service: Gastroenterology;  Laterality: N/A;     reports that he has quit smoking. He has never used smokeless tobacco. He reports that he does not drink alcohol or use drugs.  No Known Allergies  Family History  Problem Relation Age of Onset  . Other Mother        Pt unsure of PMH of family members.    Prior to Admission medications   Medication Sig Start Date End Date Taking? Authorizing Provider  acetaminophen (TYLENOL) 325 MG tablet Take 650 mg by mouth every 4 (four) hours as needed (for pain).    Yes [provider]  aspirin 81 MG tablet Chew 81 mg by mouth daily.   Yes [provider]  atorvastatin (LIPITOR) 80 MG tablet Take 1 tablet (80 mg total) by mouth daily at 6 PM. 09/13/16  Yes Mikhail, Velta Addison, DO  B Complex-C-Folic Acid (NEPHRO VITAMINS) 0.8 MG TABS Take 1 tablet by mouth daily.   Yes [provider]  calcium carbonate (TUMS EX) 750 MG chewable tablet Chew 1 tablet by mouth See admin instructions. Chew 1 tablet by mouth three times a day with meals and 1 tablet three times a day with snacks   Yes [provider]  ethyl chloride spray Apply 1 application topically Every Tuesday,Thursday,and Saturday with dialysis.   Yes [provider]  famotidine (PEPCID) 20 MG tablet Take 20 mg by mouth daily.   Yes [provider]  isosorbide mononitrate (IMDUR) 30 MG 24 hr tablet Take 30 mg by mouth every evening.    Yes [provider]  lamoTRIgine (LAMICTAL) 25 MG tablet Take 25 mg by mouth at bedtime.    Yes [provider]    metoprolol tartrate (LOPRESSOR) 25 MG tablet Take 25 mg by mouth 2 (two) times daily.   Yes [provider]  nitroGLYCERIN (NITROSTAT) 0.4 MG SL tablet Place 0.4 mg under the tongue every 5 (five) minutes x 3 doses as needed for chest pain.    Yes [provider]  Nutritional Supplements (FEEDING SUPPLEMENT, NEPRO CARB STEADY,) LIQD Take 237 mLs by mouth 2 (two) times daily between meals.    Yes [provider]  ascorbic acid (VITAMIN C) 500 MG tablet Take 500 mg by mouth daily.    [provider]  collagenase (SANTYL) ointment Apply 1 application topically daily. To wound on right lateral food    [provider]  ertapenem [facility]) IVPB 500mg  IM daily. 2 doses remaining (on 7/26 and 7/27 following hemodialysis). Patient not taking: Reported on 03/19/2018 09/12/17   Patrecia Pour, MD  lidocaine-prilocaine (EMLA) cream Apply 1 application topically Every Tuesday,Thursday,and Saturday with dialysis. Thick layer to fistula site left arm then wrap with saran wrap prior to dialysis    [provider]    Physical Exam: Vitals:   03/19/18 2200 03/19/18 2330 03/20/18 0015 03/20/18 0030  BP: 138/79 (!) 152/84 125/74 (!) 142/77  Pulse: 73 72 (!) 59 71  Resp: (!) 29     Temp:      TempSrc:      SpO2: 97% 100% 98% 100%  Weight:          Constitutional: NAD, calm, comfortable arouses easily to voice Vitals:   03/19/18 2200 03/19/18 2330 03/20/18 0015 03/20/18 0030  BP: 138/79 (!) 152/84 125/74 (!) 142/77  Pulse: 73 72 (!) 59 71  Resp: (!) 29     Temp:      TempSrc:      SpO2: 97% 100% 98% 100%  Weight:       Eyes: PERRL, lids and conjunctivae normal ENMT: Mucous membranes are moist. Posterior pharynx clear of any exudate or lesions.Normal dentition.  Neck: normal, supple, no masses, no thyromegaly Respiratory: clear to auscultation bilaterally, no wheezing, no crackles. Normal respiratory effort. No accessory muscle use.  Cardiovascular:  Regular rate and rhythm, no murmurs / rubs / gallops. No extremity edema. 2+ pedal pulses. No carotid bruits.  Abdomen: no tenderness, no masses palpated. No hepatosplenomegaly. Bowel sounds positive.  Musculoskeletal: no clubbing / cyanosis. No joint deformity upper and lower extremities. Good ROM, no contractures. Normal muscle tone.  Except left amputee Skin: no rashes, lesions, ulcers. No induration except wound to right lateral foot that appears well-healing without any surrounding erythema Neurologic: CN 2-12 grossly intact. Sensation intact, DTR normal. Strength 5/5 in all 4.  Psychiatric: Not normal judgment and insight. Alert and oriented x 1. Normal mood.    Labs on Admission: I  have personally reviewed following labs and imaging studies  CBC: Recent Labs  Lab 03/19/18 2153 03/19/18 2213  WBC  --  8.8  HGB 13.6 12.6*  HCT 40.0 41.4  MCV  --  90.4  PLT  --  PLATELET CLUMPS NOTED ON SMEAR, UNABLE TO ESTIMATE   Basic Metabolic Panel: Recent Labs  Lab 03/19/18 2153 03/19/18 2213  NA 135 135  K 3.9 3.9  CL  --  98  CO2  --  23  GLUCOSE  --  203*  BUN  --  17  CREATININE  --  3.95*  CALCIUM  --  8.7*  MG  --  1.7  PHOS  --  2.2*   GFR: CrCl cannot be calculated (Unknown ideal weight.). Liver Function Tests: Recent Labs  Lab 03/19/18 2213  AST 35  ALT 32  ALKPHOS 135*  BILITOT 1.8*  PROT 7.0  ALBUMIN 3.7   No results for input(s): LIPASE, AMYLASE in the last 168 hours. No results for input(s): AMMONIA in the last 168 hours. Coagulation Profile: No results for input(s): INR, PROTIME in the last 168 hours. Cardiac Enzymes: No results for input(s): CKTOTAL, CKMB, CKMBINDEX, TROPONINI in the last 168 hours. BNP (last 3 results) No results for input(s): PROBNP in the last 8760 hours. HbA1C: No results for input(s): HGBA1C in the last 72 hours. CBG: Recent Labs  Lab 03/19/18 2131  GLUCAP 203*   Lipid Profile: No results for input(s): CHOL, HDL, LDLCALC,  TRIG, CHOLHDL, LDLDIRECT in the last 72 hours. Thyroid Function Tests: No results for input(s): TSH, T4TOTAL, FREET4, T3FREE, THYROIDAB in the last 72 hours. Anemia Panel: No results for input(s): VITAMINB12, FOLATE, FERRITIN, TIBC, IRON, RETICCTPCT in the last 72 hours. Urine analysis:    Component Value Date/Time   COLORURINE YELLOW 11/11/2016 1530   APPEARANCEUR HAZY (A) 11/11/2016 1530   LABSPEC 1.013 11/11/2016 1530   PHURINE 5.0 11/11/2016 1530   GLUCOSEU NEGATIVE 11/11/2016 1530   HGBUR SMALL (A) 11/11/2016 1530   BILIRUBINUR NEGATIVE 11/11/2016 [place] 11/11/2016 1530   PROTEINUR 100 (A) 11/11/2016 1530   NITRITE NEGATIVE 11/11/2016 1530   LEUKOCYTESUR LARGE (A) 11/11/2016 1530   Sepsis Labs: !!!!!!!!!!!!!!!!!!!!!!!!!!!!!!!!!!!!!!!!!!!! @LABRCNTIP (procalcitonin:4,lacticidven:4) )No results found for this or any previous visit (from the past 240 hour(s)).   Radiological Exams on Admission: Ct Head Wo Contrast  Result Date: 03/19/2018 CLINICAL DATA:  Seizure EXAM: CT HEAD WITHOUT CONTRAST TECHNIQUE: Contiguous axial images were obtained from the base of the skull through the vertex without intravenous contrast. COMPARISON:  09/11/2017 FINDINGS: Brain: Old infarct in the left posterior parietal lobe and bilateral occipital lobes. Old left basal ganglia lacunar infarcts. There is atrophy and chronic small vessel disease changes. No acute infarction or hemorrhage. Lenticular shaped mass in the right frontal region measures up to 3.3 cm and is stable since prior study and is compatible with stable meningioma. Vascular: No hyperdense vessel or unexpected calcification. Skull: No acute calvarial abnormality. Sinuses/Orbits: Visualized paranasal sinuses and mastoids clear. Orbital soft tissues unremarkable. Other: None IMPRESSION: Old posterior left parietal and bilateral occipital infarcts. Old left basal ganglia lacunar infarcts. Stable right frontal meningioma. Atrophy,  chronic microvascular disease. No acute intracranial abnormality. Electronically Signed   By: Rolm Baptise M.D.   On: 03/19/2018 23:40   Dg Chest Portable 1 View  Result Date: 03/19/2018 CLINICAL DATA:  Cough EXAM: PORTABLE CHEST 1 VIEW COMPARISON:  09/10/2017 FINDINGS: Right dialysis catheter is in place with the tip at the  cavoatrial junction. Cardiomegaly. No confluent airspace opacities, effusions or edema. No acute bony abnormality. IMPRESSION: Cardiomegaly.  No active disease. Electronically Signed   By: Rolm Baptise M.D.   On: 03/19/2018 22:54   Old chart reviewed Case discussed with Dr. Kathrynn Humble in the ED Chest x-ray reviewed no edema or infiltrate  Assessment/Plan 68 year old male with new seizure activity Principal Problem:   Seizure (HCC)-glucose normal in the setting of a seizure this time.  No fever no recent illnesses.  Patient does have a history of stroke may be post stroke seizure activity.  Patient given Keppra 2,000 g in the emergency department.  Neurology has been consulted further recommendations per neurology team.  Placed on seizure precautions.  Active Problems:   ESRD on hemodialysis (HCC)-electrolytes are normal BUN and creatinine are at his baseline.  He is dialysis patient Tuesday Thursday and Saturday.  If patient here till Saturday call nephrology to arrange routine dialysis.    Atrial fibrillation, chronic-currently rate controlled    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HCC)-stable    Essential hypertension-resume home meds    Dementia (HCC)-stable    Chronic combined systolic (congestive) and diastolic (congestive) heart failure (HCC)-currently compensated and stable    Stroke [facility]) history of-noted   Med rec pending pharmacy review   DVT prophylaxis: SCDs Code Status: Full Family Communication: None Disposition Plan: 1 to 3 days Consults called: Neurology Admission status: Admission   Kiriana Worthington A MD [facility]  If 7PM-7AM,  please contact night-coverage [email or web address] Password [email or web address]  03/20/2018, 12:34 AM

## 2018-03-20 NOTE — Progress Notes (Addendum)
NEURO HOSPITALIST PROGRESS NOTE   Subjective: Patient awake, alert, in NAD.  Exam: Vitals:   03/20/18 0205 03/20/18 0900  BP: 124/72 128/88  Pulse: (!) 58 (!) 51  Resp: 18 18  Temp: 98.3 F (36.8 C) 97.7 F (36.5 C)  SpO2: 100% 99%    Physical Exam   HEENT-  Normocephalic, no lesions, without obvious abnormality.  Normal external eye and conjunctiva.   Cardiovascular- S1-S2 audible, pulses palpable throughout   Lungs-no rhonchi or wheezing noted, no excessive working breathing.  Saturations within normal limits on RA Abdomen- All 4 quadrants palpated and nontender Extremities- Warm, left AKA. Musculoskeletal-no joint tenderness, deformity or swelling Skin-warm and dry, excessively dru   Neuro:  Mental Status: Alert, oriented to name and birthdate.  Some perseveration. Mild dysarthria noted.   Able to follow  Some simple commands. Inattentive. Some naming intact.  Cranial Nerves: II:  Visual fields grossly normal,  III,IV, VI: ptosis not present, extra-ocular motions intact bilaterally pupils equal, round, reactive to light and accommodation V,VII: smile symmetric, facial light touch sensation normal bilaterally VIII: hearing normal bilaterally IX,X: uvula rises symmetrically XI: bilateral shoulder shrug XII: midline tongue extension Motor: Right : Upper extremity   5/5   Left:     Upper extremity 5/5  Lower extremity : 5/5     Lower extremity AKA Tone and bulk:normal tone throughout; no atrophy noted Sensory:  light touch intact BUE Cerebellar: normal finger-to-nose Gait: deferred    Medications:  Scheduled: . Chlorhexidine Gluconate Cloth  6 each Topical Q0600  . mupirocin ointment  1 application Nasal BID  . sodium chloride flush  3 mL Intravenous Q12H   Continuous: . sodium chloride     QIW:LNLGXQ chloride, ondansetron **OR** ondansetron (ZOFRAN) IV, sodium chloride flush  Pertinent Labs/Diagnostics:  MRI results: pending  Ct  Head Wo Contrast  Result Date: 03/19/2018 CLINICAL DATA:  Seizure EXAM: CT HEAD WITHOUT CONTRAST TECHNIQUE: Contiguous axial images were obtained from the base of the skull through the vertex without intravenous contrast. COMPARISON:  09/11/2017 FINDINGS: Brain: Old infarct in the left posterior parietal lobe and bilateral occipital lobes. Old left basal ganglia lacunar infarcts. There is atrophy and chronic small vessel disease changes. No acute infarction or hemorrhage. Lenticular shaped mass in the right frontal region measures up to 3.3 cm and is stable since prior study and is compatible with stable meningioma. Vascular: No hyperdense vessel or unexpected calcification. Skull: No acute calvarial abnormality. Sinuses/Orbits: Visualized paranasal sinuses and mastoids clear. Orbital soft tissues unremarkable. Other: None IMPRESSION: Old posterior left parietal and bilateral occipital infarcts. Old left basal ganglia lacunar infarcts. Stable right frontal meningioma. Atrophy, chronic microvascular disease. No acute intracranial abnormality. Electronically Signed   By: Rolm Baptise M.D.   On: 03/19/2018 23:40   Dg Chest Portable 1 View  Result Date: 03/19/2018 CLINICAL DATA:  Cough EXAM: PORTABLE CHEST 1 VIEW COMPARISON:  09/10/2017 FINDINGS: Right dialysis catheter is in place with the tip at the cavoatrial junction. Cardiomegaly. No confluent airspace opacities, effusions or edema. No acute bony abnormality. IMPRESSION: Cardiomegaly.  No active disease. Electronically Signed   By: Rolm Baptise M.D.   On: 03/19/2018 22:54    ASSESSMENT AND PLAN  68 y.o. male asked medical history of atrial fibrillation on Eliquis, heart failure, stroke, dementia, end-stage renal disease, type 2 diabetes mellitus, left below the knee amputation presents  with status epilepticus.    Patient initially noted to have right upper extremity and lower extremity jerking, likely from old left parietal infarct which then  progressed to generalized tonic-clonic seizures.  Resolved after receiving Versed.  But continued to have right-sided Todd's paralysis which is now improved.  He is now awake and following commands.   Impression: Status Epilepticus- resolved Todd's paralysis from the seizures. Likely focus of the seizure from the prior strokes.  Recommendations -Continue Keppra 500 twice daily -MRI-pending -Maintain seizure precautions -Correct toxic metabolic derangements per primary team as you are.  Laurey Morale, MSN, NP-C [facility] [phone]  Attending Neurohospitalist Addendum Patient seen and examined with APP/Resident. Agree with the history and physical as documented above. Agree with the plan as documented, which I helped formulate. I have independently reviewed the chart, obtained history, review of systems and examined the patient.I have personally reviewed pertinent head/neck/spine imaging (CT/MRI). Please feel free to call with any questions. --- Amie Portland, MD [facility] Pager: [phone]  If 7pm to 7am, please call on call as listed on AMION.     03/20/2018, 10:46 AM

## 2018-03-20 NOTE — Consult Note (Signed)
[facility] Nurse wound consult note Reason for Consult:foot wound Wound type: unclear etiology, has been present for some time. Patient not able to give history. Patient was seen by on [facility] in July of 2019. Pressure Injury POA: NA Measurement: 3cm x 3cm x 0.1cm  Wound bed:100% pink, non healing, partial thickness ulceration Drainage (amount, consistency, odor) minimal, no odor Periwound: intact, weak distal pulses, history of amputation on the LLE Dressing procedure/placement/frequency:  Single layer of xeroform to the wound, cover with dry dressing. Secure with conform or kerlix gauze.   Discussed POC with patient and bedside nurse.  Re consult if needed, will not follow at this time. Thanks  Leelah Hanna [employer], RN,CWOCN, CNS, [place] [phone])

## 2018-03-20 NOTE — Progress Notes (Addendum)
[facility] TEAM 1 - Stepdown/ICU TEAM  George Burgess  CLE:[id number] DOB: 09-09-1950 DOA: 03/19/2018 PCP: Caprice Renshaw, MD    Brief Narrative:  68 y.o. male w/ a hx of A. fib, chronic systolic heart failure, dementia, hypertension, diabetes, and prior seizure in the setting of hypoglycemia who was brought in after having a prolonged tonic-clonic seizure at his nursing home.    Significant Events: 1/31 admit   Subjective: Pt is seen for a f/u visit.    Assessment & Plan:  Seizure Care as per Neurology   ESRD on hemodialysis - HD Tuesday Thursday and Saturday I contacted Nephrology to arrange for HD on 03/21/18  Atrial fibrillation, chronic  Anemia due to end stage renal disease  Essential hypertension  Dementia   Chronic combined systolic and diastolic heart failure  DM  Stroke   MRSA screen +   DVT prophylaxis: SCDs Code Status: FULL CODE Family Communication:  Disposition Plan: monitor in hospital - assure HD arranged for 2/1 - possible return to SNF in 24-48hrs if no further seizure activity   Consultants:  Neurology   Antimicrobials:  none   Objective: Blood pressure (!) 155/89, pulse 67, temperature 98.1 F (36.7 C), temperature source Oral, resp. rate 20, weight 80 kg, SpO2 100 %. No intake or output data in the 24 hours ending 03/20/18 1345 Filed Weights   03/19/18 2139  Weight: 80 kg    Examination: Pt was seen for a f/u visit.    CBC: Recent Labs  Lab 03/19/18 2153 03/19/18 2213  WBC  --  8.8  HGB 13.6 12.6*  HCT 40.0 41.4  MCV  --  90.4  PLT  --  PLATELET CLUMPS NOTED ON SMEAR, UNABLE TO ESTIMATE   Basic Metabolic Panel: Recent Labs  Lab 03/19/18 2153 03/19/18 2213  NA 135 135  K 3.9 3.9  CL  --  98  CO2  --  23  GLUCOSE  --  203*  BUN  --  17  CREATININE  --  3.95*  CALCIUM  --  8.7*  MG  --  1.7  PHOS  --  2.2*   GFR: CrCl cannot be calculated (Unknown ideal weight.).  Liver Function Tests: Recent Labs  Lab  03/19/18 2213  AST 35  ALT 32  ALKPHOS 135*  BILITOT 1.8*  PROT 7.0  ALBUMIN 3.7    HbA1C: Hgb A1c MFr Bld  Date/Time Value Ref Range Status  09/11/2017 01:30 AM 7.2 (H) 4.8 - 5.6 % Final    Comment:    (NOTE) Pre diabetes:          5.7%-6.4% Diabetes:              >6.4% Glycemic control for   <7.0% adults with diabetes   09/10/2016 07:19 PM 5.1 4.8 - 5.6 % Final    Comment:    (NOTE)         Pre-diabetes: 5.7 - 6.4         Diabetes: >6.4         Glycemic control for adults with diabetes: <7.0     CBG: Recent Labs  Lab 03/19/18 2131 03/20/18 0628  GLUCAP 203* 103*    Recent Results (from the past 240 hour(s))  MRSA PCR Screening     Status: Abnormal   Collection Time: 03/20/18  2:49 AM  Result Value Ref Range Status   MRSA by PCR POSITIVE (A) NEGATIVE Final    Comment:  The GeneXpert MRSA Assay (FDA approved for NASAL specimens only), is one component of a comprehensive MRSA colonization surveillance program. It is not intended to diagnose MRSA infection nor to guide or monitor treatment for MRSA infections. RESULT CALLED TO, READ BACK BY AND VERIFIED WITH: Clemmie Krill RN [id number] 03/20/18 A BROWNING Performed at [facility], [place] [address] [place], [place] [address]      Scheduled Meds: . Chlorhexidine Gluconate Cloth  6 each Topical Q0600  . mupirocin ointment  1 application Nasal BID  . sodium chloride flush  3 mL Intravenous Q12H   Continuous Infusions: . sodium chloride       LOS: 0 days   Time spent: No Charge  Cherene Altes, MD [facility] Office  [phone] Pager - Text Page per Amion as per below:  On-Call/Text Page:      [email or web address]  If 7PM-7AM, please contact night-coverage [email or web address] 03/20/2018, 1:45 PM

## 2018-03-20 NOTE — ED Provider Notes (Signed)
[facility] EMERGENCY DEPARTMENT Provider Note   CSN: [id number] Arrival date & time: 03/19/18  2120     History   Chief Complaint Chief Complaint  Patient presents with  . Seizures    HPI George Burgess is a 68 y.o. male.  HPI  Level 5 caveat for altered mental status  68 year old male with history of A. Fib, CHF, encephalopathy, ESRD (TTS), stroke, PVD comes in with chief complaint of seizures.  Patient resides at [facility].  According to the EMS team, they received a call because patient was having strokelike symptoms.  When they arrived to the nursing home patient was having focal right-sided generalized shaking, and patient was conscious.  The symptoms have been going on for about 15 minutes when they had arrived.  Once the patient was placed in the rig, patient seizure turned into a generalized tonic-clonic shaking and he lost consciousness.  He was given 5 mg of IM Versed in route and patient arrives to the ER unresponsive.  I called the nursing home.  They reported that patient is normally alert and oriented x3.  There was no recent illness or trauma to report.  Past Medical History:  Diagnosis Date  . Atrial fibrillation ([place])    a. Chronic Eliquis (CHA2DS2VASc = 6).  . Chronic combined systolic (congestive) and diastolic (congestive) heart failure ([place])    a. Previously reported EF of 30%;  b. 08/2016 Echo: EF 40-45%, antsept, inf, infsept HK, Gr3 DD, mod AI/MR, sev dil LA, mild TR, PASP 9mmHg.  Marland Kitchen Dementia ([place])   . Encephalopathy   . End stage renal disease ([place])    a. On HD.  Marland Kitchen Essential hypertension   . GIB (gastrointestinal bleeding)    a. 08/2016 in setting of Eliquis Rx.  . Pleural effusion   . PVD (peripheral vascular disease) ([place])    a. s/p L BKA;  b. Chronic LE wounds.  . Stroke ([place])   . Type II diabetes mellitus [facility])     Patient Active Problem List   Diagnosis Date Noted  . Dementia ([place])   . Chronic combined  systolic (congestive) and diastolic (congestive) heart failure ([place])   . PVD (peripheral vascular disease) ([place])   . Stroke ([place])   . Seizure ([place]) 09/11/2017  . ESRD on hemodialysis ([place]) 09/11/2017  . Elevated troponin 09/11/2017  . Atrial fibrillation, chronic 09/11/2017  . 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[place]) 09/11/2017  . Essential hypertension 09/11/2017  . Hypoglycemia 09/11/2017  . Syncope 09/10/2017  . Blindness   . Bloody stools   . Encounter for nasogastric (NG) tube placement   . Lower GI bleed   . Malnutrition of moderate degree 09/05/2016  . Acute GI bleeding 09/04/2016    Past Surgical History:  Procedure Laterality Date  . BELOW KNEE LEG AMPUTATION Left   . FLEXIBLE SIGMOIDOSCOPY N/A 09/07/2016   Procedure: FLEXIBLE SIGMOIDOSCOPY;  Surgeon: Manus Gunning, MD;  Location: [facility];  Service: Gastroenterology;  Laterality: N/A;        Home Medications    Prior to Admission medications   Medication Sig Start Date End Date Taking? Authorizing Provider  acetaminophen (TYLENOL) 325 MG tablet Take 650 mg by mouth every 4 (four) hours as needed (for pain).    Yes [provider]  aspirin 81 MG tablet Chew 81 mg by mouth daily.   Yes [provider]  atorvastatin (LIPITOR) 80 MG tablet Take 1 tablet (80 mg total) by  mouth daily at 6 PM. 09/13/16  Yes Mikhail, Velta Addison, DO  B Complex-C-Folic Acid (NEPHRO VITAMINS) 0.8 MG TABS Take 1 tablet by mouth daily.   Yes [provider]  calcium carbonate (TUMS EX) 750 MG chewable tablet Chew 1 tablet by mouth See admin instructions. Chew 1 tablet by mouth three times a day with meals and 1 tablet three times a day with snacks   Yes [provider]  ethyl chloride spray Apply 1 application topically Every Tuesday,Thursday,and Saturday with dialysis.   Yes [provider]  famotidine (PEPCID) 20 MG tablet Take 20 mg by mouth daily.   Yes [provider]    isosorbide mononitrate (IMDUR) 30 MG 24 hr tablet Take 30 mg by mouth every evening.    Yes [provider]  lamoTRIgine (LAMICTAL) 25 MG tablet Take 25 mg by mouth at bedtime.    Yes [provider]  metoprolol tartrate (LOPRESSOR) 25 MG tablet Take 25 mg by mouth 2 (two) times daily.   Yes [provider]  nitroGLYCERIN (NITROSTAT) 0.4 MG SL tablet Place 0.4 mg under the tongue every 5 (five) minutes x 3 doses as needed for chest pain.    Yes [provider]  Nutritional Supplements (FEEDING SUPPLEMENT, NEPRO CARB STEADY,) LIQD Take 237 mLs by mouth 2 (two) times daily between meals.    Yes [provider]  ascorbic acid (VITAMIN C) 500 MG tablet Take 500 mg by mouth daily.    [provider]  collagenase (SANTYL) ointment Apply 1 application topically daily. To wound on right lateral food    [provider]  ertapenem [facility]) IVPB 500mg  IM daily. 2 doses remaining (on 7/26 and 7/27 following hemodialysis). Patient not taking: Reported on 03/19/2018 09/12/17   Patrecia Pour, MD  lidocaine-prilocaine (EMLA) cream Apply 1 application topically Every Tuesday,Thursday,and Saturday with dialysis. Thick layer to fistula site left arm then wrap with saran wrap prior to dialysis    [provider]    Family History Family History  Problem Relation Age of Onset  . Other Mother        Pt unsure of PMH of family members.    Social History Social History   Tobacco Use  . Smoking status: Former Research scientist (life sciences)  . Smokeless tobacco: Never Used  . Tobacco comment: Pt thinks he used to smoke cigarettes.  Thinks he quit many years ago.  Substance Use Topics  . Alcohol use: No  . Drug use: No     Allergies   Patient has no known allergies.   Review of Systems Review of Systems  Unable to perform ROS: Mental status change     Physical Exam Updated Vital Signs BP (!) 142/77   Pulse 71   Temp 99.2 F (37.3 C) (Oral)   Resp  (!) 29   Wt 80 kg   SpO2 100%   BMI 23.92 kg/m   Physical Exam Vitals signs and nursing note reviewed.  Constitutional:      Appearance: He is well-developed.  HENT:     Head: Atraumatic.  Eyes:     Comments: 2 mm and equal pupils  Neck:     Musculoskeletal: Neck supple.  Cardiovascular:     Rate and Rhythm: Normal rate.  Pulmonary:     Effort: Pulmonary effort is normal.  Skin:    General: Skin is warm.  Neurological:     Mental Status: He is disoriented.      ED Treatments /  Results  Labs (all labs ordered are listed, but only abnormal results are displayed) Labs Reviewed  COMPREHENSIVE METABOLIC PANEL - Abnormal; Notable for the following components:      Result Value   Glucose, Bld 203 (*)    Creatinine, Ser 3.95 (*)    Calcium 8.7 (*)    Alkaline Phosphatase 135 (*)    Total Bilirubin 1.8 (*)    GFR calc non Af Amer 15 (*)    GFR calc Af Amer 17 (*)    All other components within normal limits  CBC - Abnormal; Notable for the following components:   Hemoglobin 12.6 (*)    RDW 19.7 (*)    All other components within normal limits  PHOSPHORUS - Abnormal; Notable for the following components:   Phosphorus 2.2 (*)    All other components within normal limits  CBG MONITORING, ED - Abnormal; Notable for the following components:   Glucose-Capillary 203 (*)    All other components within normal limits  POCT I-STAT 7, (LYTES, BLD GAS, ICA,H+H) - Abnormal; Notable for the following components:   pO2, Arterial 76.0 (*)    Acid-Base Excess 3.0 (*)    Calcium, Ion 1.13 (*)    All other components within normal limits  MAGNESIUM  ETHANOL  I-STAT ARTERIAL BLOOD GAS, ED    EKG EKG Interpretation  Date/Time:  Thursday March 19 2018 21:26:47 EST Ventricular Rate:  73 PR Interval:    QRS Duration: 121 QT Interval:  430 QTC Calculation: 474 R Axis:   -168 Text Interpretation:  Sinus or ectopic atrial rhythm RBBB and LPFB Nonspecific T abnormalities, lateral  leads No acute changes TWI in the infeiror and lateral leads are new Confirmed by Varney Biles [phone]) on 03/19/2018 11:07:24 PM   Radiology Ct Head Wo Contrast  Result Date: 03/19/2018 CLINICAL DATA:  Seizure EXAM: CT HEAD WITHOUT CONTRAST TECHNIQUE: Contiguous axial images were obtained from the base of the skull through the vertex without intravenous contrast. COMPARISON:  09/11/2017 FINDINGS: Brain: Old infarct in the left posterior parietal lobe and bilateral occipital lobes. Old left basal ganglia lacunar infarcts. There is atrophy and chronic small vessel disease changes. No acute infarction or hemorrhage. Lenticular shaped mass in the right frontal region measures up to 3.3 cm and is stable since prior study and is compatible with stable meningioma. Vascular: No hyperdense vessel or unexpected calcification. Skull: No acute calvarial abnormality. Sinuses/Orbits: Visualized paranasal sinuses and mastoids clear. Orbital soft tissues unremarkable. Other: None IMPRESSION: Old posterior left parietal and bilateral occipital infarcts. Old left basal ganglia lacunar infarcts. Stable right frontal meningioma. Atrophy, chronic microvascular disease. No acute intracranial abnormality. Electronically Signed   By: Rolm Baptise M.D.   On: 03/19/2018 23:40   Dg Chest Portable 1 View  Result Date: 03/19/2018 CLINICAL DATA:  Cough EXAM: PORTABLE CHEST 1 VIEW COMPARISON:  09/10/2017 FINDINGS: Right dialysis catheter is in place with the tip at the cavoatrial junction. Cardiomegaly. No confluent airspace opacities, effusions or edema. No acute bony abnormality. IMPRESSION: Cardiomegaly.  No active disease. Electronically Signed   By: Rolm Baptise M.D.   On: 03/19/2018 22:54    Procedures .Critical Care Performed by: Varney Biles, MD Authorized by: Varney Biles, MD   Critical care provider statement:    Critical care time (minutes):  45   Critical care was necessary to treat or prevent imminent or  life-threatening deterioration of the following conditions:  CNS failure or compromise   Critical care was time spent  personally by me on the following activities:  Discussions with consultants, evaluation of patient's response to treatment, examination of patient, ordering and performing treatments and interventions, ordering and review of laboratory studies, ordering and review of radiographic studies, pulse oximetry, re-evaluation of patient's condition, obtaining history from patient or surrogate and review of old charts   (including critical care time)  Medications Ordered in ED Medications  sodium chloride flush (NS) 0.9 % injection 3 mL (3 mLs Intravenous Given 03/20/18 0049)  sodium chloride flush (NS) 0.9 % injection 3 mL (has no administration in time range)  0.9 %  sodium chloride infusion (has no administration in time range)  ondansetron (ZOFRAN) tablet 4 mg (has no administration in time range)    Or  ondansetron (ZOFRAN) injection 4 mg (has no administration in time range)  levETIRAcetam (KEPPRA) 2,000 mg in sodium chloride 0.9 % 100 mL IVPB (0 mg Intravenous Stopped 03/19/18 2244)     Initial Impression / Assessment and Plan / ED Course  I have reviewed the triage vital signs and the nursing notes.  Pertinent labs & imaging results that were available during my care of the patient were reviewed by me and considered in my medical decision making (see chart for details).  Clinical Course as of Mar 20 114  Fri Mar 20, 2018  0115 Patient is arousable. He tells me his name and is aware that he is in the hospital.  He is not providing any further meaningful history at this time.  Vitals are within normal limit.    [AN]    Clinical Course User Index [AN] Varney Biles, MD    68 year old male comes in with chief complaint of seizure like activity.  He arrives to the ER postictal and unresponsive.  Patient has a very weak gag reflex.  It appears that he had a focal seizure for  about 20 minutes followed by generalized tonic-clonic shaking for 5 minutes.  His seizures ended 40 minutes ago and he was still unresponsive when he arrived to the ER.  With clinical concerns for possible subclinical seizures, we were thinking about intubating the patient when he slowly started getting better.  Patient is still not providing meaningful history.  We discussed the case with [facility], unfortunately the staff that called 911 was not available, however the incoming staff reported that patient was not sick recently and normally he is alert and oriented.  CT head is negative for any acute findings.  He has history of ESRD and has chronic ulcers in his right lower extremity.  No fevers here.  Further chart review reveals that patient had a seizure-like activity during his last admission.  At that time decision was made to not start him on antiepileptics because it seemed that the seizure was due to hypoglycemia.  I discussed the case with neurology team.  They recommend that we give the patient 2 g of Keppra right now and they will see the patient.   Final Clinical Impressions(s) / ED Diagnoses   Final diagnoses:  Status epilepticus [facility])    ED Discharge Orders    None       Varney Biles, MD 03/20/18 [id number]

## 2018-03-20 NOTE — Clinical Social Work Note (Signed)
Clinical Social Work Assessment  Patient Details  Name: George Burgess MRN: [id number] Date of Birth: 1950-05-01  Date of referral:  03/20/18               Reason for consult:  Facility Placement                Permission sought to share information with:  Chartered certified accountant granted to share information::  Yes, Verbal Permission Granted  Name::        Agency::  [facility]  Relationship::     Contact Information:     Housing/Transportation Living arrangements for the past 2 months:  [place] of Information:  Medical Team, Facility Patient Interpreter Needed:  None Criminal Activity/Legal Involvement Pertinent to Current Situation/Hospitalization:  No - Comment as needed Significant Relationships:  None Lives with:  Self, Facility Resident Do you feel safe going back to the place where you live?  Yes Need for family participation in patient care:  Yes (Comment)(but no family available)  Care giving concerns:  Patient from [facility] long term care, and has no family available for decision making at this time. Per facility, they are pursuing a guardian being placed for him through the county. Plan is to return to [facility] for long term care when stable.   Social Worker assessment / plan:  CSW reviewed chart and noted that family phone numbers are for the patient's SNF. CSW reached out to SNF, and discussed patient information. Per SNF, he has no family involved and they have been pursuing having a guardian placed for him through the county. No guardian has been placed for him at this time so SNF has been making plan of care decisions for him in lieu of family. Plan is for patient to return to SNF.   Employment status:  Retired Forensic scientist:  Medicare PT Recommendations:  Not assessed at this time Information / Referral to community resources:     Patient/Family's Response to care:  Patient's facility agreeable to take patient  back when stable.  Patient/Family's Understanding of and Emotional Response to Diagnosis, Current Treatment, and Prognosis:  Patient's SNF indicated that there has not been any family involved with the patient and he has been a resident of theirs for quite some time. Patient's SNF will take him back when he's stable, he is a long term care resident.  Emotional Assessment Appearance:  Appears stated age Attitude/Demeanor/Rapport:  Unable to Assess Affect (typically observed):  Unable to Assess Orientation:  Oriented to Self, Oriented to Place Alcohol / Substance use:  Not Applicable Psych involvement (Current and /or in the community):  No (Comment)  Discharge Needs  Concerns to be addressed:  Care Coordination Readmission within the last 30 days:  No Current discharge risk:  Physical Impairment, Cognitively Impaired Barriers to Discharge:  Continued Medical Work up   [employer], [place] 03/20/2018, 4:55 PM

## 2018-03-21 DIAGNOSIS — F0390 Unspecified dementia without behavioral disturbance: Secondary | ICD-10-CM

## 2018-03-21 DIAGNOSIS — I10 Essential (primary) hypertension: Secondary | ICD-10-CM

## 2018-03-21 DIAGNOSIS — I482 Chronic atrial fibrillation, unspecified: Secondary | ICD-10-CM

## 2018-03-21 DIAGNOSIS — D631 Anemia in chronic kidney disease: Secondary | ICD-10-CM

## 2018-03-21 DIAGNOSIS — Z992 Dependence on renal dialysis: Secondary | ICD-10-CM

## 2018-03-21 DIAGNOSIS — I5042 Chronic combined systolic (congestive) and diastolic (congestive) heart failure: Secondary | ICD-10-CM

## 2018-03-21 DIAGNOSIS — G40909 Epilepsy, unspecified, not intractable, without status epilepticus: Secondary | ICD-10-CM

## 2018-03-21 HISTORY — DX: Epilepsy, unspecified, not intractable, without status epilepticus: G40.909

## 2018-03-21 LAB — CBC
HCT: 39.1 % (ref 39.0–52.0)
Hemoglobin: 12 g/dL — ABNORMAL LOW (ref 13.0–17.0)
MCH: 27.5 pg (ref 26.0–34.0)
MCHC: 30.7 g/dL (ref 30.0–36.0)
MCV: 89.7 fL (ref 80.0–100.0)
Platelets: DECREASED 10*3/uL (ref 150–400)
RBC: 4.36 MIL/uL (ref 4.22–5.81)
RDW: 19.6 % — ABNORMAL HIGH (ref 11.5–15.5)
WBC: 6.9 10*3/uL (ref 4.0–10.5)
nRBC: 0 % (ref 0.0–0.2)

## 2018-03-21 LAB — RENAL FUNCTION PANEL
Albumin: 3.4 g/dL — ABNORMAL LOW (ref 3.5–5.0)
Anion gap: 12 (ref 5–15)
BUN: 34 mg/dL — AB (ref 8–23)
CALCIUM: 8.8 mg/dL — AB (ref 8.9–10.3)
CO2: 24 mmol/L (ref 22–32)
Chloride: 102 mmol/L (ref 98–111)
Creatinine, Ser: 6.47 mg/dL — ABNORMAL HIGH (ref 0.61–1.24)
GFR calc Af Amer: 9 mL/min — ABNORMAL LOW (ref >60)
GFR calc non Af Amer: 8 mL/min — ABNORMAL LOW (ref >60)
Glucose, Bld: 191 mg/dL — ABNORMAL HIGH (ref 70–99)
Phosphorus: 3.4 mg/dL (ref 2.5–4.6)
Potassium: 4 mmol/L (ref 3.5–5.1)
Sodium: 138 mmol/L (ref 135–145)

## 2018-03-21 MED ORDER — LEVETIRACETAM 500 MG PO TABS: 500.0000 mg | ORAL_TABLET | Freq: Two times a day (BID) | ORAL | 3 refills | Status: AC

## 2018-03-21 MED ORDER — HEPARIN SODIUM (PORCINE) 1000 UNIT/ML IJ SOLN
INTRAMUSCULAR | Status: AC
  Filled 2018-03-21: qty 5

## 2018-03-21 NOTE — Progress Notes (Signed)
Subjective:  Alert but confused this AM- does not know his HD unit - lives in [place]- not in [facility] system- due for HD today   Objective Vital signs in last 24 hours: Vitals:   03/20/18 1216 03/20/18 1542 03/20/18 1952 03/20/18 2336  BP: (!) 155/89 (!) 166/84 123/70 124/84  Pulse: 67 68 (!) 49 72  Resp: 20 18 18 15   Temp: 98.1 F (36.7 C) 97.7 F (36.5 C) 97.9 F (36.6 C) 98.2 F (36.8 C)  TempSrc: Oral Oral Oral Oral  SpO2: 100% 100% 97% 100%  Weight:       Weight change:   Intake/Output Summary (Last 24 hours) at 03/21/2018 0815 Last data filed at 03/20/2018 1548 Gross per 24 hour  Intake -  Output 300 ml  Net -300 ml    Assessment/ Plan: Pt is a 68 y.o. yo male with ESRD and dementia who was admitted on 03/19/2018 with prolonged sz at SNF  Assessment/Plan: 1. SZ- per neuro, on keppra 2. ESRD - we are pretty sure TTS but dont know unit- lives at [facility].  Planning on routine HD today via [facility]  3. Anemia- not an issue 4. Secondary hyperparathyroidism- phos here is low- no binder  5. HTN/volume- BP seems fine- on metoprolol- does not seem too volume overloaded 6. Dispo- apparently no family- looking into guardianship for pt - to go back to [facility] upon d/c 7. UTI? Makes a prettty good amount of urine- is cloudy- will check U/A an culture   Louis Meckel    Labs: Basic Metabolic Panel: Recent Labs  Lab 03/19/18 2153 03/19/18 2213  NA 135 135  K 3.9 3.9  CL  --  98  CO2  --  23  GLUCOSE  --  203*  BUN  --  17  CREATININE  --  3.95*  CALCIUM  --  8.7*  PHOS  --  2.2*   Liver Function Tests: Recent Labs  Lab 03/19/18 2213  AST 35  ALT 32  ALKPHOS 135*  BILITOT 1.8*  PROT 7.0  ALBUMIN 3.7   No results for input(s): LIPASE, AMYLASE in the last 168 hours. No results for input(s): AMMONIA in the last 168 hours. CBC: Recent Labs  Lab 03/19/18 2153 03/19/18 2213  WBC  --  8.8  HGB 13.6 12.6*  HCT 40.0 41.4  MCV  --  90.4  PLT   --  PLATELET CLUMPS NOTED ON SMEAR, UNABLE TO ESTIMATE   Cardiac Enzymes: No results for input(s): CKTOTAL, CKMB, CKMBINDEX, TROPONINI in the last 168 hours. CBG: Recent Labs  Lab 03/19/18 2131 03/20/18 0628  GLUCAP 203* 103*    Iron Studies: No results for input(s): IRON, TIBC, TRANSFERRIN, FERRITIN in the last 72 hours. Studies/Results: Ct Head Wo Contrast  Result Date: 03/19/2018 CLINICAL DATA:  Seizure EXAM: CT HEAD WITHOUT CONTRAST TECHNIQUE: Contiguous axial images were obtained from the base of the skull through the vertex without intravenous contrast. COMPARISON:  09/11/2017 FINDINGS: Brain: Old infarct in the left posterior parietal lobe and bilateral occipital lobes. Old left basal ganglia lacunar infarcts. There is atrophy and chronic small vessel disease changes. No acute infarction or hemorrhage. Lenticular shaped mass in the right frontal region measures up to 3.3 cm and is stable since prior study and is compatible with stable meningioma. Vascular: No hyperdense vessel or unexpected calcification. Skull: No acute calvarial abnormality. Sinuses/Orbits: Visualized paranasal sinuses and mastoids clear. Orbital soft tissues unremarkable. Other: None IMPRESSION: Old posterior left  parietal and bilateral occipital infarcts. Old left basal ganglia lacunar infarcts. Stable right frontal meningioma. Atrophy, chronic microvascular disease. No acute intracranial abnormality. Electronically Signed   By: Rolm Baptise M.D.   On: 03/19/2018 23:40   Mr Brain Wo Contrast  Result Date: 03/20/2018 CLINICAL DATA:  Stroke.  Seizure. EXAM: MRI HEAD WITHOUT CONTRAST TECHNIQUE: Multiplanar, multiecho pulse sequences of the brain and surrounding structures were obtained without intravenous contrast. COMPARISON:  CT head 03/19/2018, MRI 09/11/2017 FINDINGS: Brain: Negative for acute infarct Extensive atrophy. Chronic microvascular ischemic change throughout the white matter. Chronic infarct left pons.  Small chronic infarcts cerebellum bilaterally. Chronic occipital infarcts bilaterally. Chronic left parietal infarct. Right frontal meningioma measuring 3.5 cm in diameter, unchanged from the prior MRI. No significant edema in the adjacent brain. No midline shift. Vascular: Normal arterial flow voids. Skull and upper cervical spine: Negative Sinuses/Orbits: Mucous retention cyst right maxillary sinus. Normal orbit. Other: None IMPRESSION: Negative for acute infarct Extensive atrophy and extensive chronic ischemic change 3.5 cm right frontal meningioma stable. Electronically Signed   By: Franchot Gallo M.D.   On: 03/20/2018 13:07   Dg Chest Portable 1 View  Result Date: 03/19/2018 CLINICAL DATA:  Cough EXAM: PORTABLE CHEST 1 VIEW COMPARISON:  09/10/2017 FINDINGS: Right dialysis catheter is in place with the tip at the cavoatrial junction. Cardiomegaly. No confluent airspace opacities, effusions or edema. No acute bony abnormality. IMPRESSION: Cardiomegaly.  No active disease. Electronically Signed   By: Rolm Baptise M.D.   On: 03/19/2018 22:54   Medications: Infusions: . sodium chloride      Scheduled Medications: . aspirin  81 mg Oral Daily  . atorvastatin  80 mg Oral q1800  . Chlorhexidine Gluconate Cloth  6 each Topical Q0600  . Chlorhexidine Gluconate Cloth  6 each Topical Q0600  . famotidine  20 mg Oral Daily  . isosorbide mononitrate  30 mg Oral QPM  . levETIRAcetam  500 mg Oral BID  . metoprolol tartrate  25 mg Oral BID  . mupirocin ointment  1 application Nasal BID  . sodium chloride flush  3 mL Intravenous Q12H    have reviewed scheduled and prn medications.  Physical Exam: General: alert but confused Heart: RRR Lungs: moslty clear Abdomen: soft, non tender Extremities: min edema- bandage on foot- also s/p BKA on other  -  Foley bag with condom cath- urine is cloudy  Dialysis Access: right sided TDC- some blood at exit site     03/21/2018,8:15 AM  LOS: 1 day

## 2018-03-21 NOTE — Progress Notes (Signed)
Patient alert oriented to person, IV & monitor dc'd. Report given to transport. Left via ptar without complaints.

## 2018-03-21 NOTE — Progress Notes (Addendum)
NEURO HOSPITALIST PROGRESS NOTE   Subjective: Patient awake, alert, NAD, sitting up in bed eating breakfast.  Exam: Vitals:   03/20/18 1952 03/20/18 2336  BP: 123/70 124/84  Pulse: (!) 49 72  Resp: 18 15  Temp: 97.9 F (36.6 C) 98.2 F (36.8 C)  SpO2: 97% 100%    Physical Exam   HEENT-  Normocephalic, no lesions, without obvious abnormality.  Normal external eye and conjunctiva.   Cardiovascular- S1-S2 audible, pulses palpable throughout   Lungs-no rhonchi or wheezing noted, no excessive working breathing.  Saturations within normal limits on RA Abdomen- All 4 quadrants palpated and nontender Extremities- Warm, left AKA. Musculoskeletal-no joint tenderness, deformity or swelling Skin-warm and dry, excessively dru   Neuro:  Mental Status: Alert, oriented to name, month, place.Mild dysarthria noted.   Able to follow  Some simple commands.  Cranial Nerves: II:  Visual fields grossly normal,  III,IV, VI: ptosis not present, extra-ocular motions intact bilaterally pupils equal, round, reactive to light and accommodation V,VII: smile symmetric, facial light touch sensation normal bilaterally VIII: hearing normal bilaterally IX,X: uvula rises symmetrically XI: bilateral shoulder shrug XII: midline tongue extension Motor: Right : Upper extremity   5/5   Left:     Upper extremity 5/5  Lower extremity : 5/5     Lower extremity AKA Tone and bulk:normal tone throughout; no atrophy noted Sensory:  light touch intact BUE Cerebellar: normal finger-to-nose Gait: deferred    Medications:  Scheduled: . aspirin  81 mg Oral Daily  . atorvastatin  80 mg Oral q1800  . Chlorhexidine Gluconate Cloth  6 each Topical Q0600  . Chlorhexidine Gluconate Cloth  6 each Topical Q0600  . famotidine  20 mg Oral Daily  . isosorbide mononitrate  30 mg Oral QPM  . levETIRAcetam  500 mg Oral BID  . metoprolol tartrate  25 mg Oral BID  . mupirocin ointment  1 application  Nasal BID  . sodium chloride flush  3 mL Intravenous Q12H   Continuous: . sodium chloride     MEQ:ASTMHD chloride, ondansetron **OR** ondansetron (ZOFRAN) IV, sodium chloride flush  Pertinent Labs/Diagnostics:  MRI results: pending  Ct Head Wo Contrast  Result Date: 03/19/2018 CLINICAL DATA:  Seizure EXAM: CT HEAD WITHOUT CONTRAST TECHNIQUE: Contiguous axial images were obtained from the base of the skull through the vertex without intravenous contrast. COMPARISON:  09/11/2017 FINDINGS: Brain: Old infarct in the left posterior parietal lobe and bilateral occipital lobes. Old left basal ganglia lacunar infarcts. There is atrophy and chronic small vessel disease changes. No acute infarction or hemorrhage. Lenticular shaped mass in the right frontal region measures up to 3.3 cm and is stable since prior study and is compatible with stable meningioma. Vascular: No hyperdense vessel or unexpected calcification. Skull: No acute calvarial abnormality. Sinuses/Orbits: Visualized paranasal sinuses and mastoids clear. Orbital soft tissues unremarkable. Other: None IMPRESSION: Old posterior left parietal and bilateral occipital infarcts. Old left basal ganglia lacunar infarcts. Stable right frontal meningioma. Atrophy, chronic microvascular disease. No acute intracranial abnormality. Electronically Signed   By: Rolm Baptise M.D.   On: 03/19/2018 23:40   Mr Brain Wo Contrast  Result Date: 03/20/2018 CLINICAL DATA:  Stroke.  Seizure. EXAM: MRI HEAD WITHOUT CONTRAST TECHNIQUE: Multiplanar, multiecho pulse sequences of the brain and surrounding structures were obtained without intravenous contrast. COMPARISON:  CT head 03/19/2018, MRI 09/11/2017 FINDINGS: Brain: Negative for  acute infarct Extensive atrophy. Chronic microvascular ischemic change throughout the white matter. Chronic infarct left pons. Small chronic infarcts cerebellum bilaterally. Chronic occipital infarcts bilaterally. Chronic left parietal  infarct. Right frontal meningioma measuring 3.5 cm in diameter, unchanged from the prior MRI. No significant edema in the adjacent brain. No midline shift. Vascular: Normal arterial flow voids. Skull and upper cervical spine: Negative Sinuses/Orbits: Mucous retention cyst right maxillary sinus. Normal orbit. Other: None IMPRESSION: Negative for acute infarct Extensive atrophy and extensive chronic ischemic change 3.5 cm right frontal meningioma stable. Electronically Signed   By: Franchot Gallo M.D.   On: 03/20/2018 13:07   Dg Chest Portable 1 View  Result Date: 03/19/2018 CLINICAL DATA:  Cough EXAM: PORTABLE CHEST 1 VIEW COMPARISON:  09/10/2017 FINDINGS: Right dialysis catheter is in place with the tip at the cavoatrial junction. Cardiomegaly. No confluent airspace opacities, effusions or edema. No acute bony abnormality. IMPRESSION: Cardiomegaly.  No active disease. Electronically Signed   By: Rolm Baptise M.D.   On: 03/19/2018 22:54    ASSESSMENT AND PLAN  68 y.o. 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    Patient initially noted to have right upper extremity and lower extremity jerking, likely from old left parietal infarct which then progressed to generalized tonic-clonic seizures.  Resolved after receiving Versed.  But continued to have right-sided Todd's paralysis which is now improved.  He is now awake and following commands. MRI with stable right frontal meningioma and prior old strokes. No acute stroke.  Impression: Status Epilepticus- resolved Todd's paralysis from the seizures. Likely focus of the seizure from the prior strokes.  Recommendations -Continue Keppra 500 twice daily -Maintain seizure precautions -Correct toxic metabolic derangements per primary team as you are.  Laurey Morale, MSN, NP-C [facility] [phone]   ATTENDING ADDENDUM D/w J. Jimmye Norman, NP Agree with plan. Please call with questions.  -- Amie Portland, MD [facility] Pager: [phone] If 7pm to 7am, please call on call as listed on AMION.     03/21/2018, 8:35 AM

## 2018-03-21 NOTE — Progress Notes (Signed)
Discharge to: [place] Anticipated discharge date: 03/21/18 Transportation by: [facility], once dialysis complete  Report #: [id number], Room 206B  CSW signing off.  Laveda Abbe LCSW [phone]

## 2018-03-21 NOTE — NC FL2 (Signed)
[place] MEDICAID FL2 LEVEL OF CARE SCREENING TOOL     IDENTIFICATION  Patient Name: George Burgess Birthdate: [birth year] Sex: male Admission Date (Current Location): 03/19/2018  [facility] and [place] Number:  Herbalist and Address:  [facility], [place] [address] [place], [place] [address]      Provider Number: [id number]  Attending Physician Name and Address:  Edwin Dada, *  Relative Name and Phone Number:       Current Level of Care: Hospital Recommended Level of Care: [place] Prior Approval Number:    Date Approved/Denied:   PASRR Number:    Discharge Plan: SNF    Current Diagnoses: Patient Active Problem List   Diagnosis Date Noted  . Dementia ([place])   . Chronic combined systolic (congestive) and diastolic (congestive) heart failure ([place])   . PVD (peripheral vascular disease) ([place])   . Stroke ([place])   . Seizure ([place]) 09/11/2017  . ESRD on hemodialysis ([place]) 09/11/2017  . Elevated troponin 09/11/2017  . Atrial fibrillation, chronic 09/11/2017  . 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[place]) 09/11/2017  . Essential hypertension 09/11/2017  . Hypoglycemia 09/11/2017  . Syncope 09/10/2017  . Blindness   . Bloody stools   . Encounter for nasogastric (NG) tube placement   . Lower GI bleed   . Malnutrition of moderate degree 09/05/2016  . Acute GI bleeding 09/04/2016    Orientation RESPIRATION BLADDER Height & Weight     Self, Time, Situation, Place  Normal External catheter Weight: 176 lb 5.9 oz (80 kg) Height:     BEHAVIORAL SYMPTOMS/MOOD NEUROLOGICAL BOWEL NUTRITION STATUS      Continent Diet(see DC summary)  AMBULATORY STATUS COMMUNICATION OF NEEDS Skin   Limited Assist Verbally Other (Comment)(open right foot wound, gauze dressing)                       Personal Care Assistance Level of Assistance  Bathing, Feeding, Dressing Bathing Assistance: Limited assistance Feeding assistance:  Independent Dressing Assistance: Limited assistance     Functional Limitations Info  Sight, Hearing, Speech Sight Info: Adequate Hearing Info: Adequate Speech Info: Adequate    SPECIAL CARE FACTORS FREQUENCY                       Contractures Contractures Info: Not present    Additional Factors Info  Code Status, Allergies, Isolation Precautions Code Status Info: Full Allergies Info: NKA     Isolation Precautions Info: MRSA     Current Medications (03/21/2018):  This is the current hospital active medication list Current Facility-Administered Medications  Medication Dose Route Frequency Provider Last Rate Last Dose  . 0.9 %  sodium chloride infusion  250 mL Intravenous PRN Derrill Kay A, MD      . aspirin chewable tablet 81 mg  81 mg Oral Daily Joette Catching T, MD   81 mg at 03/21/18 0830  . atorvastatin (LIPITOR) tablet 80 mg  80 mg Oral q1800 Cherene Altes, MD   80 mg at 03/20/18 1517  . Chlorhexidine Gluconate Cloth 2 % PADS 6 each  6 each Topical Q0600 Cherene Altes, MD   6 each at 03/20/18 1000  . Chlorhexidine Gluconate Cloth 2 % PADS 6 each  6 each Topical Q0600 Roney Jaffe, MD   6 each at 03/21/18 [phone]  . famotidine (PEPCID) tablet 20 mg  20 mg Oral Daily Cherene Altes, MD  20 mg at 03/21/18 0830  . isosorbide mononitrate (IMDUR) 24 hr tablet 30 mg  30 mg Oral QPM Cherene Altes, MD   30 mg at 03/20/18 1517  . levETIRAcetam (KEPPRA) tablet 500 mg  500 mg Oral BID Cherene Altes, MD   500 mg at 03/21/18 0830  . metoprolol tartrate (LOPRESSOR) tablet 25 mg  25 mg Oral BID Cherene Altes, MD   25 mg at 03/21/18 0830  . mupirocin ointment (BACTROBAN) 2 % 1 application  1 application Nasal BID Cherene Altes, MD   1 application at 32/54/98 0830  . ondansetron (ZOFRAN) tablet 4 mg  4 mg Oral Q6H PRN Phillips Grout, MD       Or  . ondansetron [facility]) injection 4 mg  4 mg Intravenous Q6H PRN Derrill Kay A, MD      . sodium  chloride flush (NS) 0.9 % injection 3 mL  3 mL Intravenous Q12H Derrill Kay A, MD   3 mL at 03/20/18 2110  . sodium chloride flush (NS) 0.9 % injection 3 mL  3 mL Intravenous PRN Phillips Grout, MD         Discharge Medications: Please see discharge summary for a list of discharge medications.  Relevant Imaging Results:  Relevant Lab Results:   Additional Information SS#: [id number]  Geralynn Ochs, LCSW

## 2018-03-21 NOTE — Discharge Summary (Signed)
Physician Discharge Summary  George Burgess YQM:[id number] DOB: 1950-08-23 DOA: 03/19/2018  PCP: Caprice Renshaw, MD  Admit date: 03/19/2018 Discharge date: 03/21/2018  Admitted From: SNF  Disposition:  SNF   Recommendations for Outpatient Follow-up:  1. Continue Lamotrigine; start new Keppra twice daily 2. Follow up with Neurology in 4-6 weeks; if patient not already established with Neurology for seizure, please make new patient referral 3. Please follow up on the following pending results: 1. Urine culture       Home Health: N/A  Equipment/Devices: TBD at SNF  Discharge Condition: Fair  CODE STATUS: FULL Diet recommendation: Renal, cardiac  Brief/Interim Summary: George Burgess is a 68 y.o. M with ESRD on HD TThS, Afib not on AC, sCHF, dementia, SNF-dwelling, HTN, DM, and seizures with medical history significant of A. fib, chronic systolic heart failure, dementia, hypertension, diabetes, history of seizures who presented for seizure.  He was found having a focal seizure at the nursing home and had vomited, EMS was called.  EMS reports that his seizure then converted into a tonic-clonic generalized seizure all lasting about 25 minutes.  He was given Versed 5 mg with EMS and then given Keppra 2 g load in the ER.      PRINCIPAL HOSPITAL DIAGNOSIS: Seizure with resolved status epilepticus    Discharge Diagnoses:    Seizure with status epilepticus, resolved Todd's paralysis, resolved Patient loaded with Keppra.  Monitored in hospital without recurrent seizure. Discharged on home Lamictal and new Keppra. -Please arrange Neurology follow up   ESRD on HD Underwent routine scheduled HD on Saturday.  Chronic atrial fibrillation CHA2DS2-VASc 6, unclear why not anticoagulated.  Anemia due to CKD Stable relative to baseline.  HTN Well controlled.  Dementia Appeared at baseline.  Chronic combined systolic and diastolic CHF Appeared euvolemic.  PVD with left  BKA  History of stroke  Cloudy urine Urine noted to be cloudy by Nephrology, urine culture sent.  Asymptomatic.         Discharge Instructions  Discharge Instructions    Diet general   Complete by:  As directed    Discharge instructions   Complete by:  As directed    From Dr. Loleta Books: You were admitted for seizure. You were started on a new medicine called Keppra. Continue Lamotrigine/Lamictal.  Continue Keppra. Follow up with your regular Neurologist, call for an appointment in 4-6 weeks. If you do not already have regular follow up with a Neurologist, ask provider at your facility to refer you to one.   Increase activity slowly   Complete by:  As directed      Allergies as of 03/21/2018   No Known Allergies     Medication List    TAKE these medications   acetaminophen 325 MG tablet Commonly known as:  TYLENOL Take 650 mg by mouth every 4 (four) hours as needed (for pain).   aspirin 81 MG tablet Chew 81 mg by mouth daily.   atorvastatin 80 MG tablet Commonly known as:  LIPITOR Take 1 tablet (80 mg total) by mouth daily at 6 PM.   calcium carbonate 750 MG chewable tablet Commonly known as:  TUMS EX Chew 1 tablet by mouth See admin instructions. Chew 1 tablet by mouth three times a day with meals and 1 tablet three times a day with snacks   ethyl chloride spray Apply 1 application topically Every Tuesday,Thursday,and Saturday with dialysis.   famotidine 20 MG tablet Commonly known as:  PEPCID Take 20 mg by mouth daily.  feeding supplement (NEPRO CARB STEADY) Liqd Take 237 mLs by mouth 2 (two) times daily between meals.   isosorbide mononitrate 30 MG 24 hr tablet Commonly known as:  IMDUR Take 30 mg by mouth every evening.   lamoTRIgine 25 MG tablet Commonly known as:  LAMICTAL Take 25 mg by mouth at bedtime.   levETIRAcetam 500 MG tablet Commonly known as:  KEPPRA Take 1 tablet (500 mg total) by mouth 2 (two) times daily.   metoprolol tartrate  25 MG tablet Commonly known as:  LOPRESSOR Take 25 mg by mouth 2 (two) times daily.   NEPHRO VITAMINS 0.8 MG Tabs Take 1 tablet by mouth daily.   nitroGLYCERIN 0.4 MG SL tablet Commonly known as:  NITROSTAT Place 0.4 mg under the tongue every 5 (five) minutes x 3 doses as needed for chest pain.      Contact information for after-discharge care    Destination    [facility] .   Service:  Skilled Nursing Contact information: [place] [facility] [phone]             No Known Allergies  Consultations:  Neurology  Nephrology   Procedures/Studies: Ct Head Wo Contrast  Result Date: 03/19/2018 CLINICAL DATA:  Seizure EXAM: CT HEAD WITHOUT CONTRAST TECHNIQUE: Contiguous axial images were obtained from the base of the skull through the vertex without intravenous contrast. COMPARISON:  09/11/2017 FINDINGS: Brain: Old infarct in the left posterior parietal lobe and bilateral occipital lobes. Old left basal ganglia lacunar infarcts. There is atrophy and chronic small vessel disease changes. No acute infarction or hemorrhage. Lenticular shaped mass in the right frontal region measures up to 3.3 cm and is stable since prior study and is compatible with stable meningioma. Vascular: No hyperdense vessel or unexpected calcification. Skull: No acute calvarial abnormality. Sinuses/Orbits: Visualized paranasal sinuses and mastoids clear. Orbital soft tissues unremarkable. Other: None IMPRESSION: Old posterior left parietal and bilateral occipital infarcts. Old left basal ganglia lacunar infarcts. Stable right frontal meningioma. Atrophy, chronic microvascular disease. No acute intracranial abnormality. Electronically Signed   By: Rolm Baptise M.D.   On: 03/19/2018 23:40   Mr Brain Wo Contrast  Result Date: 03/20/2018 CLINICAL DATA:  Stroke.  Seizure. EXAM: MRI HEAD WITHOUT CONTRAST TECHNIQUE: Multiplanar, multiecho pulse sequences of the brain  and surrounding structures were obtained without intravenous contrast. COMPARISON:  CT head 03/19/2018, MRI 09/11/2017 FINDINGS: Brain: Negative for acute infarct Extensive atrophy. Chronic microvascular ischemic change throughout the white matter. Chronic infarct left pons. Small chronic infarcts cerebellum bilaterally. Chronic occipital infarcts bilaterally. Chronic left parietal infarct. Right frontal meningioma measuring 3.5 cm in diameter, unchanged from the prior MRI. No significant edema in the adjacent brain. No midline shift. Vascular: Normal arterial flow voids. Skull and upper cervical spine: Negative Sinuses/Orbits: Mucous retention cyst right maxillary sinus. Normal orbit. Other: None IMPRESSION: Negative for acute infarct Extensive atrophy and extensive chronic ischemic change 3.5 cm right frontal meningioma stable. Electronically Signed   By: Franchot Gallo M.D.   On: 03/20/2018 13:07   Dg Chest Portable 1 View  Result Date: 03/19/2018 CLINICAL DATA:  Cough EXAM: PORTABLE CHEST 1 VIEW COMPARISON:  09/10/2017 FINDINGS: Right dialysis catheter is in place with the tip at the cavoatrial junction. Cardiomegaly. No confluent airspace opacities, effusions or edema. No acute bony abnormality. IMPRESSION: Cardiomegaly.  No active disease. Electronically Signed   By: Rolm Baptise M.D.   On: 03/19/2018 22:54       Subjective:  Feels fine.  No fever, chest pain, dyspnea, swelling.  No more seizures.  No LOC.  No dysuria or urinary urgency.  Discharge Exam: Vitals:   03/20/18 2336 03/21/18 0834  BP: 124/84 (!) 145/74  Pulse: 72 63  Resp: 15 20  Temp: 98.2 F (36.8 C) 98.7 F (37.1 C)  SpO2: 100% 100%   Vitals:   03/20/18 1542 03/20/18 1952 03/20/18 2336 03/21/18 0834  BP: (!) 166/84 123/70 124/84 (!) 145/74  Pulse: 68 (!) 49 72 63  Resp: 18 18 15 20   Temp: 97.7 F (36.5 C) 97.9 F (36.6 C) 98.2 F (36.8 C) 98.7 F (37.1 C)  TempSrc: Oral Oral Oral Oral  SpO2: 100% 97% 100% 100%   Weight:        General: Pt is alert, awake, not in acute distress, appears comfortable, interactive, watching TV, left BKA Cardiovascular: RRR, nl S1-S2, soft ejection murmur.   No LE edema.   Respiratory: Normal respiratory rate and rhythm.  CTAB without rales or wheezes. Abdominal: Abdomen soft and non-tender.  No distension or HSM.   Neuro/Psych: Strength symmetric in upper and lower extremities.  Judgment and insight appear normal.   The results of significant diagnostics from this hospitalization (including imaging, microbiology, ancillary and laboratory) are listed below for reference.     Microbiology: Recent Results (from the past 240 hour(s))  MRSA PCR Screening     Status: Abnormal   Collection Time: 03/20/18  2:49 AM  Result Value Ref Range Status   MRSA by PCR POSITIVE (A) NEGATIVE Final    Comment:        The GeneXpert MRSA Assay (FDA approved for NASAL specimens only), is one component of a comprehensive MRSA colonization surveillance program. It is not intended to diagnose MRSA infection nor to guide or monitor treatment for MRSA infections. RESULT CALLED TO, READ BACK BY AND VERIFIED WITH: Clemmie Krill RN [id number] 03/20/18 A BROWNING Performed at [facility], [place] [address], [place], [place] [address]      Labs: BNP (last 3 results) No results for input(s): BNP in the last 8760 hours. Basic Metabolic Panel: Recent Labs  Lab 03/19/18 2153 03/19/18 2213  NA 135 135  K 3.9 3.9  CL  --  98  CO2  --  23  GLUCOSE  --  203*  BUN  --  17  CREATININE  --  3.95*  CALCIUM  --  8.7*  MG  --  1.7  PHOS  --  2.2*   Liver Function Tests: Recent Labs  Lab 03/19/18 2213  AST 35  ALT 32  ALKPHOS 135*  BILITOT 1.8*  PROT 7.0  ALBUMIN 3.7   No results for input(s): LIPASE, AMYLASE in the last 168 hours. No results for input(s): AMMONIA in the last 168 hours. CBC: Recent Labs  Lab 03/19/18 2153 03/19/18 2213  WBC  --  8.8  HGB 13.6 12.6*  HCT 40.0  41.4  MCV  --  90.4  PLT  --  PLATELET CLUMPS NOTED ON SMEAR, UNABLE TO ESTIMATE   Cardiac Enzymes: No results for input(s): CKTOTAL, CKMB, CKMBINDEX, TROPONINI in the last 168 hours. BNP: Invalid input(s): POCBNP CBG: Recent Labs  Lab 03/19/18 2131 03/20/18 0628  GLUCAP 203* 103*   D-Dimer No results for input(s): DDIMER in the last 72 hours. Hgb A1c No results for input(s): HGBA1C in the last 72 hours. Lipid Profile No results for input(s): CHOL, HDL, LDLCALC, TRIG, CHOLHDL, LDLDIRECT in the last  72 hours. Thyroid function studies No results for input(s): TSH, T4TOTAL, T3FREE, THYROIDAB in the last 72 hours.  Invalid input(s): FREET3 Anemia work up No results for input(s): VITAMINB12, FOLATE, FERRITIN, TIBC, IRON, RETICCTPCT in the last 72 hours. Urinalysis    Component Value Date/Time   COLORURINE YELLOW 11/11/2016 1530   APPEARANCEUR HAZY (A) 11/11/2016 1530   LABSPEC 1.013 11/11/2016 1530   PHURINE 5.0 11/11/2016 1530   GLUCOSEU NEGATIVE 11/11/2016 1530   HGBUR SMALL (A) 11/11/2016 1530   BILIRUBINUR NEGATIVE 11/11/2016 [place] 11/11/2016 1530   PROTEINUR 100 (A) 11/11/2016 1530   NITRITE NEGATIVE 11/11/2016 1530   LEUKOCYTESUR LARGE (A) 11/11/2016 1530   Sepsis Labs Invalid input(s): PROCALCITONIN,  WBC,  LACTICIDVEN Microbiology Recent Results (from the past 240 hour(s))  MRSA PCR Screening     Status: Abnormal   Collection Time: 03/20/18  2:49 AM  Result Value Ref Range Status   MRSA by PCR POSITIVE (A) NEGATIVE Final    Comment:        The GeneXpert MRSA Assay (FDA approved for NASAL specimens only), is one component of a comprehensive MRSA colonization surveillance program. It is not intended to diagnose MRSA infection nor to guide or monitor treatment for MRSA infections. RESULT CALLED TO, READ BACK BY AND VERIFIED WITH: Clemmie Krill RN [id number] 03/20/18 A BROWNING Performed at [facility], [place] [address] [place], [place]  [address]      Time coordinating discharge: 25 minutes       SIGNED:   Edwin Dada, MD  [facility] 03/21/2018, 10:08 AM

## 2018-03-22 LAB — HEPATITIS B SURFACE ANTIBODY,QUALITATIVE: HEP B S AB: REACTIVE

## 2018-03-22 LAB — HEPATITIS B SURFACE ANTIGEN: Hepatitis B Surface Ag: NEGATIVE

## 2018-05-05 ENCOUNTER — Encounter (HOSPITAL_COMMUNITY): Payer: Self-pay

## 2018-05-05 ENCOUNTER — Emergency Department (HOSPITAL_COMMUNITY)
Admission: EM | Admit: 2018-05-05 | Discharge: 2018-05-05 | Disposition: A | Discharge status: Home / Self Care | Payer: Medicare Other | Attending: Emergency Medicine | Admitting: Emergency Medicine

## 2018-05-05 ENCOUNTER — Other Ambulatory Visit: Payer: Self-pay

## 2018-05-05 DIAGNOSIS — I4891 Unspecified atrial fibrillation: Secondary | ICD-10-CM | POA: Diagnosis not present

## 2018-05-05 DIAGNOSIS — Z992 Dependence on renal dialysis: Secondary | ICD-10-CM | POA: Insufficient documentation

## 2018-05-05 DIAGNOSIS — Z9119 Patient's noncompliance with other medical treatment and regimen: Secondary | ICD-10-CM | POA: Diagnosis not present

## 2018-05-05 DIAGNOSIS — F0390 Unspecified dementia without behavioral disturbance: Secondary | ICD-10-CM | POA: Diagnosis not present

## 2018-05-05 DIAGNOSIS — Z79899 Other long term (current) drug therapy: Secondary | ICD-10-CM | POA: Insufficient documentation

## 2018-05-05 DIAGNOSIS — R0602 Shortness of breath: Secondary | ICD-10-CM | POA: Diagnosis present

## 2018-05-05 DIAGNOSIS — I739 Peripheral vascular disease, unspecified: Secondary | ICD-10-CM | POA: Diagnosis not present

## 2018-05-05 DIAGNOSIS — E1122 Type 2 diabetes mellitus with diabetic chronic kidney disease: Secondary | ICD-10-CM | POA: Insufficient documentation

## 2018-05-05 DIAGNOSIS — Z7901 Long term (current) use of anticoagulants: Secondary | ICD-10-CM | POA: Diagnosis not present

## 2018-05-05 DIAGNOSIS — Z7982 Long term (current) use of aspirin: Secondary | ICD-10-CM | POA: Insufficient documentation

## 2018-05-05 DIAGNOSIS — I120 Hypertensive chronic kidney disease with stage 5 chronic kidney disease or end stage renal disease: Secondary | ICD-10-CM | POA: Insufficient documentation

## 2018-05-05 DIAGNOSIS — N186 End stage renal disease: Secondary | ICD-10-CM | POA: Insufficient documentation

## 2018-05-05 LAB — CBC WITH DIFFERENTIAL/PLATELET
ABS IMMATURE GRANULOCYTES: 0.04 10*3/uL (ref 0.00–0.07)
BASOS PCT: 0 %
Basophils Absolute: 0 10*3/uL (ref 0.0–0.1)
Eosinophils Absolute: 0.4 10*3/uL (ref 0.0–0.5)
Eosinophils Relative: 5 %
HCT: 37.2 % — ABNORMAL LOW (ref 39.0–52.0)
Hemoglobin: 11.2 g/dL — ABNORMAL LOW (ref 13.0–17.0)
IMMATURE GRANULOCYTES: 1 %
Lymphocytes Relative: 11 %
Lymphs Abs: 0.9 10*3/uL (ref 0.7–4.0)
MCH: 27.5 pg (ref 26.0–34.0)
MCHC: 30.1 g/dL (ref 30.0–36.0)
MCV: 91.4 fL (ref 80.0–100.0)
MONOS PCT: 12 %
Monocytes Absolute: 1 10*3/uL (ref 0.1–1.0)
NEUTROS PCT: 71 %
Neutro Abs: 5.9 10*3/uL (ref 1.7–7.7)
Platelets: 186 10*3/uL (ref 150–400)
RBC: 4.07 MIL/uL — ABNORMAL LOW (ref 4.22–5.81)
RDW: 17.9 % — ABNORMAL HIGH (ref 11.5–15.5)
WBC: 8.2 10*3/uL (ref 4.0–10.5)
nRBC: 0 % (ref 0.0–0.2)

## 2018-05-05 LAB — BASIC METABOLIC PANEL
Anion gap: 14 (ref 5–15)
BUN: 60 mg/dL — ABNORMAL HIGH (ref 8–23)
CO2: 21 mmol/L — ABNORMAL LOW (ref 22–32)
Calcium: 9 mg/dL (ref 8.9–10.3)
Chloride: 99 mmol/L (ref 98–111)
Creatinine, Ser: 8.82 mg/dL — ABNORMAL HIGH (ref 0.61–1.24)
GFR calc Af Amer: 6 mL/min — ABNORMAL LOW (ref >60)
GFR calc non Af Amer: 6 mL/min — ABNORMAL LOW (ref >60)
Glucose, Bld: 95 mg/dL (ref 70–99)
Potassium: 5.6 mmol/L — ABNORMAL HIGH (ref 3.5–5.1)
Sodium: 134 mmol/L — ABNORMAL LOW (ref 135–145)

## 2018-05-05 MED ORDER — SODIUM ZIRCONIUM CYCLOSILICATE 10 G PO PACK
10.0000 g | PACK | Freq: Once | ORAL | Status: AC
  Administered 2018-05-05: 10 g via ORAL
  Filled 2018-05-05: qty 1

## 2018-05-05 MED ORDER — SODIUM ZIRCONIUM CYCLOSILICATE 10 G PO PACK: 10.0000 g | PACK | Freq: Every day | ORAL | 0 refills | Status: AC

## 2018-05-05 NOTE — ED Provider Notes (Signed)
[facility] EMERGENCY DEPARTMENT Provider Note   CSN: [id number] Arrival date & time: 05/05/18  1541    History   Chief Complaint Chief Complaint  Patient presents with  . Missed Dialysis    HPI George Burgess is a 68 y.o. male.     History of dementia, end-stage renal disease on hemodialysis sent here as he has missed dialysis.  Tried to contact nursing staff multiple times but they did not pick up.  Due to missing dialysis I think they wanted him to have lab work done.  Patient appears comfortable.  He has no complaints.  No respiratory symptoms.  No signs of volume overload.   Illness  Location:  General Severity:  Mild Onset quality:  Gradual Timing:  Constant Progression:  Unchanged Chronicity:  New Context:  Here for labs possibly due to missing dialysis.    Past Medical History:  Diagnosis Date  . Atrial fibrillation ([place])    a. Chronic Eliquis (CHA2DS2VASc = 6).  . Chronic combined systolic (congestive) and diastolic (congestive) heart failure ([place])    a. Previously reported EF of 30%;  b. 08/2016 Echo: EF 40-45%, antsept, inf, infsept HK, Gr3 DD, mod AI/MR, sev dil LA, mild TR, PASP 6mmHg.  Marland Kitchen Dementia ([place])   . Encephalopathy   . End stage renal disease ([place])    a. On HD.  Marland Kitchen Essential hypertension   . GIB (gastrointestinal bleeding)    a. 08/2016 in setting of Eliquis Rx.  . Pleural effusion   . PVD (peripheral vascular disease) ([place])    a. s/p L BKA;  b. Chronic LE wounds.  . Stroke ([place])   . Type II diabetes mellitus [facility])     Patient Active Problem List   Diagnosis Date Noted  . Dementia ([place])   . Chronic combined systolic (congestive) and diastolic (congestive) heart failure ([place])   . PVD (peripheral vascular disease) ([place])   . Stroke ([place])   . Seizure ([place]) 09/11/2017  . ESRD on hemodialysis ([place]) 09/11/2017  . Elevated troponin 09/11/2017  . Atrial fibrillation, chronic 09/11/2017  . Anemia due to end stage renal disease  ([place]) 09/11/2017  . Essential hypertension 09/11/2017  . Hypoglycemia 09/11/2017  . Syncope 09/10/2017  . Blindness   . Bloody stools   . Encounter for nasogastric (NG) tube placement   . Lower GI bleed   . Malnutrition of moderate degree 09/05/2016  . Acute GI bleeding 09/04/2016    Past Surgical History:  Procedure Laterality Date  . BELOW KNEE LEG AMPUTATION Left   . FLEXIBLE SIGMOIDOSCOPY N/A 09/07/2016   Procedure: FLEXIBLE SIGMOIDOSCOPY;  Surgeon: Manus Gunning, MD;  Location: [facility];  Service: Gastroenterology;  Laterality: N/A;        Home Medications    Prior to Admission medications   Medication Sig Start Date End Date Taking? Authorizing Provider  acetaminophen (TYLENOL) 325 MG tablet Take 650 mg by mouth every 4 (four) hours as needed (for pain).     [provider]  aspirin 81 MG tablet Chew 81 mg by mouth daily.    [provider]  atorvastatin (LIPITOR) 80 MG tablet Take 1 tablet (80 mg total) by mouth daily at 6 PM. 09/13/16   Mikhail, Velta Addison, DO  B Complex-C-Folic Acid (NEPHRO VITAMINS) 0.8 MG TABS Take 1 tablet by mouth daily.    [provider]  calcium carbonate (TUMS EX) 750 MG chewable tablet Chew 1 tablet by mouth See admin instructions. Chew 1 tablet  by mouth three times a day with meals and 1 tablet three times a day with snacks    [provider]  ethyl chloride spray Apply 1 application topically Every Tuesday,Thursday,and Saturday with dialysis.    [provider]  famotidine (PEPCID) 20 MG tablet Take 20 mg by mouth daily.    [provider]  isosorbide mononitrate (IMDUR) 30 MG 24 hr tablet Take 30 mg by mouth every evening.     [provider]  lamoTRIgine (LAMICTAL) 25 MG tablet Take 25 mg by mouth at bedtime.     [provider]  levETIRAcetam (KEPPRA) 500 MG tablet Take 1 tablet (500 mg total) by mouth 2 (two) times daily. 03/21/18   Danford, Suann Larry, MD   metoprolol tartrate (LOPRESSOR) 25 MG tablet Take 25 mg by mouth 2 (two) times daily.    [provider]  nitroGLYCERIN (NITROSTAT) 0.4 MG SL tablet Place 0.4 mg under the tongue every 5 (five) minutes x 3 doses as needed for chest pain.     [provider]  Nutritional Supplements (FEEDING SUPPLEMENT, NEPRO CARB STEADY,) LIQD Take 237 mLs by mouth 2 (two) times daily between meals.     [provider]  sodium zirconium cyclosilicate (LOKELMA) 10 g PACK packet Take 10 g by mouth daily for 2 days. 05/05/18 05/07/18  Lennice Sites, DO    Family History Family History  Problem Relation Age of Onset  . Other Mother        Pt unsure of PMH of family members.    Social History Social History   Tobacco Use  . Smoking status: Former Research scientist (life sciences)  . Smokeless tobacco: Never Used  . Tobacco comment: Pt thinks he used to smoke cigarettes.  Thinks he quit many years ago.  Substance Use Topics  . Alcohol use: No  . Drug use: No     Allergies   Patient has no known allergies.   Review of Systems Review of Systems  Unable to perform ROS: Dementia     Physical Exam Updated Vital Signs  ED Triage Vitals  Enc Vitals Group     BP 05/05/18 1545 115/80     Pulse Rate 05/05/18 1545 86     Resp 05/05/18 1545 18     Temp 05/05/18 1545 97.9 F (36.6 C)     Temp Source 05/05/18 1545 Oral     SpO2 05/05/18 1542 99 %     Weight 05/05/18 1546 180 lb (81.6 kg)     Height 05/05/18 1546 6' (1.829 m)     Head Circumference --      Peak Flow --      Pain Score 05/05/18 1546 0     Pain Loc --      Pain Edu? --      Excl. in [place]? --     Physical Exam Vitals signs and nursing note reviewed.  Constitutional:      Appearance: Normal appearance. He is well-developed.  HENT:     Head: Normocephalic and atraumatic.     Nose: Nose normal.     Mouth/Throat:     Mouth: Mucous membranes are moist.  Eyes:     Extraocular Movements: Extraocular movements intact.      Conjunctiva/sclera: Conjunctivae normal.     Pupils: Pupils are equal, round, and reactive to light.  Neck:     Musculoskeletal: Neck supple.  Cardiovascular:     Rate and Rhythm: Normal rate and regular rhythm.  Pulses: Normal pulses.     Heart sounds: Normal heart sounds. No murmur.  Pulmonary:     Effort: Pulmonary effort is normal. No respiratory distress.     Breath sounds: Normal breath sounds.  Abdominal:     General: Abdomen is flat.     Palpations: Abdomen is soft.     Tenderness: There is no abdominal tenderness.  Skin:    General: Skin is warm and dry.     Capillary Refill: Capillary refill takes less than 2 seconds.  Neurological:     General: No focal deficit present.     Mental Status: He is alert.  Psychiatric:        Mood and Affect: Mood normal.      ED Treatments / Results  Labs (all labs ordered are listed, but only abnormal results are displayed) Labs Reviewed  BASIC METABOLIC PANEL - Abnormal; Notable for the following components:      Result Value   Sodium 134 (*)    Potassium 5.6 (*)    CO2 21 (*)    BUN 60 (*)    Creatinine, Ser 8.82 (*)    GFR calc non Af Amer 6 (*)    GFR calc Af Amer 6 (*)    All other components within normal limits  CBC WITH DIFFERENTIAL/PLATELET - Abnormal; Notable for the following components:   RBC 4.07 (*)    Hemoglobin 11.2 (*)    HCT 37.2 (*)    RDW 17.9 (*)    All other components within normal limits    EKG EKG Interpretation  Date/Time:  Tuesday May 05 2018 15:45:48 EDT Ventricular Rate:  80 PR Interval:    QRS Duration: 104 QT Interval:  401 QTC Calculation: 463 R Axis:   -96 Text Interpretation:  Atrial flutter with predominant 3:1 AV block Left anterior fascicular block Borderline T abnormalities, lateral leads Baseline wander in lead(s) I III aVL Confirmed by Lennice Sites [phone]) on 05/05/2018 3:58:58 PM   Radiology No results found.  Procedures Procedures (including critical care time)   Medications Ordered in ED Medications  sodium zirconium cyclosilicate (LOKELMA) packet 10 g (has no administration in time range)     Initial Impression / Assessment and Plan / ED Course  I have reviewed the triage vital signs and the nursing notes.  Pertinent labs & imaging results that were available during my care of the patient were reviewed by me and considered in my medical decision making (see chart for details).     George Burgess is a 68 year old male with history of end-stage renal disease, dementia who presents to the ED for evaluation for possible need for dialysis.  Patient with normal vitals.  No fever.  Patient with no complaints.  Normal work of breathing.  He states that he has not wanted to go to dialysis because he just wants a break from it.  Patient does have a history of mild dementia.  However he appears alert and oriented.  He states that he is willing to go to dialysis on Thursday.  He understands why he needs it.  Lab work showed some mild elevation of his potassium to 5.6 but there is slight hemolysis.  EKG is reassuring.  Lab work otherwise unremarkable.  Contacted nephrology and they agree with sending patient back to his facility for dialysis tomorrow Thursday.  Patient was given [facility] in the ED.  Given rx for Lokelma at home x 2 days..  If patient is refusing to go  to dialysis and has capacity to do this there needs to be shared decision with family, primary care doctor, primary nephrologist about whether or not to continue patient on dialysis or to just allow him to be comfort care.  Patient was discharged from the ED in good condition.  This chart was dictated using voice recognition software.  Despite best efforts to proofread,  errors can occur which can change the documentation meaning.    Final Clinical Impressions(s) / ED Diagnoses   Final diagnoses:  SOB (shortness of breath)    ED Discharge Orders         Ordered    sodium zirconium  cyclosilicate (LOKELMA) 10 g PACK packet  Daily     05/05/18 1710           [place], DO 05/05/18 1718

## 2018-05-05 NOTE — ED Triage Notes (Addendum)
Pt arrives via [facility] from [facility], per EMS pt has not had dialysis in 1 week. Pt reports he "just needed a rest from it." Pt denies any pain or SOB. Pt in NAD. Pt restricted to left side, has dialysis port as well. Pt alert and oriented. Pt supposed to have dialysis t, th, sat. Pt believes last dialysis on 3/10. Hx dementia, alert and orient x4. Pt had surgery on 3/13 r/t left arm graft.

## 2018-05-05 NOTE — ED Notes (Signed)
PTAR given discharge paperwork, prescriptions, and discharge/transfer paperwork. RN attempted to call [facility] again with no answer.

## 2018-05-05 NOTE — ED Notes (Signed)
ED Provider at bedside. 

## 2018-05-05 NOTE — ED Notes (Signed)
Patient verbalizes understanding of discharge instructions. Opportunity for questioning and answers were provided. Armband removed by staff, pt discharged from ED. Pt awaiting PTAR for return to [facility] and rehab.

## 2018-05-05 NOTE — ED Notes (Addendum)
RN attempted to call [facility] x2 with no answer to give report and let no of disposition. RN to give report to PTAR on arrival.

## 2018-05-05 NOTE — Discharge Instructions (Signed)
Please call patient's family and have a discussion with family and with the patient about whether or not he wants to continue dialysis.  If there is a concern about dementia and his inability to make decisions for himself this needs to be discussed with family and patient's primary doctor.  He does not meet any criteria for emergent dialysis at this time.  My conversation with the patient is that he is willing to go to dialysis on Thursday.  If he is able to go tomorrow I recommend that.

## 2018-07-30 ENCOUNTER — Emergency Department (HOSPITAL_COMMUNITY)
Admission: EM | Admit: 2018-07-30 | Discharge: 2018-07-30 | Disposition: A | Discharge status: Home / Self Care | Payer: Medicare Other | Attending: Emergency Medicine | Admitting: Emergency Medicine

## 2018-07-30 ENCOUNTER — Other Ambulatory Visit: Payer: Self-pay

## 2018-07-30 DIAGNOSIS — Z4901 Encounter for fitting and adjustment of extracorporeal dialysis catheter: Secondary | ICD-10-CM | POA: Insufficient documentation

## 2018-07-30 DIAGNOSIS — I739 Peripheral vascular disease, unspecified: Secondary | ICD-10-CM | POA: Insufficient documentation

## 2018-07-30 DIAGNOSIS — Z992 Dependence on renal dialysis: Secondary | ICD-10-CM | POA: Diagnosis not present

## 2018-07-30 DIAGNOSIS — Z79899 Other long term (current) drug therapy: Secondary | ICD-10-CM | POA: Diagnosis not present

## 2018-07-30 DIAGNOSIS — F0390 Unspecified dementia without behavioral disturbance: Secondary | ICD-10-CM | POA: Diagnosis not present

## 2018-07-30 DIAGNOSIS — Z7982 Long term (current) use of aspirin: Secondary | ICD-10-CM | POA: Diagnosis not present

## 2018-07-30 DIAGNOSIS — N186 End stage renal disease: Secondary | ICD-10-CM | POA: Diagnosis not present

## 2018-07-30 DIAGNOSIS — T829XXA Unspecified complication of cardiac and vascular prosthetic device, implant and graft, initial encounter: Secondary | ICD-10-CM

## 2018-07-30 DIAGNOSIS — I5042 Chronic combined systolic (congestive) and diastolic (congestive) heart failure: Secondary | ICD-10-CM | POA: Diagnosis not present

## 2018-07-30 DIAGNOSIS — E1122 Type 2 diabetes mellitus with diabetic chronic kidney disease: Secondary | ICD-10-CM | POA: Diagnosis not present

## 2018-07-30 DIAGNOSIS — I132 Hypertensive heart and chronic kidney disease with heart failure and with stage 5 chronic kidney disease, or end stage renal disease: Secondary | ICD-10-CM | POA: Diagnosis not present

## 2018-07-30 NOTE — ED Notes (Signed)
Attempted report to [facility] with no answer. Materials and education provided to patient

## 2018-07-30 NOTE — ED Triage Notes (Signed)
Pt went to dialysis today (schedule: Tue, Thur, Sat) & around 1530 his access on his Lt arm began bleeding a steady trickle & came to ED once he could not get it to stop, VSS, A/Ox4.

## 2018-07-30 NOTE — ED Notes (Addendum)
EDP at bedside, a stitch was put in ... this did stop the bleeding.

## 2018-07-30 NOTE — ED Notes (Signed)
Checked on pt's graft site. There is a very slight amount of bleeding at stitch site. Will continue to monitor

## 2018-07-30 NOTE — Discharge Instructions (Signed)
FOLLOW UP WITH PCP OR NEPHROLOGIST IN 5 DAYS FOR SUTURE REMOVAL. RETURN TO ER IF ANY FURTHER BLEEDING.

## 2018-07-30 NOTE — ED Provider Notes (Addendum)
[facility] EMERGENCY DEPARTMENT Provider Note   CSN: [id number] Arrival date & time: 07/30/18  1812    History   Chief Complaint Chief Complaint  Patient presents with  . Bleeding Shunt    HPI George Burgess is a 68 y.o. male.     44-year-old male with extensive past medical history including ESRD on HD, hypertension, PVD, stroke, type 2 diabetes mellitus, dementia, CHF, atrial fibrillation who presents with bleeding fistula.  Patient had routine dialysis today which went normally.  He had no problems upon discharge home but then around 330pm he began having bleeding from his left arm fistula site.  No trauma.  He denies any other complaints.  The history is provided by the patient.    Past Medical History:  Diagnosis Date  . Atrial fibrillation ([place])    a. Chronic Eliquis (CHA2DS2VASc = 6).  . Chronic combined systolic (congestive) and diastolic (congestive) heart failure ([place])    a. Previously reported EF of 30%;  b. 08/2016 Echo: EF 40-45%, antsept, inf, infsept HK, Gr3 DD, mod AI/MR, sev dil LA, mild TR, PASP 41mmHg.  Marland Kitchen Dementia ([place])   . Encephalopathy   . End stage renal disease ([name])    a. On HD.  Marland Kitchen Essential hypertension   . GIB (gastrointestinal bleeding)    a. 08/2016 in setting of Eliquis Rx.  . Pleural effusion   . PVD (peripheral vascular disease) ([place])    a. s/p L BKA;  b. Chronic LE wounds.  . Stroke ([place])   . Type II diabetes mellitus [facility])     Patient Active Problem List   Diagnosis Date Noted  . Dementia ([place])   . Chronic combined systolic (congestive) and diastolic (congestive) heart failure ([place])   . PVD (peripheral vascular disease) ([place])   . Stroke ([place])   . Seizure ([place]) 09/11/2017  . ESRD on hemodialysis ([place]) 09/11/2017  . Elevated troponin 09/11/2017  . Atrial fibrillation, chronic 09/11/2017  . 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[place]) 09/11/2017  . Essential hypertension 09/11/2017  . Hypoglycemia 09/11/2017  .  Syncope 09/10/2017  . Blindness   . Bloody stools   . Encounter for nasogastric (NG) tube placement   . Lower GI bleed   . Malnutrition of moderate degree 09/05/2016  . Acute GI bleeding 09/04/2016    Past Surgical History:  Procedure Laterality Date  . BELOW KNEE LEG AMPUTATION Left   . FLEXIBLE SIGMOIDOSCOPY N/A 09/07/2016   Procedure: FLEXIBLE SIGMOIDOSCOPY;  Surgeon: Manus Gunning, MD;  Location: [facility];  Service: Gastroenterology;  Laterality: N/A;        Home Medications    Prior to Admission medications   Medication Sig Start Date End Date Taking? Authorizing Provider  acetaminophen (TYLENOL) 325 MG tablet Take 650 mg by mouth every 4 (four) hours as needed (for pain).     [provider]  aspirin 81 MG tablet Chew 81 mg by mouth daily.    [provider]  atorvastatin (LIPITOR) 80 MG tablet Take 1 tablet (80 mg total) by mouth daily at 6 PM. 09/13/16   Mikhail, Velta Addison, DO  B Complex-C-Folic Acid (NEPHRO VITAMINS) 0.8 MG TABS Take 1 tablet by mouth daily.    [provider]  calcium carbonate (TUMS EX) 750 MG chewable tablet Chew 1 tablet by mouth See admin instructions. Chew 1 tablet by mouth three times a day with meals and 1 tablet three times a day with snacks    [provider]  ethyl chloride spray Apply 1 application topically Every Tuesday,Thursday,and Saturday with dialysis.    [provider]  famotidine (PEPCID) 20 MG tablet Take 20 mg by mouth daily.    [provider]  isosorbide mononitrate (IMDUR) 30 MG 24 hr tablet Take 30 mg by mouth every evening.     [provider]  lamoTRIgine (LAMICTAL) 25 MG tablet Take 25 mg by mouth at bedtime.     [provider]  levETIRAcetam (KEPPRA) 500 MG tablet Take 1 tablet (500 mg total) by mouth 2 (two) times daily. 03/21/18   Danford, Suann Larry, MD  metoprolol tartrate (LOPRESSOR) 25 MG tablet Take 25 mg by mouth 2 (two) times daily.     [provider]  nitroGLYCERIN (NITROSTAT) 0.4 MG SL tablet Place 0.4 mg under the tongue every 5 (five) minutes x 3 doses as needed for chest pain.     [provider]  Nutritional Supplements (FEEDING SUPPLEMENT, NEPRO CARB STEADY,) LIQD Take 237 mLs by mouth 2 (two) times daily between meals.     [provider]    Family History Family History  Problem Relation Age of Onset  . Other Mother        Pt unsure of PMH of family members.    Social History Social History   Tobacco Use  . Smoking status: Former Research scientist (life sciences)  . Smokeless tobacco: Never Used  . Tobacco comment: Pt thinks he used to smoke cigarettes.  Thinks he quit many years ago.  Substance Use Topics  . Alcohol use: No  . Drug use: No     Allergies   Patient has no known allergies.   Review of Systems Review of Systems  Constitutional: Negative for fever.  Skin: Positive for wound.  Neurological: Negative for numbness.     Physical Exam Updated Vital Signs BP 136/76   Pulse 73   Temp 98.3 F (36.8 C) (Oral)   Resp 16   Ht 6' (1.829 m)   Wt 83.9 kg   SpO2 100%   BMI 25.09 kg/m   Physical Exam Vitals signs and nursing note reviewed.  Constitutional:      General: He is not in acute distress.    Appearance: He is well-developed.  HENT:     Head: Normocephalic and atraumatic.  Eyes:     Conjunctiva/sclera: Conjunctivae normal.  Neck:     Musculoskeletal: Neck supple.  Musculoskeletal:        General: No swelling or tenderness.     Comments: AV fistula LUE with palpable thrill, punctate area of arterial bleeding  Skin:    General: Skin is warm and dry.  Neurological:     Mental Status: He is alert and oriented to person, place, and time.  Psychiatric:        Judgment: Judgment normal.      ED Treatments / Results  Labs (all labs ordered are listed, but only abnormal results are displayed) Labs Reviewed - No data to display  EKG None  Radiology No results  found.  Procedures .Marland KitchenLaceration Repair  Date/Time: 07/30/2018 7:46 PM Performed by: Sharlett Iles, MD Authorized by: Sharlett Iles, MD   Consent:    Consent obtained:  Verbal   Consent given by:  Patient   Risks discussed:  Pain and infection Anesthesia (see MAR for exact dosages):    Anesthesia method:  Local infiltration   Local anesthetic:  Lidocaine 2% WITH epi Laceration details:    Location:  Shoulder/arm   Shoulder/arm location:  L upper arm   Length (cm):  0.2 Repair type:    Repair type:  Simple Treatment:    Area cleansed with:  Shur-Clens Skin repair:    Repair method:  Sutures   Suture size:  4-0   Suture material:  Nylon   Suture technique:  Figure eight   Number of sutures:  1 Post-procedure details:    Dressing:  Non-adherent dressing   Patient tolerance of procedure:  Tolerated well, no immediate complications   (including critical care time)  Medications Ordered in ED Medications - No data to display   Initial Impression / Assessment and Plan / ED Course  I have reviewed the triage vital signs and the nursing notes.         PT with bleeding likely from puncture site. No success with pressure dressing or combat gauze, therefore placed a single very superficial suture in skin with hemostasis achieved. Observed in ED with no further bleeding.  Directed to follow-up either at dialysis clinic, with PCP, or with vascular surgery clinic for suture removal. Return precautions reviewed.  Final Clinical Impressions(s) / ED Diagnoses   Final diagnoses:  Complication of arteriovenous dialysis fistula, initial encounter    ED Discharge Orders    None       Thai Burgueno, Wenda Overland, MD 07/30/18 1947    Rex Kras, Wenda Overland, MD 08/10/18 [phone]

## 2018-10-09 ENCOUNTER — Encounter (HOSPITAL_BASED_OUTPATIENT_CLINIC_OR_DEPARTMENT_OTHER): Payer: Self-pay | Admitting: Emergency Medicine

## 2018-10-09 ENCOUNTER — Other Ambulatory Visit: Payer: Self-pay

## 2018-10-09 ENCOUNTER — Emergency Department (HOSPITAL_BASED_OUTPATIENT_CLINIC_OR_DEPARTMENT_OTHER)
Admission: EM | Admit: 2018-10-09 | Discharge: 2018-10-09 | Disposition: A | Discharge status: Skilled Nursing Facility | Payer: Medicare Other | Attending: Emergency Medicine | Admitting: Emergency Medicine

## 2018-10-09 DIAGNOSIS — E1122 Type 2 diabetes mellitus with diabetic chronic kidney disease: Secondary | ICD-10-CM | POA: Insufficient documentation

## 2018-10-09 DIAGNOSIS — T82838A Hemorrhage of vascular prosthetic devices, implants and grafts, initial encounter: Secondary | ICD-10-CM | POA: Diagnosis not present

## 2018-10-09 DIAGNOSIS — I132 Hypertensive heart and chronic kidney disease with heart failure and with stage 5 chronic kidney disease, or end stage renal disease: Secondary | ICD-10-CM | POA: Diagnosis not present

## 2018-10-09 DIAGNOSIS — Y828 Other medical devices associated with adverse incidents: Secondary | ICD-10-CM | POA: Diagnosis not present

## 2018-10-09 DIAGNOSIS — Z8673 Personal history of transient ischemic attack (TIA), and cerebral infarction without residual deficits: Secondary | ICD-10-CM | POA: Insufficient documentation

## 2018-10-09 DIAGNOSIS — Z7982 Long term (current) use of aspirin: Secondary | ICD-10-CM | POA: Insufficient documentation

## 2018-10-09 DIAGNOSIS — Z7984 Long term (current) use of oral hypoglycemic drugs: Secondary | ICD-10-CM | POA: Diagnosis not present

## 2018-10-09 DIAGNOSIS — Z79899 Other long term (current) drug therapy: Secondary | ICD-10-CM | POA: Insufficient documentation

## 2018-10-09 DIAGNOSIS — Z992 Dependence on renal dialysis: Secondary | ICD-10-CM | POA: Insufficient documentation

## 2018-10-09 DIAGNOSIS — I5042 Chronic combined systolic (congestive) and diastolic (congestive) heart failure: Secondary | ICD-10-CM | POA: Diagnosis not present

## 2018-10-09 DIAGNOSIS — F0390 Unspecified dementia without behavioral disturbance: Secondary | ICD-10-CM | POA: Diagnosis not present

## 2018-10-09 DIAGNOSIS — Z7901 Long term (current) use of anticoagulants: Secondary | ICD-10-CM | POA: Insufficient documentation

## 2018-10-09 DIAGNOSIS — N186 End stage renal disease: Secondary | ICD-10-CM | POA: Insufficient documentation

## 2018-10-09 DIAGNOSIS — I482 Chronic atrial fibrillation, unspecified: Secondary | ICD-10-CM | POA: Insufficient documentation

## 2018-10-09 MED ORDER — "THROMBI-PAD 3""X3"" EX PADS": 1.0000 | MEDICATED_PAD | Freq: Once | CUTANEOUS | Status: DC

## 2018-10-09 NOTE — Discharge Instructions (Signed)
You were seen in the ED today for bleeding to your dialysis fistula We have controlled the bleeding in the ED Please keep ace bandage on your arm overnight  Return to the ED IMMEDIATELY if you begin to notice anymore bleeding to the area; this is considered a medical emergency.

## 2018-10-09 NOTE — ED Notes (Signed)
Pt. Fistula site is clean and dry with no bleeding noted.

## 2018-10-09 NOTE — ED Notes (Signed)
[facility] place and made aware pt was returning. Also informed staff pt's wheel chair needed to be picked up by their van in the morning due to pt returning via [facility].

## 2018-10-09 NOTE — ED Triage Notes (Signed)
Per EMS: Pt had dialysis today and his fistula would not stop bleeding.  Pressure dressing in place, seems to be working.  NAD

## 2018-10-09 NOTE — ED Provider Notes (Signed)
[facility]ERGENCY [facility]PARTMENT Provider Note   CSN: [id number] Arrival date & time: 10/09/18  1736     History   Chief Complaint Chief Complaint  Patient presents with  . Fistula bleeding    HPI Hridhaan Yohn is a 68 y.o. male with PMHx diabetes, HTN, dementia, A fib, ESRD on dialysis M,W,F, who presents to the ED via EMS for bleeding to his fistula site that began earlier today while at dialysis. Pt was sent here after they were unable to stop bleeding with direct pressure. Pt states he typically does have some issues with bleeding during dialysis but he is not a very good historian. He has no other complaints at this time.        Past Medical History:  Diagnosis Date  . Atrial fibrillation ([place])    a. Chronic Eliquis (CHA2DS2VASc = 6).  . Chronic combined systolic (congestive) and diastolic (congestive) heart failure ([place])    a. Previously reported EF of 30%;  b. 08/2016 Echo: EF 40-45%, antsept, inf, infsept HK, Gr3 DD, mod AI/MR, sev dil LA, mild TR, PASP 30mmHg.  Marland Kitchen Dementia ([place])   . Encephalopathy   . End stage renal disease ([place])    a. On HD.  Marland Kitchen Essential hypertension   . GIB (gastrointestinal bleeding)    a. 08/2016 in setting of Eliquis Rx.  . Pleural effusion   . PVD (peripheral vascular disease) ([place])    a. s/p L BKA;  b. Chronic LE wounds.  . Stroke ([place])   . Type II diabetes mellitus [facility])     Patient Active Problem List   Diagnosis Date Noted  . Dementia ([place])   . Chronic combined systolic (congestive) and diastolic (congestive) heart failure ([place])   . PVD (peripheral vascular disease) ([place])   . Stroke ([place])   . Seizure ([place]) 09/11/2017  . ESRD on hemodialysis ([place]) 09/11/2017  . Elevated troponin 09/11/2017  . Atrial fibrillation, chronic 09/11/2017  . 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[place]) 09/11/2017  . Essential hypertension 09/11/2017  . Hypoglycemia 09/11/2017  . Syncope 09/10/2017  . Blindness   . Bloody stools   .  Encounter for nasogastric (NG) tube placement   . Lower GI bleed   . Malnutrition of moderate degree 09/05/2016  . Acute GI bleeding 09/04/2016    Past Surgical History:  Procedure Laterality Date  . BELOW KNEE LEG AMPUTATION Left   . FLEXIBLE SIGMOIDOSCOPY N/A 09/07/2016   Procedure: FLEXIBLE SIGMOIDOSCOPY;  Surgeon: Manus Gunning, MD;  Location: [facility];  Service: Gastroenterology;  Laterality: N/A;        Home Medications    Prior to Admission medications   Medication Sig Start Date End Date Taking? Authorizing Provider  acetaminophen (TYLENOL) 325 MG tablet Take 650 mg by mouth every 4 (four) hours as needed (for pain).     [provider]  aspirin 81 MG tablet Chew 81 mg by mouth daily.    [provider]  atorvastatin (LIPITOR) 80 MG tablet Take 1 tablet (80 mg total) by mouth daily at 6 PM. 09/13/16   Mikhail, Velta Addison, DO  B Complex-C-Folic Acid (NEPHRO VITAMINS) 0.8 MG TABS Take 1 tablet by mouth daily.    [provider]  calcium carbonate (TUMS EX) 750 MG chewable tablet Chew 1 tablet by mouth See admin instructions. Chew 1 tablet by mouth three times a day with meals and 1 tablet three times a day with snacks    [provider]  ethyl chloride spray Apply 1 application topically Every Tuesday,Thursday,and Saturday with dialysis.    [provider]  famotidine (PEPCID) 20 MG tablet Take 20 mg by mouth daily.    [provider]  isosorbide mononitrate (IMDUR) 30 MG 24 hr tablet Take 30 mg by mouth every evening.     [provider]  lamoTRIgine (LAMICTAL) 25 MG tablet Take 25 mg by mouth at bedtime.     [provider]  levETIRAcetam (KEPPRA) 500 MG tablet Take 1 tablet (500 mg total) by mouth 2 (two) times daily. 03/21/18   Danford, Suann Larry, MD  metoprolol tartrate (LOPRESSOR) 25 MG tablet Take 25 mg by mouth 2 (two) times daily.    [provider]  nitroGLYCERIN (NITROSTAT) 0.4  MG SL tablet Place 0.4 mg under the tongue every 5 (five) minutes x 3 doses as needed for chest pain.     [provider]  Nutritional Supplements (FEEDING SUPPLEMENT, NEPRO CARB STEADY,) LIQD Take 237 mLs by mouth 2 (two) times daily between meals.     [provider]    Family History Family History  Problem Relation Age of Onset  . Other Mother        Pt unsure of PMH of family members.    Social History Social History   Tobacco Use  . Smoking status: Former Research scientist (life sciences)  . Smokeless tobacco: Never Used  . Tobacco comment: Pt thinks he used to smoke cigarettes.  Thinks he quit many years ago.  Substance Use Topics  . Alcohol use: No  . Drug use: No     Allergies   Patient has no known allergies.   Review of Systems Review of Systems  Constitutional: Negative for chills and fever.  Hematological: Bruises/bleeds easily.     Physical Exam Updated Vital Signs BP (!) 155/91   Pulse 73   Temp (!) 97.5 F (36.4 C) (Oral)   Ht 6' (1.829 m)   Wt 81.6 kg   SpO2 100%   BMI 24.41 kg/m   Physical Exam Vitals signs and nursing note reviewed.  Constitutional:      Appearance: He is not ill-appearing.  HENT:     Head: Normocephalic and atraumatic.  Eyes:     Conjunctiva/sclera: Conjunctivae normal.  Neck:     Musculoskeletal: Neck supple.  Cardiovascular:     Rate and Rhythm: Normal rate and regular rhythm.  Pulmonary:     Effort: Pulmonary effort is normal.     Breath sounds: Normal breath sounds.  Abdominal:     Palpations: Abdomen is soft.     Tenderness: There is no abdominal tenderness.  Musculoskeletal:     Comments: AV fistula noted to left forearm. Combat guaze and ace bandage in place.   Skin:    General: Skin is warm and dry.  Neurological:     Mental Status: He is alert.      ED Treatments / Results  Labs (all labs ordered are listed, but only abnormal results are displayed) Labs Reviewed - No data to display  EKG None   Radiology No results found.  Procedures Procedures (including critical care time)  Medications Ordered in ED Medications  Thrombi-Pad 3"X3" pad 1 each (0 each Topical Hold 10/09/18 2004)     Initial Impression / Assessment and Plan / ED Course  I have reviewed the triage vital signs and the nursing notes.  Pertinent labs & imaging results that were available during my care of the patient were reviewed  by me and considered in my medical decision making (see chart for details).    The 64-year-old male who presents to the ED with complaints of bleeding to his fistula site after dialysis this a.m.  Able to control at dialysis so was sent here for further evaluation.  Bandage was applied via EMS with some control.  After about 10 minutes in the ED patient began to bleed through bandage.  Combat gauze with Ace bandage was applied.  For about 1 an hour it was reevaluated.  Patient was still bleeding slightly.  Combat gauze and Ace bandage reapplied.  Evaluated again after about an hour.  It does appear that the bleeding has ceased.  There are no signs of ulcerations in the skin.  Will have patient be observed for a little while longer prior to discharge.  Pt remained in the ED for over 3 hours without any continuation of bleeding to his site. Discharged home at this time. Advised to return to the ED immediately if he noticed anymore bleeding to the area. Pt is in agreement with plan at this time and stable for discharge home.        Final Clinical Impressions(s) / ED Diagnoses   Final diagnoses:  Bleeding from dialysis shunt, initial encounter [facility])    ED Discharge Orders    None       Eustaquio Maize, PA-C 10/09/18 2222    Malvin Johns, MD 10/09/18 2224

## 2018-10-09 NOTE — ED Notes (Signed)
Contacted [facility] for patient transport to [facility]

## 2018-10-09 NOTE — ED Triage Notes (Signed)
Pt at dialysis today.  Fistula would not stop bleeding.  Bandage in place started bleeding while in room.  Combat guaze applied and wrapped with coban, bleeding controlled at this time.

## 2019-06-20 ENCOUNTER — Other Ambulatory Visit: Payer: Self-pay

## 2019-06-20 ENCOUNTER — Encounter (HOSPITAL_COMMUNITY): Payer: Self-pay | Admitting: Cardiology

## 2019-06-20 ENCOUNTER — Inpatient Hospital Stay (HOSPITAL_COMMUNITY)
Admission: EM | Admit: 2019-06-20 | Discharge: 2019-06-22 | DRG: 246 | Disposition: A | Discharge status: Skilled Nursing Facility | Payer: Medicare Other | Attending: Interventional Cardiology | Admitting: Interventional Cardiology

## 2019-06-20 ENCOUNTER — Emergency Department (HOSPITAL_COMMUNITY): Payer: Medicare Other

## 2019-06-20 ENCOUNTER — Encounter (HOSPITAL_COMMUNITY)
Admission: EM | Disposition: A | Discharge status: Skilled Nursing Facility | Payer: Self-pay | Source: Home / Self Care | Attending: Cardiology

## 2019-06-20 DIAGNOSIS — I2102 ST elevation (STEMI) myocardial infarction involving left anterior descending coronary artery: Secondary | ICD-10-CM

## 2019-06-20 DIAGNOSIS — R0602 Shortness of breath: Secondary | ICD-10-CM

## 2019-06-20 DIAGNOSIS — E875 Hyperkalemia: Secondary | ICD-10-CM | POA: Diagnosis not present

## 2019-06-20 DIAGNOSIS — I5043 Acute on chronic combined systolic (congestive) and diastolic (congestive) heart failure: Secondary | ICD-10-CM | POA: Diagnosis present

## 2019-06-20 DIAGNOSIS — G40909 Epilepsy, unspecified, not intractable, without status epilepticus: Secondary | ICD-10-CM | POA: Diagnosis present

## 2019-06-20 DIAGNOSIS — Z955 Presence of coronary angioplasty implant and graft: Secondary | ICD-10-CM

## 2019-06-20 DIAGNOSIS — I4892 Unspecified atrial flutter: Secondary | ICD-10-CM | POA: Diagnosis present

## 2019-06-20 DIAGNOSIS — Z7982 Long term (current) use of aspirin: Secondary | ICD-10-CM | POA: Diagnosis not present

## 2019-06-20 DIAGNOSIS — I213 ST elevation (STEMI) myocardial infarction of unspecified site: Secondary | ICD-10-CM | POA: Diagnosis present

## 2019-06-20 DIAGNOSIS — E1122 Type 2 diabetes mellitus with diabetic chronic kidney disease: Secondary | ICD-10-CM | POA: Diagnosis present

## 2019-06-20 DIAGNOSIS — Z89512 Acquired absence of left leg below knee: Secondary | ICD-10-CM

## 2019-06-20 DIAGNOSIS — F0391 Unspecified dementia with behavioral disturbance: Secondary | ICD-10-CM | POA: Diagnosis not present

## 2019-06-20 DIAGNOSIS — Z79899 Other long term (current) drug therapy: Secondary | ICD-10-CM

## 2019-06-20 DIAGNOSIS — I5042 Chronic combined systolic (congestive) and diastolic (congestive) heart failure: Secondary | ICD-10-CM | POA: Diagnosis present

## 2019-06-20 DIAGNOSIS — E785 Hyperlipidemia, unspecified: Secondary | ICD-10-CM | POA: Diagnosis present

## 2019-06-20 DIAGNOSIS — Z8673 Personal history of transient ischemic attack (TIA), and cerebral infarction without residual deficits: Secondary | ICD-10-CM | POA: Diagnosis not present

## 2019-06-20 DIAGNOSIS — Z79890 Hormone replacement therapy (postmenopausal): Secondary | ICD-10-CM | POA: Diagnosis not present

## 2019-06-20 DIAGNOSIS — D631 Anemia in chronic kidney disease: Secondary | ICD-10-CM | POA: Diagnosis present

## 2019-06-20 DIAGNOSIS — R072 Precordial pain: Secondary | ICD-10-CM

## 2019-06-20 DIAGNOSIS — I252 Old myocardial infarction: Secondary | ICD-10-CM | POA: Diagnosis present

## 2019-06-20 DIAGNOSIS — I482 Chronic atrial fibrillation, unspecified: Secondary | ICD-10-CM | POA: Diagnosis present

## 2019-06-20 DIAGNOSIS — Z7901 Long term (current) use of anticoagulants: Secondary | ICD-10-CM

## 2019-06-20 DIAGNOSIS — I2109 ST elevation (STEMI) myocardial infarction involving other coronary artery of anterior wall: Secondary | ICD-10-CM | POA: Diagnosis present

## 2019-06-20 DIAGNOSIS — I10 Essential (primary) hypertension: Secondary | ICD-10-CM | POA: Diagnosis present

## 2019-06-20 DIAGNOSIS — R778 Other specified abnormalities of plasma proteins: Secondary | ICD-10-CM

## 2019-06-20 DIAGNOSIS — Z992 Dependence on renal dialysis: Secondary | ICD-10-CM

## 2019-06-20 DIAGNOSIS — Z87891 Personal history of nicotine dependence: Secondary | ICD-10-CM

## 2019-06-20 DIAGNOSIS — I5021 Acute systolic (congestive) heart failure: Secondary | ICD-10-CM | POA: Diagnosis not present

## 2019-06-20 DIAGNOSIS — I480 Paroxysmal atrial fibrillation: Secondary | ICD-10-CM | POA: Diagnosis present

## 2019-06-20 DIAGNOSIS — Z20822 Contact with and (suspected) exposure to covid-19: Secondary | ICD-10-CM | POA: Diagnosis present

## 2019-06-20 DIAGNOSIS — E8889 Other specified metabolic disorders: Secondary | ICD-10-CM | POA: Diagnosis present

## 2019-06-20 DIAGNOSIS — I739 Peripheral vascular disease, unspecified: Secondary | ICD-10-CM | POA: Diagnosis not present

## 2019-06-20 DIAGNOSIS — N186 End stage renal disease: Secondary | ICD-10-CM | POA: Diagnosis present

## 2019-06-20 DIAGNOSIS — E1151 Type 2 diabetes mellitus with diabetic peripheral angiopathy without gangrene: Secondary | ICD-10-CM | POA: Diagnosis present

## 2019-06-20 DIAGNOSIS — I2510 Atherosclerotic heart disease of native coronary artery without angina pectoris: Secondary | ICD-10-CM | POA: Diagnosis present

## 2019-06-20 DIAGNOSIS — R54 Age-related physical debility: Secondary | ICD-10-CM | POA: Diagnosis present

## 2019-06-20 DIAGNOSIS — I214 Non-ST elevation (NSTEMI) myocardial infarction: Secondary | ICD-10-CM

## 2019-06-20 DIAGNOSIS — I340 Nonrheumatic mitral (valve) insufficiency: Secondary | ICD-10-CM | POA: Diagnosis not present

## 2019-06-20 DIAGNOSIS — I132 Hypertensive heart and chronic kidney disease with heart failure and with stage 5 chronic kidney disease, or end stage renal disease: Secondary | ICD-10-CM | POA: Diagnosis present

## 2019-06-20 DIAGNOSIS — I361 Nonrheumatic tricuspid (valve) insufficiency: Secondary | ICD-10-CM | POA: Diagnosis not present

## 2019-06-20 DIAGNOSIS — I4510 Unspecified right bundle-branch block: Secondary | ICD-10-CM | POA: Diagnosis present

## 2019-06-20 HISTORY — DX: Hyperlipidemia, unspecified: E78.5

## 2019-06-20 HISTORY — PX: CORONARY/GRAFT ACUTE MI REVASCULARIZATION: CATH118305

## 2019-06-20 HISTORY — PX: LEFT HEART CATH AND CORONARY ANGIOGRAPHY: CATH118249

## 2019-06-20 LAB — CBC WITH DIFFERENTIAL/PLATELET
Abs Immature Granulocytes: 0.01 10*3/uL (ref 0.00–0.07)
Basophils Absolute: 0 10*3/uL (ref 0.0–0.1)
Basophils Relative: 1 %
Eosinophils Absolute: 0.1 10*3/uL (ref 0.0–0.5)
Eosinophils Relative: 2 %
HCT: 40.5 % (ref 39.0–52.0)
Hemoglobin: 12.4 g/dL — ABNORMAL LOW (ref 13.0–17.0)
Immature Granulocytes: 0 %
Lymphocytes Relative: 14 %
Lymphs Abs: 0.9 10*3/uL (ref 0.7–4.0)
MCH: 30 pg (ref 26.0–34.0)
MCHC: 30.6 g/dL (ref 30.0–36.0)
MCV: 97.8 fL (ref 80.0–100.0)
Monocytes Absolute: 0.8 10*3/uL (ref 0.1–1.0)
Monocytes Relative: 12 %
Neutro Abs: 4.7 10*3/uL (ref 1.7–7.7)
Neutrophils Relative %: 71 %
Platelets: 115 10*3/uL — ABNORMAL LOW (ref 150–400)
RBC: 4.14 MIL/uL — ABNORMAL LOW (ref 4.22–5.81)
RDW: 15.6 % — ABNORMAL HIGH (ref 11.5–15.5)
WBC: 6.7 10*3/uL (ref 4.0–10.5)
nRBC: 0 % (ref 0.0–0.2)

## 2019-06-20 LAB — PROTIME-INR
INR: 1.3 — ABNORMAL HIGH (ref 0.8–1.2)
Prothrombin Time: 15.8 seconds — ABNORMAL HIGH (ref 11.4–15.2)

## 2019-06-20 LAB — COMPREHENSIVE METABOLIC PANEL
ALT: 23 U/L (ref 0–44)
AST: 26 U/L (ref 15–41)
Albumin: 4.1 g/dL (ref 3.5–5.0)
Alkaline Phosphatase: 107 U/L (ref 38–126)
Anion gap: 18 — ABNORMAL HIGH (ref 5–15)
BUN: 46 mg/dL — ABNORMAL HIGH (ref 8–23)
CO2: 20 mmol/L — ABNORMAL LOW (ref 22–32)
Calcium: 9.5 mg/dL (ref 8.9–10.3)
Chloride: 99 mmol/L (ref 98–111)
Creatinine, Ser: 7.44 mg/dL — ABNORMAL HIGH (ref 0.61–1.24)
GFR calc Af Amer: 8 mL/min — ABNORMAL LOW (ref >60)
GFR calc non Af Amer: 7 mL/min — ABNORMAL LOW (ref >60)
Glucose, Bld: 119 mg/dL — ABNORMAL HIGH (ref 70–99)
Potassium: 4.8 mmol/L (ref 3.5–5.1)
Sodium: 137 mmol/L (ref 135–145)
Total Bilirubin: 2.1 mg/dL — ABNORMAL HIGH (ref 0.3–1.2)
Total Protein: 7.3 g/dL (ref 6.5–8.1)

## 2019-06-20 LAB — I-STAT CHEM 8, ED
BUN: 43 mg/dL — ABNORMAL HIGH (ref 8–23)
Calcium, Ion: 1.01 mmol/L — ABNORMAL LOW (ref 1.15–1.40)
Chloride: 102 mmol/L (ref 98–111)
Creatinine, Ser: 7.1 mg/dL — ABNORMAL HIGH (ref 0.61–1.24)
Glucose, Bld: 113 mg/dL — ABNORMAL HIGH (ref 70–99)
HCT: 40 % (ref 39.0–52.0)
Hemoglobin: 13.6 g/dL (ref 13.0–17.0)
Potassium: 5 mmol/L (ref 3.5–5.1)
Sodium: 136 mmol/L (ref 135–145)
TCO2: 26 mmol/L (ref 22–32)

## 2019-06-20 LAB — RESPIRATORY PANEL BY RT PCR (FLU A&B, COVID)
Influenza A by PCR: NEGATIVE
Influenza B by PCR: NEGATIVE
SARS Coronavirus 2 by RT PCR: NEGATIVE

## 2019-06-20 LAB — HEMOGLOBIN A1C
Hgb A1c MFr Bld: 6.3 % — ABNORMAL HIGH (ref 4.8–5.6)
Mean Plasma Glucose: 134.11 mg/dL

## 2019-06-20 LAB — TROPONIN I (HIGH SENSITIVITY)
Troponin I (High Sensitivity): 906 ng/L (ref <18)
Troponin I (High Sensitivity): 814 ng/L (ref <18)
Troponin I (High Sensitivity): 23179 ng/L (ref <18)

## 2019-06-20 LAB — GLUCOSE, CAPILLARY
Glucose-Capillary: 109 mg/dL — ABNORMAL HIGH (ref 70–99)
Glucose-Capillary: 127 mg/dL — ABNORMAL HIGH (ref 70–99)
Glucose-Capillary: 148 mg/dL — ABNORMAL HIGH (ref 70–99)
Glucose-Capillary: 151 mg/dL — ABNORMAL HIGH (ref 70–99)
Glucose-Capillary: 81 mg/dL (ref 70–99)

## 2019-06-20 LAB — HIV ANTIBODY (ROUTINE TESTING W REFLEX): HIV Screen 4th Generation wRfx: NONREACTIVE

## 2019-06-20 LAB — MRSA PCR SCREENING: MRSA by PCR: POSITIVE — AB

## 2019-06-20 LAB — MAGNESIUM: Magnesium: 2 mg/dL (ref 1.7–2.4)

## 2019-06-20 LAB — LIPASE, BLOOD: Lipase: 35 U/L (ref 11–51)

## 2019-06-20 SURGERY — CORONARY/GRAFT ACUTE MI REVASCULARIZATION
Anesthesia: LOCAL

## 2019-06-20 MED ORDER — ONDANSETRON HCL 4 MG/2ML IJ SOLN: 4.0000 mg | Freq: Four times a day (QID) | INTRAMUSCULAR | Status: DC | PRN

## 2019-06-20 MED ORDER — HEPARIN (PORCINE) 25000 UT/250ML-% IV SOLN: 1000.0000 [IU]/h | INTRAVENOUS | Status: DC

## 2019-06-20 MED ORDER — ISOSORBIDE MONONITRATE ER 30 MG PO TB24
30.0000 mg | ORAL_TABLET | Freq: Every evening | ORAL | Status: DC
  Administered 2019-06-20 – 2019-06-22 (×3): 30 mg via ORAL
  Filled 2019-06-20 (×3): qty 1

## 2019-06-20 MED ORDER — MORPHINE SULFATE (PF) 4 MG/ML IV SOLN
4.0000 mg | Freq: Once | INTRAVENOUS | Status: AC
  Administered 2019-06-20: 4 mg via INTRAVENOUS
  Filled 2019-06-20: qty 1

## 2019-06-20 MED ORDER — DIAZEPAM 5 MG PO TABS
5.0000 mg | ORAL_TABLET | Freq: Three times a day (TID) | ORAL | Status: DC | PRN
  Administered 2019-06-21: 5 mg via ORAL
  Filled 2019-06-20: qty 1

## 2019-06-20 MED ORDER — TICAGRELOR 90 MG PO TABS
90.0000 mg | ORAL_TABLET | Freq: Two times a day (BID) | ORAL | Status: DC
  Administered 2019-06-20 – 2019-06-22 (×4): 90 mg via ORAL
  Filled 2019-06-20 (×4): qty 1

## 2019-06-20 MED ORDER — FENTANYL CITRATE (PF) 100 MCG/2ML IJ SOLN
INTRAMUSCULAR | Status: AC
  Filled 2019-06-20: qty 2

## 2019-06-20 MED ORDER — BIVALIRUDIN TRIFLUOROACETATE 250 MG IV SOLR
INTRAVENOUS | Status: AC
  Filled 2019-06-20: qty 250

## 2019-06-20 MED ORDER — LIDOCAINE HCL (PF) 1 % IJ SOLN
INTRAMUSCULAR | Status: AC
  Filled 2019-06-20: qty 30

## 2019-06-20 MED ORDER — IOHEXOL 350 MG/ML SOLN
INTRAVENOUS | Status: AC
  Filled 2019-06-20: qty 1

## 2019-06-20 MED ORDER — ASPIRIN 81 MG PO TABS: 81.0000 mg | ORAL_TABLET | Freq: Every day | ORAL | Status: DC

## 2019-06-20 MED ORDER — LAMOTRIGINE 25 MG PO TABS
25.0000 mg | ORAL_TABLET | Freq: Every day | ORAL | Status: DC
  Administered 2019-06-20 – 2019-06-21 (×2): 25 mg via ORAL
  Filled 2019-06-20 (×3): qty 1

## 2019-06-20 MED ORDER — HEPARIN (PORCINE) IN NACL 1000-0.9 UT/500ML-% IV SOLN
INTRAVENOUS | Status: AC
  Filled 2019-06-20: qty 1000

## 2019-06-20 MED ORDER — FAMOTIDINE 20 MG PO TABS
20.0000 mg | ORAL_TABLET | Freq: Every day | ORAL | Status: DC
  Administered 2019-06-21: 20 mg via ORAL
  Filled 2019-06-20: qty 1

## 2019-06-20 MED ORDER — IOHEXOL 350 MG/ML SOLN
INTRAVENOUS | Status: DC | PRN
  Administered 2019-06-20: 13:00:00 215 mL

## 2019-06-20 MED ORDER — FENTANYL CITRATE (PF) 100 MCG/2ML IJ SOLN
INTRAMUSCULAR | Status: DC | PRN
  Administered 2019-06-20: 25 ug via INTRAVENOUS

## 2019-06-20 MED ORDER — ATORVASTATIN CALCIUM 80 MG PO TABS: 80.0000 mg | ORAL_TABLET | Freq: Every day | ORAL | Status: DC

## 2019-06-20 MED ORDER — BIVALIRUDIN BOLUS VIA INFUSION - CUPID
INTRAVENOUS | Status: DC | PRN
  Administered 2019-06-20: 12:00:00 62.925 mg via INTRAVENOUS

## 2019-06-20 MED ORDER — NITROGLYCERIN 0.4 MG SL SUBL: 0.4000 mg | SUBLINGUAL_TABLET | SUBLINGUAL | Status: DC | PRN

## 2019-06-20 MED ORDER — CHLORHEXIDINE GLUCONATE CLOTH 2 % EX PADS
6.0000 | MEDICATED_PAD | Freq: Every day | CUTANEOUS | Status: DC
  Administered 2019-06-20 – 2019-06-22 (×3): 6 via TOPICAL

## 2019-06-20 MED ORDER — ATORVASTATIN CALCIUM 80 MG PO TABS
80.0000 mg | ORAL_TABLET | Freq: Every day | ORAL | Status: DC
  Administered 2019-06-20 – 2019-06-22 (×3): 80 mg via ORAL
  Filled 2019-06-20 (×3): qty 1

## 2019-06-20 MED ORDER — SODIUM CHLORIDE 0.9% FLUSH: 3.0000 mL | INTRAVENOUS | Status: DC | PRN

## 2019-06-20 MED ORDER — SODIUM CHLORIDE 0.9% FLUSH
3.0000 mL | Freq: Two times a day (BID) | INTRAVENOUS | Status: DC
  Administered 2019-06-20 – 2019-06-22 (×4): 3 mL via INTRAVENOUS

## 2019-06-20 MED ORDER — TICAGRELOR 90 MG PO TABS
ORAL_TABLET | ORAL | Status: AC
  Filled 2019-06-20: qty 2

## 2019-06-20 MED ORDER — HEPARIN (PORCINE) IN NACL 1000-0.9 UT/500ML-% IV SOLN
INTRAVENOUS | Status: DC | PRN
  Administered 2019-06-20 (×2): 500 mL

## 2019-06-20 MED ORDER — TICAGRELOR 90 MG PO TABS
ORAL_TABLET | ORAL | Status: DC | PRN
  Administered 2019-06-20: 180 mg via ORAL

## 2019-06-20 MED ORDER — LIDOCAINE HCL (PF) 1 % IJ SOLN
INTRAMUSCULAR | Status: DC | PRN
  Administered 2019-06-20: 15 mL

## 2019-06-20 MED ORDER — MIDAZOLAM HCL 2 MG/2ML IJ SOLN
INTRAMUSCULAR | Status: AC
  Filled 2019-06-20: qty 2

## 2019-06-20 MED ORDER — METOPROLOL SUCCINATE ER 25 MG PO TB24
12.5000 mg | ORAL_TABLET | Freq: Every day | ORAL | Status: DC
  Administered 2019-06-20 – 2019-06-22 (×3): 12.5 mg via ORAL
  Filled 2019-06-20 (×3): qty 1

## 2019-06-20 MED ORDER — SODIUM CHLORIDE 0.9 % IV SOLN
0.2500 mg/kg/h | INTRAVENOUS | Status: AC
  Administered 2019-06-20: 0.25 mg/kg/h via INTRAVENOUS
  Filled 2019-06-20: qty 250

## 2019-06-20 MED ORDER — MIDAZOLAM HCL 2 MG/2ML IJ SOLN
INTRAMUSCULAR | Status: DC | PRN
  Administered 2019-06-20: 2 mg via INTRAVENOUS

## 2019-06-20 MED ORDER — MUPIROCIN 2 % EX OINT
TOPICAL_OINTMENT | Freq: Two times a day (BID) | CUTANEOUS | Status: DC
  Administered 2019-06-20: 1 via NASAL
  Filled 2019-06-20: qty 22

## 2019-06-20 MED ORDER — NITROGLYCERIN 1 MG/10 ML FOR IR/CATH LAB
INTRA_ARTERIAL | Status: DC | PRN
  Administered 2019-06-20 (×2): 100 ug via INTRACORONARY

## 2019-06-20 MED ORDER — SODIUM CHLORIDE 0.9 % IV SOLN
INTRAVENOUS | Status: AC | PRN
  Administered 2019-06-20: 0.25 mg/kg/h via INTRAVENOUS

## 2019-06-20 MED ORDER — ACETAMINOPHEN 325 MG PO TABS: 650.0000 mg | ORAL_TABLET | ORAL | Status: DC | PRN

## 2019-06-20 MED ORDER — NITROGLYCERIN 1 MG/10 ML FOR IR/CATH LAB
INTRA_ARTERIAL | Status: AC
  Filled 2019-06-20: qty 10

## 2019-06-20 MED ORDER — HYDRALAZINE HCL 20 MG/ML IJ SOLN: 10.0000 mg | INTRAMUSCULAR | Status: AC | PRN

## 2019-06-20 MED ORDER — LEVETIRACETAM 250 MG PO TABS
500.0000 mg | ORAL_TABLET | Freq: Two times a day (BID) | ORAL | Status: DC
  Administered 2019-06-20 – 2019-06-22 (×4): 500 mg via ORAL
  Filled 2019-06-20 (×4): qty 2

## 2019-06-20 MED ORDER — ONDANSETRON HCL 4 MG/2ML IJ SOLN
4.0000 mg | Freq: Four times a day (QID) | INTRAMUSCULAR | Status: DC | PRN
  Administered 2019-06-20: 4 mg via INTRAVENOUS
  Filled 2019-06-20: qty 2

## 2019-06-20 MED ORDER — ASPIRIN 81 MG PO CHEW
81.0000 mg | CHEWABLE_TABLET | Freq: Every day | ORAL | Status: DC
  Administered 2019-06-21 – 2019-06-22 (×2): 81 mg via ORAL
  Filled 2019-06-20 (×2): qty 1

## 2019-06-20 MED ORDER — LABETALOL HCL 5 MG/ML IV SOLN: 10.0000 mg | INTRAVENOUS | Status: AC | PRN

## 2019-06-20 MED ORDER — SODIUM CHLORIDE 0.9 % IV SOLN: 250.0000 mL | INTRAVENOUS | Status: DC | PRN

## 2019-06-20 SURGICAL SUPPLY — 20 items
BALLN SAPPHIRE 2.5X12 (BALLOONS) ×2
BALLN SAPPHIRE ~~LOC~~ 3.25X15 (BALLOONS) ×2 IMPLANT
BALLOON SAPPHIRE 2.5X12 (BALLOONS) ×1 IMPLANT
CATH INFINITI 5FR MULTPACK ANG (CATHETERS) ×2 IMPLANT
CATH VISTA GUIDE 6FR JL4.5 (CATHETERS) ×2 IMPLANT
CATH VISTA GUIDE 6FR XBLAD3.5 (CATHETERS) ×2 IMPLANT
CATH VISTA GUIDE 6FR XBLAD4 (CATHETERS) ×2 IMPLANT
GLIDESHEATH SLEND SS 6F .021 (SHEATH) IMPLANT
GUIDEWIRE INQWIRE 1.5J.035X260 (WIRE) IMPLANT
INQWIRE 1.5J .035X260CM (WIRE)
KIT ENCORE 26 ADVANTAGE (KITS) ×2 IMPLANT
KIT HEART LEFT (KITS) ×2 IMPLANT
PACK CARDIAC CATHETERIZATION (CUSTOM PROCEDURE TRAY) ×2 IMPLANT
SHEATH PINNACLE 6F 10CM (SHEATH) ×2 IMPLANT
STENT RESOLUTE ONYX 3.0X30 (Permanent Stent) ×2 IMPLANT
TRANSDUCER W/STOPCOCK (MISCELLANEOUS) ×2 IMPLANT
TUBING CIL FLEX 10 FLL-RA (TUBING) ×2 IMPLANT
WIRE COUGAR XT STRL 190CM (WIRE) ×2 IMPLANT
WIRE EMERALD 3MM-J .035X150CM (WIRE) ×2 IMPLANT
WIRE PT2 MS 185 (WIRE) ×2 IMPLANT

## 2019-06-20 NOTE — ED Notes (Signed)
Date and time results received: 06/20/19   Test: trop Critical Value: 906  Name of Provider Notified: Dr. Sherry Ruffing  Orders Received? Or Actions Taken?: Actions Taken: awaiting new orders

## 2019-06-20 NOTE — Consult Note (Signed)
[facility] Renal Consultation Note    Indication for Consultation:  Management of ESRD/hemodialysis, anemia, hypertension/volume, and secondary hyperparathyroidism.  HPI: Davonn Flanery is a 69 y.o. male with PMH including ESRD on dialysis, a. Fib, CHF, seizure disorder, dementia, HTN, GI bleed 2018, hx stroke, T2DM, who presented to the ED this morning with chest pain. Patient is reportedly a poor historian but reported chest pain for about 1 week, acutely worsened this AM and associated with some diaphoresis. ECG showed ST changes in the anterior leads. Troponin came back >100. Cardiology recommended  Cardiac catheterization today and echocardiogram to assess LV function. Patient also noted to have a. Fib/a flutter treated with beta blockade. Labs today reveal K+ 5.0, BUN 43, Cr 7.1, Hgb 13.6. CXR with no acute process. COVID swab negative. We have been consulted to manage ESRD during admission. Contacted unit nurse at [facility] to confirm dialysis schedule. He reports patient dialyzes MWF at [facility]. He does not believe patient has had recent missed dialysis treatments.   Patient seen and examined in his room after cath. Reports he is feeling well. Chest pain is improved. Denies SOB, cough, dizziness, palpitations, abdominal pain. Reports he vomited once this morning but has not have any further nausea/vomiting. Confirms he dialyzes MWF and denies any recent issues. He has a LUE AVF which he believes is fairly new but states they have been using it at dialysis without issues. No TDC present. LUE is slightly edematous but be believes swelling is improved. Denies pain/numbness of the arm and hand. He does not know his EDW.   Past Medical History:  Diagnosis Date  . Atrial fibrillation ([place])    a. Chronic Eliquis (CHA2DS2VASc = 6).  . Chronic combined systolic (congestive) and diastolic (congestive) heart failure ([place])    a. Previously reported EF of 30%;  b. 08/2016  Echo: EF 40-45%, antsept, inf, infsept HK, Gr3 DD, mod AI/MR, sev dil LA, mild TR, PASP 34mmHg.  Marland Kitchen Dementia ([place])   . Encephalopathy   . End stage renal disease ([place])    a. On HD.  Marland Kitchen Essential hypertension   . GIB (gastrointestinal bleeding)    a. 08/2016 in setting of Eliquis Rx.  Marland Kitchen Hyperlipidemia   . Pleural effusion   . PVD (peripheral vascular disease) ([place])    a. s/p L BKA;  b. Chronic LE wounds.  . Stroke ([place])   . Type II diabetes mellitus ([place])    Past Surgical History:  Procedure Laterality Date  . BELOW KNEE LEG AMPUTATION Left   . FLEXIBLE SIGMOIDOSCOPY N/A 09/07/2016   Procedure: FLEXIBLE SIGMOIDOSCOPY;  Surgeon: Manus Gunning, MD;  Location: [facility];  Service: Gastroenterology;  Laterality: N/A;   Family History  Problem Relation Age of Onset  . Other Mother        Pt unsure of PMH of family members.   Social History:  reports that he has quit smoking. He has never used smokeless tobacco. He reports that he does not drink alcohol or use drugs.  ROS: As per HPI otherwise negative.  Physical Exam: Vitals:   06/20/19 1249 06/20/19 1254 06/20/19 1259 06/20/19 1304  BP: 113/78 112/80 111/77 108/78  Pulse: (!) 105 (!) 119 (!) 103 (!) 101  Resp: 17 (!) 8 19 (!) 8  Temp:      TempSrc:      SpO2: 100% 100% 100% 100%  Weight:      Height:  General: Well developed, alert male, in no acute distress. Head: Normocephalic, atraumatic, mucus membranes are moist. Neck: JVD not elevated. Lungs: Clear bilaterally to auscultation without wheezes, rales, or rhonchi. Breathing is unlabored. Heart: RRR with normal S1, S2. No murmurs, rubs, or gallops appreciated. Abdomen: Soft, non-tender, non-distended with normoactive bowel sounds. No rebound/guarding. No obvious abdominal masses. Lower extremities: No edema bilateral lower extremities Neuro: Alert and oriented to person and place. Moves all extremities spontaneously. Psych:  Responds to questions  appropriately with a normal affect. Dialysis Access: LUE AVF + thrill/bruit, mild nonpitting edema upper arm, 2+ radial pulse, hand warm  No Known Allergies Prior to Admission medications   Medication Sig Start Date End Date Taking? Authorizing Provider  acetaminophen (TYLENOL) 325 MG tablet Take 650 mg by mouth every 4 (four) hours as needed (for pain).    Yes [provider]  aspirin 81 MG tablet Chew 81 mg by mouth daily.   Yes [provider]  atorvastatin (LIPITOR) 20 MG tablet Take 20 mg by mouth daily. 06/16/19  Yes [provider]  B Complex-C-Folic Acid (NEPHRO VITAMINS) 0.8 MG TABS Take 1 tablet by mouth daily.   Yes [provider]  calcium carbonate (TUMS EX) 750 MG chewable tablet Chew 1 tablet by mouth daily. Chew 1 tablet by mouth with largest meal - dinner   Yes [provider]  ethyl chloride spray Apply 1 application topically Every Tuesday,Thursday,and Saturday with dialysis.   Yes [provider]  famotidine (PEPCID) 20 MG tablet Take 20 mg by mouth daily.   Yes [provider]  isosorbide mononitrate (IMDUR) 30 MG 24 hr tablet Take 30 mg by mouth every evening.    Yes [provider]  lamoTRIgine (LAMICTAL) 25 MG tablet Take 25 mg by mouth at bedtime.    Yes [provider]  levETIRAcetam (KEPPRA) 500 MG tablet Take 1 tablet (500 mg total) by mouth 2 (two) times daily. 03/21/18  Yes Danford, Suann Larry, MD  metoprolol succinate (TOPROL-XL) 25 MG 24 hr tablet Take 12.5 mg by mouth daily. 06/10/19  Yes [provider]  nitroGLYCERIN (NITROSTAT) 0.4 MG SL tablet Place 0.4 mg under the tongue every 5 (five) minutes x 3 doses as needed for chest pain.    Yes [provider]  Nutritional Supplements (FEEDING SUPPLEMENT, NEPRO CARB STEADY,) LIQD Take 237 mLs by mouth 2 (two) times daily between meals.    Yes [provider]   Current Facility-Administered Medications   Medication Dose Route Frequency Provider Last Rate Last Admin  . bivalirudin (ANGIOMAX) 250 mg in sodium chloride 0.9 % 50 mL (5 mg/mL) infusion    Continuous PRN Troy Sine, MD 4.2 mL/hr at 06/20/19 1154 0.25 mg/kg/hr at 06/20/19 1154  . bivalirudin (ANGIOMAX) BOLUS via infusion    PRN Troy Sine, MD   62.925 mg at 06/20/19 1151  . fentaNYL (SUBLIMAZE) injection    PRN Troy Sine, MD   25 mcg at 06/20/19 1142  . Heparin (Porcine) in NaCl 1000-0.9 UT/500ML-% SOLN    PRN Troy Sine, MD   500 mL at 06/20/19 1142  . iohexol (OMNIPAQUE) 350 MG/ML injection    PRN Troy Sine, MD   215 mL at 06/20/19 1308  . lidocaine (PF) (XYLOCAINE) 1 % injection    PRN Troy Sine, MD   15 mL at 06/20/19 1139  . midazolam (VERSED) injection    PRN Troy Sine, MD   2  mg at 06/20/19 1142  . nitroGLYCERIN 1 mg/10 mL (100 mcg/mL) - IR/CATH LAB    PRN Troy Sine, MD   100 mcg at 06/20/19 1253  . ticagrelor (BRILINTA) tablet    PRN Troy Sine, MD   180 mg at 06/20/19 1303   Current Outpatient Medications  Medication Sig Dispense Refill  . acetaminophen (TYLENOL) 325 MG tablet Take 650 mg by mouth every 4 (four) hours as needed (for pain).     Marland Kitchen aspirin 81 MG tablet Chew 81 mg by mouth daily.    Marland Kitchen atorvastatin (LIPITOR) 20 MG tablet Take 20 mg by mouth daily.    . B Complex-C-Folic Acid (NEPHRO VITAMINS) 0.8 MG TABS Take 1 tablet by mouth daily.    . calcium carbonate (TUMS EX) 750 MG chewable tablet Chew 1 tablet by mouth daily. Chew 1 tablet by mouth with largest meal - dinner    . ethyl chloride spray Apply 1 application topically Every Tuesday,Thursday,and Saturday with dialysis.    . famotidine (PEPCID) 20 MG tablet Take 20 mg by mouth daily.    . isosorbide mononitrate (IMDUR) 30 MG 24 hr tablet Take 30 mg by mouth every evening.     . lamoTRIgine (LAMICTAL) 25 MG tablet Take 25 mg by mouth at bedtime.     . levETIRAcetam (KEPPRA) 500 MG tablet Take 1 tablet (500 mg  total) by mouth 2 (two) times daily. 60 tablet 3  . metoprolol succinate (TOPROL-XL) 25 MG 24 hr tablet Take 12.5 mg by mouth daily.    . nitroGLYCERIN (NITROSTAT) 0.4 MG SL tablet Place 0.4 mg under the tongue every 5 (five) minutes x 3 doses as needed for chest pain.     . Nutritional Supplements (FEEDING SUPPLEMENT, NEPRO CARB STEADY,) LIQD Take 237 mLs by mouth 2 (two) times daily between meals.      Labs: Basic Metabolic Panel: Recent Labs  Lab 06/20/19 0838 06/20/19 0900  NA 137 136  K 4.8 5.0  CL 99 102  CO2 20*  --   GLUCOSE 119* 113*  BUN 46* 43*  CREATININE 7.44* 7.10*  CALCIUM 9.5  --    Liver Function Tests: Recent Labs  Lab 06/20/19 0838  AST 26  ALT 23  ALKPHOS 107  BILITOT 2.1*  PROT 7.3  ALBUMIN 4.1   Recent Labs  Lab 06/20/19 0838  LIPASE 35   CBC: Recent Labs  Lab 06/20/19 0838 06/20/19 0900  WBC 6.7  --   NEUTROABS 4.7  --   HGB 12.4* 13.6  HCT 40.5 40.0  MCV 97.8  --   PLT 115*  --    Studies/Results: DG Chest Portable 1 View  Result Date: 06/20/2019 CLINICAL DATA:  Chest pain, shortness of breath EXAM: PORTABLE CHEST 1 VIEW COMPARISON:  03/19/2018 FINDINGS: Dialysis catheter is no longer present. There is no new consolidation or edema. No significant pleural effusion. No pneumothorax. Stable cardiomediastinal contours. IMPRESSION: No acute process in the chest Electronically Signed   By: Macy Mis M.D.   On: 06/20/2019 09:14    Dialysis Orders: Center: [facility]  on MWF .  Assessment/Plan: 1.  Anterior STEMI: Patient presented with chest pain and elevated troponin. Cardiology consulted and proceeded with emergent cardiac catheterization. 2.  ESRD:  K+ 5.0 and CXR clear. No indication for urgent HD today. Will plan HD tomorrow per regular TTS schedule. No heparin with HD. Will need to request HD records tomorrow as centers are closed today. Has  a LUE AVF which he reports has been working well with no recent issues.  3.   Hypertension/volume: BP well controlled. Patient noted to have a.fib/flutter which cardiology is treated with metoprolol. CXR clear.  4.  Anemia: Hgb at goal, no ESA indicated at present.  5.  Metabolic bone disease: Calcium controlled, no phosphorus reported yet. Calcium carbonate on outpatient med list but no other binder listed. Will check phos with labs tomorrow and follow.  6.  Nutrition:  Alb at goal.   Anice Paganini, PA-C 06/20/2019, 1:10 PM  [facility] Pager: [phone]

## 2019-06-20 NOTE — Progress Notes (Signed)
ANTICOAGULATION CONSULT NOTE - Initial Consult  Pharmacy Consult for bivalirudin for 3 hrs post procedure Indication: chest pain/ACS  No Known Allergies  Patient Measurements: Height: 6' (182.9 cm) Weight: 83.9 kg (185 lb) IBW/kg (Calculated) : 77.6 Heparin Dosing Weight: 83 kg   Vital Signs: Temp: 98 F (36.7 C) (05/02 0838) Temp Source: Oral (05/02 0838) BP: 108/78 (05/02 1304) Pulse Rate: 101 (05/02 1304)  Labs: Recent Labs    06/20/19 0838 06/20/19 0856 06/20/19 0900 06/20/19 1053  HGB 12.4*  --  13.6  --   HCT 40.5  --  40.0  --   PLT 115*  --   --   --   LABPROT  --  15.8*  --   --   INR  --  1.3*  --   --   CREATININE 7.44*  --  7.10*  --   TROPONINIHS 906*  --   --  814*    Estimated Creatinine Clearance: 10.8 mL/min (A) (by C-G formula based on SCr of 7.1 mg/dL (H)).   Medical History: Past Medical History:  Diagnosis Date  . Atrial fibrillation ([place])    a. Chronic Eliquis (CHA2DS2VASc = 6).  . Chronic combined systolic (congestive) and diastolic (congestive) heart failure ([place])    a. Previously reported EF of 30%;  b. 08/2016 Echo: EF 40-45%, antsept, inf, infsept HK, Gr3 DD, mod AI/MR, sev dil LA, mild TR, PASP 44mmHg.  Marland Kitchen Dementia ([place])   . Encephalopathy   . End stage renal disease ([place])    a. On HD.  Marland Kitchen Essential hypertension   . GIB (gastrointestinal bleeding)    a. 08/2016 in setting of Eliquis Rx.  Marland Kitchen Hyperlipidemia   . Pleural effusion   . PVD (peripheral vascular disease) ([place])    a. s/p L BKA;  b. Chronic LE wounds.  . Stroke ([place])   . Type II diabetes mellitus (H[facility])     Medications:  Medications Prior to Admission  Medication Sig Dispense Refill Last Dose  . acetaminophen (TYLENOL) 325 MG tablet Take 650 mg by mouth every 4 (four) hours as needed (for pain).    Past Month at Unknown time  . aspirin 81 MG tablet Chew 81 mg by mouth daily.   06/19/2019 at Unknown time  . atorvastatin (LIPITOR) 20 MG tablet Take 20 mg by mouth daily.    06/19/2019 at Unknown time  . B Complex-C-Folic Acid (NEPHRO VITAMINS) 0.8 MG TABS Take 1 tablet by mouth daily.   06/19/2019 at Unknown time  . calcium carbonate (TUMS EX) 750 MG chewable tablet Chew 1 tablet by mouth daily. Chew 1 tablet by mouth with largest meal - dinner   06/19/2019 at Unknown time  . ethyl chloride spray Apply 1 application topically Every Tuesday,Thursday,and Saturday with dialysis.   06/18/2019  . famotidine (PEPCID) 20 MG tablet Take 20 mg by mouth daily.   06/19/2019 at Unknown time  . isosorbide mononitrate (IMDUR) 30 MG 24 hr tablet Take 30 mg by mouth every evening.    06/19/2019 at Unknown time  . lamoTRIgine (LAMICTAL) 25 MG tablet Take 25 mg by mouth at bedtime.    06/19/2019 at Unknown time  . levETIRAcetam (KEPPRA) 500 MG tablet Take 1 tablet (500 mg total) by mouth 2 (two) times daily. 60 tablet 3 06/19/2019 at Unknown time  . metoprolol succinate (TOPROL-XL) 25 MG 24 hr tablet Take 12.5 mg by mouth daily.   06/19/2019 at 0850  . nitroGLYCERIN (NITROSTAT) 0.4 MG SL tablet  Place 0.4 mg under the tongue every 5 (five) minutes x 3 doses as needed for chest pain.    unk  . Nutritional Supplements (FEEDING SUPPLEMENT, NEPRO CARB STEADY,) LIQD Take 237 mLs by mouth 2 (two) times daily between meals.    06/19/2019 at Unknown time    Assessment: 44 YOM with chest tightness and elevated troponins s/p urgent cath. Of note, patient has a history of a diverticular bleed and bleeding near his AV fistula site. Care team aware.   Received angiomax during procedure.  H/H wnl. Plt low.   Goal of Therapy:  Heparin level 0.3-0.5 units/ml Monitor platelets by anticoagulation protocol: Yes   Plan:  -Angiomax 0.25 mg/kg/hr x 3 hrs post procedure -Pharmacy will sign off.  Marguerite Olea, [facility] Clinical Pharmacist Phone [phone]  06/20/2019 2:12 PM

## 2019-06-20 NOTE — Progress Notes (Signed)
ANTICOAGULATION CONSULT NOTE - Initial Consult  Pharmacy Consult for heparin Indication: chest pain/ACS  No Known Allergies  Patient Measurements: Height: 6' (182.9 cm) Weight: 83.9 kg (185 lb) IBW/kg (Calculated) : 77.6 Heparin Dosing Weight: 83 kg   Vital Signs: Temp: 98 F (36.7 C) (05/02 0838) Temp Source: Oral (05/02 0838) BP: 133/96 (05/02 1001) Pulse Rate: 110 (05/02 1001)  Labs: Recent Labs    06/20/19 0838 06/20/19 0856 06/20/19 0900 06/20/19 1053  HGB 12.4*  --  13.6  --   HCT 40.5  --  40.0  --   PLT 115*  --   --   --   LABPROT  --  15.8*  --   --   INR  --  1.3*  --   --   CREATININE 7.44*  --  7.10*  --   TROPONINIHS 906*  --   --  814*    Estimated Creatinine Clearance: 10.8 mL/min (A) (by C-G formula based on SCr of 7.1 mg/dL (H)).   Medical History: Past Medical History:  Diagnosis Date  . Atrial fibrillation ([place])    a. Chronic Eliquis (CHA2DS2VASc = 6).  . Chronic combined systolic (congestive) and diastolic (congestive) heart failure ([place])    a. Previously reported EF of 30%;  b. 08/2016 Echo: EF 40-45%, antsept, inf, infsept HK, Gr3 DD, mod AI/MR, sev dil LA, mild TR, PASP 48mmHg.  Marland Kitchen Dementia ([place])   . Encephalopathy   . End stage renal disease ([place])    a. On HD.  Marland Kitchen Essential hypertension   . GIB (gastrointestinal bleeding)    a. 08/2016 in setting of Eliquis Rx.  Marland Kitchen Hyperlipidemia   . Pleural effusion   . PVD (peripheral vascular disease) ([place])    a. s/p L BKA;  b. Chronic LE wounds.  . Stroke ([place])   . Type II diabetes mellitus (HCC)     Medications:  (Not in a hospital admission)   Assessment: 10 YOM with chest tightness and elevated troponins to start IV heparin for ACS. Of note, patient has a history of a diverticular bleed and bleeding near his AV fistula site. Care team aware. Will dose conservatively and avoid boluses   H/H wnl. Plt low.   Goal of Therapy:  Heparin level 0.3-0.5 units/ml Monitor platelets by  anticoagulation protocol: Yes   Plan:  -Start IV heparin at 1000 units/hr -F/u 8 hr HL -Monitor daily HL, CBC and s/s of bleeding  Albertina Parr, PharmD., BCPS, BCCCP Clinical Pharmacist Clinical phone for 06/20/19 until 10pm: [phone] If after 10pm, please refer to [facility] for unit-specific pharmacist

## 2019-06-20 NOTE — H&P (Addendum)
Cardiology Admission History and Physical:   Patient ID: George Burgess MRN: [id number]; DOB: 29-May-1950   Admission date: 06/20/2019  Primary Care Provider: Caprice Renshaw, MD Primary Cardiologist: New  Chief Complaint:  Anterior ST elevation MI  Patient Profile:   George Burgess is a 69 y.o. male with past medical history of diabetes mellitus, hypertension, end-stage renal disease dialysis dependent, prior CVA, mild dementia, atrial fibrillation with acute anterior ST elevation myocardial infarction.  History of Present Illness:   Patient is an extremely difficult historian.  He states he has had intermittent chest tightness for approximately 1 week radiating to his left upper extremity.  No associated symptoms.  At times he states his chest pain has been persistent for 1 week without completely resolving but at other times says it was worse than others.  The pain is not clearly pleuritic or positional.  Patient lives at [facility].  By report he makes his own decisions.  Note he denies dyspnea or syncope.   Past Medical History:  Diagnosis Date  . Atrial fibrillation ([place])    a. Chronic Eliquis (CHA2DS2VASc = 6).  . Chronic combined systolic (congestive) and diastolic (congestive) heart failure ([place])    a. Previously reported EF of 30%;  b. 08/2016 Echo: EF 40-45%, antsept, inf, infsept HK, Gr3 DD, mod AI/MR, sev dil LA, mild TR, PASP 19mmHg.  Marland Kitchen Dementia ([place])   . Encephalopathy   . End stage renal disease ([place])    a. On HD.  Marland Kitchen Essential hypertension   . GIB (gastrointestinal bleeding)    a. 08/2016 in setting of Eliquis Rx.  Marland Kitchen Hyperlipidemia   . Pleural effusion   . PVD (peripheral vascular disease) ([place])    a. s/p L BKA;  b. Chronic LE wounds.  . Stroke ([place])   . Type II diabetes mellitus ([place])     Past Surgical History:  Procedure Laterality Date  . BELOW KNEE LEG AMPUTATION Left   . FLEXIBLE SIGMOIDOSCOPY N/A 09/07/2016   Procedure: FLEXIBLE SIGMOIDOSCOPY;  Surgeon:  Manus Gunning, MD;  Location: [facility];  Service: Gastroenterology;  Laterality: N/A;     Medications Prior to Admission: Prior to Admission medications   Medication Sig Start Date End Date Taking? Authorizing Provider  acetaminophen (TYLENOL) 325 MG tablet Take 650 mg by mouth every 4 (four) hours as needed (for pain).    Yes [provider]  aspirin 81 MG tablet Chew 81 mg by mouth daily.   Yes [provider]  atorvastatin (LIPITOR) 20 MG tablet Take 20 mg by mouth daily. 06/16/19  Yes [provider]  B Complex-C-Folic Acid (NEPHRO VITAMINS) 0.8 MG TABS Take 1 tablet by mouth daily.   Yes [provider]  calcium carbonate (TUMS EX) 750 MG chewable tablet Chew 1 tablet by mouth daily. Chew 1 tablet by mouth with largest meal - dinner   Yes [provider]  ethyl chloride spray Apply 1 application topically Every Tuesday,Thursday,and Saturday with dialysis.   Yes [provider]  famotidine (PEPCID) 20 MG tablet Take 20 mg by mouth daily.   Yes [provider]  isosorbide mononitrate (IMDUR) 30 MG 24 hr tablet Take 30 mg by mouth every evening.    Yes [provider]  lamoTRIgine (LAMICTAL) 25 MG tablet Take 25 mg by mouth at bedtime.    Yes [provider]  levETIRAcetam (KEPPRA) 500 MG tablet Take 1 tablet (500 mg total) by mouth 2 (two) times daily. 03/21/18  Yes Danford,  Suann Larry, MD  metoprolol succinate (TOPROL-XL) 25 MG 24 hr tablet Take 12.5 mg by mouth daily. 06/10/19  Yes [provider]  nitroGLYCERIN (NITROSTAT) 0.4 MG SL tablet Place 0.4 mg under the tongue every 5 (five) minutes x 3 doses as needed for chest pain.    Yes [provider]  Nutritional Supplements (FEEDING SUPPLEMENT, NEPRO CARB STEADY,) LIQD Take 237 mLs by mouth 2 (two) times daily between meals.    Yes [provider]     Allergies:   No Known Allergies  Social History:   Social History    Socioeconomic History  . Marital status: Single    Spouse name: Not on file  . Number of children: Not on file  . Years of education: Not on file  . Highest education level: Not on file  Occupational History  . Occupation: retired/disabled  Tobacco Use  . Smoking status: Former Research scientist (life sciences)  . Smokeless tobacco: Never Used  . Tobacco comment: Pt thinks he used to smoke cigarettes.  Thinks he quit many years ago.  Substance and Sexual Activity  . Alcohol use: No  . Drug use: No  . Sexual activity: Never  Other Topics Concern  . Not on file  Social History Narrative   Lives @ [facility].  Sedentary.   Social Determinants of Health   Financial Resource Strain:   . Difficulty of Paying Living Expenses:   Food Insecurity:   . Worried About Charity fundraiser in the Last Year:   . Arboriculturist in the Last Year:   Transportation Needs:   . Film/video editor (Medical):   Marland Kitchen Lack of Transportation (Non-Medical):   Physical Activity:   . Days of Exercise per Week:   . Minutes of Exercise per Session:   Stress:   . Feeling of Stress :   Social Connections:   . Frequency of Communication with Friends and Family:   . Frequency of Social Gatherings with Friends and Family:   . Attends Religious Services:   . Active Member of Clubs or Organizations:   . Attends Archivist Meetings:   Marland Kitchen Marital Status:   Intimate Partner Violence:   . Fear of Current or Ex-Partner:   . Emotionally Abused:   Marland Kitchen Physically Abused:   . Sexually Abused:     Family History: Patient states he is unaware of his family's history. The patient's family history includes Other in his mother.    ROS:  Please see the history of present illness.  No fevers, chills.  Occasional cough productive of whitish sputum.  All other ROS reviewed and negative.     Physical Exam/Data:   Vitals:   06/20/19 0845 06/20/19 0900 06/20/19 0915 06/20/19 1001  BP: (!) 127/95 127/86 (!) 136/99 (!) 133/96   Pulse:    (!) 110  Resp:    18  Temp:      TempSrc:      SpO2:    100%  Weight:      Height:       No intake or output data in the 24 hours ending 06/20/19 1052 Last 3 Weights 06/20/2019 10/09/2018 07/30/2018  Weight (lbs) 185 lb 180 lb 185 lb  Weight (kg) 83.915 kg 81.647 kg 83.915 kg     Body mass index is 25.09 kg/m.  General:  Well nourished, well developed, in no acute distress HEENT: normal Lymph: no adenopathy Neck: no JVD Endocrine:  No thryomegaly Vascular: No carotid bruits;  FA pulses 2+ bilaterally without bruits  Cardiac:  normal S1, S2; RRR; no murmur  Lungs:  clear to auscultation bilaterally, no wheezing, rhonchi or rales  Abd: soft, nontender, no hepatomegaly  Ext: no edema; status post left BKA Musculoskeletal:  No deformities, BUE and BLE strength normal and equal Skin: warm and dry  Neuro:  CNs 2-12 intact, no focal abnormalities noted Psych:  Normal affect    EKG:  The ECG that was done Jun 20, 2019 was personally reviewed and demonstrates atrial flutter, right bundle branch block, inferior infarct, ST elevation of approximately 1 mm in the anterior leads.  Laboratory Data:  High Sensitivity Troponin:   Recent Labs  Lab 06/20/19 0838  TROPONINIHS 906*      Chemistry Recent Labs  Lab 06/20/19 0838 06/20/19 0900  NA 137 136  K 4.8 5.0  CL 99 102  CO2 20*  --   GLUCOSE 119* 113*  BUN 46* 43*  CREATININE 7.44* 7.10*  CALCIUM 9.5  --   GFRNONAA 7*  --   GFRAA 8*  --   ANIONGAP 18*  --     Recent Labs  Lab 06/20/19 0838  PROT 7.3  ALBUMIN 4.1  AST 26  ALT 23  ALKPHOS 107  BILITOT 2.1*   Hematology Recent Labs  Lab 06/20/19 0838 06/20/19 0900  WBC 6.7  --   RBC 4.14*  --   HGB 12.4* 13.6  HCT 40.5 40.0  MCV 97.8  --   MCH 30.0  --   MCHC 30.6  --   RDW 15.6*  --   PLT 115*  --       Radiology/Studies:  DG Chest Portable 1 View  Result Date: 06/20/2019 CLINICAL DATA:  Chest pain, shortness of breath EXAM: PORTABLE CHEST 1  VIEW COMPARISON:  03/19/2018 FINDINGS: Dialysis catheter is no longer present. There is no new consolidation or edema. No significant pleural effusion. No pneumothorax. Stable cardiomediastinal contours. IMPRESSION: No acute process in the chest Electronically Signed   By: Macy Mis M.D.   On: 06/20/2019 09:14     Assessment and Plan:   1. Anterior ST elevation myocardial infarction-extremely difficult situation.  Patient is a very poor historian but describes approximately 1 week of intermittent chest pain.  His initial electrocardiogram showed what appears to be 1 mm of ST depression in the anterior leads.  Follow-up ECG shows some improvement.  He continues to have chest tightness.  Initial troponin 906.  I recommended cardiac catheterization today and the risks including myocardial infarction, CVA and death.  He states he is willing to proceed.  Note I contacted [employer] and they stated he typically makes his own decisions.  I also tried multiple numbers for family members and was unable to contact anyone.  We will therefore proceed with emergent cardiac catheterization.  I will treat with aspirin, heparin, statin and metoprolol.  We will arrange an echocardiogram to assess LV function. 2. End-stage renal disease-we will need nephrology to evaluate as he will require dialysis during this admission. 3. Atrial fibrillation/flutter-we will treat with beta-blockade for rate control. CHADSvasc 7.  However he apparently is not on anticoagulation at home.  We will need to review with his previous cardiologist before reinitiating.  He does have a history of GI bleed. 4. Hyperlipidemia-given documented vascular disease we will increase Lipitor to 80 mg daily.  Check lipids and liver in 12 weeks. 5. Hypertension-continue metoprolol.  Follow blood pressure and adjust regimen as needed.  Severity of Illness: The appropriate patient status for this patient is INPATIENT. Inpatient status is judged to be  reasonable and necessary in order to provide the required intensity of service to ensure the patient's safety. The patient's presenting symptoms, physical exam findings, and initial radiographic and laboratory data in the context of their chronic comorbidities is felt to place them at high risk for further clinical deterioration. Furthermore, it is not anticipated that the patient will be medically stable for discharge from the hospital within 2 midnights of admission. The following factors support the patient status of inpatient.   " The patient's presenting symptoms include Chest pain. " The worrisome physical exam findings include atrial fibrillation. " The initial radiographic and laboratory data are worrisome because of anterior ST elevation and abnormal troponin. " The chronic co-morbidities include ESRD.   * I certify that at the point of admission it is my clinical judgment that the patient will require inpatient hospital care spanning beyond 2 midnights from the point of admission due to high intensity of service, high risk for further deterioration and high frequency of surveillance required.*  CRITICAL CARE Performed by: Kirk Ruths   Total critical care time: 60 minutes  Critical care time was exclusive of separately billable procedures and treating other patients.  Critical care was necessary to treat or prevent imminent or life-threatening deterioration.  Critical care was time spent personally by me on the following activities: development of treatment plan with patient and/or surrogate as well as nursing, discussions with consultants, evaluation of patient's response to treatment, examination of patient, obtaining history from patient or surrogate, ordering and performing treatments and interventions, ordering and review of laboratory studies, ordering and review of radiographic studies, pulse oximetry and re-evaluation of patient's condition.     For questions or updates,  please contact [place] Please consult [email or web address] for contact info under        Signed, Kirk Ruths, MD  06/20/2019 10:52 AM

## 2019-06-20 NOTE — ED Triage Notes (Signed)
Patient arrives via ems with c/o of chest pain that started 1 week ago. Per ems, the chest pain worsened last night and the night shift nurses gave him 2 nitro with no relief. Patient received 1 more nitro prior to ems arriving with no relief. Patient describes pain as central burning/chest tightness.   Patient had 324mg  of asprin with ems en route.   Patient is also a MWF dialysis patient, left arm restricted.

## 2019-06-20 NOTE — ED Provider Notes (Signed)
[facility] EMERGENCY DEPARTMENT Provider Note   CSN: [id number] Arrival date & time: 06/20/19  0830     History Chief Complaint  Patient presents with  . Chest Pain    George Burgess is a 69 y.o. male.  The history is provided by the patient, the EMS personnel and medical records. No language interpreter was used.  Chest Pain Pain location:  Substernal area Pain quality: burning, crushing and pressure   Pain radiates to:  Does not radiate Pain severity:  Severe Onset quality:  Gradual Duration:  1 week Timing:  Constant Progression:  Waxing and waning Chronicity:  New Relieved by:  Nothing Worsened by:  Nothing Ineffective treatments:  None tried Associated symptoms: diaphoresis, nausea and shortness of breath   Associated symptoms: no abdominal pain, no altered mental status, no back pain, no cough, no fatigue, no fever, no headache, no heartburn, no lower extremity edema, no near-syncope, no numbness, no palpitations, no vomiting and no weakness   Risk factors: male sex        Past Medical History:  Diagnosis Date  . Atrial fibrillation ([place])    a. Chronic Eliquis (CHA2DS2VASc = 6).  . Chronic combined systolic (congestive) and diastolic (congestive) heart failure ([place])    a. Previously reported EF of 30%;  b. 08/2016 Echo: EF 40-45%, antsept, inf, infsept HK, Gr3 DD, mod AI/MR, sev dil LA, mild TR, PASP 40mmHg.  Marland Kitchen Dementia ([place])   . Encephalopathy   . End stage renal disease ([place])    a. On HD.  Marland Kitchen Essential hypertension   . GIB (gastrointestinal bleeding)    a. 08/2016 in setting of Eliquis Rx.  . Pleural effusion   . PVD (peripheral vascular disease) ([place])    a. s/p L BKA;  b. Chronic LE wounds.  . Stroke ([place])   . Type II diabetes mellitus [facility])     Patient Active Problem List   Diagnosis Date Noted  . Dementia ([place])   . Chronic combined systolic (congestive) and diastolic (congestive) heart failure ([place])   . PVD (peripheral vascular  disease) ([place])   . Stroke ([place])   . Seizure ([place]) 09/11/2017  . ESRD on hemodialysis ([place]) 09/11/2017  . Elevated troponin 09/11/2017  . Atrial fibrillation, chronic 09/11/2017  . 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[place]) 09/11/2017  . Essential hypertension 09/11/2017  . Hypoglycemia 09/11/2017  . Syncope 09/10/2017  . Blindness   . Bloody stools   . Encounter for nasogastric (NG) tube placement   . Lower GI bleed   . Malnutrition of moderate degree 09/05/2016  . Acute GI bleeding 09/04/2016    Past Surgical History:  Procedure Laterality Date  . BELOW KNEE LEG AMPUTATION Left   . FLEXIBLE SIGMOIDOSCOPY N/A 09/07/2016   Procedure: FLEXIBLE SIGMOIDOSCOPY;  Surgeon: Manus Gunning, MD;  Location: [facility];  Service: Gastroenterology;  Laterality: N/A;       Family History  Problem Relation Age of Onset  . Other Mother        Pt unsure of PMH of family members.    Social History   Tobacco Use  . Smoking status: Former Research scientist (life sciences)  . Smokeless tobacco: Never Used  . Tobacco comment: Pt thinks he used to smoke cigarettes.  Thinks he quit many years ago.  Substance Use Topics  . Alcohol use: No  . Drug use: No    Home Medications Prior to Admission medications   Medication Sig Start Date End Date Taking? Authorizing  Provider  acetaminophen (TYLENOL) 325 MG tablet Take 650 mg by mouth every 4 (four) hours as needed (for pain).     [provider]  aspirin 81 MG tablet Chew 81 mg by mouth daily.    [provider]  atorvastatin (LIPITOR) 80 MG tablet Take 1 tablet (80 mg total) by mouth daily at 6 PM. 09/13/16   Mikhail, Velta Addison, DO  B Complex-C-Folic Acid (NEPHRO VITAMINS) 0.8 MG TABS Take 1 tablet by mouth daily.    [provider]  calcium carbonate (TUMS EX) 750 MG chewable tablet Chew 1 tablet by mouth See admin instructions. Chew 1 tablet by mouth three times a day with meals and 1 tablet three times a day with snacks    [provider]  ethyl chloride spray Apply 1 application topically Every Tuesday,Thursday,and Saturday with dialysis.    [provider]  famotidine (PEPCID) 20 MG tablet Take 20 mg by mouth daily.    [provider]  isosorbide mononitrate (IMDUR) 30 MG 24 hr tablet Take 30 mg by mouth every evening.     [provider]  lamoTRIgine (LAMICTAL) 25 MG tablet Take 25 mg by mouth at bedtime.     [provider]  levETIRAcetam (KEPPRA) 500 MG tablet Take 1 tablet (500 mg total) by mouth 2 (two) times daily. 03/21/18   Danford, Suann Larry, MD  metoprolol tartrate (LOPRESSOR) 25 MG tablet Take 25 mg by mouth 2 (two) times daily.    [provider]  nitroGLYCERIN (NITROSTAT) 0.4 MG SL tablet Place 0.4 mg under the tongue every 5 (five) minutes x 3 doses as needed for chest pain.     [provider]  Nutritional Supplements (FEEDING SUPPLEMENT, NEPRO CARB STEADY,) LIQD Take 237 mLs by mouth 2 (two) times daily between meals.     [provider]    Allergies    Patient has no known allergies.  Review of Systems   Review of Systems  Constitutional: Positive for diaphoresis. Negative for chills, fatigue and fever.  HENT: Negative for congestion.   Eyes: Negative for visual disturbance.  Respiratory: Positive for shortness of breath. Negative for cough, chest tightness and wheezing.   Cardiovascular: Positive for chest pain. Negative for palpitations, leg swelling and near-syncope.  Gastrointestinal: Positive for nausea. Negative for abdominal pain, constipation, diarrhea, heartburn and vomiting.  Genitourinary: Negative for flank pain.  Musculoskeletal: Negative for back pain, neck pain and neck stiffness.  Skin: Negative for rash and wound.  Neurological: Negative for weakness, light-headedness, numbness and headaches.  Psychiatric/Behavioral: Negative for agitation.  All other systems reviewed and are negative.   Physical  Exam Updated Vital Signs BP 126/79   Pulse 94   Temp 98 F (36.7 C) (Oral)   Resp 20   Ht 6' (1.829 m)   Wt 83.9 kg   SpO2 100%   BMI 25.09 kg/m   Physical Exam Vitals and nursing note reviewed.  Constitutional:      General: He is not in acute distress.    Appearance: He is well-developed. He is not ill-appearing, toxic-appearing or diaphoretic.  HENT:     Head: Normocephalic and atraumatic.  Eyes:     Conjunctiva/sclera: Conjunctivae normal.     Pupils: Pupils are equal, round, and reactive to light.  Cardiovascular:     Rate and Rhythm: Normal rate and regular rhythm.     Heart sounds: No murmur.  Pulmonary:     Effort: Pulmonary effort is normal. No  respiratory distress.     Breath sounds: Normal breath sounds. No decreased breath sounds, wheezing, rhonchi or rales.  Chest:     Chest wall: No tenderness.  Abdominal:     Palpations: Abdomen is soft.     Tenderness: There is no abdominal tenderness.  Musculoskeletal:     Cervical back: Neck supple.     Right lower leg: No tenderness. No edema.     Comments: L BKA  Skin:    General: Skin is warm and dry.  Neurological:     General: No focal deficit present.     Mental Status: He is alert.  Psychiatric:        Mood and Affect: Mood normal.     ED Results / Procedures / Treatments   Labs (all labs ordered are listed, but only abnormal results are displayed) Labs Reviewed  CBC WITH DIFFERENTIAL/PLATELET - Abnormal; Notable for the following components:      Result Value   RBC 4.14 (*)    Hemoglobin 12.4 (*)    RDW 15.6 (*)    Platelets 115 (*)    All other components within normal limits  COMPREHENSIVE METABOLIC PANEL - Abnormal; Notable for the following components:   CO2 20 (*)    Glucose, Bld 119 (*)    BUN 46 (*)    Creatinine, Ser 7.44 (*)    Total Bilirubin 2.1 (*)    GFR calc non Af Amer 7 (*)    GFR calc Af Amer 8 (*)    Anion gap 18 (*)    All other components within normal limits   PROTIME-INR - Abnormal; Notable for the following components:   Prothrombin Time 15.8 (*)    INR 1.3 (*)    All other components within normal limits  I-STAT CHEM 8, ED - Abnormal; Notable for the following components:   BUN 43 (*)    Creatinine, Ser 7.10 (*)    Glucose, Bld 113 (*)    Calcium, Ion 1.01 (*)    All other components within normal limits  TROPONIN I (HIGH SENSITIVITY) - Abnormal; Notable for the following components:   Troponin I (High Sensitivity) 906 (*)    All other components within normal limits  RESPIRATORY PANEL BY RT PCR (FLU A&B, COVID)  LIPASE, BLOOD  MAGNESIUM  TROPONIN I (HIGH SENSITIVITY)    EKG EKG Interpretation  Date/Time:  Sunday Jun 20 2019 08:35:21 EDT Ventricular Rate:  101 PR Interval:    QRS Duration: 165 QT Interval:  417 QTC Calculation: 541 R Axis:   -114 Text Interpretation: Atrial flutter Right bundle branch block Inferior infarct, age indeterminate When compared to prior, EKG changes concerning for ischemia but no STEMI per cardiologist review at this time.  also similar  a flutter. No STEMI at this time. Confirmed by Antony Blackbird [phone]) on 06/20/2019 8:51:33 AM   Radiology DG Chest Portable 1 View  Result Date: 06/20/2019 CLINICAL DATA:  Chest pain, shortness of breath EXAM: PORTABLE CHEST 1 VIEW COMPARISON:  03/19/2018 FINDINGS: Dialysis catheter is no longer present. There is no new consolidation or edema. No significant pleural effusion. No pneumothorax. Stable cardiomediastinal contours. IMPRESSION: No acute process in the chest Electronically Signed   By: Macy Mis M.D.   On: 06/20/2019 09:14    Procedures Procedures (including critical care time)  CRITICAL CARE Performed by: Gwenyth Allegra Harris Kistler Total critical care time: 45 minutes Critical care time was exclusive of separately billable procedures and treating other patients. Critical  care was necessary to treat or prevent imminent or life-threatening  deterioration. Critical care was time spent personally by me on the following activities: development of treatment plan with patient and/or surrogate as well as nursing, discussions with consultants, evaluation of patient's response to treatment, examination of patient, obtaining history from patient or surrogate, ordering and performing treatments and interventions, ordering and review of laboratory studies, ordering and review of radiographic studies, pulse oximetry and re-evaluation of patient's condition.   Medications Ordered in ED Medications  morphine 4 MG/ML injection 4 mg (4 mg Intravenous Given 06/20/19 3570)    ED Course  I have reviewed the triage vital signs and the nursing notes.  Pertinent labs & imaging results that were available during my care of the patient were reviewed by me and considered in my medical decision making (see chart for details).    MDM Rules/Calculators/A&P                      George Burgess is a 70 y.o. male with a past medical history significant for atrial fibrillation not on anticoagulation due to prior GI bleeds, ESRD on dialysis MWF with no missed treatments, seizures, dementia, CHF, peripheral vascular disease, hypertension, prior stroke, and paperwork from his facility showing prior NSTEMI who presents with chest pain and EKG changes.  According to patient, for the last week he has had constant but waxing and waning chest discomfort.  Reports a morning it acutely worsened to an 8 out of 10 in severity.  He reports it is still that.  He reports he had some shortness of breath and nausea with it.  He is also had some mild diaphoresis with it.  He reports no new fevers, chills, congestion, or cough.  He reports the pain is central and pressure-like and burning in his central chest.  It does not radiate.  He has never had this type of pain before this week but he has had other pains in the past.  He reports he has not missed any dialysis treatment.  He denies  any urinary or GI symptoms.  He reports he still makes some urine.  He reports that he was given aspirin and nitro by the facility without significant improvement in pain.  In route, EMS reports his EKG had changed with more ST elevation present.  On arrival, EKG is showing some concern for ischemia with some ST elevation in atrial flutter present.  Will call the cardiology team to have them assess the EKG initially.  On exam, lungs are clear and I could not reproduce his discomfort.  Abdomen was nontender.  Bowel sounds were appreciated.  Pulses are present in upper and lower extremities although patient has a left BKA.  He denies any leg pain or leg swelling recently.  Vital signs show normal blood pressure and is not tachycardic.  He is not tachypneic.  Given the patient's description of a pressure-like chest discomfort with some evolving EKG abnormalities, I am somewhat concerned about a cardiac etiology of symptoms.  Given his history of GERD, this could be causing symptoms as well.  We will get laboratory and imaging work-up.  We will call cardiology now however to have their evaluation on the EKG.  8:48 AM Just spoke with the on-call STEMI cardiologist who reviewed EKG and agrees that this does not appear to be a STEMI at this time.  They request the regular cardiology team to be called after his laboratory and imaging work-up is  been completed.  Anticipate he will be admitted for chest pain once work-up is completed.  9:40 AM Troponin just came back over 900.  Will call cardiology again and discuss if they want [place] to start heparin and further management recommendations.  Spoke with cardiology who recommended starting heparin if there was not a different contraindication.  Chart review does show the patient has had GI bleeding before however patient does not member this.  Will discuss with pharmacy and likely start heparinization prior to cardiology evaluation.  Anticipate admission for  NSTEMI.  10:42 AM Just spoke to patient's brother, Matty Vanroekel who is aware that the patient is going to go to the Cath Lab for further evaluation and intervention by cardiology.  His contact number is now in the chart at [phone].  11:23 AM Patient just rolled to the Cath Lab for intervention and further cardiology management.    Final Clinical Impression(s) / ED Diagnoses Final diagnoses:  NSTEMI (non-ST elevated myocardial infarction) (HCC)  Precordial pain  Shortness of breath  Elevated troponin level    Clinical Impression: 1. NSTEMI (non-ST elevated myocardial infarction) ([place])   2. Precordial pain   3. Shortness of breath   4. Elevated troponin level     Disposition: Admit  This note was prepared with assistance of Dragon voice recognition software. Occasional wrong-word or sound-a-like substitutions may have occurred due to the inherent limitations of voice recognition software.     Sharline Lehane, Gwenyth Allegra, MD 06/20/19 [id number]

## 2019-06-20 NOTE — ED Notes (Signed)
Cardio at bedside

## 2019-06-21 ENCOUNTER — Inpatient Hospital Stay (HOSPITAL_COMMUNITY): Payer: Medicare Other

## 2019-06-21 DIAGNOSIS — Z89512 Acquired absence of left leg below knee: Secondary | ICD-10-CM

## 2019-06-21 DIAGNOSIS — I480 Paroxysmal atrial fibrillation: Secondary | ICD-10-CM

## 2019-06-21 DIAGNOSIS — I340 Nonrheumatic mitral (valve) insufficiency: Secondary | ICD-10-CM

## 2019-06-21 DIAGNOSIS — E875 Hyperkalemia: Secondary | ICD-10-CM

## 2019-06-21 DIAGNOSIS — N186 End stage renal disease: Secondary | ICD-10-CM

## 2019-06-21 DIAGNOSIS — I361 Nonrheumatic tricuspid (valve) insufficiency: Secondary | ICD-10-CM

## 2019-06-21 LAB — POCT ACTIVATED CLOTTING TIME
Activated Clotting Time: 169 seconds
Activated Clotting Time: 175 seconds
Activated Clotting Time: 180 seconds
Activated Clotting Time: 186 seconds
Activated Clotting Time: 191 seconds
Activated Clotting Time: 197 seconds
Activated Clotting Time: 208 seconds
Activated Clotting Time: 213 seconds
Activated Clotting Time: 296 seconds

## 2019-06-21 LAB — CBC
HCT: 38.9 % — ABNORMAL LOW (ref 39.0–52.0)
Hemoglobin: 12.4 g/dL — ABNORMAL LOW (ref 13.0–17.0)
MCH: 30 pg (ref 26.0–34.0)
MCHC: 31.9 g/dL (ref 30.0–36.0)
MCV: 94.2 fL (ref 80.0–100.0)
Platelets: 152 10*3/uL (ref 150–400)
RBC: 4.13 MIL/uL — ABNORMAL LOW (ref 4.22–5.81)
RDW: 15.5 % (ref 11.5–15.5)
WBC: 8.1 10*3/uL (ref 4.0–10.5)
nRBC: 0 % (ref 0.0–0.2)

## 2019-06-21 LAB — HEPATIC FUNCTION PANEL
ALT: 55 U/L — ABNORMAL HIGH (ref 0–44)
AST: 270 U/L — ABNORMAL HIGH (ref 15–41)
Albumin: 3.8 g/dL (ref 3.5–5.0)
Alkaline Phosphatase: 97 U/L (ref 38–126)
Bilirubin, Direct: 0.5 mg/dL — ABNORMAL HIGH (ref 0.0–0.2)
Indirect Bilirubin: 1.9 mg/dL — ABNORMAL HIGH (ref 0.3–0.9)
Total Bilirubin: 2.4 mg/dL — ABNORMAL HIGH (ref 0.3–1.2)
Total Protein: 6.9 g/dL (ref 6.5–8.1)

## 2019-06-21 LAB — BASIC METABOLIC PANEL
Anion gap: 19 — ABNORMAL HIGH (ref 5–15)
BUN: 60 mg/dL — ABNORMAL HIGH (ref 8–23)
CO2: 20 mmol/L — ABNORMAL LOW (ref 22–32)
Calcium: 9.2 mg/dL (ref 8.9–10.3)
Chloride: 96 mmol/L — ABNORMAL LOW (ref 98–111)
Creatinine, Ser: 8.69 mg/dL — ABNORMAL HIGH (ref 0.61–1.24)
GFR calc Af Amer: 6 mL/min — ABNORMAL LOW (ref >60)
GFR calc non Af Amer: 6 mL/min — ABNORMAL LOW (ref >60)
Glucose, Bld: 147 mg/dL — ABNORMAL HIGH (ref 70–99)
Potassium: 7.1 mmol/L (ref 3.5–5.1)
Sodium: 135 mmol/L (ref 135–145)

## 2019-06-21 LAB — ECHOCARDIOGRAM COMPLETE
Height: 72 in
Weight: 2458.57 oz

## 2019-06-21 LAB — RENAL FUNCTION PANEL
Albumin: 4.1 g/dL (ref 3.5–5.0)
Anion gap: 20 — ABNORMAL HIGH (ref 5–15)
BUN: 64 mg/dL — ABNORMAL HIGH (ref 8–23)
CO2: 20 mmol/L — ABNORMAL LOW (ref 22–32)
Calcium: 9.5 mg/dL (ref 8.9–10.3)
Chloride: 96 mmol/L — ABNORMAL LOW (ref 98–111)
Creatinine, Ser: 8.97 mg/dL — ABNORMAL HIGH (ref 0.61–1.24)
GFR calc Af Amer: 6 mL/min — ABNORMAL LOW (ref >60)
GFR calc non Af Amer: 5 mL/min — ABNORMAL LOW (ref >60)
Glucose, Bld: 121 mg/dL — ABNORMAL HIGH (ref 70–99)
Phosphorus: 7.9 mg/dL — ABNORMAL HIGH (ref 2.5–4.6)
Potassium: 6.9 mmol/L (ref 3.5–5.1)
Sodium: 136 mmol/L (ref 135–145)

## 2019-06-21 LAB — GLUCOSE, CAPILLARY
Glucose-Capillary: 115 mg/dL — ABNORMAL HIGH (ref 70–99)
Glucose-Capillary: 105 mg/dL — ABNORMAL HIGH (ref 70–99)
Glucose-Capillary: 116 mg/dL — ABNORMAL HIGH (ref 70–99)

## 2019-06-21 LAB — LIPID PANEL
Cholesterol: 98 mg/dL (ref 0–200)
HDL: 53 mg/dL (ref >40)
LDL Cholesterol: 33 mg/dL (ref 0–99)
Total CHOL/HDL Ratio: 1.8 RATIO
Triglycerides: 60 mg/dL (ref <150)
VLDL: 12 mg/dL (ref 0–40)

## 2019-06-21 LAB — HEPATITIS B SURFACE ANTIGEN: Hepatitis B Surface Ag: NONREACTIVE

## 2019-06-21 MED ORDER — ATROPINE SULFATE 1 MG/10ML IJ SOSY
PREFILLED_SYRINGE | INTRAMUSCULAR | Status: AC
  Filled 2019-06-21: qty 10

## 2019-06-21 MED ORDER — SODIUM ZIRCONIUM CYCLOSILICATE 10 G PO PACK
10.0000 g | PACK | Freq: Once | ORAL | Status: DC
  Filled 2019-06-21: qty 1

## 2019-06-21 MED ORDER — SODIUM ZIRCONIUM CYCLOSILICATE 10 G PO PACK
10.0000 g | PACK | Freq: Every day | ORAL | Status: DC
  Administered 2019-06-21 – 2019-06-22 (×2): 10 g via ORAL
  Filled 2019-06-21 (×2): qty 1

## 2019-06-21 MED ORDER — FAMOTIDINE 20 MG PO TABS: 20.0000 mg | ORAL_TABLET | Freq: Every day | ORAL | Status: DC

## 2019-06-21 NOTE — CV Procedure (Signed)
2D echo attempted, but patient care in room. Will try later.

## 2019-06-21 NOTE — Progress Notes (Signed)
Called by dialysis nurse that pt was refusing dialysis.  I came to bedside and he is very agitated yelling "GO HEAD" and waving his arms.  He will not cooperate with HD.  ICU RN gave some valium but if he does not calm down we will not be able to safely perform HD.  His repeat K was 6.9.  EKG with atrial flutter and RBBB.  No bradycardia or peaked t waves.  If he persists will need to treat conservatively with repeat lokelma insult/d50.

## 2019-06-21 NOTE — Progress Notes (Signed)
Progress Note  Patient Name: George Burgess Date of Encounter: 06/21/2019  Primary Cardiologist: No primary care provider on file.  Dr. Claiborne Billings  Subjective   Still reports some mild chest burning.  Better than when he first came into the hospital.  Inpatient Medications    Scheduled Meds: . aspirin  81 mg Oral Daily  . atorvastatin  80 mg Oral q1800  . Chlorhexidine Gluconate Cloth  6 each Topical Q0600  . famotidine  20 mg Oral Daily  . isosorbide mononitrate  30 mg Oral QPM  . lamoTRIgine  25 mg Oral QHS  . levETIRAcetam  500 mg Oral BID  . metoprolol succinate  12.5 mg Oral Daily  . mupirocin ointment   Nasal BID  . sodium chloride flush  3 mL Intravenous Q12H  . ticagrelor  90 mg Oral BID   Continuous Infusions: . sodium chloride     PRN Meds: sodium chloride, acetaminophen, diazepam, nitroGLYCERIN, ondansetron (ZOFRAN) IV, sodium chloride flush   Vital Signs    Vitals:   06/21/19 0600 06/21/19 0630 06/21/19 0700 06/21/19 0759  BP: 111/84  113/79   Pulse:  98    Resp: 20 (!) 24 10   Temp:    98 F (36.7 C)  TempSrc:      SpO2:  100%    Weight:      Height:        Intake/Output Summary (Last 24 hours) at 06/21/2019 0859 Last data filed at 06/21/2019 0200 Gross per 24 hour  Intake 457.29 ml  Output 40 ml  Net 417.29 ml   Last 3 Weights 06/21/2019 06/20/2019 10/09/2018  Weight (lbs) 153 lb 10.6 oz 185 lb 180 lb  Weight (kg) 69.7 kg 83.915 kg 81.647 kg      Telemetry    Sinus tachycardia- Personally Reviewed  ECG    Normal sinus rhythm, right bundle branch block, anterior T wave inversions- Personally Reviewed  Physical Exam   GEN: No acute distress.   Neck: No JVD Cardiac: RRR, no murmurs, rubs, or gallops.  Respiratory: Clear to auscultation bilaterally. GI: Soft, nontender, non-distended  MS: No edema; No deformity.  No right femoral site hematoma, left BKA, right ankle wrapped Neuro:  Nonfocal  Psych: Normal affect   Labs    High Sensitivity  Troponin:   Recent Labs  Lab 06/20/19 0838 06/20/19 1053 06/20/19 1443  TROPONINIHS 906* 814* 23,179*      Chemistry Recent Labs  Lab 06/20/19 0838 06/20/19 0900 06/21/19 0323  NA 137 136 135  K 4.8 5.0 7.1*  CL 99 102 96*  CO2 20*  --  20*  GLUCOSE 119* 113* 147*  BUN 46* 43* 60*  CREATININE 7.44* 7.10* 8.69*  CALCIUM 9.5  --  9.2  PROT 7.3  --  6.9  ALBUMIN 4.1  --  3.8  AST 26  --  270*  ALT 23  --  55*  ALKPHOS 107  --  97  BILITOT 2.1*  --  2.4*  GFRNONAA 7*  --  6*  GFRAA 8*  --  6*  ANIONGAP 18*  --  19*     Hematology Recent Labs  Lab 06/20/19 0838 06/20/19 0900 06/21/19 0323  WBC 6.7  --  8.1  RBC 4.14*  --  4.13*  HGB 12.4* 13.6 12.4*  HCT 40.5 40.0 38.9*  MCV 97.8  --  94.2  MCH 30.0  --  30.0  MCHC 30.6  --  31.9  RDW 15.6*  --  15.5  PLT 115*  --  152    BNPNo results for input(s): BNP, PROBNP in the last 168 hours.   DDimer No results for input(s): DDIMER in the last 168 hours.   Radiology    CARDIAC CATHETERIZATION  Result Date: 06/20/2019  1st Mrg lesion is 70% stenosed.  Ramus lesion is 80% stenosed.  RPDA lesion is 80% stenosed.  Mid RCA lesion is 30% stenosed.  Ost LAD to Prox LAD lesion is 40% stenosed.  1st Diag lesion is 70% stenosed.  Prox LAD to Mid LAD lesion is 100% stenosed.  Post intervention, there is a 0% residual stenosis.  A stent was successfully placed.  Total flush occlusion of the proximal LAD with TIMI 0 flow at the site immediately beyond the first proximal diagonal vessel in this patient with delayed presentation and chest pain duration for approximately 2 days. Multi-vessel CAD with percent smooth ostial narrowing of the LAD followed by total occlusion with TIMI 0 flow mediated beyond a diffusely diseased first diagonal vessel; 80% superior branch stenosis of the ramus intermediate vessel; diffuse irregularity of the circumflex marginal vessels with diffuse 70% stenosis in the OM1 vessel and small distal  marginal and distal circumflex vessels with diffuse irregularity; dominant RCA with 30% mid stenosis and 80% mid PDA stenosis.  There were faint septal collaterals to LAD septals but the LAD was not visualized from the RCA injection. Difficult but successful PCI to the total LAD occlusion with PTCA and ultimate insertion of a 3.0 x 30 mm Resolute DES stent postdilated to 3.29 mm with the under percent occlusion being reduced to 0%.  There is no change in the smooth 40% ostial stenosis.  The mid distal LAD had 60% mid stenosis followed by 30% distal stenoses with distal irregularity.  The vessel wrapped around the apex. RECOMMENDATION: DAPT for minimum of 1 year.  We will continue bivalirudin for several hours post procedure particularly since Brilinta was not administered initially was administered in the latter portion of the intervention when the patient had awakened.  Medical therapy for concomitant CAD.  Nephrology will need to be contacted.  The patient undergoes hemodialysis on Monday, Wednesday, and Friday.  Aggressive lipid-lowering therapy with target LDL less than 70.  2D echo Doppler study will be scheduled.   DG Chest Portable 1 View  Result Date: 06/20/2019 CLINICAL DATA:  Chest pain, shortness of breath EXAM: PORTABLE CHEST 1 VIEW COMPARISON:  03/19/2018 FINDINGS: Dialysis catheter is no longer present. There is no new consolidation or edema. No significant pleural effusion. No pneumothorax. Stable cardiomediastinal contours. IMPRESSION: No acute process in the chest Electronically Signed   By: Macy Mis M.D.   On: 06/20/2019 09:14    Cardiac Studies   Elevated cardiac enzymes, elevated potassium, cath films personally reviewed  Patient Profile     69 y.o. male anterior MI, end-stage renal disease, hyperkalemia  Assessment & Plan    Coronary artery disease: I expect his ejection fraction is decreased.  He does not appear grossly volume overloaded.  He will have dialysis later  today.  Continue aspirin and Brilinta.  He will need high-dose statin as well.  Continue metoprolol.  There is a mention of paroxysmal atrial fibrillation.  I do not think he would be a good candidate for triple therapy or even adding anticoagulation to a thienopyridine.  He was not on anticoagulation coming in.  We will continue to watch telemetry.  End-stage renal disease/hyperkalemia: Plan for dialysis later today.  If there is going to be any delay, would consider adding a potassium binding agent.  Discussed with pharmacy.    Given his multiple medical issues and overall frailty, I suspect his hospital course will be a little bit longer than the typical MI patient.  We will also have to get case management involved to help with discharge planning back to [facility].  For questions or updates, please contact [place] Please consult [email or web address] for contact info under        Signed, Larae Grooms, MD  06/21/2019, 8:59 AM

## 2019-06-21 NOTE — Progress Notes (Signed)
CRITICAL VALUE ALERT  Critical Value:  Potassium 6.9  Date & Time Notied:  06/21/19 1233  Orders Received/Actions taken: Expected value, patient received dose of lokelma and plan is for dialysis today.

## 2019-06-21 NOTE — Progress Notes (Signed)
Patient ID: George Burgess, male   DOB: Mar 09, 1950, 69 y.o.   MRN: [id number]  [facility] Progress Note    Subjective:   No complaints this morning.  His potassium is elevated at 7.1.  Lokelma ordered but not given yet, will recheck to verify.  For HD this morning.   Objective:   BP 111/81   Pulse 68   Temp 98 F (36.7 C)   Resp (!) 8   Ht 6' (1.829 m)   Wt 69.7 kg   SpO2 98%   BMI 20.84 kg/m   Intake/Output: I/O last 3 completed shifts: In: 457.3 [P.O.:120; I.V.:337.3] Out: 40 [Urine:40]   Intake/Output this shift:  No intake/output data recorded. Weight change:   Physical Exam: Gen: NAD CVS: no rub Resp: cta Abd: +BS, soft, NT/Nd Ext: no edema, s/p LBKA, LUE AVF +T/B  Labs: BMET Recent Labs  Lab 06/20/19 0838 06/20/19 0900 06/21/19 0323  NA 137 136 135  K 4.8 5.0 7.1*  CL 99 102 96*  CO2 20*  --  20*  GLUCOSE 119* 113* 147*  BUN 46* 43* 60*  CREATININE 7.44* 7.10* 8.69*  ALBUMIN 4.1  --  3.8  CALCIUM 9.5  --  9.2   CBC Recent Labs  Lab 06/20/19 0838 06/20/19 0900 06/21/19 0323  WBC 6.7  --  8.1  NEUTROABS 4.7  --   --   HGB 12.4* 13.6 12.4*  HCT 40.5 40.0 38.9*  MCV 97.8  --  94.2  PLT 115*  --  152      Medications:    . aspirin  81 mg Oral Daily  . atorvastatin  80 mg Oral q1800  . Chlorhexidine Gluconate Cloth  6 each Topical Q0600  . famotidine  20 mg Oral Daily  . isosorbide mononitrate  30 mg Oral QPM  . lamoTRIgine  25 mg Oral QHS  . levETIRAcetam  500 mg Oral BID  . metoprolol succinate  12.5 mg Oral Daily  . mupirocin ointment   Nasal BID  . sodium chloride flush  3 mL Intravenous Q12H  . sodium zirconium cyclosilicate  10 g Oral Daily  . ticagrelor  90 mg Oral BID     Assessment/ Plan:   1. Anterior STEMI-  S/p cath and PTCA of total LAD occlusion and DES placement.  Has multi-vessel CAD.  For ECHO per Cardiology.  Will need DAPT for minimum of 1 year. 1. Currently on angiomax 2. ESRD for HD  today 3. Hyperkalemia- will repeat to verify.  Lokelma ordered and awaiting HD 4. 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  Hgb >12 no ESA 5. CKD-MBD: cont with outpatient meds 6. Nutrition: renal diet 7. Hypertension: stable  Donetta Potts, MD [place] Pager [phone] 06/21/2019, 10:31 AM

## 2019-06-21 NOTE — Progress Notes (Addendum)
Right femoral sheath removed per MD order. Manual pressure held for 25 mins. Site is Level 0. No hematoma palpated. Distal pulses intact, 1+. Pt denies numbness and tingling in lower extremities, pain, nausea, and shortness of breath. Blood pressure and heart rate stable throughout. Pt was educated to hold pressure at site when coughing or sneezing and to notify RN of any pain, oozing at site, or dizziness.

## 2019-06-21 NOTE — Plan of Care (Signed)
Pt is alert and oriented to self, place, and situation. Pt is able to move all extremeties, on room air, breathing even and unlabored. Pt's femoral sheath was removed earlier per protocol, some bleeding at first but hemostasis was achieved. Dressing remains clean, dry,  and intact. Pt has denied pain, dizziness, shortness of breath, and nausea throughout the night. Pt has had some minimal UOP, pt has been encouraged to keep condom cath in place. No complaints or concerns voiced at this time. Call bell is within reach, bed alarm is on, and bed is in lowest position. Problem: Clinical Measurements: Goal: Cardiovascular complication will be avoided Outcome: Progressing   Problem: Elimination: Goal: Will not experience complications related to urinary retention Outcome: Progressing   Problem: Pain Managment: Goal: General experience of comfort will improve Outcome: Progressing   Problem: Safety: Goal: Ability to remain free from injury will improve Outcome: Progressing

## 2019-06-21 NOTE — Progress Notes (Signed)
  Echocardiogram 2D Echocardiogram has been performed.  George Burgess 06/21/2019, 1:50 PM

## 2019-06-22 ENCOUNTER — Telehealth: Payer: Self-pay | Admitting: Cardiovascular Disease

## 2019-06-22 DIAGNOSIS — I482 Chronic atrial fibrillation, unspecified: Secondary | ICD-10-CM

## 2019-06-22 DIAGNOSIS — I5021 Acute systolic (congestive) heart failure: Secondary | ICD-10-CM

## 2019-06-22 DIAGNOSIS — Z992 Dependence on renal dialysis: Secondary | ICD-10-CM

## 2019-06-22 DIAGNOSIS — I739 Peripheral vascular disease, unspecified: Secondary | ICD-10-CM

## 2019-06-22 LAB — CBC
HCT: 39.8 % (ref 39.0–52.0)
Hemoglobin: 13.4 g/dL (ref 13.0–17.0)
MCH: 30.6 pg (ref 26.0–34.0)
MCHC: 33.7 g/dL (ref 30.0–36.0)
MCV: 90.9 fL (ref 80.0–100.0)
Platelets: 134 10*3/uL — ABNORMAL LOW (ref 150–400)
RBC: 4.38 MIL/uL (ref 4.22–5.81)
RDW: 15.3 % (ref 11.5–15.5)
WBC: 10 10*3/uL (ref 4.0–10.5)
nRBC: 0 % (ref 0.0–0.2)

## 2019-06-22 LAB — RENAL FUNCTION PANEL
Albumin: 3.5 g/dL (ref 3.5–5.0)
Anion gap: 15 (ref 5–15)
BUN: 44 mg/dL — ABNORMAL HIGH (ref 8–23)
CO2: 23 mmol/L (ref 22–32)
Calcium: 9 mg/dL (ref 8.9–10.3)
Chloride: 97 mmol/L — ABNORMAL LOW (ref 98–111)
Creatinine, Ser: 6.96 mg/dL — ABNORMAL HIGH (ref 0.61–1.24)
GFR calc Af Amer: 8 mL/min — ABNORMAL LOW (ref >60)
GFR calc non Af Amer: 7 mL/min — ABNORMAL LOW (ref >60)
Glucose, Bld: 83 mg/dL (ref 70–99)
Phosphorus: 5.8 mg/dL — ABNORMAL HIGH (ref 2.5–4.6)
Potassium: 4.8 mmol/L (ref 3.5–5.1)
Sodium: 135 mmol/L (ref 135–145)

## 2019-06-22 MED ORDER — ATORVASTATIN CALCIUM 80 MG PO TABS: 80.0000 mg | ORAL_TABLET | Freq: Every day | ORAL | 3 refills | Status: DC

## 2019-06-22 MED ORDER — TICAGRELOR 90 MG PO TABS: 90.0000 mg | ORAL_TABLET | Freq: Two times a day (BID) | ORAL | 3 refills | Status: DC

## 2019-06-22 NOTE — Progress Notes (Signed)
Discussed MI, stent, and taking meds with pt. Pt confused, not really able to comprehend. Gave him the MI book and set up PCI video. He does st he mobilizes with w/c or hops on RW. Encouraged activity at SNF as tolerated. Will refer to [facility] [place] however pt is n/a at this time. [place] CES, ACSM 2:38 PM 06/22/2019

## 2019-06-22 NOTE — Progress Notes (Signed)
Patient ID: George Burgess, male   DOB: 03/27/1950, 69 y.o.   MRN: [id number] S: More calm this morning.  Became agitated yesterday and initially refused HD but after given valium, he agreed O:BP 108/74   Pulse (!) 102   Temp 97.7 F (36.5 C) (Oral)   Resp 14   Ht 6' (1.829 m)   Wt 64.9 kg   SpO2 99%   BMI 19.40 kg/m   Intake/Output Summary (Last 24 hours) at 06/22/2019 1006 Last data filed at 06/22/2019 0800 Gross per 24 hour  Intake 190 ml  Output 550 ml  Net -360 ml   Intake/Output: I/O last 3 completed shifts: In: 173 [P.O.:170; I.V.:3] Out: 590 [Urine:90; Other:500]  Intake/Output this shift:  Total I/O In: 140 [P.O.:140] Out: -  Weight change: -13.9 kg Gen: NAD CVS: no rub Resp: cta Abd: +BS, soft, NT/ND Ext: no edema, LUE AVF +T/B  Recent Labs  Lab 06/20/19 0838 06/20/19 0900 06/21/19 0323 06/21/19 1053 06/22/19 0241  NA 137 136 135 136 135  K 4.8 5.0 7.1* 6.9* 4.8  CL 99 102 96* 96* 97*  CO2 20*  --  20* 20* 23  GLUCOSE 119* 113* 147* 121* 83  BUN 46* 43* 60* 64* 44*  CREATININE 7.44* 7.10* 8.69* 8.97* 6.96*  ALBUMIN 4.1  --  3.8 4.1 3.5  CALCIUM 9.5  --  9.2 9.5 9.0  PHOS  --   --   --  7.9* 5.8*  AST 26  --  270*  --   --   ALT 23  --  55*  --   --    Liver Function Tests: Recent Labs  Lab 06/20/19 0838 06/20/19 0838 06/21/19 0323 06/21/19 1053 06/22/19 0241  AST 26  --  270*  --   --   ALT 23  --  55*  --   --   ALKPHOS 107  --  97  --   --   BILITOT 2.1*  --  2.4*  --   --   PROT 7.3  --  6.9  --   --   ALBUMIN 4.1   < > 3.8 4.1 3.5   < > = values in this interval not displayed.   Recent Labs  Lab 06/20/19 0838  LIPASE 35   No results for input(s): AMMONIA in the last 168 hours. CBC: Recent Labs  Lab 06/20/19 0838 06/20/19 0838 06/20/19 0900 06/21/19 0323 06/22/19 0241  WBC 6.7  --   --  8.1 10.0  NEUTROABS 4.7  --   --   --   --   HGB 12.4*   < > 13.6 12.4* 13.4  HCT 40.5   < > 40.0 38.9* 39.8  MCV 97.8  --   --  94.2  90.9  PLT 115*  --   --  152 134*   < > = values in this interval not displayed.   Cardiac Enzymes: No results for input(s): CKTOTAL, CKMB, CKMBINDEX, TROPONINI in the last 168 hours. CBG: Recent Labs  Lab 06/20/19 1959 06/20/19 2336 06/21/19 0755 06/21/19 1141 06/21/19 1530  GLUCAP 109* 151* 115* 105* 116*    Iron Studies: No results for input(s): IRON, TIBC, TRANSFERRIN, FERRITIN in the last 72 hours. Studies/Results: CARDIAC CATHETERIZATION  Result Date: 06/20/2019  1st Mrg lesion is 70% stenosed.  Ramus lesion is 80% stenosed.  RPDA lesion is 80% stenosed.  Mid RCA lesion is 30% stenosed.  Ost LAD to Prox LAD lesion is 40%  stenosed.  1st Diag lesion is 70% stenosed.  Prox LAD to Mid LAD lesion is 100% stenosed.  Post intervention, there is a 0% residual stenosis.  A stent was successfully placed.  Total flush occlusion of the proximal LAD with TIMI 0 flow at the site immediately beyond the first proximal diagonal vessel in this patient with delayed presentation and chest pain duration for approximately 2 days. Multi-vessel CAD with percent smooth ostial narrowing of the LAD followed by total occlusion with TIMI 0 flow mediated beyond a diffusely diseased first diagonal vessel; 80% superior branch stenosis of the ramus intermediate vessel; diffuse irregularity of the circumflex marginal vessels with diffuse 70% stenosis in the OM1 vessel and small distal marginal and distal circumflex vessels with diffuse irregularity; dominant RCA with 30% mid stenosis and 80% mid PDA stenosis.  There were faint septal collaterals to LAD septals but the LAD was not visualized from the RCA injection. Difficult but successful PCI to the total LAD occlusion with PTCA and ultimate insertion of a 3.0 x 30 mm Resolute DES stent postdilated to 3.29 mm with the under percent occlusion being reduced to 0%.  There is no change in the smooth 40% ostial stenosis.  The mid distal LAD had 60% mid stenosis followed  by 30% distal stenoses with distal irregularity.  The vessel wrapped around the apex. RECOMMENDATION: DAPT for minimum of 1 year.  We will continue bivalirudin for several hours post procedure particularly since Brilinta was not administered initially was administered in the latter portion of the intervention when the patient had awakened.  Medical therapy for concomitant CAD.  Nephrology will need to be contacted.  The patient undergoes hemodialysis on Monday, Wednesday, and Friday.  Aggressive lipid-lowering therapy with target LDL less than 70.  2D echo Doppler study will be scheduled.   ECHOCARDIOGRAM COMPLETE  Result Date: 06/21/2019    ECHOCARDIOGRAM REPORT   Patient Name:   George Burgess Date of Exam: 06/21/2019 Medical Rec #:  [id number]       Height:       72.0 in Accession #:    [id number]      Weight:       153.7 lb Date of Birth:  February 14, 1951       BSA:          1.905 m Patient Age:    69 years        BP:           113/79 mmHg Patient Gender: M               HR:           101 bpm. Exam Location:  Inpatient Procedure: 2D Echo, Cardiac Doppler and Color Doppler Indications:    121-121.4 ST elevation (STEMI) and non-ST elevation (NSTEMI)                 myocardial infarction  History:        Patient has prior history of Echocardiogram examinations, most                 recent 09/05/2016. CHF, Acute MI and Previous Myocardial                 Infarction, Abnormal ECG, Stroke, Arrythmias:Atrial                 Fibrillation, Signs/Symptoms:Syncope, Alzheimer's and Altered                 Mental Status; Risk  Factors:Hypertension.  Sonographer:    Roseanna Rainbow RDCS Referring Phys: [place]  1. Left ventricular ejection fraction, by estimation, is 25 to 30%. The left ventricle has severely decreased function. The left ventricle demonstrates regional wall motion abnormalities (see scoring diagram/findings for description). There is severe left ventricular hypertrophy. Left ventricular diastolic  parameters are indeterminate. There is the interventricular septum is flattened in systole and diastole, consistent with right ventricular pressure and volume overload.  2. Right ventricular systolic function is normal. The right ventricular size is normal. There is moderately elevated pulmonary artery systolic pressure. The estimated right ventricular systolic pressure is 16.1 mmHg.  3. Left atrial size was moderately dilated.  4. The mitral valve is normal in structure. Moderate mitral valve regurgitation. No evidence of mitral stenosis.  5. Tricuspid valve regurgitation is moderate.  6. The aortic valve is tricuspid. Aortic valve regurgitation is trivial. Mild aortic valve sclerosis is present, with no evidence of aortic valve stenosis.  7. There is mild dilatation at the level of the sinuses of Valsalva measuring 40 mm.  8. The inferior vena cava is dilated in size with <50% respiratory variability, suggesting right atrial pressure of 15 mmHg. Comparison(s): Prior images reviewed side by side. The left ventricular function is worsened. FINDINGS  Left Ventricle: Left ventricular ejection fraction, by estimation, is 25 to 30%. The left ventricle has severely decreased function. The left ventricle demonstrates regional wall motion abnormalities. The left ventricular internal cavity size was normal  in size. There is severe left ventricular hypertrophy. The interventricular septum is flattened in systole and diastole, consistent with right ventricular pressure and volume overload. Left ventricular diastolic parameters are indeterminate.  LV Wall Scoring: The inferior septum, apical lateral segment, and apex are akinetic. Right Ventricle: The right ventricular size is normal. No increase in right ventricular wall thickness. Right ventricular systolic function is normal. There is moderately elevated pulmonary artery systolic pressure. The tricuspid regurgitant velocity is 3.03 m/s, and with an assumed right atrial  pressure of 15 mmHg, the estimated right ventricular systolic pressure is 09.6 mmHg. Left Atrium: Left atrial size was moderately dilated. Right Atrium: Right atrial size was normal in size. Pericardium: There is no evidence of pericardial effusion. Mitral Valve: The mitral valve is normal in structure. Normal mobility of the mitral valve leaflets. Mild mitral annular calcification. Moderate mitral valve regurgitation. No evidence of mitral valve stenosis. Tricuspid Valve: The tricuspid valve is normal in structure. Tricuspid valve regurgitation is moderate . No evidence of tricuspid stenosis. Aortic Valve: The aortic valve is tricuspid. Aortic valve regurgitation is trivial. Mild aortic valve sclerosis is present, with no evidence of aortic valve stenosis. Pulmonic Valve: The pulmonic valve was normal in structure. Pulmonic valve regurgitation is mild. No evidence of pulmonic stenosis. Aorta: The aortic root is normal in size and structure. There is mild dilatation at the level of the sinuses of Valsalva measuring 40 mm. Venous: The inferior vena cava is dilated in size with less than 50% respiratory variability, suggesting right atrial pressure of 15 mmHg. IAS/Shunts: No atrial level shunt detected by color flow Doppler.  LEFT VENTRICLE PLAX 2D LVIDd:         4.60 cm     Diastology LVIDs:         3.70 cm     LV e' lateral: 7.43 cm/s LV PW:         1.40 cm     LV e' medial:  3.83 cm/s LV IVS:  1.70 cm LVOT diam:     2.20 cm LV SV:         38 LV SV Index:   20 LVOT Area:     3.80 cm  LV Volumes (MOD) LV vol d, MOD A2C: 97.9 ml LV vol d, MOD A4C: 88.8 ml LV vol s, MOD A2C: 89.2 ml LV vol s, MOD A4C: 64.1 ml LV SV MOD A2C:     8.7 ml LV SV MOD A4C:     88.8 ml LV SV MOD BP:      18.3 ml RIGHT VENTRICLE            IVC RV S prime:     3.68 cm/s  IVC diam: 3.00 cm TAPSE (M-mode): 0.6 cm LEFT ATRIUM             Index       RIGHT ATRIUM           Index LA diam:        5.20 cm 2.73 cm/m  RA Area:     21.80 cm LA  Vol (A2C):   80.8 ml 42.42 ml/m RA Volume:   67.40 ml  35.38 ml/m LA Vol (A4C):   80.1 ml 42.05 ml/m LA Biplane Vol: 82.2 ml 43.15 ml/m  AORTIC VALVE             PULMONIC VALVE LVOT Vmax:   64.00 cm/s  PR End Diast Vel: 2.05 msec LVOT Vmean:  42.800 cm/s LVOT VTI:    0.099 m  AORTA Ao Root diam: 4.00 cm MR Peak grad:    62.4 mmHg   TRICUSPID VALVE MR Mean grad:    44.0 mmHg   TR Peak grad:   36.7 mmHg MR Vmax:         395.00 cm/s TR Vmax:        303.00 cm/s MR Vmean:        309.0 cm/s MR PISA:         0.84 cm    SHUNTS MR PISA Eff ROA: 9 mm       Systemic VTI:  0.10 m MR PISA Radius:  0.37 cm     Systemic Diam: 2.20 cm Candee Furbish MD Electronically signed by Candee Furbish MD Signature Date/Time: 06/21/2019/2:00:38 PM    Final    . aspirin  81 mg Oral Daily  . atorvastatin  80 mg Oral q1800  . Chlorhexidine Gluconate Cloth  6 each Topical Q0600  . famotidine  20 mg Oral QHS  . isosorbide mononitrate  30 mg Oral QPM  . lamoTRIgine  25 mg Oral QHS  . levETIRAcetam  500 mg Oral BID  . metoprolol succinate  12.5 mg Oral Daily  . mupirocin ointment   Nasal BID  . sodium chloride flush  3 mL Intravenous Q12H  . sodium zirconium cyclosilicate  10 g Oral Daily  . ticagrelor  90 mg Oral BID    BMET    Component Value Date/Time   NA 135 06/22/2019 0241   K 4.8 06/22/2019 0241   CL 97 (L) 06/22/2019 0241   CO2 23 06/22/2019 0241   GLUCOSE 83 06/22/2019 0241   BUN 44 (H) 06/22/2019 0241   CREATININE 6.96 (H) 06/22/2019 0241   CALCIUM 9.0 06/22/2019 0241   GFRNONAA 7 (L) 06/22/2019 0241   GFRAA 8 (L) 06/22/2019 0241   CBC    Component Value Date/Time   WBC 10.0 06/22/2019 0241   RBC 4.38 06/22/2019 0241  HGB 13.4 06/22/2019 0241   HCT 39.8 06/22/2019 0241   PLT 134 (L) 06/22/2019 0241   MCV 90.9 06/22/2019 0241   MCH 30.6 06/22/2019 0241   MCHC 33.7 06/22/2019 0241   RDW 15.3 06/22/2019 0241   LYMPHSABS 0.9 06/20/2019 0838   MONOABS 0.8 06/20/2019 0838   EOSABS 0.1 06/20/2019 0838    BASOSABS 0.0 06/20/2019 0838     Assessment/Plan:  1. Anterior STEMI-  S/p cath and PTCA of total LAD occlusion and DES placement.  Has multi-vessel CAD.  For ECHO per Cardiology.  Will need DAPT for minimum of 1 year. 1. Currently on angiomax 2. ESRD - had HD yesterday but low BP and only UF of 500 ml.  Plan for HD tomorrow and UF as tolerated. 3. Hyperkalemia- improved with HD 4. 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  Hgb >12 no ESA 5. CKD-MBD: cont with outpatient meds 6. Nutrition: renal diet 7. Hypertension: low today.  Plan per cardiology. 8. AMS- ? Due to dementia.  Will follow.  Donetta Potts, MD [employer] [phone]

## 2019-06-22 NOTE — TOC Transition Note (Signed)
Transition of Care [facility]) - CM/SW Discharge Note   Patient Details  Name: George Burgess MRN: [id number] Date of Birth: 15-Feb-1951  Transition of Care [facility]) CM/SW Contact:  Trula Ore, [place] Phone Number: 06/22/2019, 1:22 PM   Clinical Narrative:     Patient will DC to: [facility]  Anticipated DC date: 06/22/2019  Family notified: Vivien Rota  Transport by: Corey Harold  ?  Per MD patient ready for DC to [facility] . RN, patient, patient's family, and facility notified of DC. Discharge Summary sent to facility. RN given number for report tele# [phone]. DC packet on chart. Ambulance transport requested for patient.  CSW signing off.   Final next level of care: Skilled Nursing Facility Barriers to Discharge: No Barriers Identified   Patient Goals and CMS Choice Patient states their goals for this hospitalization and ongoing recovery are:: SNF CMS Medicare.gov Compare Post Acute Care list provided to:: Patient Represenative (must comment)(Toni) Choice offered to / list presented to : Sibling  Discharge Placement              Patient chooses bed at: [facility] Patient to be transferred to facility by: [place] Name of family member notified: [facility]    Discharge Plan and Services                                     Social Determinants of Health (SDOH) Interventions     Readmission Risk Interventions No flowsheet data found.

## 2019-06-22 NOTE — Discharge Summary (Addendum)
Discharge Summary    Patient ID: George Burgess MRN: [id number]; DOB: 04/10/1950  Admit date: 06/20/2019 Discharge date: 06/22/2019  Primary Care Provider: Caprice Renshaw, MD  Primary Cardiologist: Shelva Majestic, MD  Primary Electrophysiologist:  None   Discharge Diagnoses    Principal Problem:   STEMI (ST elevation myocardial infarction) [facility]) Active Problems:   ESRD on hemodialysis [facility])   Atrial fibrillation, chronic   Essential hypertension   Chronic combined systolic (congestive) and diastolic (congestive) heart failure ([place])    Diagnostic Studies/Procedures    Echocardiogram 06/21/19: 1. Left ventricular ejection fraction, by estimation, is 25 to 30%. The  left ventricle has severely decreased function. The left ventricle  demonstrates regional wall motion abnormalities (see scoring  diagram/findings for description). There is severe  left ventricular hypertrophy. Left ventricular diastolic parameters are  indeterminate. There is the interventricular septum is flattened in  systole and diastole, consistent with right ventricular pressure and  volume overload.  2. Right ventricular systolic function is normal. The right ventricular  size is normal. There is moderately elevated pulmonary artery systolic  pressure. The estimated right ventricular systolic pressure is 97.6 mmHg.  3. Left atrial size was moderately dilated.  4. The mitral valve is normal in structure. Moderate mitral valve  regurgitation. No evidence of mitral stenosis.  5. Tricuspid valve regurgitation is moderate.  6. The aortic valve is tricuspid. Aortic valve regurgitation is trivial.  Mild aortic valve sclerosis is present, with no evidence of aortic valve  stenosis.  7. There is mild dilatation at the level of the sinuses of Valsalva  measuring 40 mm.  8. The inferior vena cava is dilated in size with <50% respiratory  variability, suggesting right atrial pressure of 15 mmHg.   Left heart  catheterization 06/20/19:  1st Mrg lesion is 70% stenosed.  Ramus lesion is 80% stenosed.  RPDA lesion is 80% stenosed.  Mid RCA lesion is 30% stenosed.  Ost LAD to Prox LAD lesion is 40% stenosed.  1st Diag lesion is 70% stenosed.  Prox LAD to Mid LAD lesion is 100% stenosed.  Post intervention, there is a 0% residual stenosis.  A stent was successfully placed.   Total flush occlusion of the proximal LAD with TIMI 0 flow at the site immediately beyond the first proximal diagonal vessel in this patient with delayed presentation and chest pain duration for approximately 2 days.  Multi-vessel CAD with percent smooth ostial narrowing of the LAD followed by total occlusion with TIMI 0 flow mediated beyond a diffusely diseased first diagonal vessel; 80% superior branch stenosis of the ramus intermediate vessel; diffuse irregularity of the circumflex marginal vessels with diffuse 70% stenosis in the OM1 vessel and small distal marginal and distal circumflex vessels with diffuse irregularity; dominant RCA with 30% mid stenosis and 80% mid PDA stenosis.  There were faint septal collaterals to LAD septals but the LAD was not visualized from the RCA injection.  Difficult but successful PCI to the total LAD occlusion with PTCA and ultimate insertion of a 3.0 x 30 mm Resolute DES stent postdilated to 3.29 mm with the under percent occlusion being reduced to 0%.  There is no change in the smooth 40% ostial stenosis.  The mid distal LAD had 60% mid stenosis followed by 30% distal stenoses with distal irregularity.  The vessel wrapped around the apex.  RECOMMENDATION: DAPT for minimum of 1 year.  We will continue bivalirudin for several hours post procedure particularly since Brilinta was not administered initially was administered  in the latter portion of the intervention when the patient had awakened.  Medical therapy for concomitant CAD.  Nephrology will need to be contacted.  The patient undergoes  hemodialysis on Monday, Wednesday, and Friday.  Aggressive lipid-lowering therapy with target LDL less than 70.  2D echo Doppler study will be scheduled. _____________   History of Present Illness     George Burgess is a 69 y.o. male with past medical history of diabetes mellitus, hypertension, end-stage renal disease dialysis dependent, prior CVA, mild dementia, atrial fibrillation, who presented with an acute anterior ST elevation myocardial infarction.  Patient is an extremely difficult historian.  He stated he has had intermittent chest tightness for approximately 1 week radiating to his left upper extremity.  No associated symptoms.  At times he stated his chest pain has been persistent for 1 week without completely resolving but at other times says it was worse than others.  The pain is not clearly pleuritic or positional.  Patient [facility] at [facility].  By report he makes his own decisions.  Note he denies dyspnea or syncope.  Hospital Course     Consultants: Nephrology   1. STEMI: patient presented with chest pain. Found to have STE in anterior leads on EKG. Hs[place]rop peaked at [address]. He was taken to the cath lab emergently where he was found to have total occlusion of the pLAD managed with PCI/DES, as well as severe dRCA, ramus, and 1st diagonal, moderate ost LAD, and mild mRCA stenosis which were medically managed. He was started on aspirin and brilinta for DAPT - Continue aspirin and brilinta - Continue metoprolol succinate and imdur - Continue atorvastatin  2. Paroxysmal atrial fibrillation/flutter: felt to be a poor candidate for anticoagulation despite CHA2DS2-VASc Score of 7 (CHF, HTN, DM type 2, Vascular, Age 26-74, and stroke). Rates controlled this admission - Continue metoprolol succinate for rate control  3. Chronic combined CHF: EF 25-30% on echo this admission, down from 40-45% in 2018.  Volume status managed by nephrology at HD. Not on ACEi/ARB/ARNI/spironolactone due to  ESRD. - Continue metoprolol succinate - Continue volume management via HD  4. HTN: BP stable this admission - Continue metoprolol succinate   5. HLD: LDL 33 this admission, though in the setting of #1, atorvastatin was increased from 20mg  daily to 80mg  daily - Continue atorvastatin - Would repeat FLP/LFTs in 6-8 weeks for close monitoring  6. ESRD: On HD M/W/F. Nephrology followed throughout admission.  - Continue outpatient HD per Nephrology - next session 06/23/19  7. Seizure disorder: home medications continued throughout admission. - Continue lamictal and keppra   Did the patient have an acute coronary syndrome (MI, NSTEMI, STEMI, etc) this admission?:  Yes                               AHA/ACC Clinical Performance & Quality Measures: 1. Aspirin prescribed? - Yes 2. ADP Receptor Inhibitor (Plavix/Clopidogrel, Brilinta/Ticagrelor or Effient/Prasugrel) prescribed (includes medically managed patients)? - Yes 3. Beta Blocker prescribed? - Yes 4. High Intensity Statin (Lipitor 40-80mg  or Crestor 20-40mg ) prescribed? - Yes 5. EF assessed during THIS hospitalization? - Yes 6. For EF <40%, was ACEI/ARB prescribed? - No - Reason:  CKD limits addition 7. For EF <40%, Aldosterone Antagonist (Spironolactone or Eplerenone) prescribed? - No - Reason:  CKD limits addition 8. Cardiac Rehab Phase II ordered (Included Medically managed Patients)? - Yes   _____________  Discharge Vitals Blood pressure 95/71,  pulse (!) 58, temperature (!) 97.1 F (36.2 C), temperature source Oral, resp. rate 20, height 6' (1.829 m), weight 64.9 kg, SpO2 100 %.  Filed Weights   06/21/19 1600 06/21/19 1907 06/22/19 0500  Weight: 70 kg 69 kg 64.9 kg    Labs & Radiologic Studies    CBC Recent Labs    06/20/19 0838 06/20/19 0900 06/21/19 0323 06/22/19 0241  WBC 6.7   < > 8.1 10.0  NEUTROABS 4.7  --   --   --   HGB 12.4*   < > 12.4* 13.4  HCT 40.5   < > 38.9* 39.8  MCV 97.8   < > 94.2 90.9  PLT 115*   <  > 152 134*   < > = values in this interval not displayed.   Basic Metabolic Panel Recent Labs    06/20/19 0838 06/20/19 0900 06/21/19 1053 06/22/19 0241  NA 137   < > 136 135  K 4.8   < > 6.9* 4.8  CL 99   < > 96* 97*  CO2 20*   < > 20* 23  GLUCOSE 119*   < > 121* 83  BUN 46*   < > 64* 44*  CREATININE 7.44*   < > 8.97* 6.96*  CALCIUM 9.5   < > 9.5 9.0  MG 2.0  --   --   --   PHOS  --   --  7.9* 5.8*   < > = values in this interval not displayed.   Liver Function Tests Recent Labs    06/20/19 0838 06/20/19 0838 06/21/19 0323 06/21/19 0323 06/21/19 1053 06/22/19 0241  AST 26  --  270*  --   --   --   ALT 23  --  55*  --   --   --   ALKPHOS 107  --  97  --   --   --   BILITOT 2.1*  --  2.4*  --   --   --   PROT 7.3  --  6.9  --   --   --   ALBUMIN 4.1   < > 3.8   < > 4.1 3.5   < > = values in this interval not displayed.   Recent Labs    06/20/19 0838  LIPASE 35   High Sensitivity Troponin:   Recent Labs  Lab 06/20/19 0838 06/20/19 1053 06/20/19 1443  TROPONINIHS 906* 814* 23,179*    BNP Invalid input(s): POCBNP D-Dimer No results for input(s): DDIMER in the last 72 hours. Hemoglobin A1C Recent Labs    06/20/19 1431  HGBA1C 6.3*   Fasting Lipid Panel Recent Labs    06/21/19 0323  CHOL 98  HDL 53  LDLCALC 33  TRIG 60  CHOLHDL 1.8   Thyroid Function Tests No results for input(s): TSH, T4TOTAL, T3FREE, THYROIDAB in the last 72 hours.  Invalid input(s): FREET3 _____________  CARDIAC CATHETERIZATION  Result Date: 06/20/2019  1st Mrg lesion is 70% stenosed.  Ramus lesion is 80% stenosed.  RPDA lesion is 80% stenosed.  Mid RCA lesion is 30% stenosed.  Ost LAD to Prox LAD lesion is 40% stenosed.  1st Diag lesion is 70% stenosed.  Prox LAD to Mid LAD lesion is 100% stenosed.  Post intervention, there is a 0% residual stenosis.  A stent was successfully placed.  Total flush occlusion of the proximal LAD with TIMI 0 flow at the site immediately  beyond the first proximal diagonal vessel in  this patient with delayed presentation and chest pain duration for approximately 2 days. Multi-vessel CAD with percent smooth ostial narrowing of the LAD followed by total occlusion with TIMI 0 flow mediated beyond a diffusely diseased first diagonal vessel; 80% superior branch stenosis of the ramus intermediate vessel; diffuse irregularity of the circumflex marginal vessels with diffuse 70% stenosis in the OM1 vessel and small distal marginal and distal circumflex vessels with diffuse irregularity; dominant RCA with 30% mid stenosis and 80% mid PDA stenosis.  There were faint septal collaterals to LAD septals but the LAD was not visualized from the RCA injection. Difficult but successful PCI to the total LAD occlusion with PTCA and ultimate insertion of a 3.0 x 30 mm Resolute DES stent postdilated to 3.29 mm with the under percent occlusion being reduced to 0%.  There is no change in the smooth 40% ostial stenosis.  The mid distal LAD had 60% mid stenosis followed by 30% distal stenoses with distal irregularity.  The vessel wrapped around the apex. RECOMMENDATION: DAPT for minimum of 1 year.  We will continue bivalirudin for several hours post procedure particularly since Brilinta was not administered initially was administered in the latter portion of the intervention when the patient had awakened.  Medical therapy for concomitant CAD.  Nephrology will need to be contacted.  The patient undergoes hemodialysis on Monday, Wednesday, and Friday.  Aggressive lipid-lowering therapy with target LDL less than 70.  2D echo Doppler study will be scheduled.   DG Chest Portable 1 View  Result Date: 06/20/2019 CLINICAL DATA:  Chest pain, shortness of breath EXAM: PORTABLE CHEST 1 VIEW COMPARISON:  03/19/2018 FINDINGS: Dialysis catheter is no longer present. There is no new consolidation or edema. No significant pleural effusion. No pneumothorax. Stable cardiomediastinal  contours. IMPRESSION: No acute process in the chest Electronically Signed   By: Macy Mis M.D.   On: 06/20/2019 09:14   ECHOCARDIOGRAM COMPLETE  Result Date: 06/21/2019    ECHOCARDIOGRAM REPORT   Patient Name:   STRAN RAPER Date of Exam: 06/21/2019 Medical Rec #:  [id number]       Height:       72.0 in Accession #:    [id number]      Weight:       153.7 lb Date of Birth:  09/28/1950       BSA:          1.905 m Patient Age:    32 years        BP:           113/79 mmHg Patient Gender: M               HR:           101 bpm. Exam Location:  Inpatient Procedure: 2D Echo, Cardiac Doppler and Color Doppler Indications:    121-121.4 ST elevation (STEMI) and non-ST elevation (NSTEMI)                 myocardial infarction  History:        Patient has prior history of Echocardiogram examinations, most                 recent 09/05/2016. CHF, Acute MI and Previous Myocardial                 Infarction, Abnormal ECG, Stroke, Arrythmias:Atrial                 Fibrillation, Signs/Symptoms:Syncope, Alzheimer's and Altered  Mental Status; Risk Factors:Hypertension.  Sonographer:    Roseanna Rainbow RDCS Referring Phys: [place]  1. Left ventricular ejection fraction, by estimation, is 25 to 30%. The left ventricle has severely decreased function. The left ventricle demonstrates regional wall motion abnormalities (see scoring diagram/findings for description). There is severe left ventricular hypertrophy. Left ventricular diastolic parameters are indeterminate. There is the interventricular septum is flattened in systole and diastole, consistent with right ventricular pressure and volume overload.  2. Right ventricular systolic function is normal. The right ventricular size is normal. There is moderately elevated pulmonary artery systolic pressure. The estimated right ventricular systolic pressure is 62.3 mmHg.  3. Left atrial size was moderately dilated.  4. The mitral valve is normal in  structure. Moderate mitral valve regurgitation. No evidence of mitral stenosis.  5. Tricuspid valve regurgitation is moderate.  6. The aortic valve is tricuspid. Aortic valve regurgitation is trivial. Mild aortic valve sclerosis is present, with no evidence of aortic valve stenosis.  7. There is mild dilatation at the level of the sinuses of Valsalva measuring 40 mm.  8. The inferior vena cava is dilated in size with <50% respiratory variability, suggesting right atrial pressure of 15 mmHg. Comparison(s): Prior images reviewed side by side. The left ventricular function is worsened. FINDINGS  Left Ventricle: Left ventricular ejection fraction, by estimation, is 25 to 30%. The left ventricle has severely decreased function. The left ventricle demonstrates regional wall motion abnormalities. The left ventricular internal cavity size was normal  in size. There is severe left ventricular hypertrophy. The interventricular septum is flattened in systole and diastole, consistent with right ventricular pressure and volume overload. Left ventricular diastolic parameters are indeterminate.  LV Wall Scoring: The inferior septum, apical lateral segment, and apex are akinetic. Right Ventricle: The right ventricular size is normal. No increase in right ventricular wall thickness. Right ventricular systolic function is normal. There is moderately elevated pulmonary artery systolic pressure. The tricuspid regurgitant velocity is 3.03 m/s, and with an assumed right atrial pressure of 15 mmHg, the estimated right ventricular systolic pressure is 76.2 mmHg. Left Atrium: Left atrial size was moderately dilated. Right Atrium: Right atrial size was normal in size. Pericardium: There is no evidence of pericardial effusion. Mitral Valve: The mitral valve is normal in structure. Normal mobility of the mitral valve leaflets. Mild mitral annular calcification. Moderate mitral valve regurgitation. No evidence of mitral valve stenosis.  Tricuspid Valve: The tricuspid valve is normal in structure. Tricuspid valve regurgitation is moderate . No evidence of tricuspid stenosis. Aortic Valve: The aortic valve is tricuspid. Aortic valve regurgitation is trivial. Mild aortic valve sclerosis is present, with no evidence of aortic valve stenosis. Pulmonic Valve: The pulmonic valve was normal in structure. Pulmonic valve regurgitation is mild. No evidence of pulmonic stenosis. Aorta: The aortic root is normal in size and structure. There is mild dilatation at the level of the sinuses of Valsalva measuring 40 mm. Venous: The inferior vena cava is dilated in size with less than 50% respiratory variability, suggesting right atrial pressure of 15 mmHg. IAS/Shunts: No atrial level shunt detected by color flow Doppler.  LEFT VENTRICLE PLAX 2D LVIDd:         4.60 cm     Diastology LVIDs:         3.70 cm     LV e' lateral: 7.43 cm/s LV PW:         1.40 cm     LV e' medial:  3.83  cm/s LV IVS:        1.70 cm LVOT diam:     2.20 cm LV SV:         38 LV SV Index:   20 LVOT Area:     3.80 cm  LV Volumes (MOD) LV vol d, MOD A2C: 97.9 ml LV vol d, MOD A4C: 88.8 ml LV vol s, MOD A2C: 89.2 ml LV vol s, MOD A4C: 64.1 ml LV SV MOD A2C:     8.7 ml LV SV MOD A4C:     88.8 ml LV SV MOD BP:      18.3 ml RIGHT VENTRICLE            IVC RV S prime:     3.68 cm/s  IVC diam: 3.00 cm TAPSE (M-mode): 0.6 cm LEFT ATRIUM             Index       RIGHT ATRIUM           Index LA diam:        5.20 cm 2.73 cm/m  RA Area:     21.80 cm LA Vol (A2C):   80.8 ml 42.42 ml/m RA Volume:   67.40 ml  35.38 ml/m LA Vol (A4C):   80.1 ml 42.05 ml/m LA Biplane Vol: 82.2 ml 43.15 ml/m  AORTIC VALVE             PULMONIC VALVE LVOT Vmax:   64.00 cm/s  PR End Diast Vel: 2.05 msec LVOT Vmean:  42.800 cm/s LVOT VTI:    0.099 m  AORTA Ao Root diam: 4.00 cm MR Peak grad:    62.4 mmHg   TRICUSPID VALVE MR Mean grad:    44.0 mmHg   TR Peak grad:   36.7 mmHg MR Vmax:         395.00 cm/s TR Vmax:        303.00  cm/s MR Vmean:        309.0 cm/s MR PISA:         0.84 cm    SHUNTS MR PISA Eff ROA: 9 mm       Systemic VTI:  0.10 m MR PISA Radius:  0.37 cm     Systemic Diam: 2.20 cm Candee Furbish MD Electronically signed by Candee Furbish MD Signature Date/Time: 06/21/2019/2:00:38 PM    Final    Disposition   Pt is being discharged home today in good condition.  Follow-up Plans & Appointments    Follow-up Information    Abigail Butts., PA-C Follow up on 06/30/2019.   Specialty: Physician Assistant Why: Please arrive 15 minutes early for your 2:15pm post-hospital cardiology follow-up appointment.  Contact information: [address] [place] [address] [phone]          Discharge Instructions    Diet - low sodium heart healthy   Complete by: As directed    Increase activity slowly   Complete by: As directed       Discharge Medications   Allergies as of 06/22/2019   No Known Allergies     Medication List    TAKE these medications   acetaminophen 325 MG tablet Commonly known as: TYLENOL Take 650 mg by mouth every 4 (four) hours as needed (for pain).   aspirin 81 MG tablet Chew 81 mg by mouth daily.   atorvastatin 80 MG tablet Commonly known as: LIPITOR Take 1 tablet (80 mg total) by mouth daily at 6 PM. What  changed:   medication strength  how much to take  when to take this   calcium carbonate 750 MG chewable tablet Commonly known as: TUMS EX Chew 1 tablet by mouth daily. Chew 1 tablet by mouth with largest meal - dinner   ethyl chloride spray Apply 1 application topically Every Tuesday,Thursday,and Saturday with dialysis.   famotidine 20 MG tablet Commonly known as: PEPCID Take 20 mg by mouth daily.   feeding supplement (NEPRO CARB STEADY) Liqd Take 237 mLs by mouth 2 (two) times daily between meals.   isosorbide mononitrate 30 MG 24 hr tablet Commonly known as: IMDUR Take 30 mg by mouth every evening.   lamoTRIgine 25 MG tablet Commonly  known as: LAMICTAL Take 25 mg by mouth at bedtime.   levETIRAcetam 500 MG tablet Commonly known as: KEPPRA Take 1 tablet (500 mg total) by mouth 2 (two) times daily.   metoprolol succinate 25 MG 24 hr tablet Commonly known as: TOPROL-XL Take 12.5 mg by mouth daily.   Nephro Vitamins 0.8 MG Tabs Take 1 tablet by mouth daily.   nitroGLYCERIN 0.4 MG SL tablet Commonly known as: NITROSTAT Place 0.4 mg under the tongue every 5 (five) minutes x 3 doses as needed for chest pain.   ticagrelor 90 MG Tabs tablet Commonly known as: BRILINTA Take 1 tablet (90 mg total) by mouth 2 (two) times daily.          Outstanding Labs/Studies   Repeat FLP/LFTs in 6-8 weeks  Duration of Discharge Encounter   Greater than 30 minutes including physician time.  Signed, Abigail Butts, PA-C 06/22/2019, 2:15 PM  I have examined the patient and reviewed assessment and plan and discussed with patient.  Agree with above as stated.  Patient with recent anterior MI.  Initial ECG was questionable and not definitive for STEMI due to RBBB, but due to ongoing sx, he was taken to the cath.  Stent to LAD was placed.  He has many medical issues including ESRD, likely some degree of dementia, PAD.    BP is borderline.  COntinue beta blocker and nitrates, both for residual CAD and low EF.  Volume management per renal with dilaysis.  Acute worsening of chronic systolic/diastolic heart failure.  Larae Grooms

## 2019-06-22 NOTE — Telephone Encounter (Signed)
Patient has TOC appointment with Roby Lofts on 06/30/2019 at 2:15 pm per Daleen Snook.

## 2019-06-22 NOTE — Telephone Encounter (Signed)
Pt currently admitted. TOC call for 06/23/19.

## 2019-06-22 NOTE — Progress Notes (Signed)
Attempted call for report to [facility] x3.  First attempt no answer, Second message left, Third with PTAR at the bedside, no answer.  Kathleene Hazel, RN

## 2019-06-22 NOTE — Discharge Instructions (Signed)
PLEASE REMEMBER TO BRING ALL OF YOUR MEDICATIONS TO EACH OF YOUR FOLLOW-UP OFFICE VISITS.  PLEASE ATTEND ALL SCHEDULED FOLLOW-UP APPOINTMENTS.   Activity: Increase activity slowly as tolerated. You may shower, but no soaking baths (or swimming) for 1 week. No driving for 24 hours. No lifting over 5 lbs for 1 week. No sexual activity for 1 week.   You May Return to Work: in 1 week (if applicable)  Wound Care: You may wash cath site gently with soap and water. Keep cath site clean and dry. If you notice pain, swelling, bleeding or pus at your cath site, please call [phone].  ______________________________________________________________________  Heart Failure Education: 1. Weigh yourself EVERY morning after you go to the bathroom but before you eat or drink anything. Write this number down in a weight log/diary. If you gain 3 pounds overnight or 5 pounds in a week, call the office. 2. Take your medicines as prescribed. If you have concerns about your medications, please call us before you stop taking them.  3. Eat low salt foods--Limit salt (sodium) to 2000 mg per day. This will help prevent your body from holding onto fluid. Read food labels as many processed foods have a lot of sodium, especially canned goods and prepackaged meats. If you would like some assistance choosing low sodium foods, we would be happy to set you up with a nutritionist. 4. Stay as active as you can everyday. Staying active will give you more energy and make your muscles stronger. Start with 5 minutes at a time and work your way up to 30 minutes a day. Break up your activities--do some in the morning and some in the afternoon. Start with 3 days per week and work your way up to 5 days as you can.  If you have chest pain, feel short of breath, dizzy, or lightheaded, STOP. If you don't feel better after a short rest, call 911. If you do feel better, call the office to let us know you have symptoms with exercise. 5. Limit all  fluids for the day to less than 2 liters. Fluid includes all drinks, coffee, juice, ice chips, soup, jello, and all other liquids.   ________________________________________________________________________  Please ensure you take your aspirin and brilinta without missing doses. These medications work to keep your stent open. Missing doses puts you at risk for having a blockage in your stent which can cause another heart attack.

## 2019-06-22 NOTE — Progress Notes (Addendum)
Progress Note  Patient Name: Gale Hulse Date of Encounter: 06/22/2019  Primary Cardiologist: No primary care provider on file. Dr. Claiborne Billings  Subjective   No chest pain  Inpatient Medications    Scheduled Meds: . aspirin  81 mg Oral Daily  . atorvastatin  80 mg Oral q1800  . Chlorhexidine Gluconate Cloth  6 each Topical Q0600  . famotidine  20 mg Oral QHS  . isosorbide mononitrate  30 mg Oral QPM  . lamoTRIgine  25 mg Oral QHS  . levETIRAcetam  500 mg Oral BID  . metoprolol succinate  12.5 mg Oral Daily  . mupirocin ointment   Nasal BID  . sodium chloride flush  3 mL Intravenous Q12H  . sodium zirconium cyclosilicate  10 g Oral Daily  . ticagrelor  90 mg Oral BID   Continuous Infusions: . sodium chloride     PRN Meds: sodium chloride, acetaminophen, diazepam, nitroGLYCERIN, ondansetron (ZOFRAN) IV, sodium chloride flush   Vital Signs    Vitals:   06/22/19 0800 06/22/19 0816 06/22/19 0900 06/22/19 1000  BP: 108/74  (!) 98/58 102/61  Pulse: (!) 116 (!) 102 (!) 54 (!) 25  Resp: 14  14 (!) 22  Temp:      TempSrc:      SpO2: 99%  100% 100%  Weight:      Height:        Intake/Output Summary (Last 24 hours) at 06/22/2019 1119 Last data filed at 06/22/2019 0800 Gross per 24 hour  Intake 190 ml  Output 550 ml  Net -360 ml   Last 3 Weights 06/22/2019 06/21/2019 06/21/2019  Weight (lbs) 143 lb 1.3 oz 152 lb 1.9 oz 154 lb 5.2 oz  Weight (kg) 64.9 kg 69 kg 70 kg      Telemetry    Atrial flutter- Personally Reviewed  ECG      Physical Exam   GEN: No acute distress. Mildly confused.   Neck: No JVD Cardiac: RRR, no murmurs, rubs, or gallops.  Respiratory: Clear to auscultation bilaterally. GI: Soft, nontender, non-distended  MS: Left BKA; right leg dressing from [facility] place removed, no ulcer- was likely there as a precaution.  Neuro:  Nonfocal  Psych: Normal affect   Labs    High Sensitivity Troponin:   Recent Labs  Lab 06/20/19 0838 06/20/19 1053  06/20/19 1443  TROPONINIHS 906* 814* 23,179*      Chemistry Recent Labs  Lab 06/20/19 0838 06/20/19 0900 06/21/19 0323 06/21/19 1053 06/22/19 0241  NA 137   < > 135 136 135  K 4.8   < > 7.1* 6.9* 4.8  CL 99   < > 96* 96* 97*  CO2 20*   < > 20* 20* 23  GLUCOSE 119*   < > 147* 121* 83  BUN 46*   < > 60* 64* 44*  CREATININE 7.44*   < > 8.69* 8.97* 6.96*  CALCIUM 9.5   < > 9.2 9.5 9.0  PROT 7.3  --  6.9  --   --   ALBUMIN 4.1   < > 3.8 4.1 3.5  AST 26  --  270*  --   --   ALT 23  --  55*  --   --   ALKPHOS 107  --  97  --   --   BILITOT 2.1*  --  2.4*  --   --   GFRNONAA 7*   < > 6* 5* 7*  GFRAA 8*   < >  6* 6* 8*  ANIONGAP 18*   < > 19* 20* 15   < > = values in this interval not displayed.     Hematology Recent Labs  Lab 06/20/19 8938 06/20/19 0838 06/20/19 0900 06/21/19 0323 06/22/19 0241  WBC 6.7  --   --  8.1 10.0  RBC 4.14*  --   --  4.13* 4.38  HGB 12.4*   < > 13.6 12.4* 13.4  HCT 40.5   < > 40.0 38.9* 39.8  MCV 97.8  --   --  94.2 90.9  MCH 30.0  --   --  30.0 30.6  MCHC 30.6  --   --  31.9 33.7  RDW 15.6*  --   --  15.5 15.3  PLT 115*  --   --  152 134*   < > = values in this interval not displayed.    BNPNo results for input(s): BNP, PROBNP in the last 168 hours.   DDimer No results for input(s): DDIMER in the last 168 hours.   Radiology    CARDIAC CATHETERIZATION  Result Date: 06/20/2019  1st Mrg lesion is 70% stenosed.  Ramus lesion is 80% stenosed.  RPDA lesion is 80% stenosed.  Mid RCA lesion is 30% stenosed.  Ost LAD to Prox LAD lesion is 40% stenosed.  1st Diag lesion is 70% stenosed.  Prox LAD to Mid LAD lesion is 100% stenosed.  Post intervention, there is a 0% residual stenosis.  A stent was successfully placed.  Total flush occlusion of the proximal LAD with TIMI 0 flow at the site immediately beyond the first proximal diagonal vessel in this patient with delayed presentation and chest pain duration for approximately 2 days.  Multi-vessel CAD with percent smooth ostial narrowing of the LAD followed by total occlusion with TIMI 0 flow mediated beyond a diffusely diseased first diagonal vessel; 80% superior branch stenosis of the ramus intermediate vessel; diffuse irregularity of the circumflex marginal vessels with diffuse 70% stenosis in the OM1 vessel and small distal marginal and distal circumflex vessels with diffuse irregularity; dominant RCA with 30% mid stenosis and 80% mid PDA stenosis.  There were faint septal collaterals to LAD septals but the LAD was not visualized from the RCA injection. Difficult but successful PCI to the total LAD occlusion with PTCA and ultimate insertion of a 3.0 x 30 mm Resolute DES stent postdilated to 3.29 mm with the under percent occlusion being reduced to 0%.  There is no change in the smooth 40% ostial stenosis.  The mid distal LAD had 60% mid stenosis followed by 30% distal stenoses with distal irregularity.  The vessel wrapped around the apex. RECOMMENDATION: DAPT for minimum of 1 year.  We will continue bivalirudin for several hours post procedure particularly since Brilinta was not administered initially was administered in the latter portion of the intervention when the patient had awakened.  Medical therapy for concomitant CAD.  Nephrology will need to be contacted.  The patient undergoes hemodialysis on Monday, Wednesday, and Friday.  Aggressive lipid-lowering therapy with target LDL less than 70.  2D echo Doppler study will be scheduled.   ECHOCARDIOGRAM COMPLETE  Result Date: 06/21/2019    ECHOCARDIOGRAM REPORT   Patient Name:   CHRLES SELLEY Date of Exam: 06/21/2019 Medical Rec #:  [id number]       Height:       72.0 in Accession #:    [id number]      Weight:       153.7  lb Date of Birth:  [birth year]       BSA:          1.905 m Patient Age:    69 years        BP:           113/79 mmHg Patient Gender: M               HR:           101 bpm. Exam Location:  Inpatient Procedure: 2D Echo,  Cardiac Doppler and Color Doppler Indications:    121-121.4 ST elevation (STEMI) and non-ST elevation (NSTEMI)                 myocardial infarction  History:        Patient has prior history of Echocardiogram examinations, most                 recent 09/05/2016. CHF, Acute MI and Previous Myocardial                 Infarction, Abnormal ECG, Stroke, Arrythmias:Atrial                 Fibrillation, Signs/Symptoms:Syncope, Alzheimer's and Altered                 Mental Status; Risk Factors:Hypertension.  Sonographer:    Roseanna Rainbow RDCS Referring Phys: [place]  1. Left ventricular ejection fraction, by estimation, is 25 to 30%. The left ventricle has severely decreased function. The left ventricle demonstrates regional wall motion abnormalities (see scoring diagram/findings for description). There is severe left ventricular hypertrophy. Left ventricular diastolic parameters are indeterminate. There is the interventricular septum is flattened in systole and diastole, consistent with right ventricular pressure and volume overload.  2. Right ventricular systolic function is normal. The right ventricular size is normal. There is moderately elevated pulmonary artery systolic pressure. The estimated right ventricular systolic pressure is 49.7 mmHg.  3. Left atrial size was moderately dilated.  4. The mitral valve is normal in structure. Moderate mitral valve regurgitation. No evidence of mitral stenosis.  5. Tricuspid valve regurgitation is moderate.  6. The aortic valve is tricuspid. Aortic valve regurgitation is trivial. Mild aortic valve sclerosis is present, with no evidence of aortic valve stenosis.  7. There is mild dilatation at the level of the sinuses of Valsalva measuring 40 mm.  8. The inferior vena cava is dilated in size with <50% respiratory variability, suggesting right atrial pressure of 15 mmHg. Comparison(s): Prior images reviewed side by side. The left ventricular function is worsened.  FINDINGS  Left Ventricle: Left ventricular ejection fraction, by estimation, is 25 to 30%. The left ventricle has severely decreased function. The left ventricle demonstrates regional wall motion abnormalities. The left ventricular internal cavity size was normal  in size. There is severe left ventricular hypertrophy. The interventricular septum is flattened in systole and diastole, consistent with right ventricular pressure and volume overload. Left ventricular diastolic parameters are indeterminate.  LV Wall Scoring: The inferior septum, apical lateral segment, and apex are akinetic. Right Ventricle: The right ventricular size is normal. No increase in right ventricular wall thickness. Right ventricular systolic function is normal. There is moderately elevated pulmonary artery systolic pressure. The tricuspid regurgitant velocity is 3.03 m/s, and with an assumed right atrial pressure of 15 mmHg, the estimated right ventricular systolic pressure is 02.6 mmHg. Left Atrium: Left atrial size was moderately dilated. Right Atrium: Right atrial size was normal in  size. Pericardium: There is no evidence of pericardial effusion. Mitral Valve: The mitral valve is normal in structure. Normal mobility of the mitral valve leaflets. Mild mitral annular calcification. Moderate mitral valve regurgitation. No evidence of mitral valve stenosis. Tricuspid Valve: The tricuspid valve is normal in structure. Tricuspid valve regurgitation is moderate . No evidence of tricuspid stenosis. Aortic Valve: The aortic valve is tricuspid. Aortic valve regurgitation is trivial. Mild aortic valve sclerosis is present, with no evidence of aortic valve stenosis. Pulmonic Valve: The pulmonic valve was normal in structure. Pulmonic valve regurgitation is mild. No evidence of pulmonic stenosis. Aorta: The aortic root is normal in size and structure. There is mild dilatation at the level of the sinuses of Valsalva measuring 40 mm. Venous: The inferior  vena cava is dilated in size with less than 50% respiratory variability, suggesting right atrial pressure of 15 mmHg. IAS/Shunts: No atrial level shunt detected by color flow Doppler.  LEFT VENTRICLE PLAX 2D LVIDd:         4.60 cm     Diastology LVIDs:         3.70 cm     LV e' lateral: 7.43 cm/s LV PW:         1.40 cm     LV e' medial:  3.83 cm/s LV IVS:        1.70 cm LVOT diam:     2.20 cm LV SV:         38 LV SV Index:   20 LVOT Area:     3.80 cm  LV Volumes (MOD) LV vol d, MOD A2C: 97.9 ml LV vol d, MOD A4C: 88.8 ml LV vol s, MOD A2C: 89.2 ml LV vol s, MOD A4C: 64.1 ml LV SV MOD A2C:     8.7 ml LV SV MOD A4C:     88.8 ml LV SV MOD BP:      18.3 ml RIGHT VENTRICLE            IVC RV S prime:     3.68 cm/s  IVC diam: 3.00 cm TAPSE (M-mode): 0.6 cm LEFT ATRIUM             Index       RIGHT ATRIUM           Index LA diam:        5.20 cm 2.73 cm/m  RA Area:     21.80 cm LA Vol (A2C):   80.8 ml 42.42 ml/m RA Volume:   67.40 ml  35.38 ml/m LA Vol (A4C):   80.1 ml 42.05 ml/m LA Biplane Vol: 82.2 ml 43.15 ml/m  AORTIC VALVE             PULMONIC VALVE LVOT Vmax:   64.00 cm/s  PR End Diast Vel: 2.05 msec LVOT Vmean:  42.800 cm/s LVOT VTI:    0.099 m  AORTA Ao Root diam: 4.00 cm MR Peak grad:    62.4 mmHg   TRICUSPID VALVE MR Mean grad:    44.0 mmHg   TR Peak grad:   36.7 mmHg MR Vmax:         395.00 cm/s TR Vmax:        303.00 cm/s MR Vmean:        309.0 cm/s MR PISA:         0.84 cm    SHUNTS MR PISA Eff ROA: 9 mm       Systemic VTI:  0.10 m MR PISA  Radius:  0.37 cm     Systemic Diam: 2.20 cm Candee Furbish MD Electronically signed by Candee Furbish MD Signature Date/Time: 06/21/2019/2:00:38 PM    Final     Cardiac Studies   EF 25-30%  Patient Profile     69 y.o. male with anterior MI.  Assessment & Plan    DAPT, high dose statin and beta blocker.  Despite atrial flutter, I don't think he is a candidate for DOAC.    I spoke to sister, Vivien Rota, who also mentioned that he has some confusion.    ESRD: Dialysis  per renal.  Potassium is better today.   Case manager to help with placement back to [facility].  COuld move to tele if no bed available to [facility].   For questions or updates, please contact [place] Please consult [email or web address] for contact info under        Signed, Larae Grooms, MD  06/22/2019, 11:19 AM

## 2019-06-23 NOTE — Telephone Encounter (Signed)
Only number listed is [facility] where he resides

## 2019-06-30 ENCOUNTER — Ambulatory Visit: Payer: Medicare Other | Admitting: Medical

## 2019-07-13 ENCOUNTER — Emergency Department (HOSPITAL_COMMUNITY): Payer: Medicare Other

## 2019-07-13 ENCOUNTER — Encounter (HOSPITAL_COMMUNITY): Payer: Self-pay | Admitting: Emergency Medicine

## 2019-07-13 ENCOUNTER — Inpatient Hospital Stay (HOSPITAL_COMMUNITY)
Admission: EM | Admit: 2019-07-13 | Discharge: 2019-07-17 | DRG: 689 | Disposition: A | Discharge status: Skilled Nursing Facility | Payer: Medicare Other | Source: Skilled Nursing Facility | Attending: Family Medicine | Admitting: Family Medicine

## 2019-07-13 DIAGNOSIS — I25119 Atherosclerotic heart disease of native coronary artery with unspecified angina pectoris: Secondary | ICD-10-CM | POA: Diagnosis not present

## 2019-07-13 DIAGNOSIS — I214 Non-ST elevation (NSTEMI) myocardial infarction: Secondary | ICD-10-CM | POA: Diagnosis present

## 2019-07-13 DIAGNOSIS — R079 Chest pain, unspecified: Secondary | ICD-10-CM

## 2019-07-13 DIAGNOSIS — Z7901 Long term (current) use of anticoagulants: Secondary | ICD-10-CM

## 2019-07-13 DIAGNOSIS — N186 End stage renal disease: Secondary | ICD-10-CM | POA: Diagnosis present

## 2019-07-13 DIAGNOSIS — E875 Hyperkalemia: Secondary | ICD-10-CM | POA: Diagnosis present

## 2019-07-13 DIAGNOSIS — R0789 Other chest pain: Secondary | ICD-10-CM

## 2019-07-13 DIAGNOSIS — Z7982 Long term (current) use of aspirin: Secondary | ICD-10-CM

## 2019-07-13 DIAGNOSIS — Z8673 Personal history of transient ischemic attack (TIA), and cerebral infarction without residual deficits: Secondary | ICD-10-CM | POA: Diagnosis not present

## 2019-07-13 DIAGNOSIS — D631 Anemia in chronic kidney disease: Secondary | ICD-10-CM | POA: Diagnosis present

## 2019-07-13 DIAGNOSIS — N390 Urinary tract infection, site not specified: Principal | ICD-10-CM

## 2019-07-13 DIAGNOSIS — Z79899 Other long term (current) drug therapy: Secondary | ICD-10-CM

## 2019-07-13 DIAGNOSIS — E785 Hyperlipidemia, unspecified: Secondary | ICD-10-CM | POA: Diagnosis present

## 2019-07-13 DIAGNOSIS — R000 Tachycardia, unspecified: Secondary | ICD-10-CM

## 2019-07-13 DIAGNOSIS — Z89512 Acquired absence of left leg below knee: Secondary | ICD-10-CM

## 2019-07-13 DIAGNOSIS — I5042 Chronic combined systolic (congestive) and diastolic (congestive) heart failure: Secondary | ICD-10-CM | POA: Diagnosis present

## 2019-07-13 DIAGNOSIS — I132 Hypertensive heart and chronic kidney disease with heart failure and with stage 5 chronic kidney disease, or end stage renal disease: Secondary | ICD-10-CM | POA: Diagnosis present

## 2019-07-13 DIAGNOSIS — Z20822 Contact with and (suspected) exposure to covid-19: Secondary | ICD-10-CM | POA: Diagnosis present

## 2019-07-13 DIAGNOSIS — E872 Acidosis: Secondary | ICD-10-CM | POA: Diagnosis present

## 2019-07-13 DIAGNOSIS — Z992 Dependence on renal dialysis: Secondary | ICD-10-CM | POA: Diagnosis not present

## 2019-07-13 DIAGNOSIS — F0390 Unspecified dementia without behavioral disturbance: Secondary | ICD-10-CM | POA: Diagnosis present

## 2019-07-13 DIAGNOSIS — I959 Hypotension, unspecified: Secondary | ICD-10-CM | POA: Diagnosis not present

## 2019-07-13 DIAGNOSIS — I4821 Permanent atrial fibrillation: Secondary | ICD-10-CM | POA: Diagnosis present

## 2019-07-13 DIAGNOSIS — R4182 Altered mental status, unspecified: Secondary | ICD-10-CM | POA: Diagnosis not present

## 2019-07-13 DIAGNOSIS — E1122 Type 2 diabetes mellitus with diabetic chronic kidney disease: Secondary | ICD-10-CM | POA: Diagnosis present

## 2019-07-13 DIAGNOSIS — G40909 Epilepsy, unspecified, not intractable, without status epilepticus: Secondary | ICD-10-CM

## 2019-07-13 DIAGNOSIS — Z87891 Personal history of nicotine dependence: Secondary | ICD-10-CM

## 2019-07-13 DIAGNOSIS — I482 Chronic atrial fibrillation, unspecified: Secondary | ICD-10-CM | POA: Diagnosis not present

## 2019-07-13 DIAGNOSIS — E1151 Type 2 diabetes mellitus with diabetic peripheral angiopathy without gangrene: Secondary | ICD-10-CM | POA: Diagnosis present

## 2019-07-13 DIAGNOSIS — I4892 Unspecified atrial flutter: Secondary | ICD-10-CM | POA: Diagnosis present

## 2019-07-13 DIAGNOSIS — Z955 Presence of coronary angioplasty implant and graft: Secondary | ICD-10-CM

## 2019-07-13 DIAGNOSIS — I2510 Atherosclerotic heart disease of native coronary artery without angina pectoris: Secondary | ICD-10-CM

## 2019-07-13 DIAGNOSIS — R197 Diarrhea, unspecified: Secondary | ICD-10-CM | POA: Diagnosis not present

## 2019-07-13 LAB — CBC WITH DIFFERENTIAL/PLATELET
Abs Immature Granulocytes: 0.09 10*3/uL — ABNORMAL HIGH (ref 0.00–0.07)
Basophils Absolute: 0 10*3/uL (ref 0.0–0.1)
Basophils Relative: 0 %
Eosinophils Absolute: 0 10*3/uL (ref 0.0–0.5)
Eosinophils Relative: 0 %
HCT: 38 % — ABNORMAL LOW (ref 39.0–52.0)
Hemoglobin: 12.6 g/dL — ABNORMAL LOW (ref 13.0–17.0)
Immature Granulocytes: 1 %
Lymphocytes Relative: 4 %
Lymphs Abs: 0.6 10*3/uL — ABNORMAL LOW (ref 0.7–4.0)
MCH: 30 pg (ref 26.0–34.0)
MCHC: 33.2 g/dL (ref 30.0–36.0)
MCV: 90.5 fL (ref 80.0–100.0)
Monocytes Absolute: 1.3 10*3/uL — ABNORMAL HIGH (ref 0.1–1.0)
Monocytes Relative: 10 %
Neutro Abs: 11.5 10*3/uL — ABNORMAL HIGH (ref 1.7–7.7)
Neutrophils Relative %: 85 %
Platelets: 95 10*3/uL — ABNORMAL LOW (ref 150–400)
RBC: 4.2 MIL/uL — ABNORMAL LOW (ref 4.22–5.81)
RDW: 14.7 % (ref 11.5–15.5)
WBC: 13.5 10*3/uL — ABNORMAL HIGH (ref 4.0–10.5)
nRBC: 0 % (ref 0.0–0.2)

## 2019-07-13 LAB — BASIC METABOLIC PANEL
Anion gap: 21 — ABNORMAL HIGH (ref 5–15)
BUN: 70 mg/dL — ABNORMAL HIGH (ref 8–23)
CO2: 19 mmol/L — ABNORMAL LOW (ref 22–32)
Calcium: 8.8 mg/dL — ABNORMAL LOW (ref 8.9–10.3)
Chloride: 95 mmol/L — ABNORMAL LOW (ref 98–111)
Creatinine, Ser: 11.34 mg/dL — ABNORMAL HIGH (ref 0.61–1.24)
GFR calc Af Amer: 5 mL/min — ABNORMAL LOW (ref >60)
GFR calc non Af Amer: 4 mL/min — ABNORMAL LOW (ref >60)
Glucose, Bld: 106 mg/dL — ABNORMAL HIGH (ref 70–99)
Potassium: 5.7 mmol/L — ABNORMAL HIGH (ref 3.5–5.1)
Sodium: 135 mmol/L (ref 135–145)

## 2019-07-13 LAB — URINALYSIS, ROUTINE W REFLEX MICROSCOPIC
Bilirubin Urine: NEGATIVE
Glucose, UA: NEGATIVE mg/dL
Ketones, ur: NEGATIVE mg/dL
Nitrite: NEGATIVE
Protein, ur: 100 mg/dL — AB
Specific Gravity, Urine: 1.013 (ref 1.005–1.030)
WBC, UA: >50 WBC/hpf — ABNORMAL HIGH (ref 0–5)
pH: 5 (ref 5.0–8.0)

## 2019-07-13 LAB — MRSA PCR SCREENING: MRSA by PCR: POSITIVE — AB

## 2019-07-13 LAB — TROPONIN I (HIGH SENSITIVITY)
Troponin I (High Sensitivity): 257 ng/L (ref <18)
Troponin I (High Sensitivity): 252 ng/L (ref <18)
Troponin I (High Sensitivity): 216 ng/L (ref <18)

## 2019-07-13 LAB — MAGNESIUM: Magnesium: 1.7 mg/dL (ref 1.7–2.4)

## 2019-07-13 LAB — SARS CORONAVIRUS 2 BY RT PCR (HOSPITAL ORDER, PERFORMED IN ~~LOC~~ HOSPITAL LAB): SARS Coronavirus 2: NEGATIVE

## 2019-07-13 LAB — POTASSIUM: Potassium: 4.3 mmol/L (ref 3.5–5.1)

## 2019-07-13 MED ORDER — LEVETIRACETAM 500 MG PO TABS
500.0000 mg | ORAL_TABLET | Freq: Two times a day (BID) | ORAL | Status: DC
  Administered 2019-07-13 – 2019-07-17 (×8): 500 mg via ORAL
  Filled 2019-07-13 (×8): qty 1

## 2019-07-13 MED ORDER — NEPHRO VITAMINS 0.8 MG PO TABS: 1.0000 | ORAL_TABLET | Freq: Every day | ORAL | Status: DC

## 2019-07-13 MED ORDER — ATORVASTATIN CALCIUM 80 MG PO TABS
80.0000 mg | ORAL_TABLET | Freq: Every day | ORAL | Status: DC
  Administered 2019-07-13 – 2019-07-16 (×4): 80 mg via ORAL
  Filled 2019-07-13 (×4): qty 1

## 2019-07-13 MED ORDER — HEPARIN SODIUM (PORCINE) 5000 UNIT/ML IJ SOLN
5000.0000 [IU] | Freq: Three times a day (TID) | INTRAMUSCULAR | Status: DC
  Administered 2019-07-13 – 2019-07-17 (×9): 5000 [IU] via SUBCUTANEOUS
  Filled 2019-07-13 (×9): qty 1

## 2019-07-13 MED ORDER — SODIUM ZIRCONIUM CYCLOSILICATE 10 G PO PACK: 10.0000 g | PACK | Freq: Once | ORAL | Status: DC

## 2019-07-13 MED ORDER — METOPROLOL TARTRATE 12.5 MG HALF TABLET
12.5000 mg | ORAL_TABLET | Freq: Once | ORAL | Status: AC
  Administered 2019-07-13: 12.5 mg via ORAL
  Filled 2019-07-13: qty 1

## 2019-07-13 MED ORDER — ASPIRIN 81 MG PO CHEW
81.0000 mg | CHEWABLE_TABLET | Freq: Every day | ORAL | Status: DC
  Administered 2019-07-14 – 2019-07-17 (×4): 81 mg via ORAL
  Filled 2019-07-13 (×4): qty 1

## 2019-07-13 MED ORDER — LAMOTRIGINE 25 MG PO TABS
25.0000 mg | ORAL_TABLET | Freq: Every day | ORAL | Status: DC
  Administered 2019-07-13 – 2019-07-16 (×4): 25 mg via ORAL
  Filled 2019-07-13 (×5): qty 1

## 2019-07-13 MED ORDER — SODIUM CHLORIDE 0.9 % IV SOLN
1.0000 g | Freq: Once | INTRAVENOUS | Status: DC
  Filled 2019-07-13: qty 10

## 2019-07-13 MED ORDER — ONDANSETRON HCL 4 MG/2ML IJ SOLN: 4.0000 mg | Freq: Four times a day (QID) | INTRAMUSCULAR | Status: DC | PRN

## 2019-07-13 MED ORDER — METOPROLOL SUCCINATE ER 25 MG PO TB24: 25.0000 mg | ORAL_TABLET | Freq: Every day | ORAL | Status: DC

## 2019-07-13 MED ORDER — ACETAMINOPHEN 325 MG PO TABS
650.0000 mg | ORAL_TABLET | ORAL | Status: DC | PRN
  Administered 2019-07-14 – 2019-07-16 (×3): 650 mg via ORAL
  Filled 2019-07-13 (×3): qty 2

## 2019-07-13 MED ORDER — FAMOTIDINE 20 MG PO TABS
20.0000 mg | ORAL_TABLET | Freq: Every day | ORAL | Status: DC
  Administered 2019-07-14 – 2019-07-17 (×4): 20 mg via ORAL
  Filled 2019-07-13 (×4): qty 1

## 2019-07-13 MED ORDER — NITROGLYCERIN 0.4 MG SL SUBL: 0.4000 mg | SUBLINGUAL_TABLET | SUBLINGUAL | Status: DC | PRN

## 2019-07-13 MED ORDER — METOPROLOL SUCCINATE ER 25 MG PO TB24
25.0000 mg | ORAL_TABLET | Freq: Every day | ORAL | Status: DC
  Filled 2019-07-13: qty 1

## 2019-07-13 MED ORDER — ISOSORBIDE MONONITRATE ER 30 MG PO TB24
30.0000 mg | ORAL_TABLET | Freq: Every evening | ORAL | Status: DC
  Administered 2019-07-13: 30 mg via ORAL
  Filled 2019-07-13: qty 1

## 2019-07-13 MED ORDER — NEPRO/CARBSTEADY PO LIQD
237.0000 mL | Freq: Two times a day (BID) | ORAL | Status: DC
  Administered 2019-07-16 – 2019-07-17 (×2): 237 mL via ORAL
  Filled 2019-07-13: qty 237

## 2019-07-13 MED ORDER — TICAGRELOR 90 MG PO TABS
90.0000 mg | ORAL_TABLET | Freq: Two times a day (BID) | ORAL | Status: DC
  Administered 2019-07-14 – 2019-07-17 (×7): 90 mg via ORAL
  Filled 2019-07-13 (×8): qty 1

## 2019-07-13 MED ORDER — RENA-VITE PO TABS
1.0000 | ORAL_TABLET | Freq: Every day | ORAL | Status: DC
  Administered 2019-07-13 – 2019-07-16 (×4): 1 via ORAL
  Filled 2019-07-13 (×5): qty 1

## 2019-07-13 MED ORDER — SODIUM CHLORIDE 0.9 % IV SOLN
500.0000 mg | INTRAVENOUS | Status: DC
  Administered 2019-07-13 – 2019-07-15 (×3): 500 mg via INTRAVENOUS
  Filled 2019-07-13 (×5): qty 0.5

## 2019-07-13 MED ORDER — METOPROLOL SUCCINATE ER 25 MG PO TB24: 12.5000 mg | ORAL_TABLET | Freq: Every day | ORAL | Status: DC

## 2019-07-13 MED ORDER — CHLORHEXIDINE GLUCONATE CLOTH 2 % EX PADS
6.0000 | MEDICATED_PAD | Freq: Every day | CUTANEOUS | Status: DC
  Administered 2019-07-14: 6 via TOPICAL

## 2019-07-13 NOTE — ED Provider Notes (Signed)
  Face-to-face evaluation   History: Patient presents from his facility for evaluation of confusional state.  He also reported chest pain and skipped dialysis, yesterday.  Physical exam: Patient was alert responsive but confused.  Heart is tachycardic without murmur.  Lungs clear anteriorly.  Fistula left upper arm does not have a palpable thrill, there is a pulse below it in the brachial artery region.  Medical screening examination/treatment/procedure(s) were conducted as a shared visit with non-physician practitioner(s) and myself.  I personally evaluated the patient during the encounter    Daleen Bo, MD 07/13/19 [phone]

## 2019-07-13 NOTE — Consult Note (Addendum)
[facility]  INPATIENT CONSULTATION  Reason for Consultation: ESRD, hyperkalemia Requesting Provider: Dr. Eulis Foster  HPI: George Burgess is an 69 y.o. male ESRD on dialysis, A. fib, dementia, p Atrial fib, CHF (combined), Seizure d/o, diffuse CAD with recent STEMI with PCI to pLAD (06/2019) who is seen for evaluation and management of ESRD and hyperkalemia.    Pt poor historian and I can't get a reliable story from him.  Per report was sent from SNF for ^ confusion.  He apparently refused to go to dialysis yesterday.  He tells me he was feeling weak.  He has endorsed abdominal and chest pain to some providers.   In the ED he's tachycardic in the 120s, having hiccups, BPs in the 110/60-70s.  Cardiology has been consulted and will evaluate him in the ED.    I called his brother [name] listed as NOK on records and went straight to VM x2.   PMH: Past Medical History:  Diagnosis Date  . Atrial fibrillation ([place])    a. Chronic Eliquis (CHA2DS2VASc = 6).  . Chronic combined systolic (congestive) and diastolic (congestive) heart failure ([place])    a. Previously reported EF of 30%;  b. 08/2016 Echo: EF 40-45%, antsept, inf, infsept HK, Gr3 DD, mod AI/MR, sev dil LA, mild TR, PASP 85mmHg.  Marland Kitchen Dementia ([place])   . Encephalopathy   . End stage renal disease ([place])    a. On HD.  Marland Kitchen Epilepsy, unspecified, not intractable, without status epilepticus ([place]) 03/21/2018   provided from [facility] records  . Essential hypertension   . GIB (gastrointestinal bleeding)    a. 08/2016 in setting of Eliquis Rx.  Marland Kitchen Hyperlipidemia   . Pleural effusion   . PVD (peripheral vascular disease) ([place])    a. s/p L BKA;  b. Chronic LE wounds.  . Stroke ([place])   . Type II diabetes mellitus (HCC)    PSH: Past Surgical History:  Procedure Laterality Date  . BELOW KNEE LEG AMPUTATION Left   . CORONARY/GRAFT ACUTE MI REVASCULARIZATION N/A 06/20/2019   Procedure: Coronary/Graft Acute MI Revascularization;   Surgeon: Troy Sine, MD;  Location: [facility];  Service: Cardiovascular;  Laterality: N/A;  . FLEXIBLE SIGMOIDOSCOPY N/A 09/07/2016   Procedure: FLEXIBLE SIGMOIDOSCOPY;  Surgeon: Manus Gunning, MD;  Location: [facility];  Service: Gastroenterology;  Laterality: N/A;  . LEFT HEART CATH AND CORONARY ANGIOGRAPHY N/A 06/20/2019   Procedure: LEFT HEART CATH AND CORONARY ANGIOGRAPHY;  Surgeon: Troy Sine, MD;  Location: [facility];  Service: Cardiovascular;  Laterality: N/A;    Past Medical History:  Diagnosis Date  . Atrial fibrillation ([place])    a. Chronic Eliquis (CHA2DS2VASc = 6).  . Chronic combined systolic (congestive) and diastolic (congestive) heart failure ([place])    a. Previously reported EF of 30%;  b. 08/2016 Echo: EF 40-45%, antsept, inf, infsept HK, Gr3 DD, mod AI/MR, sev dil LA, mild TR, PASP 34mmHg.  Marland Kitchen Dementia ([place])   . Encephalopathy   . End stage renal disease ([place])    a. On HD.  Marland Kitchen Epilepsy, unspecified, not intractable, without status epilepticus ([place]) 03/21/2018   provided from [facility] records  . Essential hypertension   . GIB (gastrointestinal bleeding)    a. 08/2016 in setting of Eliquis Rx.  Marland Kitchen Hyperlipidemia   . Pleural effusion   . PVD (peripheral vascular disease) ([place])    a. s/p L BKA;  b. Chronic LE wounds.  Marland Kitchen  Stroke ([place])   . Type II diabetes mellitus (HCC)     Medications:  I have reviewed the patient's current medications.  (Not in a hospital admission)   ALLERGIES:  No Known Allergies  FAM HX: Family History  Problem Relation Age of Onset  . Other Mother        Pt unsure of PMH of family members.    Social History:   reports that he has quit smoking. He has never used smokeless tobacco. He reports that he does not drink alcohol or use drugs.  ROS: unable to obtain from confused patient  Blood pressure 114/67, pulse (!) 127, temperature 98.9 F (37.2 C), temperature source Oral, resp. rate (!)  25, SpO2 98 %. PHYSICAL EXAM: Gen: chronically ill lying in stretcher flat hiccuping but in no distress  Eyes: icteric, muddy sclera ENT: MM tacky Neck: supple CV:  Tachycardic, seems regular Abd: soft, thin, nontender Lungs: clear anteriorly, normal WOB on RA lying flat GU: no foley Extr:  L BKA, R leg no edema with kerlix dressing wrapping tibia, no drainage on dressing; LUE AVF dressing from last HD removed, + t/b, some swelling of that arm noted; hypopigmentation over areas of frequent cannulation, no skin breakdown Neuro: oriented to self and hospital Skin: warm and dry   Results for orders placed or performed during the hospital encounter of 07/13/19 (from the past 48 hour(s))  Basic metabolic panel     Status: Abnormal   Collection Time: 07/13/19 12:19 PM  Result Value Ref Range   Sodium 135 135 - 145 mmol/L   Potassium 5.7 (H) 3.5 - 5.1 mmol/L    Comment: SLIGHT HEMOLYSIS   Chloride 95 (L) 98 - 111 mmol/L   CO2 19 (L) 22 - 32 mmol/L   Glucose, Bld 106 (H) 70 - 99 mg/dL    Comment: Glucose reference range applies only to samples taken after fasting for at least 8 hours.   BUN 70 (H) 8 - 23 mg/dL   Creatinine, Ser 11.34 (H) 0.61 - 1.24 mg/dL   Calcium 8.8 (L) 8.9 - 10.3 mg/dL   GFR calc non Af Amer 4 (L) >60 mL/min   GFR calc Af Amer 5 (L) >60 mL/min   Anion gap 21 (H) 5 - 15    Comment: Performed at [facility] [address] [place], [place] [address]  Troponin I (High Sensitivity)     Status: Abnormal   Collection Time: 07/13/19 12:19 PM  Result Value Ref Range   Troponin I (High Sensitivity) 257 (HH) <18 ng/L    Comment: CRITICAL RESULT CALLED TO, READ BACK BY AND VERIFIED WITH: [email or web address] AT 1324 07/13/2019 BY ZBEECH. (NOTE) Elevated high sensitivity troponin I (hsTnI) values and significant  changes across serial measurements may suggest ACS but many other  chronic and acute conditions are known to elevate hsTnI results.  Refer to the Links  section for chest pain algorithms and additional  guidance. Performed at [facility], [place] [address] [place], [place] [address]   CBC with Differential     Status: Abnormal   Collection Time: 07/13/19 12:20 PM  Result Value Ref Range   WBC 13.5 (H) 4.0 - 10.5 K/uL   RBC 4.20 (L) 4.22 - 5.81 MIL/uL   Hemoglobin 12.6 (L) 13.0 - 17.0 g/dL   HCT 38.0 (L) 39.0 - 52.0 %   MCV 90.5 80.0 - 100.0 fL   MCH 30.0 26.0 - 34.0 pg   MCHC  33.2 30.0 - 36.0 g/dL   RDW 14.7 11.5 - 15.5 %   Platelets 95 (L) 150 - 400 K/uL    Comment: SPECIMEN CHECKED FOR CLOTS Immature Platelet Fraction may be clinically indicated, consider ordering this additional test [id number] PLATELET COUNT CONFIRMED BY SMEAR REPEATED TO VERIFY    nRBC 0.0 0.0 - 0.2 %   Neutrophils Relative % 85 %   Neutro Abs 11.5 (H) 1.7 - 7.7 K/uL   Lymphocytes Relative 4 %   Lymphs Abs 0.6 (L) 0.7 - 4.0 K/uL   Monocytes Relative 10 %   Monocytes Absolute 1.3 (H) 0.1 - 1.0 K/uL   Eosinophils Relative 0 %   Eosinophils Absolute 0.0 0.0 - 0.5 K/uL   Basophils Relative 0 %   Basophils Absolute 0.0 0.0 - 0.1 K/uL   Immature Granulocytes 1 %   Abs Immature Granulocytes 0.09 (H) 0.00 - 0.07 K/uL    Comment: Performed at [facility], [address], [place], [place] [address]    DG Chest Port 1 View  Result Date: 07/13/2019 CLINICAL DATA:  Chest pain EXAM: PORTABLE CHEST 1 VIEW COMPARISON:  Jun 20, 2019 FINDINGS: Lungs are clear. There is cardiomegaly with pulmonary vascularity within normal limits. No evident adenopathy. Mild soft tissue prominence in the superior mediastinum likely represents great vessel prominence. No pneumothorax. No bone lesions. IMPRESSION: Cardiomegaly. Lungs clear. Stable mild prominence in the superior mediastinum. Electronically Signed   By: Lowella Grip III M.D.   On: 07/13/2019 12:53   Dialysis Orders: Center: [facility]  on MWF .  Assessment/Plan  1. Confusion, report of CP:  Given  poor historian unclear etiology of presentation.  Cardiology to evaluate in the ED in light of recent STEMI/LAD PCI and possible CP.   Per primary  2. ESRD:  K+ 5.7 initially but on recheck 4.3 and CXR clear. No indication for urgent HD today. Will plan HD tomorrow and then resume TTS thereafter. No heparin with HD.  3.  Anemia: Hgb at goal, no ESA indicated at present.  4.  Metabolic bone disease: Calcium controlled, no phosphorus reported yet. Calcium carbonate on outpatient med list but no other binder listed. Will check phos with labs tomorrow and follow.  5. CAD s/p recent PCI:  Per above, cardiology to evaluate patient in ED.   6. Seizure DO  Call with issues.   Justin Mend 07/13/2019, 2:47 PM

## 2019-07-13 NOTE — Consult Note (Signed)
Cardiology Consultation:   Patient ID: George Burgess MRN: [id number]; DOB: [birth year]  Admit date: 07/13/2019 Date of Consult: 07/13/2019  Primary Care Provider: Caprice Renshaw, MD Primary Cardiologist: Shelva Majestic, MD  Primary Electrophysiologist:  None    Patient Profile:   George Burgess is a 69 y.o. male with a hx of DM2, HTN, ESRD on HD, prior CVA, mild dementia, afib not on a/c, chronic combined systolic and diastolic CHF (EF 06-30% in 08/2016, recently 25-30% 06/21/19), CAD with recent acute anterior ST elevation MI s/p DES to pLAD who is being seen today for the evaluation of tachycardia at the request of Dr. Erin Hearing.  History of Present Illness:   Mr. Hair is followed by Dr. Claiborne Billings for the above cardiac issues. He was admitted 06/20/19-5/4 with Anterior STEMI taken for emergent cath. History generally difficult to obtain. Cath showed total occlusion of the pLAD managed with PCI/DES as well as severe dRCA, ramus, and 1st diagonal, mod ost LAD and mild mRCA which were medically managed. DAPT with aspirin and brilinta. Despite CHADSVASC of 7 patient was felt to be a poor candidate for anticoagulation. metoprolol continued for rate control. EF found to be decreased to 25-30% from previous 40-45% in 2018.   The patient presented to the ED 07/13/19 from [facility] for confusion and chest pain. History very difficult to obtain. Per chart review patient has been confused over the last couple days and had been complaining of intermittent chest pain. Patient did not have HD yesterday. Denies fever, chills, sob, N/V, abd pain, LLE, orthopnea.   In the ED BP 103/71, pulse 129, afebrile, RR 29. Labs showed potassium 5.7 with re-check at 4.3, WBC 13.5, Hgb 12.6. Hs troponin 257>252. CXR with cardiomegaly with no acute process. EKG showed aflutter with known RBB with rate 127 bpm. Magnesium 1.7. UA with UTI. COVID negative. Nephrology saw in the ED and plan for HD tomorrow.   Past Medical History:   Diagnosis Date  . Atrial fibrillation ([place])    a. Chronic Eliquis (CHA2DS2VASc = 6).  . Chronic combined systolic (congestive) and diastolic (congestive) heart failure ([place])    a. Previously reported EF of 30%;  b. 08/2016 Echo: EF 40-45%, antsept, inf, infsept HK, Gr3 DD, mod AI/MR, sev dil LA, mild TR, PASP 51mmHg.  Marland Kitchen Dementia ([place])   . Encephalopathy   . End stage renal disease ([place])    a. On HD.  Marland Kitchen Epilepsy, unspecified, not intractable, without status epilepticus ([place]) 03/21/2018   provided from [facility] records  . Essential hypertension   . GIB (gastrointestinal bleeding)    a. 08/2016 in setting of Eliquis Rx.  Marland Kitchen Hyperlipidemia   . Pleural effusion   . PVD (peripheral vascular disease) ([place])    a. s/p L BKA;  b. Chronic LE wounds.  . Stroke ([place])   . Type II diabetes mellitus ([place])     Past Surgical History:  Procedure Laterality Date  . BELOW KNEE LEG AMPUTATION Left   . CORONARY/GRAFT ACUTE MI REVASCULARIZATION N/A 06/20/2019   Procedure: Coronary/Graft Acute MI Revascularization;  Surgeon: Troy Sine, MD;  Location: [facility];  Service: Cardiovascular;  Laterality: N/A;  . FLEXIBLE SIGMOIDOSCOPY N/A 09/07/2016   Procedure: FLEXIBLE SIGMOIDOSCOPY;  Surgeon: Manus Gunning, MD;  Location: [facility];  Service: Gastroenterology;  Laterality: N/A;  . LEFT HEART CATH AND CORONARY ANGIOGRAPHY N/A 06/20/2019   Procedure: LEFT HEART CATH AND CORONARY ANGIOGRAPHY;  Surgeon: Troy Sine, MD;  Location:  [facility];  Service: Cardiovascular;  Laterality: N/A;     Home Medications:  Prior to Admission medications   Medication Sig Start Date End Date Taking? Authorizing Provider  acetaminophen (TYLENOL) 325 MG tablet Take 650 mg by mouth every 4 (four) hours as needed (for pain).    Yes [provider]  aspirin 81 MG tablet Chew 81 mg by mouth daily.   Yes [provider]  atorvastatin (LIPITOR) 80 MG tablet Take 1  tablet (80 mg total) by mouth daily at 6 PM. Patient taking differently: Take 80 mg by mouth at bedtime.  06/22/19  Yes Kroeger, Daleen Snook M., PA-C  B Complex-C-Folic Acid (NEPHRO VITAMINS) 0.8 MG TABS Take 1 tablet by mouth daily.   Yes [provider]  calcium carbonate (TUMS EX) 750 MG chewable tablet Chew 1 tablet by mouth daily with supper.    Yes [provider]  ethyl chloride spray Apply 1 application topically every Monday, Wednesday, and Friday. Given at diaylasis   Yes [provider]  famotidine (PEPCID) 20 MG tablet Take 20 mg by mouth daily.   Yes [provider]  isosorbide mononitrate (IMDUR) 30 MG 24 hr tablet Take 30 mg by mouth every evening.    Yes [provider]  lamoTRIgine (LAMICTAL) 25 MG tablet Take 25 mg by mouth at bedtime.    Yes [provider]  levETIRAcetam (KEPPRA) 500 MG tablet Take 1 tablet (500 mg total) by mouth 2 (two) times daily. 03/21/18  Yes Danford, Suann Larry, MD  metoprolol succinate (TOPROL-XL) 25 MG 24 hr tablet Take 12.5 mg by mouth daily. 06/10/19  Yes [provider]  nitroGLYCERIN (NITROSTAT) 0.4 MG SL tablet Place 0.4 mg under the tongue every 5 (five) minutes x 3 doses as needed for chest pain.    Yes [provider]  Nutritional Supplements (FEEDING SUPPLEMENT, NEPRO CARB STEADY,) LIQD Take 237 mLs by mouth 2 (two) times daily between meals.    Yes [provider]  ticagrelor (BRILINTA) 90 MG TABS tablet Take 1 tablet (90 mg total) by mouth 2 (two) times daily. 06/22/19  Yes Kroeger, Daleen Snook M., PA-C  cefTRIAXone (ROCEPHIN) 1 g injection Inject 1 g into the muscle daily. For 5 days Start date 07/13/19 End date 07/18/19    [provider]    Inpatient Medications: Scheduled Meds: . [START ON 07/14/2019] aspirin  81 mg Oral Daily  . atorvastatin  80 mg Oral QHS  . [START ON 07/14/2019] Chlorhexidine Gluconate Cloth  6 each Topical Q0600  . [START ON 07/14/2019]  famotidine  20 mg Oral Daily  . [START ON 07/14/2019] feeding supplement (NEPRO CARB STEADY)  237 mL Oral BID BM  . heparin  5,000 Units Subcutaneous Q8H  . isosorbide mononitrate  30 mg Oral QPM  . lamoTRIgine  25 mg Oral QHS  . levETIRAcetam  500 mg Oral BID  . [START ON 07/14/2019] metoprolol succinate  12.5 mg Oral Daily  . multivitamin  1 tablet Oral QHS  . [START ON 07/14/2019] ticagrelor  90 mg Oral BID   Continuous Infusions: . cefTRIAXone (ROCEPHIN)  IV     PRN Meds:   Allergies:   No Known Allergies  Social History:   Social History   Socioeconomic History  . Marital status: Single    Spouse name: Not on file  . Number of children: Not on file  . Years of education: Not on file  . Highest education level: Not on file  Occupational History  . Occupation: retired/disabled  Tobacco Use  . Smoking status: Former Research scientist (life sciences)  . Smokeless tobacco: Never Used  . Tobacco comment: Pt thinks he used to smoke cigarettes.  Thinks he quit many years ago.  Substance and Sexual Activity  . Alcohol use: No  . Drug use: No  . Sexual activity: Never  Other Topics Concern  . Not on file  Social History Narrative   Lives @ [facility].  Sedentary.   Social Determinants of Health   Financial Resource Strain:   . Difficulty of Paying Living Expenses:   Food Insecurity:   . Worried About Charity fundraiser in the Last Year:   . Arboriculturist in the Last Year:   Transportation Needs:   . Film/video editor (Medical):   Marland Kitchen Lack of Transportation (Non-Medical):   Physical Activity:   . Days of Exercise per Week:   . Minutes of Exercise per Session:   Stress:   . Feeling of Stress :   Social Connections:   . Frequency of Communication with Friends and Family:   . Frequency of Social Gatherings with Friends and Family:   . Attends Religious Services:   . Active Member of Clubs or Organizations:   . Attends Archivist Meetings:   Marland Kitchen Marital Status:   Intimate  Partner Violence:   . Fear of Current or Ex-Partner:   . Emotionally Abused:   Marland Kitchen Physically Abused:   . Sexually Abused:     Family History:    Family History  Problem Relation Age of Onset  . Other Mother        Pt unsure of PMH of family members.     ROS:  Please see the history of present illness.  All other ROS reviewed and negative.     Physical Exam/Data:   Vitals:   07/13/19 1315 07/13/19 1430 07/13/19 1530 07/13/19 1615  BP: 114/67 103/71 104/66 112/67  Pulse: (!) 127 (!) 129 (!) 126 (!) 125  Resp: (!) 25 (!) 29 (!) 22 (!) 25  Temp:      TempSrc:      SpO2: 98% 99% 99% 100%   No intake or output data in the 24 hours ending 07/13/19 1756 Last 3 Weights 06/22/2019 06/21/2019 06/21/2019  Weight (lbs) 143 lb 1.3 oz 152 lb 1.9 oz 154 lb 5.2 oz  Weight (kg) 64.9 kg 69 kg 70 kg     There is no height or weight on file to calculate BMI.  General:  Well nourished, well developed, in no acute distress HEENT: normal Lymph: no adenopathy Neck: no JVD Endocrine:  No thryomegaly Vascular: No carotid bruits; FA pulses 2+ bilaterally without bruits  Cardiac:  normal S1, S2; RRR; no murmur  Lungs:  clear to auscultation bilaterally, no wheezing, rhonchi or rales  Abd: soft, nontender, no hepatomegaly  Ext: no edema Musculoskeletal:  No deformities, BUE and BLE strength normal and equal Skin: warm and dry  Neuro:  A&Ox2 Psych:  Normal affect   EKG:  The EKG was personally reviewed and demonstrates:  Aflutter, 127 bpm Telemetry:  Telemetry was personally reviewed and demonstrates:  N/A  Relevant CV Studies:  Echo 06/21/19 1. Left ventricular ejection fraction, by estimation, is 25 to 30%. The  left ventricle has severely decreased function. The left ventricle  demonstrates regional wall motion abnormalities (see scoring  diagram/findings for description). There is severe  left ventricular hypertrophy. Left ventricular diastolic parameters are  indeterminate. There is the  interventricular septum is flattened in  systole and diastole, consistent with right ventricular pressure and  volume overload.  2. Right ventricular systolic function is normal. The right ventricular  size is normal. There is moderately elevated pulmonary artery systolic  pressure. The estimated right ventricular systolic pressure is 34.1 mmHg.  3. Left atrial size was moderately dilated.  4. The mitral valve is normal in structure. Moderate mitral valve  regurgitation. No evidence of mitral stenosis.  5. Tricuspid valve regurgitation is moderate.  6. The aortic valve is tricuspid. Aortic valve regurgitation is trivial.  Mild aortic valve sclerosis is present, with no evidence of aortic valve  stenosis.  7. There is mild dilatation at the level of the sinuses of Valsalva  measuring 40 mm.  8. The inferior vena cava is dilated in size with <50% respiratory  variability, suggesting right atrial pressure of 15 mmHg.   Comparison(s): Prior images reviewed side by side. The left ventricular  function is worsened.   Cardiac cath 06/20/19  1st Mrg lesion is 70% stenosed.  Ramus lesion is 80% stenosed.  RPDA lesion is 80% stenosed.  Mid RCA lesion is 30% stenosed.  Ost LAD to Prox LAD lesion is 40% stenosed.  1st Diag lesion is 70% stenosed.  Prox LAD to Mid LAD lesion is 100% stenosed.  Post intervention, there is a 0% residual stenosis.  A stent was successfully placed.   Total flush occlusion of the proximal LAD with TIMI 0 flow at the site immediately beyond the first proximal diagonal vessel in this patient with delayed presentation and chest pain duration for approximately 2 days.  Multi-vessel CAD with percent smooth ostial narrowing of the LAD followed by total occlusion with TIMI 0 flow mediated beyond a diffusely diseased first diagonal vessel; 80% superior branch stenosis of the ramus intermediate vessel; diffuse irregularity of the circumflex marginal vessels  with diffuse 70% stenosis in the OM1 vessel and small distal marginal and distal circumflex vessels with diffuse irregularity; dominant RCA with 30% mid stenosis and 80% mid PDA stenosis.  There were faint septal collaterals to LAD septals but the LAD was not visualized from the RCA injection.  Difficult but successful PCI to the total LAD occlusion with PTCA and ultimate insertion of a 3.0 x 30 mm Resolute DES stent postdilated to 3.29 mm with the under percent occlusion being reduced to 0%.  There is no change in the smooth 40% ostial stenosis.  The mid distal LAD had 60% mid stenosis followed by 30% distal stenoses with distal irregularity.  The vessel wrapped around the apex.  RECOMMENDATION: DAPT for minimum of 1 year.  We will continue bivalirudin for several hours post procedure particularly since Brilinta was not administered initially was administered in the latter portion of the intervention when the patient had awakened.  Medical therapy for concomitant CAD.  Nephrology will need to be contacted.  The patient undergoes hemodialysis on Monday, Wednesday, and Friday.  Aggressive lipid-lowering therapy with target LDL less than 70.  2D echo Doppler study will be scheduled.  Coronary Diagrams  Diagnostic Dominance: Right  Intervention      Laboratory Data:  High Sensitivity Troponin:   Recent Labs  Lab 06/20/19 0838 06/20/19 1053 06/20/19 1443 07/13/19 1219 07/13/19 1429  TROPONINIHS 906* 814* 23,179* 257* 252*     Chemistry Recent Labs  Lab 07/13/19 1219 07/13/19 1429  NA 135  --   K 5.7* 4.3  CL 95*  --  CO2 19*  --   GLUCOSE 106*  --   BUN 70*  --   CREATININE 11.34*  --   CALCIUM 8.8*  --   GFRNONAA 4*  --   GFRAA 5*  --   ANIONGAP 21*  --     No results for input(s): PROT, ALBUMIN, AST, ALT, ALKPHOS, BILITOT in the last 168 hours. Hematology Recent Labs  Lab 07/13/19 1220  WBC 13.5*  RBC 4.20*  HGB 12.6*  HCT 38.0*  MCV 90.5  MCH 30.0  MCHC  33.2  RDW 14.7  PLT 95*   BNPNo results for input(s): BNP, PROBNP in the last 168 hours.  DDimer No results for input(s): DDIMER in the last 168 hours.   Radiology/Studies:  DG Chest Port 1 View  Result Date: 07/13/2019 CLINICAL DATA:  Chest pain EXAM: PORTABLE CHEST 1 VIEW COMPARISON:  Jun 20, 2019 FINDINGS: Lungs are clear. There is cardiomegaly with pulmonary vascularity within normal limits. No evident adenopathy. Mild soft tissue prominence in the superior mediastinum likely represents great vessel prominence. No pneumothorax. No bone lesions. IMPRESSION: Cardiomegaly. Lungs clear. Stable mild prominence in the superior mediastinum. Electronically Signed   By: Lowella Grip III M.D.   On: 07/13/2019 12:53   HEAR Score (for undifferentiated chest pain):  HEAR Score: 5    Assessment and Plan:   Confusion - Reported worse over the last couple days. Patient is A&O x 2 on my exam - UA with UTI and labs with leukocytosis - per IM  Chest pain/CAD s/p recent STEMI with DES to pLAD - DAPT with Aspirin and Brilinta for 1 year. Denies missing doses - endorses intermittent chest pain. Currently chest pain free - HS troponin 257>252. Trend appears to be flat but continue to trend - EKG with aflutter with elevated rates - continue Imdur - Cath 5/2 did show residual CAD. Will discuss plan with MD but doubt we will pursue ischemic work-up given flat troponin and atypical chest pain.   Persistent afib/flutter with RVR  - not on a/c due to being poor candidate - Toprol-XL 12.5 mg daily for rate control - rates still up in the setting of UTI and volume overload. restart Toprol and will increase to 25 mg. Can increase tomorrow if rates are still up - Continue telemetry  chronic combined systolic and diastolic CHF - Echo 07/26/01 with reduced EF of 25-30%, severe LVH, LA moderately dilated, mod MR, mod TR, trivial AI - CXR with no acute process - Volume management per HD - continue BB and  Imdur  ESRD on HD - nephrology plan for HD tomorrow - TTS  HTN - Toprol-XL and Imdur - pressures stable  HLD - atorvastatin 80 mg daily - LDL 33   For questions or updates, please contact [facility] Please consult [email or web address] for contact info under     Signed, Tiesha Marich Ninfa Meeker, PA-C  07/13/2019 5:56 PM

## 2019-07-13 NOTE — ED Provider Notes (Addendum)
[facility] EMERGENCY DEPARTMENT Provider Note   CSN: [id number] Arrival date & time: 07/13/19  1145     History Chief Complaint  Patient presents with  . Chest Pain   Level 5 caveat due to dementia George Burgess is a 69 y.o. male with history of ESRD on dialysis Monday Wednesday Friday, A. fib on Eliquis not currently anticoagulated, CHF, dementia/cognitive impairment, hyperlipidemia, type 2 diabetes mellitus, peripheral vascular disease status post left BKA presents brought in by EMS for evaluation of intermittent chest pains.  The patient himself on my assessment denies any chest pain at this time.  When initially asked what brought him in here today he states "I was having a stroke".  When asked about chest pain he states "oh yeah I have been having that ". To my attending physician he endorsed abdominal pain but denied any to me. He denies any chest pain at this time.  He has difficulty describing his chest pain or characterizing it. He feels generally weak and nauseated.  He is currently hiccuping.  No vomiting.   12:26 PM Spoke with Beckie Busing at [facility].  She is not the patient's nurse but she tells me that reportedly the patient is typically alert and oriented x4 but was found to be confused with staff over the last couple of days.  Is also complaining of intermittent chest pain.  He was not dialyzed yesterday as he refused dialysis.  The history is provided by the patient.       Past Medical History:  Diagnosis Date  . Atrial fibrillation ([place])    a. Chronic Eliquis (CHA2DS2VASc = 6).  . Chronic combined systolic (congestive) and diastolic (congestive) heart failure ([place])    a. Previously reported EF of 30%;  b. 08/2016 Echo: EF 40-45%, antsept, inf, infsept HK, Gr3 DD, mod AI/MR, sev dil LA, mild TR, PASP 52mmHg.  Marland Kitchen Dementia ([place])   . Encephalopathy   . End stage renal disease ([place])    a. On HD.  Marland Kitchen Epilepsy, unspecified, not intractable, without  status epilepticus ([place]) 03/21/2018   provided from [facility] records  . Essential hypertension   . GIB (gastrointestinal bleeding)    a. 08/2016 in setting of Eliquis Rx.  Marland Kitchen Hyperlipidemia   . Pleural effusion   . PVD (peripheral vascular disease) ([place])    a. s/p L BKA;  b. Chronic LE wounds.  . Stroke ([place])   . Type II diabetes mellitus [facility])     Patient Active Problem List   Diagnosis Date Noted  . Chest pain 07/13/2019  . STEMI (ST elevation myocardial infarction) ([place]) 06/20/2019  . Dementia ([place])   . Chronic combined systolic (congestive) and diastolic (congestive) heart failure ([place])   . PVD (peripheral vascular disease) ([place])   . Stroke ([place])   . Seizure ([place]) 09/11/2017  . ESRD on hemodialysis ([place]) 09/11/2017  . Elevated troponin 09/11/2017  . Atrial fibrillation, chronic 09/11/2017  . 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[place]) 09/11/2017  . Essential hypertension 09/11/2017  . Hypoglycemia 09/11/2017  . Syncope 09/10/2017  . Blindness   . Bloody stools   . Encounter for nasogastric (NG) tube placement   . Lower GI bleed   . Malnutrition of moderate degree 09/05/2016  . Acute GI bleeding 09/04/2016    Past Surgical History:  Procedure Laterality Date  . BELOW KNEE LEG AMPUTATION Left   . CORONARY/GRAFT ACUTE MI REVASCULARIZATION N/A 06/20/2019   Procedure: Coronary/Graft Acute MI  Revascularization;  Surgeon: Troy Sine, MD;  Location: [facility] LAB;  Service: Cardiovascular;  Laterality: N/A;  . FLEXIBLE SIGMOIDOSCOPY N/A 09/07/2016   Procedure: FLEXIBLE SIGMOIDOSCOPY;  Surgeon: Manus Gunning, MD;  Location: [facility]OS[facility];  Service: Gastroenterology;  Laterality: N/A;  . LEFT HEART CATH AND CORONARY ANGIOGRAPHY N/A 06/20/2019   Procedure: LEFT HEART CATH AND CORONARY ANGIOGRAPHY;  Surgeon: Troy Sine, MD;  Location: [facility] LAB;  Service: Cardiovascular;  Laterality: N/A;       Family History  Problem Relation Age of  Onset  . Other Mother        Pt unsure of PMH of family members.    Social History   Tobacco Use  . Smoking status: Former Research scientist (life sciences)  . Smokeless tobacco: Never Used  . Tobacco comment: Pt thinks he used to smoke cigarettes.  Thinks he quit many years ago.  Substance Use Topics  . Alcohol use: No  . Drug use: No    Home Medications Prior to Admission medications   Medication Sig Start Date End Date Taking? Authorizing Provider  acetaminophen (TYLENOL) 325 MG tablet Take 650 mg by mouth every 4 (four) hours as needed (for pain).    Yes [provider]  aspirin 81 MG tablet Chew 81 mg by mouth daily.   Yes [provider]  atorvastatin (LIPITOR) 80 MG tablet Take 1 tablet (80 mg total) by mouth daily at 6 PM. Patient taking differently: Take 80 mg by mouth at bedtime.  06/22/19  Yes Kroeger, Daleen Snook M., PA-C  B Complex-C-Folic Acid (NEPHRO VITAMINS) 0.8 MG TABS Take 1 tablet by mouth daily.   Yes [provider]  calcium carbonate (TUMS EX) 750 MG chewable tablet Chew 1 tablet by mouth daily with supper.    Yes [provider]  ethyl chloride spray Apply 1 application topically every Monday, Wednesday, and Friday. Given at diaylasis   Yes [provider]  famotidine (PEPCID) 20 MG tablet Take 20 mg by mouth daily.   Yes [provider]  isosorbide mononitrate (IMDUR) 30 MG 24 hr tablet Take 30 mg by mouth every evening.    Yes [provider]  lamoTRIgine (LAMICTAL) 25 MG tablet Take 25 mg by mouth at bedtime.    Yes [provider]  levETIRAcetam (KEPPRA) 500 MG tablet Take 1 tablet (500 mg total) by mouth 2 (two) times daily. 03/21/18  Yes Danford, Suann Larry, MD  metoprolol succinate (TOPROL-XL) 25 MG 24 hr tablet Take 12.5 mg by mouth daily. 06/10/19  Yes [provider]  nitroGLYCERIN (NITROSTAT) 0.4 MG SL tablet Place 0.4 mg under the tongue every 5 (five) minutes x 3 doses as needed for chest pain.    Yes  [provider]  Nutritional Supplements (FEEDING SUPPLEMENT, NEPRO CARB STEADY,) LIQD Take 237 mLs by mouth 2 (two) times daily between meals.    Yes [provider]  ticagrelor (BRILINTA) 90 MG TABS tablet Take 1 tablet (90 mg total) by mouth 2 (two) times daily. 06/22/19  Yes Kroeger, Daleen Snook M., PA-C  cefTRIAXone (ROCEPHIN) 1 g injection Inject 1 g into the muscle daily. For 5 days Start date 07/13/19 End date 07/18/19    [provider]    Allergies    Patient has no known allergies.  Review of Systems   Review of Systems  Unable to perform ROS: Dementia    Physical Exam Updated Vital Signs BP 103/71   Pulse (!) 129  Temp 98.9 F (37.2 C) (Oral)   Resp (!) 29   SpO2 99%   Physical Exam Vitals and nursing note reviewed.  Constitutional:      General: He is not in acute distress.    Appearance: He is well-developed.  HENT:     Head: Normocephalic and atraumatic.  Eyes:     General:        Right eye: No discharge.        Left eye: No discharge.     Conjunctiva/sclera: Conjunctivae normal.  Neck:     Vascular: No JVD.     Trachea: No tracheal deviation.  Cardiovascular:     Rate and Rhythm: Regular rhythm. Tachycardia present.     Pulses:          Radial pulses are 2+ on the right side and 2+ on the left side.       Dorsalis pedis pulses are 2+ on the right side.     Comments: Left BKA.  Bevelyn Buckles' sign absent bilaterally.  Left AV fistula with no palpable thrill Pulmonary:     Effort: Pulmonary effort is normal.  Chest:     Chest wall: No tenderness.  Abdominal:     General: There is no distension.     Palpations: Abdomen is soft.     Tenderness: There is no abdominal tenderness. There is no guarding or rebound.  Musculoskeletal:     Cervical back: Neck supple.  Skin:    General: Skin is warm and dry.     Findings: No erythema.  Neurological:     Mental Status: He is alert.     Comments: No facial droop noted.  Patient confused but  oriented to person place and day of the week. Moves extremities spontaneously without difficulty.  Psychiatric:        Behavior: Behavior normal.     ED Results / Procedures / Treatments   Labs (all labs ordered are listed, but only abnormal results are displayed) Labs Reviewed  BASIC METABOLIC PANEL - Abnormal; Notable for the following components:      Result Value   Potassium 5.7 (*)    Chloride 95 (*)    CO2 19 (*)    Glucose, Bld 106 (*)    BUN 70 (*)    Creatinine, Ser 11.34 (*)    Calcium 8.8 (*)    GFR calc non Af Amer 4 (*)    GFR calc Af Amer 5 (*)    Anion gap 21 (*)    All other components within normal limits  CBC WITH DIFFERENTIAL/PLATELET - Abnormal; Notable for the following components:   WBC 13.5 (*)    RBC 4.20 (*)    Hemoglobin 12.6 (*)    HCT 38.0 (*)    Platelets 95 (*)    Neutro Abs 11.5 (*)    Lymphs Abs 0.6 (*)    Monocytes Absolute 1.3 (*)    Abs Immature Granulocytes 0.09 (*)    All other components within normal limits  TROPONIN I (HIGH SENSITIVITY) - Abnormal; Notable for the following components:   Troponin I (High Sensitivity) 257 (*)    All other components within normal limits  TROPONIN I (HIGH SENSITIVITY) - Abnormal; Notable for the following components:   Troponin I (High Sensitivity) 252 (*)    All other components within normal limits  URINE CULTURE  SARS CORONAVIRUS 2 BY RT PCR (HOSPITAL ORDER, [facility])  POTASSIUM  MAGNESIUM  URINALYSIS,  ROUTINE W REFLEX MICROSCOPIC    EKG EKG Interpretation  Date/Time:  Tuesday Jul 13 2019 11:55:31 EDT Ventricular Rate:  127 PR Interval:    QRS Duration: 184 QT Interval:  385 QTC Calculation: 560 R Axis:   -135 Text Interpretation: indeterminate wide QRS tachycardia Ventricular premature complex Right bundle branch block Since last tracing rate faster Otherwise no significant change Confirmed by Daleen Bo [phone]) on 07/13/2019 12:05:58  PM   Radiology DG Chest Port 1 View  Result Date: 07/13/2019 CLINICAL DATA:  Chest pain EXAM: PORTABLE CHEST 1 VIEW COMPARISON:  Jun 20, 2019 FINDINGS: Lungs are clear. There is cardiomegaly with pulmonary vascularity within normal limits. No evident adenopathy. Mild soft tissue prominence in the superior mediastinum likely represents great vessel prominence. No pneumothorax. No bone lesions. IMPRESSION: Cardiomegaly. Lungs clear. Stable mild prominence in the superior mediastinum. Electronically Signed   By: Lowella Grip III M.D.   On: 07/13/2019 12:53    Procedures Procedures (including critical care time)  Medications Ordered in ED Medications  Chlorhexidine Gluconate Cloth 2 % PADS 6 each (has no administration in time range)    ED Course  I have reviewed the triage vital signs and the nursing notes.  Pertinent labs & imaging results that were available during my care of the patient were reviewed by me and considered in my medical decision making (see chart for details).    MDM Rules/Calculators/A&P                     Patient presenting for evaluation of altered mental status, intermittent chest pains.  He is a poor historian, very difficult to understand specifically what symptoms he is experiencing.  He is afebrile, persistently tachycardic in the ED, intermittently tachypneic, vital signs otherwise stable.  He is nontoxic in appearance.  AV fistula difficult to palpate thrill.  He had a recent admission for NSTEMI and was started on dual antiplatelet therapy after stent placement.  EKG today shows tachycardia, right bundle branch block but otherwise no acute ischemic abnormalities.  Lab work reviewed and interpreted by myself shows leukocytosis of 13.5, stable anemia, elevated BUN and creatinine worse compared to blood work that was obtained yesterday (documentation at the bedside shows creatinine of around 9, potassium 4.5).  BMP shows hyperkalemia with potassium 5.7, however  there is slight hemolysis to the sample so we will repeat this as well as obtaining a magnesium level.  Chest x-ray shows cardiomegaly but no evidence of significant volume overload.    2:36 PM CONSULT: Spoke with Dr. Justin Mend with nephrology; he would like to wait to see what cardiology recommends but feels the patient is stable for dialysis tomorrow. He agrees with rechecking the potassium to confirm level but it does not appear critically elevated at this time.  Nephrology service will also check to see if the patient's fistula is occluded.  2:37 PM CONSULT: Spoke with Trish, flow manager with cardiology.  She recommends medical admission with plan for emergent Cardiologic evaluation in the ED.  Spoke with Dr. Susa Simmonds with family medicine teaching service who agrees to assume care of patient and bring him into the hospital for further evaluation and management.  3:59 PM Patient was able to provide a urine sample which does appear infected.  Will culture urine and give dose of IV Rocephin in the ED.  This could explain his leukocytosis and his altered mental status.  Final Clinical Impression(s) / ED Diagnoses Final diagnoses:  Atypical chest pain  Tachycardia  Altered mental status, unspecified altered mental status type    Rx / DC Orders ED Discharge Orders    None       Renita Papa, PA-C 07/13/19 [place], [place], PA-C 07/13/19 1559    Daleen Bo, MD 07/13/19 [phone]

## 2019-07-13 NOTE — H&P (Addendum)
[facility] Admission History and Physical Service Pager: [phone]  Patient name: George Burgess Medical record number: [id number] Date of birth: March 15, 1950 Age: 69 y.o. Gender: male  Primary Care Provider: Caprice Renshaw, MD Consultants: Nephrology, Cardiology  Code Status: FULL Preferred Emergency Contact: Alfonse Ras [phone]  Chief Complaint:  Chest pain   Assessment and Plan: George Burgess is a 69 y.o. male presenting with intermittent chest pain. PMH is significant for recent acute MI (06/20/2019), ESRD, CAD, HLD CHF, previous CVA, atrial fibrillation/flutter, dementia, hypertension   Chest Pain  ACS Rule Out  CAD  Patient with intermittent chest pain for the past week.  Ports chest pain intermittently in the ED. Patient admitted for NSTEMI with troponin greater than 23,000 on Jun 20, 2019.  During that admission had a left heart cath that showed total occlusion of the LAD and multivessel coronary artery disease.  LAD was stented.  Patient on low-dose aspirin, Brillinta and 12.5 mg metoprolol daily.  Follows with [facility] (Dr. Claiborne Billings?).  Cardiology was consulted by the ED provider and will see the patient. No ACS suspected at this time. Initial troponin 257 trended flat (repeat 252).  We will repeat series of troponins.  Patient tachycardic, HR 130s, elevated troponin could be due to ischemic demand. Patient does not meets SIRS criteria, but given dysuria & evidence of UTI, infection could exacerbate a fib. Patient also has end-stage renal disease do not feel elevation is solely related to this.  Await further cardiac recommendations.  - Admit to cardiac telemetry, attending Dr. Erin Hearing  - Trend hstroponins  - Repeat EKG in AM  - Consider repeat ECHO  - Cardiac monitoring  - Continuous pulse oximetry  - Vitals per unit routine  - SW consult  - PT / OT evalaute and treat  - Out of bed with assistance  - AM CBC, BMP  - [place] for  additional information  -Continue metoprolol 12.5 mg daily -Continue 81 mg aspirin  ESRD  Patient missed dialysis yesterday.  Scheduled MWF.  Last dialysis session on Friday. Baseline.BUN 70.  - Nephrology consulted, appreciate recommendations  - HD per nephrology   UTI Patient had klebsiella and enterobacter clocae cystitis in 2018-that was sensitive to gentamicin, imipenem, macrobid.  UA on admission concerning for infection with many bacteria and large leukocytes seen.  Patient reporting dysuria.  Given patient was systems as a ceftriaxone in the past we will start merepenem instead. -Meropenem per pharmacy -Follow-up urine culture  Prior CVA  Stable.  Patient with out residual deficits on Aspirin 81 mg and Brillianta. Possibly continued aphasia?  - Continue home medications   Atrial fibrillation / flutter Home meds include: No anticoagulation as patient has history of GI bleed and is fall risk. Takes metoprolol for rate control.  Follows with cardiology, ?Dr. Claiborne Billings with [facility].  EKG indicated   indeterminate wide QRS tachycardia Ventricular premature complex Right bundle branch block. CHADS-VASC score: 7, HASBLED: score 3.   - Continue home meds  Hyperlipidemia Patient home medication is Lipitor 80mg . Patient with recent MI earlier this month (see above for additional information).  - Continue home Lipitor dose.   Hypertension Blood pressures since admission have ranged between 103/71 - 119/84.  Home meds include Metoprolol XL and imdur daily. - Continue home meds - Monitor blood pressures  Diabetes mellitus Blood sugar 106 on admission.  Patient takes no medication for this.  Appears to be diet controlled. Last A1c was 6.3 on 06/20/19.  -  monitor with AM RFP  HFrEF Last echo on 06/21/2019 showed EF 25-30%, LV regional wall motion abnormality, severe LVH, moderately elevated RV systolic pressure. Home meds include Metoprolol XL and imdur daily. - Cardiology consulted,  appreciate recommendations  - Continue home medications   FEN/GI: heart healthy diet, replete electrolytes as needed  Prophylaxis: Heparin   Disposition: Admit to cardiac telemetry   History of Present Illness:  George Burgess is a 69 y.o. male presented via EMS from [facility] for intermittent shortness of breath.  Rehab staff reported that patient was confused complaining of chest pain.  Patient reporting intermittent chest pain from right and left chest radiating to left shoulder.  Denies numbness or tingling in his extremities.  Reports that he has had a cough that is nonproductive for "a while".  Denies sore throat and headache.  No known recent sick contacts. In the ED patient's troponin was elevated at 257 and cardiology was consulted.    Patient with history of end-stage renal disease and nephrology saw patient in the ED.  Patient missed dialysis on Monday.  Last session was Friday.  Nephrology awaiting cardiology recommendations before HD.    Review Of Systems: Per HPI with the following additions:   Review of Systems  HENT: Positive for sore throat. Negative for congestion.   Eyes: Positive for blurred vision.  Respiratory: Positive for cough and sputum production. Negative for hemoptysis, shortness of breath and wheezing.   Cardiovascular: Positive for chest pain and palpitations.  Gastrointestinal: Negative for abdominal pain, blood in stool, constipation, diarrhea, heartburn and vomiting.  Genitourinary: Positive for dysuria.  Musculoskeletal: Positive for back pain. Negative for joint pain, myalgias and neck pain.  Skin:       RLE wound  Neurological: Negative for tingling and headaches.  Psychiatric/Behavioral:       No sleep disturbance     Patient Active Problem List   Diagnosis Date Noted  . STEMI (ST elevation myocardial infarction) ([place]) 06/20/2019  . Dementia ([place])   . Chronic combined systolic (congestive) and diastolic (congestive) heart  failure ([place])   . PVD (peripheral vascular disease) ([place])   . Stroke ([place])   . Seizure ([place]) 09/11/2017  . ESRD on hemodialysis ([place]) 09/11/2017  . Elevated troponin 09/11/2017  . Atrial fibrillation, chronic 09/11/2017  . 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[place]) 09/11/2017  . Essential hypertension 09/11/2017  . Hypoglycemia 09/11/2017  . Syncope 09/10/2017  . Blindness   . Bloody stools   . Encounter for nasogastric (NG) tube placement   . Lower GI bleed   . Malnutrition of moderate degree 09/05/2016  . Acute GI bleeding 09/04/2016    Past Medical History: Past Medical History:  Diagnosis Date  . Atrial fibrillation ([place])    a. Chronic Eliquis (CHA2DS2VASc = 6).  . 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 Previously reported EF of 30%;  b. 08/2016 Echo: EF 40-45%, antsept, inf, infsept HK, Gr3 DD, mod AI/MR, sev dil LA, mild TR, PASP 11mmHg.  Marland Kitchen Dementia ([place])   . Encephalopathy   . End stage renal disease ([place])    a. On HD.  Marland Kitchen Epilepsy, unspecified, not intractable, without status epilepticus ([place]) 03/21/2018   provided from [facility] records  . Essential hypertension   . GIB (gastrointestinal bleeding)    a. 08/2016 in setting of Eliquis Rx.  Marland Kitchen Hyperlipidemia   . Pleural effusion   . PVD (  peripheral vascular disease) ([place])    a. s/p L BKA;  b. Chronic LE wounds.  . Stroke ([place])   . Type II diabetes mellitus ([place])     Past Surgical History: Past Surgical History:  Procedure Laterality Date  . BELOW KNEE LEG AMPUTATION Left   . CORONARY/GRAFT ACUTE MI REVASCULARIZATION N/A 06/20/2019   Procedure: Coronary/Graft Acute MI Revascularization;  Surgeon: Troy Sine, MD;  Location: [place] [facility];  Service: Cardiovascular;  Laterality: N/A;  . FLEXIBLE SIGMOIDOSCOPY N/A 09/07/2016   Procedure: FLEXIBLE SIGMOIDOSCOPY;  Surgeon: Manus Gunning, MD;  Location: [facility];  Service:  Gastroenterology;  Laterality: N/A;  . LEFT HEART CATH AND CORONARY ANGIOGRAPHY N/A 06/20/2019   Procedure: LEFT HEART CATH AND CORONARY ANGIOGRAPHY;  Surgeon: Troy Sine, MD;  Location: [facility];  Service: Cardiovascular;  Laterality: N/A;    Social History: Social History   Tobacco Use  . Smoking status: Former Research scientist (life sciences)  . Smokeless tobacco: Never Used  . Tobacco comment: Pt thinks he used to smoke cigarettes.  Thinks he quit many years ago.  Substance Use Topics  . Alcohol use: No  . Drug use: No   Additional social history:  Please also refer to relevant sections of EMR.  Family History: Family History  Problem Relation Age of Onset  . Other Mother        Pt unsure of PMH of family members.   Will inquire more about FMHx at next encounter.   Allergies and Medications: No Known Allergies No current facility-administered medications on file prior to encounter.   Current Outpatient Medications on File Prior to Encounter  Medication Sig Dispense Refill  . acetaminophen (TYLENOL) 325 MG tablet Take 650 mg by mouth every 4 (four) hours as needed (for pain).     Marland Kitchen aspirin 81 MG tablet Chew 81 mg by mouth daily.    Marland Kitchen atorvastatin (LIPITOR) 80 MG tablet Take 1 tablet (80 mg total) by mouth daily at 6 PM. (Patient taking differently: Take 80 mg by mouth at bedtime. ) 90 tablet 3  . B Complex-C-Folic Acid (NEPHRO VITAMINS) 0.8 MG TABS Take 1 tablet by mouth daily.    . calcium carbonate (TUMS EX) 750 MG chewable tablet Chew 1 tablet by mouth daily with supper.     . ethyl chloride spray Apply 1 application topically every Monday, Wednesday, and Friday. Given at diaylasis    . famotidine (PEPCID) 20 MG tablet Take 20 mg by mouth daily.    . isosorbide mononitrate (IMDUR) 30 MG 24 hr tablet Take 30 mg by mouth every evening.     . lamoTRIgine (LAMICTAL) 25 MG tablet Take 25 mg by mouth at bedtime.     . levETIRAcetam (KEPPRA) 500 MG tablet Take 1 tablet (500 mg total) by  mouth 2 (two) times daily. 60 tablet 3  . metoprolol succinate (TOPROL-XL) 25 MG 24 hr tablet Take 12.5 mg by mouth daily.    . nitroGLYCERIN (NITROSTAT) 0.4 MG SL tablet Place 0.4 mg under the tongue every 5 (five) minutes x 3 doses as needed for chest pain.     . Nutritional Supplements (FEEDING SUPPLEMENT, NEPRO CARB STEADY,) LIQD Take 237 mLs by mouth 2 (two) times daily between meals.     . ticagrelor (BRILINTA) 90 MG TABS tablet Take 1 tablet (90 mg total) by mouth 2 (two) times daily. 180 tablet 3  . cefTRIAXone (ROCEPHIN) 1 g injection Inject 1 g into the muscle daily. For  5 days Start date 07/13/19 End date 07/18/19      Objective: BP 103/71   Pulse (!) 129   Temp 98.9 F (37.2 C) (Oral)   Resp (!) 29   SpO2 99%  Exam: General: Alert, responsive to commands in no acute distress Eyes: Extraocular movements intact ENTM: Mucous membranes dry, oropharyngeal without lesions or exudate or erythema, no nasal discharge, nares patent Neck: Normal range of motion, no cervical lymphadenopathy Cardiovascular: Irregularly irregular rhythm, tachycardic, RUE fistula with good thrill  Respiratory: Clear to auscultation, not tachypneic on exam, satting well on room air Gastrointestinal: soft, non tender, non-distended, no-rebound or guarding, active bowel sounds   MSK: left BKA, right LE strength 5/5, bilateral UE strength 5/5 Derm: RLE chronic wound anterior shin, dressing clean dry and intact  Neuro: alert, oriented to person and place, able to recall family members names and picked the president out of list of three, occasionally studders  Psych: normal affect  Labs and Imaging: CBC BMET  Recent Labs  Lab 07/13/19 1220  WBC 13.5*  HGB 12.6*  HCT 38.0*  PLT 95*   Recent Labs  Lab 07/13/19 1219 07/13/19 1219 07/13/19 1429  NA 135  --   --   K 5.7*   < > 4.3  CL 95*  --   --   CO2 19*  --   --   BUN 70*  --   --   CREATININE 11.34*  --   --   GLUCOSE 106*  --   --   CALCIUM  8.8*  --   --    < > = values in this interval not displayed.      EKG Interpretation  Date/Time:                  Tuesday Jul 13 2019 11:55:31 EDT Ventricular Rate:         127 PR Interval:                   QRS Duration: 184 QT Interval:                 385 QTC Calculation:        560 R Axis:                         -135 Text Interpretation:      indeterminate wide QRS tachycardia Ventricular premature complex Right bundle branch block Since last tracing rate faster Otherwise no significant change Confirmed by Daleen Bo [phone]) on 07/13/2019 12:05:58 PM  DG Chest Port 1 View  Result Date: 07/13/2019 CLINICAL DATA:  Chest pain EXAM: PORTABLE CHEST 1 VIEW COMPARISON:  Jun 20, 2019 FINDINGS: Lungs are clear. There is cardiomegaly with pulmonary vascularity within normal limits. No evident adenopathy. Mild soft tissue prominence in the superior mediastinum likely represents great vessel prominence. No pneumothorax. No bone lesions. IMPRESSION: Cardiomegaly. Lungs clear. Stable mild prominence in the superior mediastinum. Electronically Signed   By: Lowella Grip III M.D.   On: 07/13/2019 12:53     Lyndee Hensen, DO 07/13/2019, 3:16 PM PGY-1, [place] Intern pager: [phone], text pages welcome FPTS Upper-Level Resident Addendum I have independently interviewed and examined the patient. I have discussed the above with the original author and agree with their documentation. My edits for correction/addition/clarification are in - purple. Please see also any attending notes.  FPTS Service pager: [phone] (text pages welcome through [facility])  Wilber Oliphant, M.D.  PGY-2 07/13/2019 10:43 PM

## 2019-07-13 NOTE — Progress Notes (Signed)
Medication ordered and given for HR given a

## 2019-07-13 NOTE — ED Triage Notes (Signed)
Pt arrives from [facility] where he is following having a Nstemi per ems. Pt states he began to have upper abd pain yesterday and was worse this morning, pt states pain has resolved now having hiccups. Pt is MWF dialysis pt but refused to go yesterday. Pt is alert and ox4.

## 2019-07-13 NOTE — Progress Notes (Addendum)
Patient Alert and oriented x3 disoriented to time. Patient skin intact and bed alarm set due to moderate fall risk, MRSA PCR Pending, call bell within reach. Patient oriented to plan of care. Patient has hiccups and currently denies chest pain, MD paged to notified about HR

## 2019-07-14 DIAGNOSIS — G40909 Epilepsy, unspecified, not intractable, without status epilepticus: Secondary | ICD-10-CM

## 2019-07-14 DIAGNOSIS — I25119 Atherosclerotic heart disease of native coronary artery with unspecified angina pectoris: Secondary | ICD-10-CM

## 2019-07-14 DIAGNOSIS — I482 Chronic atrial fibrillation, unspecified: Secondary | ICD-10-CM

## 2019-07-14 DIAGNOSIS — R000 Tachycardia, unspecified: Secondary | ICD-10-CM

## 2019-07-14 DIAGNOSIS — F0390 Unspecified dementia without behavioral disturbance: Secondary | ICD-10-CM

## 2019-07-14 LAB — CBC WITH DIFFERENTIAL/PLATELET
Abs Immature Granulocytes: 0.07 10*3/uL (ref 0.00–0.07)
Basophils Absolute: 0 10*3/uL (ref 0.0–0.1)
Basophils Relative: 0 %
Eosinophils Absolute: 0 10*3/uL (ref 0.0–0.5)
Eosinophils Relative: 0 %
HCT: 30.8 % — ABNORMAL LOW (ref 39.0–52.0)
Hemoglobin: 10.4 g/dL — ABNORMAL LOW (ref 13.0–17.0)
Immature Granulocytes: 1 %
Lymphocytes Relative: 3 %
Lymphs Abs: 0.4 10*3/uL — ABNORMAL LOW (ref 0.7–4.0)
MCH: 29.7 pg (ref 26.0–34.0)
MCHC: 33.8 g/dL (ref 30.0–36.0)
MCV: 88 fL (ref 80.0–100.0)
Monocytes Absolute: 1.1 10*3/uL — ABNORMAL HIGH (ref 0.1–1.0)
Monocytes Relative: 11 %
Neutro Abs: 9 10*3/uL — ABNORMAL HIGH (ref 1.7–7.7)
Neutrophils Relative %: 85 %
Platelets: 87 10*3/uL — ABNORMAL LOW (ref 150–400)
RBC: 3.5 MIL/uL — ABNORMAL LOW (ref 4.22–5.81)
RDW: 14.6 % (ref 11.5–15.5)
WBC: 10.6 10*3/uL — ABNORMAL HIGH (ref 4.0–10.5)
nRBC: 0 % (ref 0.0–0.2)

## 2019-07-14 LAB — RENAL FUNCTION PANEL
Albumin: 2.6 g/dL — ABNORMAL LOW (ref 3.5–5.0)
Anion gap: 16 — ABNORMAL HIGH (ref 5–15)
BUN: 79 mg/dL — ABNORMAL HIGH (ref 8–23)
CO2: 19 mmol/L — ABNORMAL LOW (ref 22–32)
Calcium: 8.4 mg/dL — ABNORMAL LOW (ref 8.9–10.3)
Chloride: 98 mmol/L (ref 98–111)
Creatinine, Ser: 12.4 mg/dL — ABNORMAL HIGH (ref 0.61–1.24)
GFR calc Af Amer: 4 mL/min — ABNORMAL LOW (ref >60)
GFR calc non Af Amer: 4 mL/min — ABNORMAL LOW (ref >60)
Glucose, Bld: 78 mg/dL (ref 70–99)
Phosphorus: 6.4 mg/dL — ABNORMAL HIGH (ref 2.5–4.6)
Potassium: 3.7 mmol/L (ref 3.5–5.1)
Sodium: 133 mmol/L — ABNORMAL LOW (ref 135–145)

## 2019-07-14 LAB — TROPONIN I (HIGH SENSITIVITY): Troponin I (High Sensitivity): 239 ng/L (ref <18)

## 2019-07-14 MED ORDER — CHLORHEXIDINE GLUCONATE CLOTH 2 % EX PADS
6.0000 | MEDICATED_PAD | Freq: Every day | CUTANEOUS | Status: DC
  Administered 2019-07-15 – 2019-07-16 (×2): 6 via TOPICAL

## 2019-07-14 MED ORDER — METOPROLOL SUCCINATE ER 25 MG PO TB24
25.0000 mg | ORAL_TABLET | Freq: Every day | ORAL | Status: DC
  Administered 2019-07-14 – 2019-07-17 (×3): 25 mg via ORAL
  Filled 2019-07-14 (×3): qty 1

## 2019-07-14 MED ORDER — METOPROLOL SUCCINATE ER 50 MG PO TB24: 50.0000 mg | ORAL_TABLET | Freq: Every day | ORAL | Status: DC

## 2019-07-14 MED ORDER — MUPIROCIN 2 % EX OINT
1.0000 "application " | TOPICAL_OINTMENT | Freq: Two times a day (BID) | CUTANEOUS | Status: DC
  Administered 2019-07-14 – 2019-07-17 (×6): 1 via NASAL
  Filled 2019-07-14 (×4): qty 22

## 2019-07-14 MED ORDER — SEVELAMER CARBONATE 800 MG PO TABS
800.0000 mg | ORAL_TABLET | Freq: Three times a day (TID) | ORAL | Status: DC
  Administered 2019-07-15 – 2019-07-17 (×5): 800 mg via ORAL
  Filled 2019-07-14 (×5): qty 1

## 2019-07-14 NOTE — Progress Notes (Signed)
Spoke with Selinda Eon, RN at [phone]  .Marland Kitchen Notified that HD orders modified by Dr Posey Pronto for 07/15/2019

## 2019-07-14 NOTE — Evaluation (Signed)
Occupational Therapy Evaluation Patient Details Name: George Burgess MRN: [id number] DOB: 08-18-1950 Today's Date: 07/14/2019    History of Present Illness 69 yo admitted with chest pain and increased confusion with tachycardia and elevated troponin thought to be related to UTI. PMHx: dementia, MI, left BKA, CVA, AFib, CHF, CAD, ESRD, HLD, HTN   Clinical Impression   PTA: Pt reports being from [facility] and performing most transfers and ADL tasks without assist. Pt currently, at baseline for ADL tasks. Pt seated for grooming tasks at EOB and able to bend to RLE to donn shoes/socks if needed. Pt scooting to EOB and to recliner with a combination of lateral scoot and squat piviot from bed <-> recliner. Pt with dementia at baseline and appears to be following 1-2 step commands with increased time. VSS.  BP at EOB with dizzines reported 105/62, 94 BPM. Pt  Appears close to baseline and does not require continued OT skilled services acutely. OT signing off.      Follow Up Recommendations  SNF;Supervision/Assistance - 24 hour    Equipment Recommendations  None recommended by OT    Recommendations for Other Services       Precautions / Restrictions Precautions Precautions: Fall Precaution Comments: left BKA, non ambulatory at baseline Restrictions Weight Bearing Restrictions: No      Mobility Bed Mobility Overal bed mobility: Needs Assistance Bed Mobility: Supine to Sit;Sit to Supine     Supine to sit: Min assist Sit to supine: Supervision   General bed mobility comments: minA to elevate trunk, cues to scoot to EOB. Cues to initiate with hand placement and use of rail  Transfers Overall transfer level: Needs assistance   Transfers: Lateral/Scoot Transfers;Squat Pivot Transfers     Squat pivot transfers: Min guard    Lateral/Scoot Transfers: Supervision General transfer comment: Scooting to EOB and to recliner with a combination of lateral scoot and squat piviot from  bed <-> recliner.    Balance Overall balance assessment: Needs assistance   Sitting balance-Leahy Scale: Good     Standing balance support: Bilateral upper extremity supported Standing balance-Leahy Scale: Poor                             ADL either performed or assessed with clinical judgement   ADL Overall ADL's : At baseline                                       General ADL Comments: Pt set-upA for UB ADL and requires assist for LB ADL. Pt tolerating session at EOB for grooming and lateral scooting to recliner <-> bed to get ready for dialysis.     Vision Baseline Vision/History: No visual deficits Patient Visual Report: No change from baseline Vision Assessment?: No apparent visual deficits     Perception     Praxis      Pertinent Vitals/Pain Pain Assessment: No/denies pain     Hand Dominance Right   Extremity/Trunk Assessment Upper Extremity Assessment Upper Extremity Assessment: Generalized weakness   Lower Extremity Assessment Lower Extremity Assessment: Generalized weakness LLE Deficits / Details: left BKA   Cervical / Trunk Assessment Cervical / Trunk Assessment: Other exceptions Cervical / Trunk Exceptions: rounded shoulders   Communication Communication Communication: No difficulties   Cognition Arousal/Alertness: Awake/alert Behavior During Therapy: WFL for tasks assessed/performed Overall Cognitive Status: No family/caregiver present to determine  baseline cognitive functioning Area of Impairment: Orientation;Memory;Safety/judgement                 Orientation Level: Disoriented to;Time;Situation   Memory: Decreased short-term memory   Safety/Judgement: Decreased awareness of deficits     General Comments: Pt following all 1-2 step  commands with increased time   General Comments  VSS.  BP at EOB with dizzines reported 105/62, 94 BPM    Exercises     Shoulder Instructions      Home Living  Family/patient expects to be discharged to:: Skilled nursing facility                                        Prior Functioning/Environment Level of Independence: Needs assistance  Gait / Transfers Assistance Needed: able to squat and slide transfer at baseline. Pt reports he stands daily holding onto support but does not walk ADL's / Homemaking Assistance Needed: pt reports he does his own bathing and dressing but unsure how accurate that is   Comments: Pt from [facility]        OT Problem List: Decreased activity tolerance;Decreased safety awareness      OT Treatment/Interventions:      OT Goals(Current goals can be found in the care plan section) Acute Rehab OT Goals Patient Stated Goal: return to [place]  OT Frequency:     Barriers to D/C:            Co-evaluation              AM-PAC OT "6 Clicks" Daily Activity     Outcome Measure Help from another person eating meals?: A Little Help from another person taking care of personal grooming?: A Little Help from another person toileting, which includes using toliet, bedpan, or urinal?: A Little Help from another person bathing (including washing, rinsing, drying)?: A Little Help from another person to put on and taking off regular upper body clothing?: A Little Help from another person to put on and taking off regular lower body clothing?: A Little 6 Click Score: 18   End of Session Equipment Utilized During Treatment: Gait belt Nurse Communication: Mobility status  Activity Tolerance: Patient tolerated treatment well Patient left: in bed;with call bell/phone within reach;with bed alarm set  OT Visit Diagnosis: Muscle weakness (generalized) (M62.81);Other symptoms and signs involving cognitive function                Time: 3338-3291 OT Time Calculation (min): 24 min Charges:  OT General Charges $OT Visit: 1 Visit OT Evaluation $OT Eval Moderate Complexity: 1 Mod OT Treatments $Self Care/Home  Management : 8-22 mins  Jefferey Pica, OTR/L Acute Rehabilitation Services Pager: [phone] Office: [phone]   Durant C 07/14/2019, 3:59 PM

## 2019-07-14 NOTE — Progress Notes (Deleted)
SLP Cancellation Note  Patient Details Name: George Burgess MRN: [id number] DOB: 10-06-1950   Cancelled treatment:       Reason Eval/Treat Not Completed: Patient at procedure or test/unavailable(Pt NPO for EGD at 10:30. SLP will f/u on subsequent date. )  Lennyn Bellanca I. Hardin Negus, [place], [place] Office number [phone] Pager [place] 07/14/2019, 10:07 AM

## 2019-07-14 NOTE — Evaluation (Addendum)
Physical Therapy Evaluation Patient Details Name: George Burgess MRN: [id number] DOB: 1950-08-25 Today's Date: 07/14/2019   History of Present Illness  68 yo admitted with chest pain and increased confusion with tachycardia and elevated troponin thought to be related to UTI. PMHx: dementia, MI, left BKA, CVA, AFib, CHF, CAD, ESRD, HLD, HTN  Clinical Impression  Pt pleasant and confused regarding time and situation. Pt from [facility] and non ambulatory at baseline with pt reporting he transfers to [facility] on his own and stands daily. Pt with supervision to minguard for basic transfers and RN declined OOB to chair due to lack of chair alarms on unit at this time. Pt with decreased strength, cognition and balance who will benefit from acute therapy to maximize mobility for return to SNF.     Follow Up Recommendations SNF    Equipment Recommendations  None recommended by PT    Recommendations for Other Services       Precautions / Restrictions Precautions Precautions: Fall Precaution Comments: left BKA, non ambulatory at baseline      Mobility  Bed Mobility Overal bed mobility: Needs Assistance Bed Mobility: Supine to Sit;Sit to Supine     Supine to sit: Supervision Sit to supine: Supervision   General bed mobility comments: cues to initiate with increased time. Transition back to bed due to lack of chair alarm and awaiting HD  Transfers Overall transfer level: Needs assistance   Transfers: Sit to/from [employer];Lateral/Scoot Transfers Sit to Stand: Min guard   Squat pivot transfers: Min guard    Lateral/Scoot Transfers: Supervision General transfer comment: pt able to perform lateral scoot from bed to chair without difficulty. pt then stood with RW with guarding for stability for 30 sec. Pt unable to perform full stand pivot and transitioned to squat pivot with guarding for safety  Ambulation/Gait             General Gait Details: non ambulatory at  baseline  Stairs            Wheelchair Mobility    Modified Rankin (Stroke Patients Only)       Balance Overall balance assessment: Needs assistance   Sitting balance-Leahy Scale: Good     Standing balance support: Bilateral upper extremity supported Standing balance-Leahy Scale: Poor                               Pertinent Vitals/Pain Pain Assessment: No/denies pain    Home Living Family/patient expects to be discharged to:: Skilled nursing facility                      Prior Function Level of Independence: Needs assistance   Gait / Transfers Assistance Needed: able to squat and slide transfer at baseline. Pt reports he stands daily holding onto support but does not walk  ADL's / Homemaking Assistance Needed: pt reports he does his own bathing and dressing but unsure how accurate that is  Comments: Pt [facility]     Hand Dominance        Extremity/Trunk Assessment   Upper Extremity Assessment Upper Extremity Assessment: Generalized weakness    Lower Extremity Assessment Lower Extremity Assessment: Generalized weakness;LLE deficits/detail LLE Deficits / Details: left BKA    Cervical / Trunk Assessment Cervical / Trunk Assessment: Other exceptions Cervical / Trunk Exceptions: rounded shoulders  Communication   Communication: No difficulties  Cognition Arousal/Alertness: Awake/alert Behavior During Therapy: [facility]  for tasks assessed/performed Overall Cognitive Status: No family/caregiver present to determine baseline cognitive functioning Area of Impairment: Orientation;Memory;Safety/judgement                 Orientation Level: Disoriented to;Time;Situation   Memory: Decreased short-term memory   Safety/Judgement: Decreased awareness of deficits     General Comments: pt pleasant and following all single step commands      General Comments      Exercises     Assessment/Plan    PT Assessment Patient needs  continued PT services  PT Problem List Decreased mobility;Decreased cognition;Decreased activity tolerance;Decreased balance       PT Treatment Interventions Therapeutic exercise;Functional mobility training;Therapeutic activities;Patient/family education    PT Goals (Current goals can be found in the Care Plan section)  Acute Rehab PT Goals Patient Stated Goal: return to [facility] PT Goal Formulation: With patient Time For Goal Achievement: 07/28/19 Potential to Achieve Goals: Fair    Frequency Min 2X/week   Barriers to discharge        Co-evaluation               AM-PAC PT "6 Clicks" Mobility  Outcome Measure Help needed turning from your back to your side while in a flat bed without using bedrails?: A Little Help needed moving from lying on your back to sitting on the side of a flat bed without using bedrails?: A Little Help needed moving to and from a bed to a chair (including a wheelchair)?: A Little Help needed standing up from a chair using your arms (e.g., wheelchair or bedside chair)?: A Little Help needed to walk in hospital room?: Total Help needed climbing 3-5 steps with a railing? : Total 6 Click Score: 14    End of Session   Activity Tolerance: Patient tolerated treatment well Patient left: in bed;with bed alarm set;with call bell/phone within reach Nurse Communication: Mobility status PT Visit Diagnosis: Other abnormalities of gait and mobility (R26.89)    Time: 0722-0738 PT Time Calculation (min) (ACUTE ONLY): 16 min   Charges:   PT Evaluation $PT Eval Moderate Complexity: 1 Mod          Ceana Fiala P, PT Acute Rehabilitation Services Pager: [phone] Office: [place] 07/14/2019, 9:37 AM

## 2019-07-14 NOTE — Progress Notes (Signed)
FPTS Interim Progress Note  S: Reviewed patient this evening. Pt orientated to location, name and DOB. When I asked why he came to the hospital he told me because he had a stroke. Denies chest pain currently.   O: BP 113/71 (BP Location: Right Arm)   Pulse (!) 125   Temp 98.6 F (37 C) (Oral)   Resp 19   Ht 6' (1.829 m)   SpO2 99%   BMI 19.40 kg/m    General: sleepy, no acute distress Cardio: Normal S1 and S2, tachycardic  Pulm: CTAB, mildly increased WOB  Extremities: skin is warm to touch, strong radial pulse  A/P: Troponins overnight 257>252>216, tachycardia improving with extra dose of 12.63m Metoprolol and tylenol. Seen by cardiology who feel tachycardia is more so driven by UTI.  -Continue to monitor HR -Monitor for fevers -Increased dose to Metoprolol 50mg  in the morning   Lattie Haw, MD 07/14/2019, 1:35 AM PGY-1, [place] Medicine Service pager [phone]

## 2019-07-14 NOTE — Evaluation (Signed)
Clinical/Bedside Swallow Evaluation Patient Details  Name: George Burgess MRN: [id number] Date of Birth: [birth year]  Today's Date: 07/14/2019 Time: SLP Start Time (ACUTE ONLY): 1009 SLP Stop Time (ACUTE ONLY): 1030 SLP Time Calculation (min) (ACUTE ONLY): 21 min  Past Medical History:  Past Medical History:  Diagnosis Date  . Atrial fibrillation ([place])    a. Chronic Eliquis (CHA2DS2VASc = 6).  . Chronic combined systolic (congestive) and diastolic (congestive) heart failure ([place])    a. Previously reported EF of 30%;  b. 08/2016 Echo: EF 40-45%, antsept, inf, infsept HK, Gr3 DD, mod AI/MR, sev dil LA, mild TR, PASP 40mmHg.  Marland Kitchen Dementia ([place])   . Encephalopathy   . End stage renal disease ([place])    a. On HD.  Marland Kitchen Epilepsy, unspecified, not intractable, without status epilepticus ([place]) 03/21/2018   provided from [facility] records  . Essential hypertension   . GIB (gastrointestinal bleeding)    a. 08/2016 in setting of Eliquis Rx.  Marland Kitchen Hyperlipidemia   . Pleural effusion   . PVD (peripheral vascular disease) ([place])    a. s/p L BKA;  b. Chronic LE wounds.  . Stroke ([place])   . Type II diabetes mellitus ([place])    Past Surgical History:  Past Surgical History:  Procedure Laterality Date  . BELOW KNEE LEG AMPUTATION Left   . CORONARY/GRAFT ACUTE MI REVASCULARIZATION N/A 06/20/2019   Procedure: Coronary/Graft Acute MI Revascularization;  Surgeon: Troy Sine, MD;  Location: [facility];  Service: Cardiovascular;  Laterality: N/A;  . FLEXIBLE SIGMOIDOSCOPY N/A 09/07/2016   Procedure: FLEXIBLE SIGMOIDOSCOPY;  Surgeon: Manus Gunning, MD;  Location: [facility];  Service: Gastroenterology;  Laterality: N/A;  . LEFT HEART CATH AND CORONARY ANGIOGRAPHY N/A 06/20/2019   Procedure: LEFT HEART CATH AND CORONARY ANGIOGRAPHY;  Surgeon: Troy Sine, MD;  Location: [facility];  Service: Cardiovascular;  Laterality: N/A;   HPI:  Pt is a 69 y.o. male who presented with  intermittent chest pain. PMH is significant for recent acute MI (06/20/2019), ESRD, CAD, HLD CHF, previous CVA, atrial fibrillation/flutter, dementia, hypertension. Swallow eval in 2018 WNL. CXR: Cardiomegaly. Lungs clear.    Assessment / Plan / Recommendation Clinical Impression  Pt was seen for bedside swallow evaluation. His reliability as a historian is questioned due to the variability of his reports and his responses being inconsistently related to the question/conversational topic. He reported and then denied coughing with thin liquids at baseline during the evaluation. Oral mechanism exam was limited due to pt's difficulty following commands; however, oral motor strength and ROM appeared grossly WFL. He exhibited coughing with ice chips and with the first two swallows of thin liquids but did not exhibit any subsequent signs of aspiration with solids or liquids or any symptoms of oral dysphagia. It is recommended that his current diet be continued and SLP will follow to assess persistence of symptoms and the need for instrumental assessment.  SLP Visit Diagnosis: Dysphagia, pharyngeal phase (R13.13)    Aspiration Risk  Mild aspiration risk    Diet Recommendation Thin liquid;Regular   Liquid Administration via: Cup;Straw Medication Administration: Whole meds with puree Supervision: Staff to assist with self feeding Compensations: Slow rate;Small sips/bites Postural Changes: Seated upright at 90 degrees    Other  Recommendations Oral Care Recommendations: Oral care BID   Follow up Recommendations None      Frequency and Duration min 2x/week  2 weeks       Prognosis Prognosis for  Safe Diet Advancement: Good Barriers to Reach Goals: Cognitive deficits      Swallow Study   General Date of Onset: 07/13/19 HPI: Pt is a 69 y.o. male who presented with intermittent chest pain. PMH is significant for recent acute MI (06/20/2019), ESRD, CAD, HLD CHF, previous CVA, atrial  fibrillation/flutter, dementia, hypertension. Swallow eval in 2018 WNL. CXR: Cardiomegaly. Lungs clear.  Type of Study: Bedside Swallow Evaluation Previous Swallow Assessment: BSE 2018 WNL Diet Prior to this Study: Regular;Thin liquids Temperature Spikes Noted: No Respiratory Status: Room air History of Recent Intubation: No Behavior/Cognition: Alert;Cooperative;Pleasant mood Oral Cavity Assessment: Within Functional Limits Oral Care Completed by SLP: No Oral Cavity - Dentition: Adequate natural dentition Vision: Functional for self-feeding Self-Feeding Abilities: Able to feed self Patient Positioning: Upright in bed;Postural control adequate for testing Baseline Vocal Quality: Normal Volitional Cough: Strong Volitional Swallow: Able to elicit    Oral/Motor/Sensory Function Overall Oral Motor/Sensory Function: Within functional limits   Ice Chips Ice chips: Impaired Presentation: Spoon Pharyngeal Phase Impairments: Cough - Immediate   Thin Liquid Thin Liquid: Impaired Presentation: Straw Pharyngeal  Phase Impairments: Cough - Immediate;Cough - Delayed Other Comments: Inconsistently with thin via straw    Nectar Thick Nectar Thick Liquid: Within functional limits Presentation: Straw   Honey Thick Honey Thick Liquid: Not tested   Puree Puree: Within functional limits Presentation: Spoon   Solid    Braedyn Kauk I. Hardin Negus, [place], [place] Office number [phone] Pager [phone]  Solid: Within functional limits Presentation: Self Fed      Horton Marshall 07/14/2019,12:09 PM

## 2019-07-14 NOTE — NC FL2 (Addendum)
[place] MEDICAID FL2 LEVEL OF CARE SCREENING TOOL     IDENTIFICATION  Patient Name: George Burgess Birthdate: 1950/11/27 Sex: male Admission Date (Current Location): 07/13/2019  [facility] and [place] Number:  Herbalist and Address:  [facility] [place], [place] [address], [place], [place] [address]      Provider Number: [id number]  Attending Physician Name and Address:  Lind Covert, MD  Relative Name and Phone Number:       Current Level of Care: Hospital Recommended Level of Care: [place] Prior Approval Number:    Date Approved/Denied:   PASRR Number:   [id number]  Discharge Plan: SNF    Current Diagnoses: Patient Active Problem List   Diagnosis Date Noted  . Tachycardia   . Chest pain 07/13/2019  . Epilepsy ([place]) 07/13/2019  . Altered mental status   . Atrial flutter ([place])   . History of ST elevation myocardial infarction (STEMI) 06/20/2019  . Dementia ([place])   . Chronic combined systolic (congestive) and diastolic (congestive) heart failure ([place])   . PVD (peripheral vascular disease) ([place])   . History of stroke   . Seizure ([place]) 09/11/2017  . ESRD on hemodialysis ([place]) 09/11/2017  . Elevated troponin 09/11/2017  . Atrial fibrillation, chronic 09/11/2017  . 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[place]) 09/11/2017  . Essential hypertension 09/11/2017  . Hypoglycemia 09/11/2017  . Syncope 09/10/2017  . Blindness   . Bloody stools   . Encounter for nasogastric (NG) tube placement   . Lower GI bleed   . Malnutrition of moderate degree 09/05/2016  . Acute GI bleeding 09/04/2016    Orientation RESPIRATION BLADDER Height & Weight     Self, Situation, Place  Normal Continent Weight: 153 lb 3.5 oz (69.5 kg) Height:  6' (182.9 cm)  BEHAVIORAL SYMPTOMS/MOOD NEUROLOGICAL BOWEL NUTRITION STATUS      Incontinent Diet(Please see DC Summary)  AMBULATORY STATUS COMMUNICATION OF NEEDS Skin   Extensive Assist Verbally  Normal                       Personal Care Assistance Level of Assistance  Bathing, Feeding, Dressing Bathing Assistance: Maximum assistance Feeding assistance: Limited assistance Dressing Assistance: Limited assistance     Functional Limitations Info  Sight, Hearing, Speech Sight Info: Adequate Hearing Info: Adequate Speech Info: Adequate    SPECIAL CARE FACTORS FREQUENCY  PT (By licensed PT), OT (By licensed OT)     PT Frequency: 3x/week OT Frequency: 2x/week            Contractures Contractures Info: Not present    Additional Factors Info  Code Status, Allergies, Isolation Precautions Code Status Info: Full Allergies Info: NKA     Isolation Precautions Info: MRSA     Current Medications (07/14/2019):  This is the current hospital active medication list Current Facility-Administered Medications  Medication Dose Route Frequency Provider Last Rate Last Admin  . acetaminophen (TYLENOL) tablet 650 mg  650 mg Oral Q4H PRN Brimage, Vondra, DO   650 mg at 07/14/19 0021  . aspirin chewable tablet 81 mg  81 mg Oral Daily Brimage, Vondra, DO   81 mg at 07/14/19 1304  . atorvastatin (LIPITOR) tablet 80 mg  80 mg Oral QHS Brimage, Vondra, DO   80 mg at 07/13/19 2154  . Chlorhexidine Gluconate Cloth 2 % PADS 6 each  6 each Topical Q0600 Brimage, Vondra, DO   6 each at 07/14/19 0647  .  famotidine (PEPCID) tablet 20 mg  20 mg Oral Daily Brimage, Vondra, DO   20 mg at 07/14/19 1303  . feeding supplement (NEPRO CARB STEADY) liquid 237 mL  237 mL Oral BID BM Brimage, Vondra, DO      . heparin injection 5,000 Units  5,000 Units Subcutaneous Q8H Brimage, Vondra, DO   5,000 Units at 07/14/19 0646  . lamoTRIgine (LAMICTAL) tablet 25 mg  25 mg Oral QHS Brimage, Vondra, DO   25 mg at 07/13/19 2203  . levETIRAcetam (KEPPRA) tablet 500 mg  500 mg Oral BID Brimage, Vondra, DO   500 mg at 07/14/19 1304  . meropenem (MERREM) 500 mg in sodium chloride 0.9 % 100 mL IVPB  500 mg Intravenous  Q24H Wilber Oliphant, MD   Stopped at 07/13/19 2308  . metoprolol succinate (TOPROL-XL) 24 hr tablet 25 mg  25 mg Oral Daily Brimage, Vondra, DO   25 mg at 07/14/19 1304  . multivitamin (RENA-VIT) tablet 1 tablet  1 tablet Oral QHS Lind Covert, MD   1 tablet at 07/13/19 2154  . nitroGLYCERIN (NITROSTAT) SL tablet 0.4 mg  0.4 mg Sublingual Q5 Min x 3 PRN Brimage, Vondra, DO      . ondansetron (ZOFRAN) injection 4 mg  4 mg Intravenous Q6H PRN Brimage, Vondra, DO      . sevelamer carbonate (RENVELA) tablet 800 mg  800 mg Oral TID WC Justin Mend, MD      . ticagrelor [facility]) tablet 90 mg  90 mg Oral BID Brimage, Vondra, DO   90 mg at 07/14/19 1304     Discharge Medications: Please see discharge summary for a list of discharge medications.  Relevant Imaging Results:  Relevant Lab Results:   Additional Information SS#: [id number]                    COVID negative on 07/13/19  Benard Halsted, LCSW

## 2019-07-14 NOTE — Progress Notes (Signed)
Family Medicine Teaching Service Daily Progress Note Intern Pager: [phone]  Patient name: George Burgess Medical record number: [id number] Date of birth: Jun 23, 1950 Age: 69 y.o. Gender: male  Primary Care Provider: Caprice Renshaw, MD Consultants: Nephrology, Cardiology  Code Status: FULL  Pt Overview and Major Events to Date:  07/15/19: Admitted, cardiology, nephrology consulted    Assessment and Plan:  UTI Patient complained of dysuria.  Remains afebrile.  Patient had klebsiella and enterobacter clocae cystitis in 2018-that was sensitive to gentamicin, imipenem, macrobid.  UA on admission concerning for infection with many bacteria and large leukocytes seen.  -Meropenem per pharmacy, transition to oral therapy when able -Follow-up urine culture  Chest Pain  ACS Rule Out  CAD  Patient with intermittent chest pain and admitted for ACS rule out.  Sensitivity troponins down trended.  Per cardiology, elevated troponin likely secondary to infectious process.  No ACS is suspected at this time.  Increased metoprolol to 25 mg XL daily. -Cardiology consulted, appreciate recommendations -Continue 81 mg aspirin, Brilinta and metoprolol XL   ESRD  Patient missed dialysis on Monday.  Scheduled MWF.  Last dialysis session on Friday.  Patient to have HD today.  Baseline.BUN 70.  - Nephrology consulted, appreciate recommendations  - HD per nephrology    Prior CVA  Stable.  Patient with out residual motor deficits on Aspirin 81 mg and Brillianta. Possibly continued aphasia?  - Continue home medications   Atrial fibrillation / flutter Home meds include: No anticoagulation as patient has history of GI bleed and is fall risk. Takes metoprolol for rate control.  Follows with cardiology, ?Dr. Claiborne Billings with [facility].  EKG indicated indeterminate wide QRS tachycardia Ventricular premature complex Right bundle branch block. CHADS-VASC score: 7, HASBLED: score 3.   -Increased metoprolol XL to 25  mg  Hyperlipidemia Patient home medication is Lipitor 80mg . Patient with recent MI earlier this month (see above for additional information).  - Continue home Lipitor dose.  Hypertension Blood pressures since admission have ranged between 103/71 - 119/84. Home meds include Metoprolol XL and imdur daily.  Normotensive. -Discontinue Imdur -Increase metoprolol 25 mg XL daily - Monitor blood pressures  Diabetes mellitus Euglycemic.  Patient takes no medication for this.  Appears to be diet controlled. Last A1c was 6.3 on 06/20/19.  - monitor with AM RFP  HFrEF Last echo on 06/21/2019 showed EF 25-30%, LV regional wall motion abnormality, severe LVH, moderately elevated RV systolic pressure. Home meds include Metoprolol XL and imdur daily.  Per cardiology Imdur discontinued. - Cardiology consulted, appreciate recommendations  -Discontinue Imdur -Increase metoprolol 25 mg XL daily  FEN/GI: heart healthy diet, replete electrolytes as needed  Prophylaxis: Heparin   Disposition: Likely SNF   Subjective:  Patient reports continued dysuria this morning.  Denies chest pain or shortness of breath.   Objective: Temp:  [97.5 F (36.4 C)-98.9 F (37.2 C)] 97.5 F (36.4 C) (05/26 0848) Pulse Rate:  [93-129] 93 (05/26 0848) Resp:  [16-29] 19 (05/26 0848) BP: (91-127)/(59-84) 91/60 (05/26 0848) SpO2:  [96 %-100 %] 100 % (05/26 0848) Weight:  [69.5 kg] 69.5 kg (05/26 0501)  Physical Exam: General: Alert, sitting comfortably in bed in no acute distress Cardiovascular: Irregularly regular rhythm, normal rate Respiratory: Clear to auscultation bilaterally, crease work of breathing Abdomen: Soft, nontender nondistended Extremities: Left BKA, right lower extremity nontender, cool to touch,   Laboratory: Recent Labs  Lab 07/13/19 1220 07/14/19 0338  WBC 13.5* 10.6*  HGB 12.6* 10.4*  HCT 38.0* 30.8*  PLT  95* 87*   Recent Labs  Lab 07/13/19 1219 07/13/19 1429 07/14/19 0338  NA  135  --  133*  K 5.7* 4.3 3.7  CL 95*  --  98  CO2 19*  --  19*  BUN 70*  --  79*  CREATININE 11.34*  --  12.40*  CALCIUM 8.8*  --  8.4*  GLUCOSE 106*  --  78     Imaging/Diagnostic Tests: DG Chest Port 1 View  Result Date: 07/13/2019 CLINICAL DATA:  Chest pain EXAM: PORTABLE CHEST 1 VIEW COMPARISON:  Jun 20, 2019 FINDINGS: Lungs are clear. There is cardiomegaly with pulmonary vascularity within normal limits. No evident adenopathy. Mild soft tissue prominence in the superior mediastinum likely represents great vessel prominence. No pneumothorax. No bone lesions. IMPRESSION: Cardiomegaly. Lungs clear. Stable mild prominence in the superior mediastinum. Electronically Signed   By: Lowella Grip III M.D.   On: 07/13/2019 12:53     Lyndee Hensen, DO 07/14/2019, 9:59 AM PGY-1, [place] Intern pager: [phone], text pages welcome

## 2019-07-14 NOTE — Progress Notes (Signed)
Progress Note  Patient Name: George Burgess Date of Encounter: 07/14/2019  Primary Cardiologist: Shelva Majestic, MD   Subjective   Denies any chest pain or dyspnea.   Inpatient Medications    Scheduled Meds: . aspirin  81 mg Oral Daily  . atorvastatin  80 mg Oral QHS  . Chlorhexidine Gluconate Cloth  6 each Topical Q0600  . famotidine  20 mg Oral Daily  . feeding supplement (NEPRO CARB STEADY)  237 mL Oral BID BM  . heparin  5,000 Units Subcutaneous Q8H  . lamoTRIgine  25 mg Oral QHS  . levETIRAcetam  500 mg Oral BID  . metoprolol succinate  25 mg Oral Daily  . multivitamin  1 tablet Oral QHS  . ticagrelor  90 mg Oral BID   Continuous Infusions: . meropenem (MERREM) IV Stopped (07/13/19 2308)   PRN Meds: acetaminophen, nitroGLYCERIN, ondansetron (ZOFRAN) IV   Vital Signs    Vitals:   07/14/19 0500 07/14/19 0501 07/14/19 0600 07/14/19 0848  BP: 92/62 92/62 115/81 91/60  Pulse: 94 93 94 93  Resp: 18 19 (!) 22 19  Temp:  97.7 F (36.5 C)  (!) 97.5 F (36.4 C)  TempSrc:  Oral  Oral  SpO2: 99% 100% 99% 100%  Weight:  69.5 kg    Height:        Intake/Output Summary (Last 24 hours) at 07/14/2019 1050 Last data filed at 07/14/2019 0630 Gross per 24 hour  Intake 340.06 ml  Output 120 ml  Net 220.06 ml   Last 3 Weights 07/14/2019 06/22/2019 06/21/2019  Weight (lbs) 153 lb 3.5 oz 143 lb 1.3 oz 152 lb 1.9 oz  Weight (kg) 69.5 kg 64.9 kg 69 kg      Telemetry    Atrial flutter with controlled rate 80-90. - Personally Reviewed  ECG    Atrial flutter with rate 86.  RBBB. Old anteroseptal MI- Personally Reviewed  Physical Exam   GEN: No acute distress.  Thin and frail Neck: No JVD Cardiac: IRRR, no murmurs, rubs, or gallops.  Respiratory: Clear to auscultation bilaterally. GI: Soft, nontender, non-distended  MS: No edema; No deformity. Neuro:  Nonfocal    Labs    High Sensitivity Troponin:   Recent Labs  Lab 06/20/19 1443 07/13/19 1219 07/13/19 1429  07/13/19 2115 07/13/19 2251  TROPONINIHS 23,179* 257* 252* 216* 239*      Chemistry Recent Labs  Lab 07/13/19 1219 07/13/19 1429 07/14/19 0338  NA 135  --  133*  K 5.7* 4.3 3.7  CL 95*  --  98  CO2 19*  --  19*  GLUCOSE 106*  --  78  BUN 70*  --  79*  CREATININE 11.34*  --  12.40*  CALCIUM 8.8*  --  8.4*  ALBUMIN  --   --  2.6*  GFRNONAA 4*  --  4*  GFRAA 5*  --  4*  ANIONGAP 21*  --  16*     Hematology Recent Labs  Lab 07/13/19 1220 07/14/19 0338  WBC 13.5* 10.6*  RBC 4.20* 3.50*  HGB 12.6* 10.4*  HCT 38.0* 30.8*  MCV 90.5 88.0  MCH 30.0 29.7  MCHC 33.2 33.8  RDW 14.7 14.6  PLT 95* 87*    BNPNo results for input(s): BNP, PROBNP in the last 168 hours.   DDimer No results for input(s): DDIMER in the last 168 hours.   Radiology    DG Chest Port 1 View  Result Date: 07/13/2019 CLINICAL DATA:  Chest pain  EXAM: PORTABLE CHEST 1 VIEW COMPARISON:  Jun 20, 2019 FINDINGS: Lungs are clear. There is cardiomegaly with pulmonary vascularity within normal limits. No evident adenopathy. Mild soft tissue prominence in the superior mediastinum likely represents great vessel prominence. No pneumothorax. No bone lesions. IMPRESSION: Cardiomegaly. Lungs clear. Stable mild prominence in the superior mediastinum. Electronically Signed   By: Lowella Grip III M.D.   On: 07/13/2019 12:53    Cardiac Studies   Echo 06/21/19 1. Left ventricular ejection fraction, by estimation, is 25 to 30%. The  left ventricle has severely decreased function. The left ventricle  demonstrates regional wall motion abnormalities (see scoring  diagram/findings for description). There is severe  left ventricular hypertrophy. Left ventricular diastolic parameters are  indeterminate. There is the interventricular septum is flattened in  systole and diastole, consistent with right ventricular pressure and  volume overload.  2. Right ventricular systolic function is normal. The right ventricular  size  is normal. There is moderately elevated pulmonary artery systolic  pressure. The estimated right ventricular systolic pressure is 73.7 mmHg.  3. Left atrial size was moderately dilated.  4. The mitral valve is normal in structure. Moderate mitral valve  regurgitation. No evidence of mitral stenosis.  5. Tricuspid valve regurgitation is moderate.  6. The aortic valve is tricuspid. Aortic valve regurgitation is trivial.  Mild aortic valve sclerosis is present, with no evidence of aortic valve  stenosis.  7. There is mild dilatation at the level of the sinuses of Valsalva  measuring 40 mm.  8. The inferior vena cava is dilated in size with <50% respiratory  variability, suggesting right atrial pressure of 15 mmHg.   Comparison(s): Prior images reviewed side by side. The left ventricular  function is worsened.   Cardiac cath 06/20/19  1st Mrg lesion is 70% stenosed.  Ramus lesion is 80% stenosed.  RPDA lesion is 80% stenosed.  Mid RCA lesion is 30% stenosed.  Ost LAD to Prox LAD lesion is 40% stenosed.  1st Diag lesion is 70% stenosed.  Prox LAD to Mid LAD lesion is 100% stenosed.  Post intervention, there is a 0% residual stenosis.  A stent was successfully placed.  Total flush occlusion of the proximal LAD with TIMI 0 flow at the site immediately beyond the first proximal diagonal vessel in this patient with delayed presentation and chest pain duration for approximately 2 days.  Multi-vessel CAD with percent smooth ostial narrowing of the LAD followed by total occlusion with TIMI 0 flow mediated beyond a diffusely diseased first diagonal vessel; 80% superior branch stenosis of the ramus intermediate vessel; diffuse irregularity of the circumflex marginal vessels with diffuse 70% stenosis in the OM1 vessel and small distal marginal and distal circumflex vessels with diffuse irregularity; dominant RCA with 30% mid stenosis and 80% mid PDA stenosis. There were faint septal  collaterals to LAD septals but the LAD was not visualized from the RCA injection.  Difficult but successful PCI to the total LAD occlusion with PTCA and ultimate insertion of a 3.0 x 30 mm Resolute DES stent postdilated to 3.29 mm with the under percent occlusion being reduced to 0%. There is no change in the smooth 40% ostial stenosis. The mid distal LAD had 60% mid stenosis followed by 30% distal stenoses with distal irregularity. The vessel wrapped around the apex.  RECOMMENDATION: DAPT for minimum of 1 year. We will continue bivalirudin for several hours post procedure particularly since Brilinta was not administered initially was administered in the latter portion of  the intervention when the patient had awakened. Medical therapy for concomitant CAD. Nephrology will need to be contacted. The patient undergoes hemodialysis on Monday, Wednesday, and Friday. Aggressive lipid-lowering therapy with target LDL less than 70. 2D echo Doppler study will be scheduled.  Coronary Diagrams  Diagnostic Dominance: Right  Intervention      Patient Profile     69 y.o. male with a hx of DM2, HTN, ESRD on HD, prior CVA, mild dementia, afib not on a/c, chronic combined systolic and diastolic CHF (EF 32-02% in 08/2016, recently 25-30% 06/21/19), CAD with recent acute anterior ST elevation MI s/p DES to pLAD who is being seen today for the evaluation of tachycardia at the request of Dr. Erin Hearing.  Assessment & Plan    Chest pain/CAD s/p recent STEMI with DES to pLAD - DAPT with Aspirin and Brilinta for 1 year. Denies missing doses - history unreliable due to confusion - HS troponin 257>252. Trend is flat. Not indicative of ACS - EKG with aflutter with elevated rates - will DC Imdur due to low BP - continue beta blocker.  - no further ischemic work up needed.  Persistent afib/flutter with RVR  - not on a/c due to being poor candidate - was on Toprol-XL 12.5 mg but rate poorly  controlled.  - rates still up in the setting of UTI  - will increase Toprol  to 25 mg. - Continue to monitor on telemetry  chronic combined systolic and diastolic CHF - Echo 04/20/41 with reduced EF of 25-30%, severe LVH, LA moderately dilated, mod MR, mod TR, trivial AI - CXR with no acute process - Volume management per HD - continue BB  - does not appear to be volume overloaded.  ESRD on HD - nephrology plan for HD today - TTS  HTN - Toprol-XL  - pressures are soft so Imdur discontinued  HLD - atorvastatin 80 mg daily - LDL 33   [facility] will sign off.   Medication Recommendations:  As noted above Other recommendations (labs, testing, etc):  none Follow up as an outpatient:  Was scheduled for OV tomorrow. This will need to be rescheduled for [facility] visit once discharged.  For questions or updates, please contact [place] Please consult [email or web address] for contact info under        Signed, Peter Martinique, MD  07/14/2019, 10:50 AM

## 2019-07-14 NOTE — Progress Notes (Signed)
[facility] Progress Note   Subjective:   He has no complaints today except hiccups.  Difficult historian still.   Objective Vitals:   07/14/19 0501 07/14/19 0600 07/14/19 0848 07/14/19 1149  BP: 92/62 115/81 91/60 94/68   Pulse: 93 94 93 97  Resp: 19 (!) 22 19 20   Temp: 97.7 F (36.5 C)  (!) 97.5 F (36.4 C)   TempSrc: Oral  Oral   SpO2: 100% 99% 100% 100%  Weight: 69.5 kg     Height:       Physical Exam General: more alert than yesterday, sitting up in bed looking around Heart: RRR Lungs: normal WOB, clear Abdomen: soft, nontender Extremities: no edema, L BKA, R tibial dressing c/d/i Dialysis Access:  L BC AVF +t/b;hypopigmentation over areas of frequent cannulation, no skin breakdown Neuro:  AOx 2, not able to provide details  Additional Objective Labs: Basic Metabolic Panel: Recent Labs  Lab 07/13/19 1219 07/13/19 1429 07/14/19 0338  NA 135  --  133*  K 5.7* 4.3 3.7  CL 95*  --  98  CO2 19*  --  19*  GLUCOSE 106*  --  78  BUN 70*  --  79*  CREATININE 11.34*  --  12.40*  CALCIUM 8.8*  --  8.4*  PHOS  --   --  6.4*   Liver Function Tests: Recent Labs  Lab 07/14/19 0338  ALBUMIN 2.6*   No results for input(s): LIPASE, AMYLASE in the last 168 hours. CBC: Recent Labs  Lab 07/13/19 1220 07/14/19 0338  WBC 13.5* 10.6*  NEUTROABS 11.5* 9.0*  HGB 12.6* 10.4*  HCT 38.0* 30.8*  MCV 90.5 88.0  PLT 95* 87*   Blood Culture    Component Value Date/Time   SDES URINE, RANDOM 07/13/2019 1447   SPECREQUEST NONE 07/13/2019 1447   CULT (A) 07/13/2019 1447    >=100,000 COLONIES/mL GRAM NEGATIVE RODS CULTURE REINCUBATED FOR BETTER GROWTH SUSCEPTIBILITIES TO FOLLOW Performed at [facility] [address], [place], [place] [address]    REPTSTATUS PENDING 07/13/2019 1447    Cardiac Enzymes: No results for input(s): CKTOTAL, CKMB, CKMBINDEX, TROPONINI in the last 168 hours. CBG: No results for input(s): GLUCAP in the last 168  hours. Iron Studies: No results for input(s): IRON, TIBC, TRANSFERRIN, FERRITIN in the last 72 hours. @lablastinr3 @ Studies/Results: DG Chest Port 1 View  Result Date: 07/13/2019 CLINICAL DATA:  Chest pain EXAM: PORTABLE CHEST 1 VIEW COMPARISON:  Jun 20, 2019 FINDINGS: Lungs are clear. There is cardiomegaly with pulmonary vascularity within normal limits. No evident adenopathy. Mild soft tissue prominence in the superior mediastinum likely represents great vessel prominence. No pneumothorax. No bone lesions. IMPRESSION: Cardiomegaly. Lungs clear. Stable mild prominence in the superior mediastinum. Electronically Signed   By: Lowella Grip III M.D.   On: 07/13/2019 12:53   Medications: . meropenem (MERREM) IV Stopped (07/13/19 2308)   . aspirin  81 mg Oral Daily  . atorvastatin  80 mg Oral QHS  . Chlorhexidine Gluconate Cloth  6 each Topical Q0600  . famotidine  20 mg Oral Daily  . feeding supplement (NEPRO CARB STEADY)  237 mL Oral BID BM  . heparin  5,000 Units Subcutaneous Q8H  . lamoTRIgine  25 mg Oral QHS  . levETIRAcetam  500 mg Oral BID  . metoprolol succinate  25 mg Oral Daily  . multivitamin  1 tablet Oral QHS  . ticagrelor  90 mg Oral BID    Dialysis Orders: Center:[facility]  MWF.  Assessment/Plan  1. Confusion, report of CP:  No e/o MI, cardiology s/o.  +UTI now on treatment.  Mental status about stable c/w yesterday on my exam.  2. ESRD:Missed Monday.  HD today - confirmed on schedule. No heparin with HD.  3. Anemia:Hgb at goal, no ESA indicated at present. 4. Metabolic bone disease:Calcium controlled, phosphorus 6.4.  Start sevelamer 800 TID AC 5. CAD s/p recent PCI:  DAPT x 1 year, no report of missed doses; cardiology eval, no e/o MI. Signed off.  6. Seizure DO 7. UTI: per above, on meropenem with culture pending  Call with issues.   Jannifer Hick MD 07/14/2019, 2:05 PM  [facility] Pager: [phone]

## 2019-07-15 ENCOUNTER — Ambulatory Visit: Payer: Medicare Other | Admitting: Medical

## 2019-07-15 LAB — RENAL FUNCTION PANEL
Albumin: 2.5 g/dL — ABNORMAL LOW (ref 3.5–5.0)
Anion gap: 20 — ABNORMAL HIGH (ref 5–15)
BUN: 99 mg/dL — ABNORMAL HIGH (ref 8–23)
CO2: 16 mmol/L — ABNORMAL LOW (ref 22–32)
Calcium: 8.1 mg/dL — ABNORMAL LOW (ref 8.9–10.3)
Chloride: 95 mmol/L — ABNORMAL LOW (ref 98–111)
Creatinine, Ser: 13.54 mg/dL — ABNORMAL HIGH (ref 0.61–1.24)
GFR calc Af Amer: 4 mL/min — ABNORMAL LOW (ref >60)
GFR calc non Af Amer: 3 mL/min — ABNORMAL LOW (ref >60)
Glucose, Bld: 86 mg/dL (ref 70–99)
Phosphorus: 7.4 mg/dL — ABNORMAL HIGH (ref 2.5–4.6)
Potassium: 3.8 mmol/L (ref 3.5–5.1)
Sodium: 131 mmol/L — ABNORMAL LOW (ref 135–145)

## 2019-07-15 LAB — CBC
HCT: 34.6 % — ABNORMAL LOW (ref 39.0–52.0)
Hemoglobin: 11.9 g/dL — ABNORMAL LOW (ref 13.0–17.0)
MCH: 29.5 pg (ref 26.0–34.0)
MCHC: 34.4 g/dL (ref 30.0–36.0)
MCV: 85.6 fL (ref 80.0–100.0)
Platelets: 101 10*3/uL — ABNORMAL LOW (ref 150–400)
RBC: 4.04 MIL/uL — ABNORMAL LOW (ref 4.22–5.81)
RDW: 14.5 % (ref 11.5–15.5)
WBC: 14.9 10*3/uL — ABNORMAL HIGH (ref 4.0–10.5)
nRBC: 0 % (ref 0.0–0.2)

## 2019-07-15 NOTE — Progress Notes (Signed)
SLP Cancellation Note  Patient Details Name: George Burgess MRN: [id number] DOB: Jul 19, 1950   Cancelled treatment:       Reason Eval/Treat Not Completed: Patient at procedure or test/unavailable(Pt at HD at this time. SLP will follow up )  Sumaya Riedesel I. Hardin Negus, [place], [place] Office number [phone] Pager [place] 07/15/2019, 11:06 AM

## 2019-07-15 NOTE — Progress Notes (Signed)
Pt's BP was 86/63 while heart rate went from mid 110s to 120s bpm.  Pt was alert and asymptomatic. He had gone to dialysis earlier today & received Toprol-XL 25 mg after dialysis at 1713.  Informed on call physician.  The doctor instructed to just monitor the pt and recheck his blood pressure again manually in an hour. Pt currently resting in bed with call bell in reach.  Will continue to monitor.  Lupita Dawn, RN

## 2019-07-15 NOTE — Progress Notes (Signed)
[facility] Progress Note   Subjective:   Seen during dialysis.  Bumped from yesterday due to schedule.  No new issues.  Still poor historian   Objective Vitals:   07/15/19 0902 07/15/19 0930 07/15/19 1000 07/15/19 1030  BP: (!) 92/56 (!) 92/56 (!) 109/52 (!) 105/44  Pulse: 80 (!) 116 (!) 102 (!) 110  Resp: 20 20 20 20   Temp:      TempSrc:      SpO2: 100%     Weight: 67 kg     Height:       Physical Exam General:calm on dialysis Heart: RRR Lungs: normal WOB, clear Abdomen: soft, nontender Extremities: no edema, L BKA, R tibial dressing c/d/i Dialysis Access:  L BC AVF Qb 400 Neuro:  AOx 2, not able to provide details  Additional Objective Labs: Basic Metabolic Panel: Recent Labs  Lab 07/13/19 1219 07/13/19 1219 07/13/19 1429 07/14/19 0338 07/15/19 0933  NA 135  --   --  133* 131*  K 5.7*   < > 4.3 3.7 3.8  CL 95*  --   --  98 95*  CO2 19*  --   --  19* 16*  GLUCOSE 106*  --   --  78 86  BUN 70*  --   --  79* 99*  CREATININE 11.34*  --   --  12.40* 13.54*  CALCIUM 8.8*  --   --  8.4* 8.1*  PHOS  --   --   --  6.4* 7.4*   < > = values in this interval not displayed.   Liver Function Tests: Recent Labs  Lab 07/14/19 0338 07/15/19 0933  ALBUMIN 2.6* 2.5*   No results for input(s): LIPASE, AMYLASE in the last 168 hours. CBC: Recent Labs  Lab 07/13/19 1220 07/14/19 0338 07/15/19 0933  WBC 13.5* 10.6* 14.9*  NEUTROABS 11.5* 9.0*  --   HGB 12.6* 10.4* 11.9*  HCT 38.0* 30.8* 34.6*  MCV 90.5 88.0 85.6  PLT 95* 87* 101*   Blood Culture    Component Value Date/Time   SDES URINE, RANDOM 07/13/2019 1447   SPECREQUEST NONE 07/13/2019 1447   CULT (A) 07/13/2019 1447    >=100,000 COLONIES/mL ENTEROBACTER CLOACAE SUSCEPTIBILITIES TO FOLLOW Performed at [facility] [address], [place], [place] [address]    REPTSTATUS PENDING 07/13/2019 1447    Cardiac Enzymes: No results for input(s): CKTOTAL, CKMB, CKMBINDEX, TROPONINI in  the last 168 hours. CBG: No results for input(s): GLUCAP in the last 168 hours. Iron Studies: No results for input(s): IRON, TIBC, TRANSFERRIN, FERRITIN in the last 72 hours. @lablastinr3 @ Studies/Results: DG Chest Port 1 View  Result Date: 07/13/2019 CLINICAL DATA:  Chest pain EXAM: PORTABLE CHEST 1 VIEW COMPARISON:  Jun 20, 2019 FINDINGS: Lungs are clear. There is cardiomegaly with pulmonary vascularity within normal limits. No evident adenopathy. Mild soft tissue prominence in the superior mediastinum likely represents great vessel prominence. No pneumothorax. No bone lesions. IMPRESSION: Cardiomegaly. Lungs clear. Stable mild prominence in the superior mediastinum. Electronically Signed   By: Lowella Grip III M.D.   On: 07/13/2019 12:53   Medications: . meropenem (MERREM) IV 500 mg (07/14/19 2004)   . aspirin  81 mg Oral Daily  . atorvastatin  80 mg Oral QHS  . Chlorhexidine Gluconate Cloth  6 each Topical Q0600  . Chlorhexidine Gluconate Cloth  6 each Topical Q0600  . famotidine  20 mg Oral Daily  . feeding supplement (NEPRO CARB STEADY)  237  mL Oral BID BM  . heparin  5,000 Units Subcutaneous Q8H  . lamoTRIgine  25 mg Oral QHS  . levETIRAcetam  500 mg Oral BID  . metoprolol succinate  25 mg Oral Daily  . multivitamin  1 tablet Oral QHS  . mupirocin ointment  1 application Nasal BID  . sevelamer carbonate  800 mg Oral TID WC  . ticagrelor  90 mg Oral BID    Dialysis Orders: [facility].  Assessment/Plan  1. Confusion, report of CP:  No e/o MI, cardiology s/o.  +UTI now on treatment.  Mental status about stable c/w yesterday on my exam, improved from initial.  2. ESRD:Missed Monday.  HD today, resume MWF schedule tomorrow. No heparin with HD.  3. Anemia:Hgb at goal, no ESA indicated at present. 4. Metabolic bone disease:Calcium controlled, phosphorus 6.4.  Started sevelamer 800 TID AC 5. CAD s/p recent PCI:  DAPT x 1 year, no report of  missed doses; cardiology eval, no e/o MI. Signed off.  6. Seizure DO 7. UTI: per above, on meropenem with culture/sens pending 8. Metabolic acidosis: bicarb 16 this AM but missed HD and typically ok, will correct with HD.   Call with issues.   Jannifer Hick MD 07/15/2019, 10:50 AM  [facility] Pager: [phone]

## 2019-07-15 NOTE — Progress Notes (Signed)
   07/15/19 2043  Assess: MEWS Score  Temp 98.4 F (36.9 C)  BP (!) 86/63  Pulse Rate (!) 116  ECG Heart Rate (!) 113  Resp 19  Level of Consciousness Alert  SpO2 100 %  O2 Device Room Air  Assess: MEWS Score  MEWS Temp 0  MEWS Systolic 1  MEWS Pulse 2  MEWS RR 0  MEWS LOC 0  MEWS Score 3  MEWS Score Color Yellow  Assess: if the MEWS score is Yellow or Red  Were vital signs taken at a resting state? Yes  Focused Assessment Documented focused assessment  Early Detection of Sepsis Score *See Row Information* High  MEWS guidelines implemented *See Row Information* Yes  Treat  MEWS Interventions Other (Comment) (DR said to just monitor for now & call if symptomatic)  Take Vital Signs  Increase Vital Sign Frequency  Yellow: Q 2hr X 2 then Q 4hr X 2, if remains yellow, continue Q 4hrs  Notify: Provider  Provider [place] Pugh, MD  Date Provider Notified 07/15/19  Time Provider Notified 2100  Notification Type Page  Notification Reason Change in status  Response No new orders  Date of Provider Response 07/15/19  Time of Provider Response 2103 (error)  Document  Progress note created (see row info) Yes

## 2019-07-15 NOTE — Progress Notes (Signed)
Family Medicine Teaching Service Daily Progress Note Intern Pager: [phone]  Patient name: George Burgess Medical record number: [id number] Date of birth: [birth year] Age: 69 y.o. Gender: male  Primary Care Provider: Caprice Renshaw, MD Consultants: Nephrology, Cardiology  Code Status: FULL  Pt Overview and Major Events to Date:  07/15/19: Admitted, cardiology, nephrology consulted   07/15/19: HD  Assessment and Plan:  UTI WBC 14.9 from 10.6 yesterday.  Susceptibilities pending, continue Meropenem. Afebrile. Patient had klebsiella sennsitive to gentamicin, imipenem, macrobid.  -Meropenem per pharmacy, transition to oral therapy when able -Follow-up urine culture  Chest Pain  ACS Rule Out  CAD  Denies CP. Per cardiology, Paitent to continue DAPT for at least 1 year. Continue metoprolol to 25 mg XL daily. -Cardiology consulted, appreciate recommendations -Continue 81 mg aspirin, Brilinta and metoprolol XL   ESRD  Patient missed dialysis on Monday.  Scheduled MWF.  Last dialysis session on Friday.  Patient did not have HD yesterday.  Patient seen in HD today.   Baseline.BUN 70.  - Nephrology consulted, appreciate recommendations  - HD per nephrology    Prior CVA  Stable.  Patient with out residual motor deficits on Aspirin 81 mg and Brillianta. Possibly continued aphasia?  - Continue home medications   Atrial fibrillation / flutter Home meds include: No anticoagulation as patient has history of GI bleed and is fall risk. Takes metoprolol for rate control.  Follows with cardiology, ?Dr. Claiborne Billings with [facility]. CHADS-VASC score: 7, HASBLED: score 3.   -Continue metoprolol XL to 25 mg  Hyperlipidemia Patient home medication is Lipitor 80mg . Patient with recent MI earlier this month (see above for additional information).  - Continue home Lipitor dose.  Hypertension Home meds include Metoprolol XL and imdur daily.  Mildly hypotensive this morning, 105/44.   -Discontinue Imdur  due to BP -Increase metoprolol 25 mg XL daily - Monitor blood pressures  Diabetes mellitus Euglycemic.  Patient takes no medication for this.  Appears to be diet controlled. Last A1c was 6.3 on 06/20/19.  - monitor with AM RFP  HFrEF Last echo on 06/21/2019 showed EF 25-30%, LV regional wall motion abnormality, severe LVH, moderately elevated RV systolic pressure. Home meds include Metoprolol XL and imdur daily.  Per cardiology Imdur discontinued. - Cardiology consulted, appreciate recommendations  -Discontinued Imdur, due to BP -Increase metoprolol 25 mg XL daily  FEN/GI: heart healthy diet, replete electrolytes as needed  Prophylaxis: Heparin   Disposition: Likely SNF   Subjective:  Patient seen in HD.  Continues to have dysuria.    Objective: Temp:  [96.4 F (35.8 C)-98.4 F (36.9 C)] 98.4 F (36.9 C) (05/27 0808) Pulse Rate:  [79-116] 110 (05/27 1030) Resp:  [15-20] 20 (05/27 1030) BP: (86-116)/(44-76) 105/44 (05/27 1030) SpO2:  [94 %-100 %] 100 % (05/27 0902) Weight:  [99 kg] 67 kg (05/27 0902)  Physical Exam: General: seen in HD, alert, sitting comfortably in bed in no acute distress Cardiovascular: Irregularly regular rhythm, normal rate Respiratory: Clear to auscultation bilaterally, no increased work of breathing Abdomen: Soft, nontender nondistended Extremities: Left BKA, right lower extremity nontender SKIN: RLE chronic wound, dressing clean dry and intact    Laboratory: Recent Labs  Lab 07/13/19 1220 07/14/19 0338 07/15/19 0933  WBC 13.5* 10.6* 14.9*  HGB 12.6* 10.4* 11.9*  HCT 38.0* 30.8* 34.6*  PLT 95* 87* 101*   Recent Labs  Lab 07/13/19 1219 07/13/19 1219 07/13/19 1429 07/14/19 0338 07/15/19 0933  NA 135  --   --  133* 131*  K 5.7*   < > 4.3 3.7 3.8  CL 95*  --   --  98 95*  CO2 19*  --   --  19* 16*  BUN 70*  --   --  79* 99*  CREATININE 11.34*  --   --  12.40* 13.54*  CALCIUM 8.8*  --   --  8.4* 8.1*  GLUCOSE 106*  --   --  78 86    < > = values in this interval not displayed.     Imaging/Diagnostic Tests: No results found.   Lyndee Hensen, DO 07/15/2019, 10:37 AM PGY-1, [place] Intern pager: [phone], text pages welcome

## 2019-07-15 NOTE — Progress Notes (Signed)
Mobility Specialist: Progress Note    07/15/19 1413  Mobility  Activity Repositioned in chair (Pt was sat up in bed )  Level of Assistance Maximum assist, patient does 25-49%  Assistive Device None  Mobility Response Tolerated poorly  Mobility performed by Mobility specialist  $Mobility charge 1 Mobility   Pre-Mobility: 96 HR, 98/69 BP, 100% SpO2 During Mobility: 89% SpO2 Post-Mobility: 98 HR, 101/61 BP, 94% SpO2  Pt was unable to perform ambulation. Pt was sat up in bed and c/o dizziness "like the room is spinning" and felt shaky inside. Pt was positioned back in bed with call bell and phone within reach.   [facility] Rube Sanchez Mobility Specialist

## 2019-07-15 NOTE — Progress Notes (Signed)
  Speech Language Pathology Treatment: Dysphagia  Patient Details Name: George Burgess MRN: [id number] DOB: 1950-11-09 Today's Date: 07/15/2019 Time: 8676-1950 SLP Time Calculation (min) (ACUTE ONLY): 14 min  Assessment / Plan / Recommendation Clinical Impression  Pt was seen for dysphagia treatment and was cooperative during the session. Pt reported that he has been consuming the current diet without difficulty. Pt had the same nurse as yesterday and she stated that the pt has consumed all meals since the evaluation yesterday without overt s/sx of aspiration. No symptoms of oropharyngeal dysphagia were noted with regular texture solids, mixed consistency boluses (i.e, dysphagia 2 with thin liquids) and thin liquids via straw. It is recommended that the pt's current diet be continued and further skilled SLP services do not appear to be clinically indicated at this time.   HPI HPI: Pt is a 69 y.o. male who presented with intermittent chest pain. PMH is significant for recent acute MI (06/20/2019), ESRD, CAD, HLD CHF, previous CVA, atrial fibrillation/flutter, dementia, hypertension. Swallow eval in 2018 WNL. CXR: Cardiomegaly. Lungs clear.       SLP Plan  All goals met;Discharge SLP treatment due to (comment)       Recommendations  Diet recommendations: Regular;Thin liquid Liquids provided via: Straw Medication Administration: Whole meds with puree Supervision: Staff to assist with self feeding Compensations: Slow rate;Small sips/bites                Oral Care Recommendations: Oral care BID Follow up Recommendations: None SLP Visit Diagnosis: Dysphagia, unspecified (R13.10) Plan: All goals met;Discharge SLP treatment due to (comment)       [name] I. Hardin Negus, [place], [place] Office number [phone] Pager [place] 07/15/2019, 4:44 PM

## 2019-07-15 NOTE — Progress Notes (Signed)
   07/15/19 2227  Assess: MEWS Score  BP (!) 74/50  ECG Heart Rate (!) 101  Resp (!) 23  Assess: MEWS Score  MEWS Temp 0  MEWS Systolic 2  MEWS Pulse 1  MEWS RR 1  MEWS LOC 0  MEWS Score 4  MEWS Score Color Red  Assess: if the MEWS score is Yellow or Red  Were vital signs taken at a resting state? Yes  Focused Assessment Documented focused assessment  Early Detection of Sepsis Score *See Row Information* High  MEWS guidelines implemented *See Row Information* No, vital signs rechecked  Notify: Provider  Provider Name/Title Posey Pronto, MD  Date Provider Notified 07/15/19  Time Provider Notified 2240  Notification Type Page  Notification Reason Other (Comment) (updating on vital signs)  Response No new orders  Date of Provider Response 07/15/19  Time of Provider Response 2241  Document  Patient Outcome Other (Comment) (Pt Stable & continuing to monitor)  Progress note created (see row info) Yes

## 2019-07-16 DIAGNOSIS — N390 Urinary tract infection, site not specified: Secondary | ICD-10-CM

## 2019-07-16 LAB — CBC
HCT: 33 % — ABNORMAL LOW (ref 39.0–52.0)
Hemoglobin: 11.5 g/dL — ABNORMAL LOW (ref 13.0–17.0)
MCH: 29.8 pg (ref 26.0–34.0)
MCHC: 34.8 g/dL (ref 30.0–36.0)
MCV: 85.5 fL (ref 80.0–100.0)
Platelets: 102 10*3/uL — ABNORMAL LOW (ref 150–400)
RBC: 3.86 MIL/uL — ABNORMAL LOW (ref 4.22–5.81)
RDW: 14.6 % (ref 11.5–15.5)
WBC: 14.6 10*3/uL — ABNORMAL HIGH (ref 4.0–10.5)
nRBC: 0 % (ref 0.0–0.2)

## 2019-07-16 LAB — RENAL FUNCTION PANEL
Albumin: 2.3 g/dL — ABNORMAL LOW (ref 3.5–5.0)
Anion gap: 14 (ref 5–15)
BUN: 50 mg/dL — ABNORMAL HIGH (ref 8–23)
CO2: 23 mmol/L (ref 22–32)
Calcium: 7.9 mg/dL — ABNORMAL LOW (ref 8.9–10.3)
Chloride: 96 mmol/L — ABNORMAL LOW (ref 98–111)
Creatinine, Ser: 8.65 mg/dL — ABNORMAL HIGH (ref 0.61–1.24)
GFR calc Af Amer: 7 mL/min — ABNORMAL LOW (ref >60)
GFR calc non Af Amer: 6 mL/min — ABNORMAL LOW (ref >60)
Glucose, Bld: 112 mg/dL — ABNORMAL HIGH (ref 70–99)
Phosphorus: 4.2 mg/dL (ref 2.5–4.6)
Potassium: 3.6 mmol/L (ref 3.5–5.1)
Sodium: 133 mmol/L — ABNORMAL LOW (ref 135–145)

## 2019-07-16 MED ORDER — ALBUMIN HUMAN 25 % IV SOLN
INTRAVENOUS | Status: AC
  Administered 2019-07-16: 25 g via INTRAVENOUS
  Filled 2019-07-16: qty 100

## 2019-07-16 MED ORDER — FOSFOMYCIN TROMETHAMINE 3 G PO PACK
3.0000 g | PACK | Freq: Once | ORAL | Status: AC
  Administered 2019-07-16: 3 g via ORAL
  Filled 2019-07-16: qty 3

## 2019-07-16 MED ORDER — GUAIFENESIN-DM 100-10 MG/5ML PO SYRP
5.0000 mL | ORAL_SOLUTION | ORAL | Status: DC | PRN
  Administered 2019-07-16: 5 mL via ORAL
  Filled 2019-07-16: qty 5

## 2019-07-16 MED ORDER — ALBUMIN HUMAN 25 % IV SOLN: 25.0000 g | Freq: Once | INTRAVENOUS | Status: AC

## 2019-07-16 NOTE — Progress Notes (Signed)
Pt. received back to unit from Dialysis. BP 101/62 (75) HR 95 RR  17 SPO2 98. Will continue to monitor    Phoebe Sharps, RN

## 2019-07-16 NOTE — Progress Notes (Signed)
PT Cancellation Note  Patient Details Name: George Burgess MRN: [id number] DOB: 1950/08/18   Cancelled Treatment:    Reason Eval/Treat Not Completed: Patient not medically ready.  Pt's temps are low, hasn't eaten and feels "puny".  Will try back as able. 07/16/2019  Ginger Carne., PT Acute Rehabilitation Services [phone]  (pager) [phone]  (office)   [name] 07/16/2019, 1:04 PM

## 2019-07-16 NOTE — TOC Progression Note (Signed)
Transition of Care [facility]) - Progression Note    Patient Details  Name: George Burgess MRN: [id number] Date of Birth: 1950/06/04  Transition of Care [facility]) CM/SW [place], [place] Phone Number: 07/16/2019, 5:29 PM  Clinical Narrative:     CSW spoke with patient's brother. He confirmed patient is from [facility]. He states he was not aware patient was in the hospital. CSW apologized that he was not notified by SNF that patient was in th hospital. Patient brother confirmed the plan is for the patient to return back to [facility]/SNF. CSW advised possible discharge tomorrow.   RN updated and covid test requested.  Thurmond Butts, MSW, [place] Clinical Social Worker   Expected Discharge Plan: [place]    Expected Discharge Plan and Services Expected Discharge Plan: [place] In-house Referral: Clinical Social Work     Living arrangements for the past 2 months: [place]                                       Social Determinants of Health (SDOH) Interventions    Readmission Risk Interventions No flowsheet data found.

## 2019-07-16 NOTE — Progress Notes (Signed)
Mobility Specialist - Progress Note   07/16/19 1527  Mobility  Activity Stood at bedside  Level of Assistance +2 (takes two people)  Assistive Device None  Mobility Response Tolerated fair  Mobility performed by Mobility specialist  $Mobility charge 1 Mobility    Pre-mobility: 127 HR, 105/65 BP, 99% SpO2 Post-mobility: 128 HR, 106/70 BP, 98% SPO2  Pt was sat up on the edge of the bed for ~30 sec, he then wanted to try standing up on the edge of the bed w/o the walker, my partner and I both lifted him up and he used [place] for support. He began feeling lightheaded 10 seconds in so we sat him back down. After laying down, he said his sxs improved. Pt said he has been feeling unwell in general. He was left lying in his bed oriented to his call bell and phone, the bed alarm was on.   [place] Specialist

## 2019-07-16 NOTE — Social Work (Signed)
CSW called patient's brother,Wilbur, left voice message to return call.  CSW unable to complete assessment.  Thurmond Butts, MSW, [place] Clinical Social Worker

## 2019-07-16 NOTE — Progress Notes (Signed)
   07/16/19 0045  Assess: MEWS Score  Temp 98.1 F (36.7 C)  BP (!) 73/51  Pulse Rate 85  ECG Heart Rate 86  Resp 20  Level of Consciousness Alert  SpO2 96 %  O2 Device Room Air  Assess: MEWS Score  MEWS Temp 0  MEWS Systolic 2  MEWS Pulse 0  MEWS RR 0  MEWS LOC 0  MEWS Score 2  MEWS Score Color Yellow  Assess: if the MEWS score is Yellow or Red  Were vital signs taken at a resting state? Yes  Focused Assessment Documented focused assessment  Early Detection of Sepsis Score *See Row Information* Medium  MEWS guidelines implemented *See Row Information* No, previously yellow, continue vital signs every 4 hours  Document  Progress note created (see row info) Yes

## 2019-07-16 NOTE — Progress Notes (Signed)
Pt had four loose bowel movements tonight that had a very strong smell to it.  Informed on call physician and asked if pt should be put on enteric precautions and tested for C. Diff.  The DR was able to order the enteric precautions but not the order for a stool sample.  Will let day shift decide.  Will continue to monitor.  Lupita Dawn, RN

## 2019-07-16 NOTE — Progress Notes (Signed)
[facility] Progress Note   Subjective:   Patient seen on dialysis and had no complaints.  Specifically denies fevers or chills or chest pain or shortness of breath  Objective Vitals:   07/16/19 1100 07/16/19 1122 07/16/19 1128 07/16/19 1216  BP: (!) 106/48 115/66 102/60   Pulse: 85 90 85 96  Resp:   16   Temp:   (!) 97.3 F (36.3 C) 97.6 F (36.4 C)  TempSrc:   Oral Oral  SpO2:   92%   Weight:   65.4 kg   Height:       Physical Exam General:calm on dialysis Heart: Normal rate Lungs: normal WOB, bilateral chest rise Abdomen: soft, nontender Extremities: Trace lower extremity edema on the right, L BKA, R tibial dressing c/d/i Dialysis Access:  L BC AVF Neuro:  AOx 2, not able to provide details  Additional Objective Labs: Basic Metabolic Panel: Recent Labs  Lab 07/14/19 0338 07/15/19 0933 07/16/19 0235  NA 133* 131* 133*  K 3.7 3.8 3.6  CL 98 95* 96*  CO2 19* 16* 23  GLUCOSE 78 86 112*  BUN 79* 99* 50*  CREATININE 12.40* 13.54* 8.65*  CALCIUM 8.4* 8.1* 7.9*  PHOS 6.4* 7.4* 4.2   Liver Function Tests: Recent Labs  Lab 07/14/19 0338 07/15/19 0933 07/16/19 0235  ALBUMIN 2.6* 2.5* 2.3*   No results for input(s): LIPASE, AMYLASE in the last 168 hours. CBC: Recent Labs  Lab 07/13/19 1220 07/13/19 1220 07/14/19 0338 07/15/19 0933 07/16/19 0235  WBC 13.5*   < > 10.6* 14.9* 14.6*  NEUTROABS 11.5*  --  9.0*  --   --   HGB 12.6*   < > 10.4* 11.9* 11.5*  HCT 38.0*   < > 30.8* 34.6* 33.0*  MCV 90.5  --  88.0 85.6 85.5  PLT 95*   < > 87* 101* 102*   < > = values in this interval not displayed.   Blood Culture    Component Value Date/Time   SDES URINE, RANDOM 07/13/2019 1447   SPECREQUEST NONE 07/13/2019 1447   CULT (A) 07/13/2019 1447    >=100,000 COLONIES/mL ENTEROBACTER SPECIES 40,000 COLONIES/mL STAPHYLOCOCCUS AUREUS SUSCEPTIBILITIES TO FOLLOW Performed at [facility], [place] [address] [place] [place], [place] [address]    REPTSTATUS PENDING 07/13/2019 1447    Cardiac Enzymes: No results for input(s): CKTOTAL, CKMB, CKMBINDEX, TROPONINI in the last 168 hours. CBG: No results for input(s): GLUCAP in the last 168 hours. Iron Studies: No results for input(s): IRON, TIBC, TRANSFERRIN, FERRITIN in the last 72 hours.  Studies/Results: No results found. Medications:  . aspirin  81 mg Oral Daily  . atorvastatin  80 mg Oral QHS  . Chlorhexidine Gluconate Cloth  6 each Topical Q0600  . Chlorhexidine Gluconate Cloth  6 each Topical Q0600  . famotidine  20 mg Oral Daily  . feeding supplement (NEPRO CARB STEADY)  237 mL Oral BID BM  . heparin  5,000 Units Subcutaneous Q8H  . lamoTRIgine  25 mg Oral QHS  . levETIRAcetam  500 mg Oral BID  . metoprolol succinate  25 mg Oral Daily  . multivitamin  1 tablet Oral QHS  . mupirocin ointment  1 application Nasal BID  . sevelamer carbonate  800 mg Oral TID WC  . ticagrelor  90 mg Oral BID    [facility]  Assessment/Plan  1. Confusion, report of CP:  No e/o MI, cardiology s/o.  +UTI now on treatment.  Seems  of mental status is improved from initial presentation 2. ESRD:Missed Monday.  HD yesterday on resuming MWF schedule 3. Anemia:Hgb at goal, no ESA indicated at present. 4. Metabolic bone disease:Calcium controlled, phosphorus significantly improved to 4.2.  Continue sevelamer as prescribed. 5. CAD s/p recent PCI:  DAPT x 1 year, no report of missed doses; cardiology eval, no e/o MI. Signed off.  6. Seizure DO 7. UTI: Status post meropenem and fosfomycin per primary team 8. Metabolic acidosis: Resolved with hemodialysis  Call with issues.

## 2019-07-16 NOTE — Progress Notes (Addendum)
Family Medicine Teaching Service Daily Progress Note Intern Pager: [phone]  Patient name: George Burgess Medical record number: [id number] Date of birth: 04/06/1950 Age: 69 y.o. Gender: male  Primary Care Provider: Caprice Renshaw, MD Consultants: Nephrology, Cardiology  Code Status: FULL  Pt Overview and Major Events to Date:  07/15/19: Admitted, cardiology, nephrology consulted   07/15/19: HD  Assessment and Plan:  UTI WBC 14.9 from 10.6 yesterday.  Susceptibilities pending, continue Meropenem. Afebrile. Patient had klebsiella sennsitive to gentamicin, imipenem, macrobid.  -Meropenem (5/26- 5/28) - Switch to fosfomycin (5/28)  as sensitive and renally safer option  -Follow-up urine culture  Diarrhea  Patient had episode of diarrhea last night.  Reported "insides run out of me" on admission.  He was likely referring to diarrhea and not urinary incontinence. States that has been going on for a while now.  Resides in a SNF.  - Obtain C. Diff testing   Chest Pain  ACS Rule Out  CAD  Denies CP. Per cardiology, Paitent to continue DAPT for at least 1 year. Continue metoprolol to 25 mg XL daily. -Cardiology consulted, appreciate recommendations -Continue 81 mg aspirin, Brilinta and metoprolol XL   ESRD  Patient missed dialysis on Monday.  Scheduled MWF.  Last dialysis session on Friday.  Patient did not have HD yesterday.  Patient seen in HD today.   Baseline.BUN 70.  - Nephrology consulted, appreciate recommendations  - HD per nephrology    Prior CVA  Stable.  Patient with out residual motor deficits on Aspirin 81 mg and Brillianta. Possibly continued aphasia?  - Continue home medications   Atrial fibrillation / flutter Home meds include: No anticoagulation as patient has history of GI bleed and is fall risk. Takes metoprolol for rate control.  Follows with cardiology, ?Dr. Claiborne Billings with [facility]. CHADS-VASC score: 7, HASBLED: score 3.   -Continue metoprolol XL to 25  mg -Consider increasing to 37.5 mg Metoprolol XL  Hyperlipidemia Patient home medication is Lipitor 80mg . Patient with recent MI earlier this month (see above for additional information).  - Continue home Lipitor dose.  Hypertension Home meds include Metoprolol XL and imdur daily.  Mildly hypotensive this morning, 105/44.   -Discontinue Imdur due to BP -Increase metoprolol 25 mg XL daily - Monitor blood pressures  Diabetes mellitus Euglycemic.  Patient takes no medication for this.  Appears to be diet controlled. Last A1c was 6.3 on 06/20/19.  - monitor with AM RFP  HFrEF Last echo on 06/21/2019 showed EF 25-30%, LV regional wall motion abnormality, severe LVH, moderately elevated RV systolic pressure. Home meds include Metoprolol XL and imdur daily.  Per cardiology Imdur discontinued. - Cardiology consulted, appreciate recommendations  -Discontinued Imdur, due to BP -Increase metoprolol 25 mg XL daily  FEN/GI: heart healthy diet, replete electrolytes as needed  Prophylaxis: Heparin   Disposition: Likely SNF   Subjective:  Patient seen in HD.  Continues to have dysuria.  Reports " insides are not of him" occasionally.  Had diarrhea overnight.    Objective: Temp:  [97.5 F (36.4 C)-98.6 F (37 C)] 97.9 F (36.6 C) (05/28 0728) Pulse Rate:  [85-129] 94 (05/28 0930) Resp:  [13-23] 19 (05/28 0728) BP: (73-109)/(44-68) 101/57 (05/28 0930) SpO2:  [95 %-100 %] 100 % (05/28 0728) Weight:  [66.1 kg-66.2 kg] 66.1 kg (05/28 0728)  Physical Exam: General: Alert, resting comfortably in HD bed, no acute distress  cardiovascular: Regular rate and rhythm Respiratory: Lungs clear, no increased work of breathing Abdomen: Scaphoid, soft, nontender Extremities: Left  BKA, right lower extremity nontender no edema, chronic leg wound present, dressing clean dry intact    Laboratory: Recent Labs  Lab 07/14/19 0338 07/15/19 0933 07/16/19 0235  WBC 10.6* 14.9* 14.6*  HGB 10.4* 11.9*  11.5*  HCT 30.8* 34.6* 33.0*  PLT 87* 101* 102*   Recent Labs  Lab 07/14/19 0338 07/15/19 0933 07/16/19 0235  NA 133* 131* 133*  K 3.7 3.8 3.6  CL 98 95* 96*  CO2 19* 16* 23  BUN 79* 99* 50*  CREATININE 12.40* 13.54* 8.65*  CALCIUM 8.4* 8.1* 7.9*  GLUCOSE 78 86 112*     Imaging/Diagnostic Tests: No results found.   Lyndee Hensen, DO 07/16/2019, 9:51 AM PGY-1, [place] Intern pager: [phone], text pages welcome

## 2019-07-17 DIAGNOSIS — I2510 Atherosclerotic heart disease of native coronary artery without angina pectoris: Secondary | ICD-10-CM

## 2019-07-17 LAB — CBC WITH DIFFERENTIAL/PLATELET
Abs Immature Granulocytes: 0.13 10*3/uL — ABNORMAL HIGH (ref 0.00–0.07)
Basophils Absolute: 0 10*3/uL (ref 0.0–0.1)
Basophils Relative: 0 %
Eosinophils Absolute: 0.1 10*3/uL (ref 0.0–0.5)
Eosinophils Relative: 1 %
HCT: 32.9 % — ABNORMAL LOW (ref 39.0–52.0)
Hemoglobin: 11.1 g/dL — ABNORMAL LOW (ref 13.0–17.0)
Immature Granulocytes: 1 %
Lymphocytes Relative: 5 %
Lymphs Abs: 0.7 10*3/uL (ref 0.7–4.0)
MCH: 29.2 pg (ref 26.0–34.0)
MCHC: 33.7 g/dL (ref 30.0–36.0)
MCV: 86.6 fL (ref 80.0–100.0)
Monocytes Absolute: 1.3 10*3/uL — ABNORMAL HIGH (ref 0.1–1.0)
Monocytes Relative: 10 %
Neutro Abs: 11.3 10*3/uL — ABNORMAL HIGH (ref 1.7–7.7)
Neutrophils Relative %: 83 %
Platelets: 105 10*3/uL — ABNORMAL LOW (ref 150–400)
RBC: 3.8 MIL/uL — ABNORMAL LOW (ref 4.22–5.81)
RDW: 14.7 % (ref 11.5–15.5)
WBC: 13.6 10*3/uL — ABNORMAL HIGH (ref 4.0–10.5)
nRBC: 0 % (ref 0.0–0.2)

## 2019-07-17 LAB — URINE CULTURE: Culture: >=100000 — AB

## 2019-07-17 LAB — RENAL FUNCTION PANEL
Albumin: 2.6 g/dL — ABNORMAL LOW (ref 3.5–5.0)
Anion gap: 11 (ref 5–15)
BUN: 29 mg/dL — ABNORMAL HIGH (ref 8–23)
CO2: 26 mmol/L (ref 22–32)
Calcium: 8.2 mg/dL — ABNORMAL LOW (ref 8.9–10.3)
Chloride: 98 mmol/L (ref 98–111)
Creatinine, Ser: 6.56 mg/dL — ABNORMAL HIGH (ref 0.61–1.24)
GFR calc Af Amer: 9 mL/min — ABNORMAL LOW (ref >60)
GFR calc non Af Amer: 8 mL/min — ABNORMAL LOW (ref >60)
Glucose, Bld: 136 mg/dL — ABNORMAL HIGH (ref 70–99)
Phosphorus: 2.9 mg/dL (ref 2.5–4.6)
Potassium: 3.7 mmol/L (ref 3.5–5.1)
Sodium: 135 mmol/L (ref 135–145)

## 2019-07-17 LAB — SARS CORONAVIRUS 2 (TAT 6-24 HRS): SARS Coronavirus 2: NEGATIVE

## 2019-07-17 MED ORDER — MIDODRINE HCL 5 MG PO TABS
2.5000 mg | ORAL_TABLET | Freq: Three times a day (TID) | ORAL | Status: DC
  Administered 2019-07-17: 2.5 mg via ORAL
  Filled 2019-07-17: qty 1

## 2019-07-17 MED ORDER — MIDODRINE HCL 2.5 MG PO TABS: 2.5000 mg | ORAL_TABLET | Freq: Three times a day (TID) | ORAL | 0 refills | Status: AC | PRN

## 2019-07-17 MED ORDER — METOPROLOL SUCCINATE ER 25 MG PO TB24: 25.0000 mg | ORAL_TABLET | Freq: Every day | ORAL | 0 refills | Status: DC

## 2019-07-17 MED ORDER — MIDODRINE HCL 5 MG PO TABS: 2.5000 mg | ORAL_TABLET | Freq: Three times a day (TID) | ORAL | Status: DC

## 2019-07-17 NOTE — Progress Notes (Signed)
[facility] Progress Note   Subjective:   Patient without any complaints today.  Planning for discharge.  Will undergo dialysis in the outpatient setting.  Objective Vitals:   07/17/19 1000 07/17/19 1030 07/17/19 1111 07/17/19 1135  BP:   96/67   Pulse: (!) 107 (!) 103 (!) 104 96  Resp: 15 19 20    Temp:   97.6 F (36.4 C)   TempSrc:   Oral   SpO2:   100%   Weight:      Height:       Physical Exam General: Lying in bed, no distress Heart: Tachycardia Lungs: normal WOB, bilateral chest rise Abdomen: soft, nontender Extremities: Trace lower extremity edema on the right, L BKA, R tibial dressing c/d/i Dialysis Access:  L BC AVF with some swelling on the left forearm  Additional Objective Labs: Basic Metabolic Panel: Recent Labs  Lab 07/15/19 0933 07/16/19 0235 07/17/19 0355  NA 131* 133* 135  K 3.8 3.6 3.7  CL 95* 96* 98  CO2 16* 23 26  GLUCOSE 86 112* 136*  BUN 99* 50* 29*  CREATININE 13.54* 8.65* 6.56*  CALCIUM 8.1* 7.9* 8.2*  PHOS 7.4* 4.2 2.9   Liver Function Tests: Recent Labs  Lab 07/15/19 0933 07/16/19 0235 07/17/19 0355  ALBUMIN 2.5* 2.3* 2.6*   No results for input(s): LIPASE, AMYLASE in the last 168 hours. CBC: Recent Labs  Lab 07/13/19 1220 07/13/19 1220 07/14/19 0338 07/14/19 0338 07/15/19 0933 07/16/19 0235 07/17/19 0355  WBC 13.5*   < > 10.6*   < > 14.9* 14.6* 13.6*  NEUTROABS 11.5*  --  9.0*  --   --   --  11.3*  HGB 12.6*   < > 10.4*   < > 11.9* 11.5* 11.1*  HCT 38.0*   < > 30.8*   < > 34.6* 33.0* 32.9*  MCV 90.5  --  88.0  --  85.6 85.5 86.6  PLT 95*   < > 87*   < > 101* 102* 105*   < > = values in this interval not displayed.   Blood Culture    Component Value Date/Time   SDES URINE, RANDOM 07/13/2019 1447   SPECREQUEST NONE 07/13/2019 1447   CULT (A) 07/13/2019 1447    >=100,000 COLONIES/mL ENTEROBACTER SPECIES 40,000 COLONIES/mL STAPHYLOCOCCUS AUREUS SUSCEPTIBILITIES TO FOLLOW Performed at [facility], [place] [address] [place], [place] [address]    REPTSTATUS PENDING 07/13/2019 1447    Cardiac Enzymes: No results for input(s): CKTOTAL, CKMB, CKMBINDEX, TROPONINI in the last 168 hours. CBG: No results for input(s): GLUCAP in the last 168 hours. Iron Studies: No results for input(s): IRON, TIBC, TRANSFERRIN, FERRITIN in the last 72 hours.  Studies/Results: No results found. Medications:  . aspirin  81 mg Oral Daily  . atorvastatin  80 mg Oral QHS  . Chlorhexidine Gluconate Cloth  6 each Topical Q0600  . Chlorhexidine Gluconate Cloth  6 each Topical Q0600  . famotidine  20 mg Oral Daily  . feeding supplement (NEPRO CARB STEADY)  237 mL Oral BID BM  . heparin  5,000 Units Subcutaneous Q8H  . lamoTRIgine  25 mg Oral QHS  . levETIRAcetam  500 mg Oral BID  . metoprolol succinate  25 mg Oral Daily  . midodrine  2.5 mg Oral TID WC  . multivitamin  1 tablet Oral QHS  . mupirocin ointment  1 application Nasal BID  . sevelamer carbonate  800 mg Oral TID WC  . ticagrelor  90 mg Oral BID    [facility]  Assessment/Plan  1. Confusion, report of CP:  No e/o MI, cardiology s/o.  Mental status has improved from admission with treatment of UTI 2. ESRD:Missed Monday but now resume a Monday Wednesday Friday schedule.  We will continue dialysis in the outpatient setting 3. Anemia:Hgb at goal, no ESA indicated at present. 4. Metabolic bone disease:Calcium controlled, previously hyperphosphatemia now improved.  Continue sevelamer as prescribed. 5. CAD s/p recent PCI:  DAPT x 1 year, no report of missed doses; cardiology eval, no e/o MI. Signed off.  6. Seizure DO 7. UTI: Status post imipenem and fosfomycin per primary team 8. Metabolic acidosis: Resolved with hemodialysis  Call with issues.

## 2019-07-17 NOTE — Discharge Summary (Deleted)
[facility] Discharge Summary  Patient name: George Burgess Medical record number: [id number] Date of birth: 21-Oct-1950 Age: 69 y.o. Gender: male Date of Admission: 07/13/2019  Date of Discharge: today  Admitting Physician: Lyndee Hensen, DO  Primary Care Provider: Caprice Renshaw, MD Consultants: Cardiology, Nephrology   Indication for Hospitalization: ACS rule out, UTI   Discharge Diagnoses/Problem List:  UTI  Recent NSTEMI CAD Atrial fibrillation / flutter  Combined systolic and diastolic congestive heart failure End-stage renal disease  Hyperlipidemia    Disposition: SNF  Discharge Condition: Improved, stable  Discharge Exam:  General: alert, comfortable in bed in no acute distress Cardiovascular: Irregularly regular rhythm, tachycardic  Respiratory: Clear to auscultation bilaterally, no increased work of breathing Abdomen: Soft, nontender nondistended Extremities: Left BKA, normal ROM of right lower extremity which is without edema  Skin: RLE chronic wound, dressing clean dry and intact    Brief Hospital Course:   George Burgess is a 69 y.o. male presenting with intermittent chest pain. PMH is significant for recent acute MI (06/20/2019), ESRD, CAD, HLD CHF, previous CVA, atrial fibrillation/flutter, dementia, hypertension  UTI Patient complained of dysuria on admission.  UA was concerning for infection.  Patient had leukocytosis, WBC 14.9 max.  Patient remained afebrile.  Patient was empirically started on meropenem as previous cultures were positive for Enterobacter and Klebsiella with resistance to ceftriaxone. Urine culture returned positive for Enterobacter and susceptibilities sensitive to fosfomycin.  Patient received 3 days of imipenem and transitioned to fosfomycin on 07/16/19.  Leukocytosis was downtrending prior to discharge.  Patient reported dysuria was improving on the day of discharge.   Atrial fibrillation/ flutter Patient  initially tachycardic 130s.  Initial EKG with wide-complex tachycardia.  Cardiology consulted and recommended increasing metoprolol to 85 mg XL daily.  Imdur was discontinued as patient was mildly hypotensive.  Patient to follow-up with cardiology in early June.    ACS Rule Out CAD HFrEF Patient with recent NSTEMI earlier this month.  Initial troponin elevated to 257.  Cardiology consulted and felt elevated troponin secondary to infection.  Karenz trended flat.  ACS was less likely.  Per cardiology, patient to continue DAPT therapy for for least one year.  He is stable his current regimen, including Metoprolol 25 mg XL QD.   Hypotension Patient intermittently hypotensive.  Imdur was discontinued by cardiology.  Midodrine 2.5 mg every 8 hours initiated for hypotension.  Patient to continue midodrine as needed.   ESRD  Dialysis was continued per nephrology.  Last dialyzed on 07/15/2019. Pt to resume outpatient schedule.   Home medications were continued for other chronic diseases which remained stable.    Issues for Follow Up:  1. Patient treated for UTI with fosfomycin and imipenem.  Follow up with patient's symptoms for resolution of UTI.  2. Patient hypotensive intermittently throughout admission.  Continue midodrine on HD days as needed. 3. Patient to follow-up with cardiology on 07/27/2019.  Significant Procedures: Dialysis  Significant Labs and Imaging:  Recent Labs  Lab 07/15/19 0933 07/16/19 0235 07/17/19 0355  WBC 14.9* 14.6* 13.6*  HGB 11.9* 11.5* 11.1*  HCT 34.6* 33.0* 32.9*  PLT 101* 102* 105*   Recent Labs  Lab 07/13/19 1219 07/13/19 1219 07/13/19 1429 07/13/19 1429 07/14/19 0338 07/14/19 0338 07/15/19 0933 07/15/19 0933 07/16/19 0235 07/17/19 0355  NA 135  --   --   --  133*  --  131*  --  133* 135  K 5.7*   < > 4.3   < >  3.7   < > 3.8   < > 3.6 3.7  CL 95*  --   --   --  98  --  95*  --  96* 98  CO2 19*  --   --   --  19*  --  16*  --  23 26   GLUCOSE 106*  --   --   --  78  --  86  --  112* 136*  BUN 70*  --   --   --  79*  --  99*  --  50* 29*  CREATININE 11.34*  --   --   --  12.40*  --  13.54*  --  8.65* 6.56*  CALCIUM 8.8*  --   --   --  8.4*  --  8.1*  --  7.9* 8.2*  MG  --   --  1.7  --   --   --   --   --   --   --   PHOS  --   --   --   --  6.4*  --  7.4*  --  4.2 2.9  ALBUMIN  --   --   --   --  2.6*  --  2.5*  --  2.3* 2.6*   < > = values in this interval not displayed.    DG Chest Port 1 View  Result Date: 07/13/2019 CLINICAL DATA:  Chest pain EXAM: PORTABLE CHEST 1 VIEW COMPARISON:  Jun 20, 2019 FINDINGS: Lungs are clear. There is cardiomegaly with pulmonary vascularity within normal limits. No evident adenopathy. Mild soft tissue prominence in the superior mediastinum likely represents great vessel prominence. No pneumothorax. No bone lesions. IMPRESSION: Cardiomegaly. Lungs clear. Stable mild prominence in the superior mediastinum. Electronically Signed   By: Lowella Grip III M.D.   On: 07/13/2019 12:53     Results/Tests Pending at Time of Discharge:   Discharge Medications:  Allergies as of 07/17/2019   No Known Allergies     Medication List    STOP taking these medications   cefTRIAXone 1 g injection Commonly known as: ROCEPHIN   isosorbide mononitrate 30 MG 24 hr tablet Commonly known as: IMDUR     TAKE these medications   acetaminophen 325 MG tablet Commonly known as: TYLENOL Take 650 mg by mouth every 4 (four) hours as needed (for pain).   aspirin 81 MG tablet Chew 81 mg by mouth daily.   atorvastatin 80 MG tablet Commonly known as: LIPITOR Take 1 tablet (80 mg total) by mouth daily at 6 PM. What changed: when to take this   calcium carbonate 750 MG chewable tablet Commonly known as: TUMS EX Chew 1 tablet by mouth daily with supper.   ethyl chloride spray Apply 1 application topically every Monday, Wednesday, and Friday. Given at diaylasis   famotidine 20 MG tablet Commonly  known as: PEPCID Take 20 mg by mouth daily.   feeding supplement (NEPRO CARB STEADY) Liqd Take 237 mLs by mouth 2 (two) times daily between meals.   lamoTRIgine 25 MG tablet Commonly known as: LAMICTAL Take 25 mg by mouth at bedtime.   levETIRAcetam 500 MG tablet Commonly known as: KEPPRA Take 1 tablet (500 mg total) by mouth 2 (two) times daily.   metoprolol succinate 25 MG 24 hr tablet Commonly known as: TOPROL-XL Take 1 tablet (25 mg total) by mouth daily. Start taking on: Jul 18, 2019 What changed: how much to take  midodrine 2.5 MG tablet Commonly known as: PROAMATINE Take 1 tablet (2.5 mg total) by mouth 3 (three) times daily with meals as needed.   Nephro Vitamins 0.8 MG Tabs Take 1 tablet by mouth daily.   nitroGLYCERIN 0.4 MG SL tablet Commonly known as: NITROSTAT Place 0.4 mg under the tongue every 5 (five) minutes x 3 doses as needed for chest pain.   ticagrelor 90 MG Tabs tablet Commonly known as: BRILINTA Take 1 tablet (90 mg total) by mouth 2 (two) times daily.       Discharge Instructions: Please refer to Patient Instructions section of EMR for full details.  Patient was counseled important signs and symptoms that should prompt return to medical care, changes in medications, dietary instructions, activity restrictions, and follow up appointments.   Follow-Up Appointments: Follow-up Information    Troy Sine, MD .   Specialty: Cardiology Contact information: [address] [place] [address] [phone]        Caprice Renshaw, MD Follow up.   Specialty: Internal Medicine Contact information: [place] [address] [phone]           Lyndee Hensen, [place] 07/17/2019, 12:04 PM PGY-1, [facility]

## 2019-07-17 NOTE — Progress Notes (Signed)
PT Cancellation Note  Patient Details Name: George Burgess MRN: [id number] DOB: 1950-10-19   Cancelled Treatment:    Reason Eval/Treat Not Completed: Patient declined, no reason specified. Pt declining despite encouragement and education. He states "I've been in the hospital and need to rest". Pt becoming annoyed with PTA's attempts to reason with him and encourage him to participate. Will check back as time allows.    Allena Katz 07/17/2019, 2:03 PM

## 2019-07-17 NOTE — Discharge Summary (Signed)
[facility] Discharge Summary  Patient name: George Burgess Medical record number: [id number] Date of birth: [birth year] Age: 69 y.o. Gender: male Date of Admission: 07/13/2019  Date of Discharge: today  Admitting Physician: Lyndee Hensen, DO  Primary Care Provider: Caprice Renshaw, MD Consultants: Cardiology, Nephrology   Indication for Hospitalization: ACS rule out, UTI   Discharge Diagnoses/Problem List:  UTI  Recent NSTEMI CAD Atrial fibrillation / flutter  Combined systolic and diastolic congestive heart failure End-stage renal disease  Hyperlipidemia    Disposition: SNF  Discharge Condition: Improved, stable  Discharge Exam:  General: alert, comfortable in bed in no acute distress Cardiovascular: Irregularly regular rhythm, tachycardic  Respiratory: Clear to auscultation bilaterally, no increased work of breathing Abdomen: Soft, nontender nondistended Extremities: Left BKA, normal ROM of right lower extremity which is without edema  Skin: RLE chronic wound, dressing clean dry and intact    Brief Hospital Course:   George Burgess is a 69 y.o. male presenting with intermittent chest pain. PMH is significant for recent acute MI (06/20/2019), ESRD, CAD, HLD CHF, previous CVA, atrial fibrillation/flutter, dementia, hypertension  UTI Patient complained of dysuria on admission.  UA was concerning for infection.  Patient had leukocytosis, WBC 14.9 max.  Patient remained afebrile.  Patient was empirically started on meropenem as previous cultures were positive for Enterobacter and Klebsiella with resistance to ceftriaxone. Urine culture returned positive for Enterobacter and susceptibilities sensitive to fosfomycin.  Patient received 3 days of imipenem and transitioned to fosfomycin on 07/16/19.  Leukocytosis was downtrending prior to discharge.  Patient reported dysuria was improving on the day of discharge.   Atrial fibrillation/ flutter Patient  initially tachycardic 130s.  Initial EKG with wide-complex tachycardia.  Cardiology consulted and recommended increasing metoprolol to 25 mg XL daily.  Imdur was discontinued as patient was mildly hypotensive.  Patient to follow-up with cardiology in early June.    ACS Rule Out CAD HFrEF Patient with recent NSTEMI earlier this month.  Initial troponin elevated to 257.  Cardiology consulted and felt elevated troponin secondary to infection.  Karenz trended flat.  ACS was less likely.  Per cardiology, patient to continue DAPT therapy for for least one year.  He is stable his current regimen, including Metoprolol 25 mg XL QD.   Hypotension Patient intermittently hypotensive.  Imdur was discontinued by cardiology. Spoke with Dr. Marisue Ivan, cardiologist, regarding patient's blood pressure and tachycardia. Dr. Marisue Ivan agreed to start midodrine for patient's intermittently lower blood pressures given his ESRD and to continue Metroprolol dose at 25 mg.   Midodrine 2.5 mg every 8 hours initiated for hypotension.  Patient to continue midodrine as needed.   ESRD  Dialysis was continued per nephrology.  Last dialyzed on 07/15/2019. Pt to resume outpatient schedule.   Home medications were continued for other chronic diseases which remained stable.    Issues for Follow Up:  1. Patient treated for UTI with fosfomycin and imipenem.  Follow up with patient's symptoms for resolution of UTI.  2. Patient hypotensive intermittently throughout admission.  Continue midodrine on HD days as needed. 3. Patient to follow-up with cardiology on 07/27/2019.  Significant Procedures: Dialysis  Significant Labs and Imaging:  Recent Labs  Lab 07/15/19 0933 07/16/19 0235 07/17/19 0355  WBC 14.9* 14.6* 13.6*  HGB 11.9* 11.5* 11.1*  HCT 34.6* 33.0* 32.9*  PLT 101* 102* 105*   Recent Labs  Lab 07/13/19 1219 07/13/19 1219 07/13/19 1429 07/13/19 1429 07/14/19 4098 07/14/19 1191 07/15/19 0933 07/15/19 0933  07/16/19  0235 07/17/19 0355  NA 135  --   --   --  133*  --  131*  --  133* 135  K 5.7*   < > 4.3   < > 3.7   < > 3.8   < > 3.6 3.7  CL 95*  --   --   --  98  --  95*  --  96* 98  CO2 19*  --   --   --  19*  --  16*  --  23 26  GLUCOSE 106*  --   --   --  78  --  86  --  112* 136*  BUN 70*  --   --   --  79*  --  99*  --  50* 29*  CREATININE 11.34*  --   --   --  12.40*  --  13.54*  --  8.65* 6.56*  CALCIUM 8.8*  --   --   --  8.4*  --  8.1*  --  7.9* 8.2*  MG  --   --  1.7  --   --   --   --   --   --   --   PHOS  --   --   --   --  6.4*  --  7.4*  --  4.2 2.9  ALBUMIN  --   --   --   --  2.6*  --  2.5*  --  2.3* 2.6*   < > = values in this interval not displayed.    DG Chest Port 1 View  Result Date: 07/13/2019 CLINICAL DATA:  Chest pain EXAM: PORTABLE CHEST 1 VIEW COMPARISON:  Jun 20, 2019 FINDINGS: Lungs are clear. There is cardiomegaly with pulmonary vascularity within normal limits. No evident adenopathy. Mild soft tissue prominence in the superior mediastinum likely represents great vessel prominence. No pneumothorax. No bone lesions. IMPRESSION: Cardiomegaly. Lungs clear. Stable mild prominence in the superior mediastinum. Electronically Signed   By: Lowella Grip III M.D.   On: 07/13/2019 12:53     Results/Tests Pending at Time of Discharge:   Discharge Medications:  Allergies as of 07/17/2019   No Known Allergies     Medication List    STOP taking these medications   cefTRIAXone 1 g injection Commonly known as: ROCEPHIN   isosorbide mononitrate 30 MG 24 hr tablet Commonly known as: IMDUR     TAKE these medications   acetaminophen 325 MG tablet Commonly known as: TYLENOL Take 650 mg by mouth every 4 (four) hours as needed (for pain).   aspirin 81 MG tablet Chew 81 mg by mouth daily.   atorvastatin 80 MG tablet Commonly known as: LIPITOR Take 1 tablet (80 mg total) by mouth daily at 6 PM. What changed: when to take this   calcium carbonate 750 MG  chewable tablet Commonly known as: TUMS EX Chew 1 tablet by mouth daily with supper.   ethyl chloride spray Apply 1 application topically every Monday, Wednesday, and Friday. Given at diaylasis   famotidine 20 MG tablet Commonly known as: PEPCID Take 20 mg by mouth daily.   feeding supplement (NEPRO CARB STEADY) Liqd Take 237 mLs by mouth 2 (two) times daily between meals.   lamoTRIgine 25 MG tablet Commonly known as: LAMICTAL Take 25 mg by mouth at bedtime.   levETIRAcetam 500 MG tablet Commonly known as: KEPPRA Take 1 tablet (500 mg total) by mouth 2 (two) times daily.  metoprolol succinate 25 MG 24 hr tablet Commonly known as: TOPROL-XL Take 1 tablet (25 mg total) by mouth daily. Start taking on: Jul 18, 2019 What changed: how much to take   midodrine 2.5 MG tablet Commonly known as: PROAMATINE Take 1 tablet (2.5 mg total) by mouth 3 (three) times daily with meals as needed.   Nephro Vitamins 0.8 MG Tabs Take 1 tablet by mouth daily.   nitroGLYCERIN 0.4 MG SL tablet Commonly known as: NITROSTAT Place 0.4 mg under the tongue every 5 (five) minutes x 3 doses as needed for chest pain.   ticagrelor 90 MG Tabs tablet Commonly known as: BRILINTA Take 1 tablet (90 mg total) by mouth 2 (two) times daily.       Discharge Instructions: Please refer to Patient Instructions section of EMR for full details.  Patient was counseled important signs and symptoms that should prompt return to medical care, changes in medications, dietary instructions, activity restrictions, and follow up appointments.   Follow-Up Appointments: Follow-up Information    Troy Sine, MD .   Specialty: Cardiology Contact information: [address] [place] [address] [phone]        Caprice Renshaw, MD Follow up.   Specialty: Internal Medicine Contact information: [place] [address] [phone]           Lyndee Hensen,  [place] 07/17/2019, 12:15 PM PGY-1, [place]

## 2019-07-17 NOTE — TOC Transition Note (Signed)
Transition of Care [facility]) - CM/SW Discharge Note   Patient Details  Name: George Burgess MRN: [id number] Date of Birth: 01-15-1951  Transition of Care [facility]) CM/SW Contact:  Bary Castilla, LCSW Phone Number: [phone] 07/17/2019, 12:12 PM   Clinical Narrative:    Patient will DC to:?[facility] Anticipated DC date:07/17/19? Family notified: Vivien Rota had to leave message? Transport [id number]  Per MD patient ready for DC to [facility]. RN, patient, patient's family, and facility notified of DC. Discharge Summary sent to facility. RN given number for report [phone] room 901A. DC packet on chart. Ambulance transport requested for patient.   CSW signing off.   [place], [place] [phone]    Final next level of care: Skilled Nursing Facility Barriers to Discharge: No Barriers Identified   Patient Goals and CMS Choice        Discharge Placement              Patient chooses bed at: [facility]) Patient to be transferred to facility by: [place] Name of family member notified: Vivien Rota Patient and family notified of of transfer: 07/17/19  Discharge Plan and Services In-house Referral: Clinical Social Work                                   Social Determinants of Health (SDOH) Interventions     Readmission Risk Interventions No flowsheet data found.

## 2019-07-17 NOTE — Progress Notes (Addendum)
D/c tele and IV. Called report to Quillian Quince, RN in [facility]. Helped pt dressing. Packed pt's belongings. Await for [facility] to pick pt up.   Lavenia Atlas, RN

## 2019-07-20 MED ORDER — GENERIC EXTERNAL MEDICATION
1000.00 | Status: DC
Start: 2019-07-20 — End: 2019-07-20

## 2019-07-20 MED ORDER — RENAL-VITE 0.8 MG PO TABS
1.00 | ORAL_TABLET | ORAL | Status: DC
Start: 2019-07-24 — End: 2019-07-20

## 2019-07-20 MED ORDER — ASPIRIN 81 MG PO TBEC
81.00 | DELAYED_RELEASE_TABLET | ORAL | Status: DC
Start: 2019-07-23 — End: 2019-07-20

## 2019-07-20 MED ORDER — MIDODRINE HCL 2.5 MG PO TABS
2.50 | ORAL_TABLET | ORAL | Status: DC
Start: 2019-07-23 — End: 2019-07-20

## 2019-07-20 MED ORDER — GENERIC EXTERNAL MEDICATION
1.00 | Status: DC
Start: ? — End: 2019-07-20

## 2019-07-20 MED ORDER — SEVELAMER CARBONATE 800 MG PO TABS
800.00 | ORAL_TABLET | ORAL | Status: DC
Start: 2019-07-23 — End: 2019-07-20

## 2019-07-20 MED ORDER — ATORVASTATIN CALCIUM 40 MG PO TABS
80.00 | ORAL_TABLET | ORAL | Status: DC
Start: 2019-07-23 — End: 2019-07-20

## 2019-07-20 MED ORDER — METOPROLOL SUCCINATE ER 25 MG PO TB24
25.00 | ORAL_TABLET | ORAL | Status: DC
Start: 2019-07-20 — End: 2019-07-20

## 2019-07-20 MED ORDER — ACETAMINOPHEN 325 MG PO TABS
650.00 | ORAL_TABLET | ORAL | Status: DC
Start: ? — End: 2019-07-20

## 2019-07-20 MED ORDER — LEVETIRACETAM 500 MG PO TABS
500.00 | ORAL_TABLET | ORAL | Status: DC
Start: 2019-07-23 — End: 2019-07-20

## 2019-07-20 MED ORDER — CALCIUM CARBONATE 1250 (500 CA) MG PO CHEW
750.00 | CHEWABLE_TABLET | ORAL | Status: DC
Start: 2019-07-24 — End: 2019-07-20

## 2019-07-20 MED ORDER — LAMOTRIGINE 25 MG PO TABS
25.00 | ORAL_TABLET | ORAL | Status: DC
Start: 2019-07-23 — End: 2019-07-20

## 2019-07-20 MED ORDER — FAMOTIDINE 20 MG PO TABS
20.00 | ORAL_TABLET | ORAL | Status: DC
Start: 2019-07-24 — End: 2019-07-20

## 2019-07-23 MED ORDER — ONDANSETRON HCL 4 MG/2ML IJ SOLN
4.00 | INTRAMUSCULAR | Status: DC
Start: ? — End: 2019-07-23

## 2019-07-23 MED ORDER — OXYCODONE HCL 5 MG PO TABS
5.00 | ORAL_TABLET | ORAL | Status: DC
Start: ? — End: 2019-07-23

## 2019-07-23 MED ORDER — DEXTROSE 10 % IV SOLN
125.00 | INTRAVENOUS | Status: DC
Start: ? — End: 2019-07-23

## 2019-07-23 MED ORDER — METOPROLOL TARTRATE 5 MG/5ML IV SOLN
5.00 | INTRAVENOUS | Status: DC
Start: ? — End: 2019-07-23

## 2019-07-23 MED ORDER — INSULIN LISPRO 100 UNIT/ML ~~LOC~~ SOLN
0.00 | SUBCUTANEOUS | Status: DC
Start: 2019-07-23 — End: 2019-07-23

## 2019-07-23 MED ORDER — CLOPIDOGREL BISULFATE 75 MG PO TABS
75.00 | ORAL_TABLET | ORAL | Status: DC
Start: 2019-07-23 — End: 2019-07-23

## 2019-07-23 MED ORDER — GLUCOSE 40 % PO GEL
15.00 | ORAL | Status: DC
Start: ? — End: 2019-07-23

## 2019-07-23 MED ORDER — GLUCAGON (RDNA) 1 MG IJ KIT
1.00 | PACK | INTRAMUSCULAR | Status: DC
Start: ? — End: 2019-07-23

## 2019-07-23 MED ORDER — GENERIC EXTERNAL MEDICATION
0.00 | Status: DC
Start: ? — End: 2019-07-23

## 2019-07-23 MED ORDER — GENERIC EXTERNAL MEDICATION
12.50 | Status: DC
Start: 2019-07-23 — End: 2019-07-23

## 2019-07-23 MED ORDER — CARVEDILOL 3.125 MG PO TABS
3.13 | ORAL_TABLET | ORAL | Status: DC
Start: 2019-07-23 — End: 2019-07-23

## 2019-07-23 MED ORDER — ACETAMINOPHEN 325 MG PO TABS
650.00 | ORAL_TABLET | ORAL | Status: DC
Start: 2019-07-23 — End: 2019-07-23

## 2019-07-27 ENCOUNTER — Ambulatory Visit: Payer: Medicare Other | Admitting: Medical

## 2019-07-27 MED ORDER — EPOETIN ALFA-EPBX 20000 UNIT/ML IJ SOLN
20000.00 | INTRAMUSCULAR | Status: DC
Start: 2019-07-31 — End: 2019-07-27

## 2019-07-27 MED ORDER — HEPARIN SODIUM (PORCINE) 5000 UNIT/ML IJ SOLN
5000.00 | INTRAMUSCULAR | Status: DC
Start: 2019-07-27 — End: 2019-07-27

## 2019-07-27 MED ORDER — HEPARIN SODIUM (PORCINE) 10000 UNIT/ML IJ SOLN
0.50 | INTRAMUSCULAR | Status: DC
Start: ? — End: 2019-07-27

## 2019-07-27 MED ORDER — OXIDIZED CELLULOSE EX PADS
2.00 | MEDICATED_PAD | CUTANEOUS | Status: DC
Start: ? — End: 2019-07-27

## 2019-09-08 ENCOUNTER — Other Ambulatory Visit: Payer: Self-pay

## 2019-09-08 ENCOUNTER — Emergency Department (HOSPITAL_COMMUNITY)
Admission: EM | Admit: 2019-09-08 | Discharge: 2019-09-09 | Disposition: A | Discharge status: Home / Self Care | Payer: Medicare Other | Attending: Emergency Medicine | Admitting: Emergency Medicine

## 2019-09-08 ENCOUNTER — Encounter (HOSPITAL_COMMUNITY): Payer: Self-pay | Admitting: *Deleted

## 2019-09-08 DIAGNOSIS — I10 Essential (primary) hypertension: Secondary | ICD-10-CM | POA: Diagnosis not present

## 2019-09-08 DIAGNOSIS — I5042 Chronic combined systolic (congestive) and diastolic (congestive) heart failure: Secondary | ICD-10-CM | POA: Diagnosis not present

## 2019-09-08 DIAGNOSIS — Z20822 Contact with and (suspected) exposure to covid-19: Secondary | ICD-10-CM | POA: Diagnosis not present

## 2019-09-08 DIAGNOSIS — I2510 Atherosclerotic heart disease of native coronary artery without angina pectoris: Secondary | ICD-10-CM | POA: Insufficient documentation

## 2019-09-08 DIAGNOSIS — R4182 Altered mental status, unspecified: Secondary | ICD-10-CM | POA: Insufficient documentation

## 2019-09-08 DIAGNOSIS — Z7982 Long term (current) use of aspirin: Secondary | ICD-10-CM | POA: Insufficient documentation

## 2019-09-08 DIAGNOSIS — Z87891 Personal history of nicotine dependence: Secondary | ICD-10-CM | POA: Diagnosis not present

## 2019-09-08 DIAGNOSIS — R079 Chest pain, unspecified: Secondary | ICD-10-CM

## 2019-09-08 DIAGNOSIS — R0789 Other chest pain: Secondary | ICD-10-CM | POA: Insufficient documentation

## 2019-09-08 DIAGNOSIS — Z951 Presence of aortocoronary bypass graft: Secondary | ICD-10-CM | POA: Insufficient documentation

## 2019-09-08 DIAGNOSIS — E119 Type 2 diabetes mellitus without complications: Secondary | ICD-10-CM | POA: Diagnosis not present

## 2019-09-08 DIAGNOSIS — Z79899 Other long term (current) drug therapy: Secondary | ICD-10-CM | POA: Insufficient documentation

## 2019-09-08 LAB — BASIC METABOLIC PANEL
Anion gap: 11 (ref 5–15)
BUN: 30 mg/dL — ABNORMAL HIGH (ref 8–23)
CO2: 26 mmol/L (ref 22–32)
Calcium: 9.9 mg/dL (ref 8.9–10.3)
Chloride: 102 mmol/L (ref 98–111)
Creatinine, Ser: 5.57 mg/dL — ABNORMAL HIGH (ref 0.61–1.24)
GFR calc Af Amer: 11 mL/min — ABNORMAL LOW (ref >60)
GFR calc non Af Amer: 10 mL/min — ABNORMAL LOW (ref >60)
Glucose, Bld: 111 mg/dL — ABNORMAL HIGH (ref 70–99)
Potassium: 4 mmol/L (ref 3.5–5.1)
Sodium: 139 mmol/L (ref 135–145)

## 2019-09-08 LAB — CBC
HCT: 32 % — ABNORMAL LOW (ref 39.0–52.0)
Hemoglobin: 10.1 g/dL — ABNORMAL LOW (ref 13.0–17.0)
MCH: 29.6 pg (ref 26.0–34.0)
MCHC: 31.6 g/dL (ref 30.0–36.0)
MCV: 93.8 fL (ref 80.0–100.0)
Platelets: 149 10*3/uL — ABNORMAL LOW (ref 150–400)
RBC: 3.41 MIL/uL — ABNORMAL LOW (ref 4.22–5.81)
RDW: 16.4 % — ABNORMAL HIGH (ref 11.5–15.5)
WBC: 7.6 10*3/uL (ref 4.0–10.5)
nRBC: 0 % (ref 0.0–0.2)

## 2019-09-08 LAB — TROPONIN I (HIGH SENSITIVITY)
Troponin I (High Sensitivity): 45 ng/L — ABNORMAL HIGH (ref <18)
Troponin I (High Sensitivity): 43 ng/L — ABNORMAL HIGH (ref <18)

## 2019-09-08 MED ORDER — SODIUM CHLORIDE 0.9% FLUSH
3.0000 mL | Freq: Once | INTRAVENOUS | Status: AC
  Administered 2019-09-09: 3 mL via INTRAVENOUS

## 2019-09-08 NOTE — ED Notes (Signed)
Called pt x3 for triage, no response. 

## 2019-09-08 NOTE — ED Provider Notes (Signed)
[facility] EMERGENCY DEPARTMENT Provider Note   CSN: [id number] Arrival date & time: 09/08/19  1515     History Chief Complaint  Patient presents with  . Dizziness    George Burgess is a 69 y.o. male.  HPI      Level 5 caveat due to dementia.  George Burgess is a 69 y.o. male, with a history of A. fib, combined systolic and diastolic CHF, dementia, ESRD on dialysis, hyperlipidemia, stroke, DM, presenting to the ED via EMS from [facility] for unknown complaint. Patient states he is not quite sure why he is here.  When I asked about chest pain, patient initially states he does have chest pain, however, when I asked again a few minutes later, patient denies any pain or complaints whatsoever, including denying chest pain.   Past Medical History:  Diagnosis Date  . Atrial fibrillation ([place])    a. Chronic Eliquis (CHA2DS2VASc = 6).  . Chronic combined systolic (congestive) and diastolic (congestive) heart failure ([place])    a. Previously reported EF of 30%;  b. 08/2016 Echo: EF 40-45%, antsept, inf, infsept HK, Gr3 DD, mod AI/MR, sev dil LA, mild TR, PASP 45mmHg.  Marland Kitchen Dementia ([place])   . Encephalopathy   . End stage renal disease ([place])    a. On HD.  Marland Kitchen Epilepsy, unspecified, not intractable, without status epilepticus ([place]) 03/21/2018   provided from [facility] records  . Essential hypertension   . GIB (gastrointestinal bleeding)    a. 08/2016 in setting of Eliquis Rx.  Marland Kitchen Hyperlipidemia   . Pleural effusion   . PVD (peripheral vascular disease) ([place])    a. s/p L BKA;  b. Chronic LE wounds.  . Stroke ([place])   . Type II diabetes mellitus [facility])     Patient Active Problem List   Diagnosis Date Noted  . 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 Urinary tract infection without hematuria   . Tachycardia   . Chest pain 07/13/2019  . Epilepsy ([place]) 07/13/2019  . Altered mental status   . Atrial flutter ([place])   . History of ST  elevation myocardial infarction (STEMI) 06/20/2019  . Dementia ([place])   . Chronic combined systolic (congestive) and diastolic (congestive) heart failure ([place])   . PVD (peripheral vascular disease) ([place])   . History of stroke   . Seizure ([place]) 09/11/2017  . ESRD on hemodialysis ([place]) 09/11/2017  . Elevated troponin 09/11/2017  . Atrial fibrillation, chronic 09/11/2017  . 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[place]) 09/11/2017  . Essential hypertension 09/11/2017  . Hypoglycemia 09/11/2017  . Syncope 09/10/2017  . Blindness   . Bloody stools   . Encounter for nasogastric (NG) tube placement   . Lower GI bleed   . Malnutrition of moderate degree 09/05/2016  . Acute GI bleeding 09/04/2016    Past Surgical History:  Procedure Laterality Date  . BELOW KNEE LEG AMPUTATION Left   . CORONARY/GRAFT ACUTE MI REVASCULARIZATION N/A 06/20/2019   Procedure: Coronary/Graft Acute MI Revascularization;  Surgeon: Troy Sine, MD;  Location: [facility] CV LAB;  Service: Cardiovascular;  Laterality: N/A;  . FLEXIBLE SIGMOIDOSCOPY N/A 09/07/2016   Procedure: FLEXIBLE SIGMOIDOSCOPY;  Surgeon: Manus Gunning, MD;  Location: [facility] ENDOSCOPY;  Service: Gastroenterology;  Laterality: N/A;  . LEFT HEART CATH AND CORONARY ANGIOGRAPHY N/A 06/20/2019   Procedure: LEFT HEART CATH AND CORONARY ANGIOGRAPHY;  Surgeon: Troy Sine, MD;  Location: [facility] CV LAB;  Service: Cardiovascular;  Laterality: N/A;       Family History  Problem Relation Age of Onset  . Other Mother        Pt unsure of PMH of family members.    Social History   Tobacco Use  . Smoking status: Former Research scientist (life sciences)  . Smokeless tobacco: Never Used  . Tobacco comment: Pt thinks he used to smoke cigarettes.  Thinks he quit many years ago.  Vaping Use  . Vaping Use: Never used  Substance Use Topics  . Alcohol use: No  . Drug use: No    Home Medications Prior to Admission medications   Medication Sig Start Date End Date  Taking? Authorizing Provider  acetaminophen (TYLENOL) 325 MG tablet Take 650 mg by mouth every 4 (four) hours as needed (for pain).     [provider]  aspirin 81 MG tablet Chew 81 mg by mouth daily.    [provider]  atorvastatin (LIPITOR) 80 MG tablet Take 1 tablet (80 mg total) by mouth daily at 6 PM. Patient taking differently: Take 80 mg by mouth at bedtime.  06/22/19   Kroeger, Lorelee Cover., PA-C  B Complex-C-Folic Acid (NEPHRO VITAMINS) 0.8 MG TABS Take 1 tablet by mouth daily.    [provider]  calcium carbonate (TUMS EX) 750 MG chewable tablet Chew 1 tablet by mouth daily with supper.     [provider]  ethyl chloride spray Apply 1 application topically every Monday, Wednesday, and Friday. Given at diaylasis    [provider]  famotidine (PEPCID) 20 MG tablet Take 20 mg by mouth daily.    [provider]  lamoTRIgine (LAMICTAL) 25 MG tablet Take 25 mg by mouth at bedtime.     [provider]  levETIRAcetam (KEPPRA) 500 MG tablet Take 1 tablet (500 mg total) by mouth 2 (two) times daily. 03/21/18   Danford, Suann Larry, MD  metoprolol succinate (TOPROL-XL) 25 MG 24 hr tablet Take 1 tablet (25 mg total) by mouth daily. 07/18/19 08/17/19  Lyndee Hensen, DO  nitroGLYCERIN (NITROSTAT) 0.4 MG SL tablet Place 0.4 mg under the tongue every 5 (five) minutes x 3 doses as needed for chest pain.     [provider]  Nutritional Supplements (FEEDING SUPPLEMENT, NEPRO CARB STEADY,) LIQD Take 237 mLs by mouth 2 (two) times daily between meals.     [provider]  ticagrelor (BRILINTA) 90 MG TABS tablet Take 1 tablet (90 mg total) by mouth 2 (two) times daily. 06/22/19   Kroeger, Lorelee Cover., PA-C    Allergies    Patient has no known allergies.  Review of Systems   Review of Systems  Unable to perform ROS: Dementia    Physical Exam Updated Vital Signs BP (!) 148/103 (BP Location: Right Arm)   Pulse 90   Temp (!)  97.4 F (36.3 C) (Oral)   Resp 20   Ht 6' (1.829 m)   Wt 65.4 kg   SpO2 93%   BMI 19.55 kg/m   Physical Exam Vitals and nursing note reviewed.  Constitutional:      General: He is not in acute distress.    Appearance: He is well-developed. He is not diaphoretic.  HENT:     Head: Normocephalic and atraumatic.     Mouth/Throat:     Mouth: Mucous membranes are moist.     Pharynx: Oropharynx is clear.  Eyes:     Conjunctiva/sclera: Conjunctivae normal.  Cardiovascular:     Rate  and Rhythm: Normal rate and regular rhythm.     Pulses: Normal pulses.          Radial pulses are 2+ on the right side and 2+ on the left side.       Posterior tibial pulses are 2+ on the right side.     Heart sounds: Normal heart sounds.     Comments: Tactile temperature in the extremities appropriate and equal bilaterally. Pulmonary:     Effort: Pulmonary effort is normal. No respiratory distress.     Breath sounds: Normal breath sounds.     Comments: No increased work of breathing.  Speaks in full sentences without noted difficulty. Abdominal:     Palpations: Abdomen is soft.     Tenderness: There is no abdominal tenderness. There is no guarding.  Musculoskeletal:     Cervical back: Neck supple.     Right lower leg: No edema.     Left lower leg: No edema.     Comments: Patient's right lower leg was wrapped in Kerlix and Ace wrap.  This was unwrapped and the leg was inspected.  He appears to have some superficial appearing wounds to the anterior right lower leg.  No tenderness, swelling, or color abnormality noted.     Left Lower Extremity: Left leg is amputated below knee.  Lymphadenopathy:     Cervical: No cervical adenopathy.  Skin:    General: Skin is warm and dry.  Neurological:     Mental Status: He is alert.     Comments: Patient is able to answer with his name.  He knows that he is at [facility].  He is unable to tell me the date or what brought him to the hospital.  Sensation  light touch grossly intact in the extremities. Grip strength equal bilaterally. Strength 4/5 in the upper extremities and equal bilaterally. Strength 4/5 in the lower extremities and equal bilaterally. Coordination intact with finger-to-nose testing. Cranial nerves III through XII grossly intact.  Psychiatric:        Mood and Affect: Mood and affect normal.        Speech: Speech normal.        Behavior: Behavior normal.     ED Results / Procedures / Treatments   Labs (all labs ordered are listed, but only abnormal results are displayed) Labs Reviewed  BASIC METABOLIC PANEL - Abnormal; Notable for the following components:      Result Value   Glucose, Bld 111 (*)    BUN 30 (*)    Creatinine, Ser 5.57 (*)    GFR calc non Af Amer 10 (*)    GFR calc Af Amer 11 (*)    All other components within normal limits  CBC - Abnormal; Notable for the following components:   RBC 3.41 (*)    Hemoglobin 10.1 (*)    HCT 32.0 (*)    RDW 16.4 (*)    Platelets 149 (*)    All other components within normal limits  LACTIC ACID, PLASMA - Abnormal; Notable for the following components:   Lactic Acid, Venous 2.8 (*)    All other components within normal limits  ACETAMINOPHEN LEVEL - Abnormal; Notable for the following components:   Acetaminophen (Tylenol), Serum <10 (*)    All other components within normal limits  SALICYLATE LEVEL - Abnormal; Notable for the following components:   Salicylate Lvl <7.8 (*)    All other components within normal limits  HEPATIC FUNCTION PANEL - Abnormal;  Notable for the following components:   Alkaline Phosphatase 132 (*)    Total Bilirubin 1.9 (*)    Bilirubin, Direct 0.4 (*)    Indirect Bilirubin 1.5 (*)    All other components within normal limits  CBG MONITORING, ED - Abnormal; Notable for the following components:   Glucose-Capillary 113 (*)    All other components within normal limits  TROPONIN I (HIGH SENSITIVITY) - Abnormal; Notable for the following  components:   Troponin I (High Sensitivity) 45 (*)    All other components within normal limits  TROPONIN I (HIGH SENSITIVITY) - Abnormal; Notable for the following components:   Troponin I (High Sensitivity) 43 (*)    All other components within normal limits  SARS CORONAVIRUS 2 BY RT PCR (HOSPITAL ORDER, [facility])  CULTURE, BLOOD (ROUTINE X 2)  CULTURE, BLOOD (ROUTINE X 2)  ETHANOL  LIPASE, BLOOD  LACTIC ACID, PLASMA    EKG EKG Interpretation  Date/Time:  Wednesday September 08 2019 16:13:29 EDT Ventricular Rate:  94 PR Interval:    QRS Duration: 154 QT Interval:  444 QTC Calculation: 555 R Axis:   -130 Text Interpretation: Undetermined rhythm , probable AFib Right bundle branch block Anteroseptal infarct , age undetermined Abnormal ECG Confirmed by Addison Lank [phone]) on 09/08/2019 11:35:08 PM   Radiology DG Chest 2 View  Result Date: 09/09/2019 CLINICAL DATA:  Altered mental status. EXAM: CHEST - 2 VIEW COMPARISON:  Jul 19, 2019 FINDINGS: The heart size is enlarged. There are moderate-sized bilateral pleural effusions with adjacent airspace disease favored to represent atelectasis. There is a tunneled dialysis catheter on the right that is well position. There is no pneumothorax. There is vascular congestion. IMPRESSION: Cardiomegaly with moderate-sized bilateral pleural effusions and adjacent airspace disease favored to represent atelectasis. Electronically Signed   By: Constance Holster M.D.   On: 09/09/2019 00:42   CT Head Wo Contrast  Result Date: 09/09/2019 CLINICAL DATA:  Altered level of consciousness, confusion EXAM: CT HEAD WITHOUT CONTRAST TECHNIQUE: Contiguous axial images were obtained from the base of the skull through the vertex without intravenous contrast. COMPARISON:  03/20/2018, 03/19/2018 FINDINGS: Brain: Chronic left parietal infarct unchanged. Stable chronic small-vessel ischemic changes throughout the white matter and bilateral  basal ganglia. No signs of acute infarct or hemorrhage. Lateral ventricles and midline structures are stable. No acute extra-axial fluid collections. There is a stable right frontal meningioma. No mass effect. Vascular: No hyperdense vessel or unexpected calcification. Skull: Normal. Negative for fracture or focal lesion. Sinuses/Orbits: No acute finding. Other: None. IMPRESSION: 1. Chronic ischemic changes as above, no acute process. 2. Stable right frontal meningioma. Electronically Signed   By: Randa Ngo M.D.   On: 09/09/2019 00:49    Procedures Procedures (including critical care time)  Medications Ordered in ED Medications  sodium chloride flush (NS) 0.9 % injection 3 mL (3 mLs Intravenous Given 09/09/19 0355)    ED Course  I have reviewed the triage vital signs and the nursing notes.  Pertinent labs & imaging results that were available during my care of the patient were reviewed by me and considered in my medical decision making (see chart for details).  Clinical Course as of Sep 09 539  Thu Sep 09, 2019  0018 Tried to call patient's listed contact, [name] (brother), [phone].  No answer.  Left voicemail with callback number.   [SJ]  [place] [phone]). Spoke with Nia, nurse in patient's section. States she is not  familiar with him and was not there when patient was sent to the ED.  She states the nursing notes seem to indicate patient was sent to the emergency department due to complaints of chest pain. Refused dialysis today, which she has done in the past.  As far as the notes indicate, it does not seem as though he refused dialysis on Monday. No noted recent trauma, behavior changes, infection symptoms, etc.   [SJ]  0112 Spoke with [name], patient's brother. He states patient will have bouts of confusion many times consistent with his current mental status. It is not unusual for him to refuse dialysis because he forgets that he needs to get  dialysis. He is many times unreliable in his complaints.   [SJ]  0201 Continues to deny complaints.   [SJ]    Clinical Course User Index [SJ] Raffaele Derise, Helane Gunther, PA-C   MDM Rules/Calculators/A&P                          Patient presents initially unsure of his presenting complaint.  At one point, he does state he has chest pain, but then short time later denies this complaint. Patient is nontoxic appearing, afebrile, not tachycardic, not tachypneic, not hypotensive, maintains excellent SPO2 on room air, and is in no apparent distress.  It was also thought that patient could have altered mental status, however, after speaking with the nursing staff at the nursing facility as well as the patient's brother, it seems as though patient is mentally at his baseline.   I have reviewed the patient's chart to obtain more information.   I reviewed and interpreted the patient's labs and radiological studies. Troponin is in the 40s, but flat.  This could be a function of the patient's status is a dialysis patient. Possible pleural effusions on chest x-ray, however, patient without shortness of breath, tachypnea, increased work of breathing, or hypoxia. I reassessed patient multiple times during his ED course without any abnormalities noted, despite intermittent documentation of tachypnea on patient's vital signs.  He has no orthopnea or lower extremity edema. Lactic acid mildly elevated.  My suspicion for sepsis is quite low. It was very difficult to obtain blood on this patient, even after multiple attempts and utilizing IV team. Mild bilirubin elevation, however, no abdominal tenderness across multiple assessments.  We will have him follow-up on these abnormalities with his PCP versus GI.  Message was sent to Renal Case Manager, Terri Piedra, to assist patient with rescheduling his dialysis.   Patient to return to his nursing facility.     Findings and plan of care discussed with Addison Lank, MD.  Dr. Leonette Monarch personally evaluated and examined this patient.  Vitals:   09/09/19 0319 09/09/19 0335 09/09/19 0344 09/09/19 0430  BP:  (!) 126/94  (!) 138/106  Pulse:   82 93  Resp:  (!) 30 (!) 23 20  Temp:    (!) 97.3 F (36.3 C)  TempSrc:    Rectal  SpO2: 96%  96% 100%  Weight:      Height:       Vitals:   09/09/19 0430 09/09/19 0530 09/09/19 0542 09/09/19 0545  BP: (!) 138/106 135/87  120/85  Pulse: 93  65   Resp: 20 19 15 17   Temp: (!) 97.3 F (36.3 C)     TempSrc: Rectal     SpO2: 100%  100%   Weight:      Height:  Final Clinical Impression(s) / ED Diagnoses Final diagnoses:  Chest pain, unspecified type    Rx / DC Orders ED Discharge Orders    None       Layla Maw 09/09/19 0555    Fatima Blank, MD 09/10/19 0800

## 2019-09-08 NOTE — ED Notes (Signed)
PT IN TRIAGE

## 2019-09-08 NOTE — ED Triage Notes (Signed)
Pt c/o head chest and throat

## 2019-09-08 NOTE — ED Triage Notes (Signed)
The pt is very confused he reports that he has had a stroke and was in [place] for 2 months  He resides at [facility]

## 2019-09-08 NOTE — ED Notes (Signed)
Pt called for vitals x3. No answer

## 2019-09-09 ENCOUNTER — Other Ambulatory Visit: Payer: Self-pay

## 2019-09-09 ENCOUNTER — Emergency Department (HOSPITAL_COMMUNITY): Payer: Medicare Other

## 2019-09-09 DIAGNOSIS — R0789 Other chest pain: Secondary | ICD-10-CM | POA: Diagnosis not present

## 2019-09-09 LAB — HEPATIC FUNCTION PANEL
ALT: 17 U/L (ref 0–44)
AST: 28 U/L (ref 15–41)
Albumin: 3.9 g/dL (ref 3.5–5.0)
Alkaline Phosphatase: 132 U/L — ABNORMAL HIGH (ref 38–126)
Bilirubin, Direct: 0.4 mg/dL — ABNORMAL HIGH (ref 0.0–0.2)
Indirect Bilirubin: 1.5 mg/dL — ABNORMAL HIGH (ref 0.3–0.9)
Total Bilirubin: 1.9 mg/dL — ABNORMAL HIGH (ref 0.3–1.2)
Total Protein: 7.7 g/dL (ref 6.5–8.1)

## 2019-09-09 LAB — LIPASE, BLOOD: Lipase: 43 U/L (ref 11–51)

## 2019-09-09 LAB — ETHANOL: Alcohol, Ethyl (B): <10 mg/dL (ref <10)

## 2019-09-09 LAB — LACTIC ACID, PLASMA: Lactic Acid, Venous: 2.8 mmol/L (ref 0.5–1.9)

## 2019-09-09 LAB — ACETAMINOPHEN LEVEL: Acetaminophen (Tylenol), Serum: <10 ug/mL — ABNORMAL LOW (ref 10–30)

## 2019-09-09 LAB — SARS CORONAVIRUS 2 BY RT PCR (HOSPITAL ORDER, PERFORMED IN ~~LOC~~ HOSPITAL LAB): SARS Coronavirus 2: NEGATIVE

## 2019-09-09 LAB — CBG MONITORING, ED: Glucose-Capillary: 113 mg/dL — ABNORMAL HIGH (ref 70–99)

## 2019-09-09 LAB — SALICYLATE LEVEL: Salicylate Lvl: <7 mg/dL — ABNORMAL LOW (ref 7.0–30.0)

## 2019-09-09 NOTE — ED Notes (Signed)
PT desat while moving around to 89%, 2 L  applied.

## 2019-09-09 NOTE — Discharge Instructions (Addendum)
Work-up here was overall reassuring. Follow-up with cardiology for any further evaluation of chest pain. Be sure to reschedule your dialysis appointment.  There is a nurse that we have contacted here at the hospital that should be able to assist you with rescheduling this appointment. Return to the emergency department for recurrence of chest pain, shortness of breath, abdominal pain, or any other major concerns.  There were some mild abnormalities in the bilirubin, which is a liver enzyme.  Please have this value reassessed by your primary care provider or a gastroenterologist.

## 2019-09-09 NOTE — ED Provider Notes (Signed)
Attestation: Medical screening examination/treatment/procedure(s) were conducted as a shared visit with non-physician practitioner(s) and myself.  I personally evaluated the patient during the encounter.   Briefly, the patient is a 69 y.o. male with h/o esrd on hd, dementia, living at sNF, here for complaint of chest pain per sNF staff. Reported refused to go to HD.   Vitals:   09/09/19 0605 09/09/19 0645  BP:  128/78  Pulse:  66  Resp:  20  Temp:  97.6 F (36.4 C)  SpO2: 100% 96%    CONSTITUTIONAL:  Chronically ill-appearing, nut nontoxic NEURO:  Alert and oriented x 1, no focal deficits EYES:  pupils equal and reactive ENT/NECK:  trachea midline, no JVD CARDIO:  reg rate, reg rhythm, well-perfused PULM:   None labored breathing GI/GU:  Abdomen non-distended MSK/SPINE:  No gross deformities, no edema SKIN:  Left arm wound over AVF healing well. Small pressure ulcer on right heel, well appearing.     EKG Interpretation  Date/Time:  Wednesday September 08 2019 16:13:29 EDT Ventricular Rate:  94 PR Interval:    QRS Duration: 154 QT Interval:  444 QTC Calculation: 555 R Axis:   -130 Text Interpretation: Undetermined rhythm , probable AFib Right bundle branch block Anteroseptal infarct , age undetermined Abnormal ECG Confirmed by Addison Lank [phone]) on 09/08/2019 11:35:08 PM       Work up reassuring w/o severe acute process requiring emergent intervention. Patient does require HD. Renal case manager contacted to assist with insuring he makes it to HD.  The patient appears reasonably screened and/or stabilized for discharge and I doubt any other medical condition or other [facility] requiring further screening, evaluation, or treatment in the ED at this time prior to discharge. Safe for discharge with strict return precautions. Fatima Blank, MD 09/10/19 0800

## 2019-09-09 NOTE — ED Notes (Signed)
Breakfast Ordered--Jami Bogdanski  

## 2019-09-09 NOTE — ED Notes (Addendum)
Patient is a hard stick but gotten some of his labs .iv team came started an IV NO BLOOD RETURN.NURSE WAS NOTIFIED

## 2019-09-09 NOTE — ED Notes (Signed)
CALLED [facility] FOR TRANSPORT TO [facility]--George Burgess

## 2019-09-14 LAB — CULTURE, BLOOD (ROUTINE X 2): Culture: NO GROWTH

## 2019-09-27 ENCOUNTER — Encounter (HOSPITAL_COMMUNITY): Payer: Self-pay

## 2019-09-27 ENCOUNTER — Emergency Department (HOSPITAL_COMMUNITY): Payer: Medicare Other

## 2019-09-27 ENCOUNTER — Emergency Department (HOSPITAL_COMMUNITY)
Admission: EM | Admit: 2019-09-27 | Discharge: 2019-09-27 | Disposition: A | Discharge status: Home / Self Care | Payer: Medicare Other | Attending: Emergency Medicine | Admitting: Emergency Medicine

## 2019-09-27 DIAGNOSIS — Z20822 Contact with and (suspected) exposure to covid-19: Secondary | ICD-10-CM | POA: Diagnosis not present

## 2019-09-27 DIAGNOSIS — I5042 Chronic combined systolic (congestive) and diastolic (congestive) heart failure: Secondary | ICD-10-CM | POA: Diagnosis not present

## 2019-09-27 DIAGNOSIS — F0390 Unspecified dementia without behavioral disturbance: Secondary | ICD-10-CM | POA: Diagnosis not present

## 2019-09-27 DIAGNOSIS — R4182 Altered mental status, unspecified: Secondary | ICD-10-CM | POA: Diagnosis present

## 2019-09-27 DIAGNOSIS — Z87891 Personal history of nicotine dependence: Secondary | ICD-10-CM | POA: Diagnosis not present

## 2019-09-27 DIAGNOSIS — I110 Hypertensive heart disease with heart failure: Secondary | ICD-10-CM | POA: Diagnosis not present

## 2019-09-27 DIAGNOSIS — E11649 Type 2 diabetes mellitus with hypoglycemia without coma: Secondary | ICD-10-CM | POA: Diagnosis not present

## 2019-09-27 DIAGNOSIS — I25110 Atherosclerotic heart disease of native coronary artery with unstable angina pectoris: Secondary | ICD-10-CM | POA: Diagnosis not present

## 2019-09-27 LAB — CBC WITH DIFFERENTIAL/PLATELET
Abs Immature Granulocytes: 0.01 10*3/uL (ref 0.00–0.07)
Basophils Absolute: 0.1 10*3/uL (ref 0.0–0.1)
Basophils Relative: 1 %
Eosinophils Absolute: 0.2 10*3/uL (ref 0.0–0.5)
Eosinophils Relative: 3 %
HCT: 32.3 % — ABNORMAL LOW (ref 39.0–52.0)
Hemoglobin: 10.2 g/dL — ABNORMAL LOW (ref 13.0–17.0)
Immature Granulocytes: 0 %
Lymphocytes Relative: 11 %
Lymphs Abs: 0.7 10*3/uL (ref 0.7–4.0)
MCH: 30 pg (ref 26.0–34.0)
MCHC: 31.6 g/dL (ref 30.0–36.0)
MCV: 95 fL (ref 80.0–100.0)
Monocytes Absolute: 0.4 10*3/uL (ref 0.1–1.0)
Monocytes Relative: 7 %
Neutro Abs: 4.7 10*3/uL (ref 1.7–7.7)
Neutrophils Relative %: 78 %
Platelets: 150 10*3/uL (ref 150–400)
RBC: 3.4 MIL/uL — ABNORMAL LOW (ref 4.22–5.81)
RDW: 16 % — ABNORMAL HIGH (ref 11.5–15.5)
WBC: 6.1 10*3/uL (ref 4.0–10.5)
nRBC: 0 % (ref 0.0–0.2)

## 2019-09-27 LAB — COMPREHENSIVE METABOLIC PANEL
ALT: 20 U/L (ref 0–44)
AST: 31 U/L (ref 15–41)
Albumin: 3.3 g/dL — ABNORMAL LOW (ref 3.5–5.0)
Alkaline Phosphatase: 96 U/L (ref 38–126)
Anion gap: 16 — ABNORMAL HIGH (ref 5–15)
BUN: 53 mg/dL — ABNORMAL HIGH (ref 8–23)
CO2: 20 mmol/L — ABNORMAL LOW (ref 22–32)
Calcium: 9.5 mg/dL (ref 8.9–10.3)
Chloride: 97 mmol/L — ABNORMAL LOW (ref 98–111)
Creatinine, Ser: 6.96 mg/dL — ABNORMAL HIGH (ref 0.61–1.24)
GFR calc Af Amer: 8 mL/min — ABNORMAL LOW (ref >60)
GFR calc non Af Amer: 7 mL/min — ABNORMAL LOW (ref >60)
Glucose, Bld: 118 mg/dL — ABNORMAL HIGH (ref 70–99)
Potassium: 4.7 mmol/L (ref 3.5–5.1)
Sodium: 133 mmol/L — ABNORMAL LOW (ref 135–145)
Total Bilirubin: 1.2 mg/dL (ref 0.3–1.2)
Total Protein: 6.1 g/dL — ABNORMAL LOW (ref 6.5–8.1)

## 2019-09-27 LAB — LACTIC ACID, PLASMA: Lactic Acid, Venous: 2 mmol/L (ref 0.5–1.9)

## 2019-09-27 LAB — SARS CORONAVIRUS 2 BY RT PCR (HOSPITAL ORDER, PERFORMED IN ~~LOC~~ HOSPITAL LAB): SARS Coronavirus 2: NEGATIVE

## 2019-09-27 LAB — PROTIME-INR
INR: 1.4 — ABNORMAL HIGH (ref 0.8–1.2)
Prothrombin Time: 16.3 seconds — ABNORMAL HIGH (ref 11.4–15.2)

## 2019-09-27 NOTE — ED Triage Notes (Signed)
Pt BIB [facility] from [facility], c/o increasing confusion over past 2 days, refused last 2 dialysis treatments (MWF schedule).Pt sats 88% RA upon EMS arrival to facility, 100% RA att. VSS, Pt alert & oriented to person and place, d/o to time and situation, NAD noted

## 2019-09-27 NOTE — ED Notes (Signed)
Condom catheter applied, pt informed of need of urine sample.

## 2019-09-27 NOTE — ED Notes (Signed)
[facility] called @ 1850-per Zelphia Cairo, RN called by Levada Dy

## 2019-09-27 NOTE — Discharge Instructions (Signed)
Please ensure patient goes to his next dialysis session on Wednesday; gently remind him dialysis is important.  Return to ED for any worsening or concerning symptoms.

## 2019-09-27 NOTE — ED Provider Notes (Signed)
[facility] EMERGENCY DEPARTMENT Provider Note   CSN: [id number] Arrival date & time: 09/27/19  1548     History Chief Complaint  Patient presents with  . Altered Mental Status    George Burgess is a 69 y.o. male.  HPI  Patient presents to the ED via EMS for altered mental status.  The patient has a history of dementia, encephalopathy, and ESRD.  The patient has reportedly refused his last 2 dialysis sessions.  When asked states that he needed the rest and that is why he did not go to dialysis.  He denies any symptoms at this time.  Denies any pain.  Is somewhat confused on my examination and disoriented to time and situation.  Nursing facility reports his confusion has worsened over the last 2 days.  EMS found the patient to be 88% on room air when they arrived however on arrival here patient 100%.  Patient HPI information is limited due to patient's underlying dementia.     Past Medical History:  Diagnosis Date  . Atrial fibrillation ([place])    a. Chronic Eliquis (CHA2DS2VASc = 6).  . Chronic combined systolic (congestive) and diastolic (congestive) heart failure ([place])    a. Previously reported EF of 30%;  b. 08/2016 Echo: EF 40-45%, antsept, inf, infsept HK, Gr3 DD, mod AI/MR, sev dil LA, mild TR, PASP 8mmHg.  Marland Kitchen Dementia ([place])   . Encephalopathy   . End stage renal disease ([place])    a. On HD.  Marland Kitchen Epilepsy, unspecified, not intractable, without status epilepticus ([place]) 03/21/2018   provided from [facility] records  . Essential hypertension   . GIB (gastrointestinal bleeding)    a. 08/2016 in setting of Eliquis Rx.  Marland Kitchen Hyperlipidemia   . Pleural effusion   . PVD (peripheral vascular disease) ([place])    a. s/p L BKA;  b. Chronic LE wounds.  . Stroke ([place])   . Type II diabetes mellitus [facility])     Patient Active Problem List   Diagnosis Date Noted  . 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 Urinary tract infection without hematuria   .  Tachycardia   . Chest pain 07/13/2019  . Epilepsy ([place]) 07/13/2019  . Altered mental status   . Atrial flutter ([place])   . History of ST elevation myocardial infarction (STEMI) 06/20/2019  . Dementia ([place])   . Chronic combined systolic (congestive) and diastolic (congestive) heart failure ([place])   . PVD (peripheral vascular disease) ([place])   . History of stroke   . Seizure ([place]) 09/11/2017  . ESRD on hemodialysis ([place]) 09/11/2017  . Elevated troponin 09/11/2017  . Atrial fibrillation, chronic 09/11/2017  . 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[place]) 09/11/2017  . Essential hypertension 09/11/2017  . Hypoglycemia 09/11/2017  . Syncope 09/10/2017  . Blindness   . Bloody stools   . Encounter for nasogastric (NG) tube placement   . Lower GI bleed   . Malnutrition of moderate degree 09/05/2016  . Acute GI bleeding 09/04/2016    Past Surgical History:  Procedure Laterality Date  . BELOW KNEE LEG AMPUTATION Left   . CORONARY/GRAFT ACUTE MI REVASCULARIZATION N/A 06/20/2019   Procedure: Coronary/Graft Acute MI Revascularization;  Surgeon: Troy Sine, MD;  Location: [facility];  Service: Cardiovascular;  Laterality: N/A;  . FLEXIBLE SIGMOIDOSCOPY N/A 09/07/2016   Procedure: FLEXIBLE SIGMOIDOSCOPY;  Surgeon: Manus Gunning, MD;  Location: [facility];  Service: Gastroenterology;  Laterality: N/A;  .  LEFT HEART CATH AND CORONARY ANGIOGRAPHY N/A 06/20/2019   Procedure: LEFT HEART CATH AND CORONARY ANGIOGRAPHY;  Surgeon: Troy Sine, MD;  Location: [facility];  Service: Cardiovascular;  Laterality: N/A;       Family History  Problem Relation Age of Onset  . Other Mother        Pt unsure of PMH of family members.    Social History   Tobacco Use  . Smoking status: Former Research scientist (life sciences)  . Smokeless tobacco: Never Used  . Tobacco comment: Pt thinks he used to smoke cigarettes.  Thinks he quit many years ago.  Vaping Use  . Vaping Use: Never used  Substance Use  Topics  . Alcohol use: No  . Drug use: No    Home Medications Prior to Admission medications   Medication Sig Start Date End Date Taking? Authorizing Provider  acetaminophen (TYLENOL) 325 MG tablet Take 650 mg by mouth every 4 (four) hours as needed (for pain).     [provider]  aspirin 81 MG tablet Chew 81 mg by mouth daily.    [provider]  atorvastatin (LIPITOR) 80 MG tablet Take 1 tablet (80 mg total) by mouth daily at 6 PM. Patient taking differently: Take 80 mg by mouth at bedtime.  06/22/19   Kroeger, Lorelee Cover., PA-C  B Complex-C-Folic Acid (NEPHRO VITAMINS) 0.8 MG TABS Take 1 tablet by mouth daily.    [provider]  calcium carbonate (TUMS EX) 750 MG chewable tablet Chew 1 tablet by mouth daily with supper.     [provider]  ethyl chloride spray Apply 1 application topically every Monday, Wednesday, and Friday. Given at diaylasis    [provider]  famotidine (PEPCID) 20 MG tablet Take 20 mg by mouth daily.    [provider]  lamoTRIgine (LAMICTAL) 25 MG tablet Take 25 mg by mouth at bedtime.     [provider]  levETIRAcetam (KEPPRA) 500 MG tablet Take 1 tablet (500 mg total) by mouth 2 (two) times daily. 03/21/18   Danford, Suann Larry, MD  metoprolol succinate (TOPROL-XL) 25 MG 24 hr tablet Take 1 tablet (25 mg total) by mouth daily. 07/18/19 08/17/19  Lyndee Hensen, DO  nitroGLYCERIN (NITROSTAT) 0.4 MG SL tablet Place 0.4 mg under the tongue every 5 (five) minutes x 3 doses as needed for chest pain.     [provider]  Nutritional Supplements (FEEDING SUPPLEMENT, NEPRO CARB STEADY,) LIQD Take 237 mLs by mouth 2 (two) times daily between meals.     [provider]  ticagrelor (BRILINTA) 90 MG TABS tablet Take 1 tablet (90 mg total) by mouth 2 (two) times daily. 06/22/19   Kroeger, Lorelee Cover., PA-C    Allergies    Patient has no known allergies.  Review of Systems   Review of Systems    Constitutional: Negative for chills and fever.  HENT: Negative for ear pain and sore throat.   Eyes: Negative for pain and visual disturbance.  Respiratory: Negative for cough and shortness of breath.   Cardiovascular: Negative for chest pain and palpitations.  Gastrointestinal: Negative for abdominal pain and vomiting.  Genitourinary: Negative for dysuria and hematuria.  Musculoskeletal: Negative for arthralgias and back pain.  Skin: Negative for color change and rash.  Neurological: Negative for seizures and syncope.  Psychiatric/Behavioral: Positive for confusion.  All other systems reviewed and are negative.   Physical Exam Updated Vital Signs BP 127/83 (BP Location: Right Arm)  Pulse (!) 41   Temp 97.6 F (36.4 C) (Oral)   Resp 20   SpO2 99%   Physical Exam Vitals and nursing note reviewed.  Constitutional:      General: He is not in acute distress.    Appearance: Normal appearance. He is well-developed and normal weight. He is ill-appearing. He is not toxic-appearing.  HENT:     Head: Normocephalic and atraumatic.  Eyes:     Extraocular Movements: Extraocular movements intact.     Conjunctiva/sclera: Conjunctivae normal.     Pupils: Pupils are equal, round, and reactive to light.  Cardiovascular:     Rate and Rhythm: Normal rate and regular rhythm.     Heart sounds: No murmur heard.   Pulmonary:     Effort: Pulmonary effort is normal. No respiratory distress.     Breath sounds: Normal breath sounds.  Abdominal:     General: There is no distension.     Palpations: Abdomen is soft.     Tenderness: There is no abdominal tenderness.  Musculoskeletal:     Cervical back: Neck supple.  Skin:    General: Skin is warm and dry.     Capillary Refill: Capillary refill takes less than 2 seconds.  Neurological:     Mental Status: He is alert. He is disoriented and confused.     Cranial Nerves: No cranial nerve deficit or dysarthria.     Motor: No weakness, abnormal  muscle tone or seizure activity.  Psychiatric:        Mood and Affect: Mood normal.        Behavior: Behavior normal.     ED Results / Procedures / Treatments   Labs (all labs ordered are listed, but only abnormal results are displayed) Labs Reviewed  COMPREHENSIVE METABOLIC PANEL - Abnormal; Notable for the following components:      Result Value   Sodium 133 (*)    Chloride 97 (*)    CO2 20 (*)    Glucose, Bld 118 (*)    BUN 53 (*)    Creatinine, Ser 6.96 (*)    Total Protein 6.1 (*)    Albumin 3.3 (*)    GFR calc non Af Amer 7 (*)    GFR calc Af Amer 8 (*)    Anion gap 16 (*)    All other components within normal limits  LACTIC ACID, PLASMA - Abnormal; Notable for the following components:   Lactic Acid, Venous 2.0 (*)    All other components within normal limits  CBC WITH DIFFERENTIAL/PLATELET - Abnormal; Notable for the following components:   RBC 3.40 (*)    Hemoglobin 10.2 (*)    HCT 32.3 (*)    RDW 16.0 (*)    All other components within normal limits  PROTIME-INR - Abnormal; Notable for the following components:   Prothrombin Time 16.3 (*)    INR 1.4 (*)    All other components within normal limits  SARS CORONAVIRUS 2 BY RT PCR (HOSPITAL ORDER, [facility])  CULTURE, BLOOD (ROUTINE X 2)  CULTURE, BLOOD (ROUTINE X 2)    EKG EKG Interpretation  Date/Time:  Monday September 27 2019 17:24:23 EDT Ventricular Rate:  92 PR Interval:    QRS Duration: 159 QT Interval:  408 QTC Calculation: 425 R Axis:   -121 Text Interpretation: Atrial fibrillation Right bundle branch block Inferior infarct, old Confirmed by Virgel Manifold [id number]) on 09/27/2019 5:56:36 PM   Radiology DG Chest 1 View  Result Date: 09/27/2019 CLINICAL DATA:  Altered mental status with concern for pneumonia. EXAM: CHEST  1 VIEW COMPARISON:  September 09, 2019 FINDINGS: There is a well-positioned, stable tunneled dialysis catheter on the right. The heart size remains enlarged.  There is a moderate left-sided pleural effusion and a small right-sided pleural effusion. There is bilateral chronic pleural thickening and airspace opacities favored to represent atelectasis and or scarring. There is no pneumothorax. IMPRESSION: 1. Cardiomegaly with persistent bilateral pleural effusions. 2. Stable positioning of the tunneled dialysis catheter on the right. Electronically Signed   By: Constance Holster M.D.   On: 09/27/2019 16:55    Procedures Procedures (including critical care time)  Medications Ordered in ED Medications - No data to display  ED Course   George Burgess is a 69 y.o. male with PMHx listed that presents to the Emergency Department complaint of Altered Mental Status   ED Course: Initial exam completed.   Chronically ill-appearing but hemodynamically stable.  Nontoxic and afebrile.  Physical exam significant for 69 year old male with left fistula site, right tunneled catheter, extremities perfused, abdomen soft, nondistended, nontender, no tachypnea respiratory distress, clear breath sounds bilaterally.  Initial differential includes multiple sources of infection including cystitis, pyelonephritis, pneumonia, viral pneumonia, sepsis, bacteremia and other sources of altered mental status.  Given these concerns, sepsis order set initiated.   Extensive chart review shows the patient has a history of dialysis refusal and baseline altered mental status in the setting of dementia.  CXR with cardiomegaly and persistent bilateral pleural effusions.  CMP with no acute electrolyte abnormalities requiring urgent intervention and most notably normal potassium.  CBC without evidence of leukocytosis or leukopenia and stable hemoglobin although slightly anemic 10.2. INR reassuring. COVID-19 negative.  Overall, no concern for volume overload or electrolyte derangements today due to missed dialysis.  Suspect patient is at his neurologic baseline currently.  Patient will be  discharged back to facility and will remind staff at SNF to gently remind the patient dialysis is important and he should go to his appointments; next appointment on Wednesday.   Diagnostics Vital Signs: reviewed Labs: reviewed and significant findings discussed above Imaging: personally reviewed images interpreted by radiology EKG: reviewed Records: nursing notes along with previous records reviewed and pertinent data discussed   Consults:  none   Reevaluation/Disposition:  Upon reevaluation, patients symptoms stable. No active nausea/vomiting prior to discharge from the emergency department.    All questions answered.  Strict return precautions were discussed. Additionally we discussed establishing and/or following-up with primary care physician.  Patient and/or family was understanding and in agreement with today's assessment and plan.   Sherolyn Buba, MD Emergency Medicine, PGY-3   Note: Dragon medical dictation software was used in the creation of this note.   Final Clinical Impression(s) / ED Diagnoses Final diagnoses:  Altered mental status, unspecified altered mental status type    Rx / DC Orders ED Discharge Orders    None       Frann Rider, MD 09/27/19 2228    Virgel Manifold, MD 09/27/19 2309

## 2019-10-02 LAB — CULTURE, BLOOD (ROUTINE X 2)
Culture: NO GROWTH
Culture: NO GROWTH

## 2019-10-07 ENCOUNTER — Non-Acute Institutional Stay: Payer: Medicare Other | Admitting: Internal Medicine

## 2019-10-07 DIAGNOSIS — N186 End stage renal disease: Secondary | ICD-10-CM

## 2019-10-07 DIAGNOSIS — Z515 Encounter for palliative care: Secondary | ICD-10-CM

## 2019-10-07 DIAGNOSIS — Z992 Dependence on renal dialysis: Secondary | ICD-10-CM

## 2019-10-07 DIAGNOSIS — F0150 Vascular dementia without behavioral disturbance: Secondary | ICD-10-CM

## 2019-10-07 NOTE — Progress Notes (Signed)
[facility] Consult Note Telephone: [phone]  Fax: [phone]  PATIENT NAME: George Burgess DOB: [birth year] MRN: [id number]  PRIMARY CARE PROVIDER:  Dr Jules Husbands   REFERRING PROVIDER:  Dr. Sharol Roussel [place]/ [place]  RESPONSIBLE PARTY:   [place], [place] Brother   [phone]     RECOMMENDATIONS and PLAN:   Palliative care encounter  Z51.5 1. Advance care planning:  Limited explanation of Palliative care to patient.  No family at bedside.  Additional discussion will be given to responsible party due to decreased cognitive function of patient.  Discussion with patient related to his goals associated with his health.  He states that he wants to "do what he's supposed to do to get better" inclusive of going to dialysis.  Current code status is that of full code.  No MOST form completion noted.   Again, additional discussion will occur with his brother.    2.  ESRD on hemodialysis:  Intermittently compliant.  Encouraged patient to continue MWF treatments.  Very poor short and long term outcomes with extended refusal of dialysis.  Address plans and advanced directives with brother.   3.  Fatigue:  Related to poor nutritional intake, and advancement of multiple co-morbidities.  4.  Protein calorie malnutrition:  Encourage small frequent renal meals.  Continue Nepro supplemental beverages. Weights per dialysis clinic. Poor trajectory.  5.  Vascular Dementia: FAST stage 7.  Signs of cognitive and functional and cognitive decline noted.  Continue cueing and supportive care as needed.  Unable to make sound decisions related to health care needs.  Discuss specifics with brother.   I spent 35 minutes providing this consultation,  from 1300 to 1335. More than 50% of the time in this consultation was spent coordinating communication with patient and clinical staff.   HISTORY OF PRESENT ILLNESS:  George Burgess is a 69 y.o.  year old male with multiple medical problems including multiple cerebral infarcts, coronary artery disease, unstable angina, hx of MI, L BKA and ESRD on hemodialysis 3x/week. Clinical staff reports that patient has been frequently refusing to go to hemodialysis treatments requiring visits to local ER related to changes in mental status. Staff also reports decreased nutritional intake(< 50% of meals).  He requires assistance with ADLs.  Palliative Care was asked to help address goals of care.   CODE STATUS: FULL CODE  PPS: 40% weak HOSPICE ELIGIBILITY/DIAGNOSIS: TBD  PAST MEDICAL HISTORY:  Past Medical History:  Diagnosis Date  . Atrial fibrillation ([place])    a. Chronic Eliquis (CHA2DS2VASc = 6).  . Chronic combined systolic (congestive) and diastolic (congestive) heart failure ([place])    a. Previously reported EF of 30%;  b. 08/2016 Echo: EF 40-45%, antsept, inf, infsept HK, Gr3 DD, mod AI/MR, sev dil LA, mild TR, PASP 59mmHg.  Marland Kitchen Dementia ([place])   . Encephalopathy   . End stage renal disease ([place])    a. On HD.  Marland Kitchen Epilepsy, unspecified, not intractable, without status epilepticus ([place]) 03/21/2018   provided from [facility] records  . Essential hypertension   . GIB (gastrointestinal bleeding)    a. 08/2016 in setting of Eliquis Rx.  Marland Kitchen Hyperlipidemia   . Pleural effusion   . PVD (peripheral vascular disease) ([place])    a. s/p L BKA;  b. Chronic LE wounds.  . Stroke ([place])   . Type II diabetes mellitus ([place])     SOCIAL HX:  Social History   Tobacco Use  .  Smoking status: Former Research scientist (life sciences)  . Smokeless tobacco: Never Used  . Tobacco comment: Pt thinks he used to smoke cigarettes.  Thinks he quit many years ago.  Substance Use Topics  . Alcohol use: No    ALLERGIES: No Known Allergies   PERTINENT MEDICATIONS:  Outpatient Encounter Medications as of 10/07/2019  Medication Sig  . acetaminophen (TYLENOL) 325 MG tablet Take 650 mg by mouth every 4 (four) hours as needed (for pain).     Marland Kitchen aspirin 81 MG tablet Chew 81 mg by mouth daily.  Marland Kitchen atorvastatin (LIPITOR) 80 MG tablet Take 1 tablet (80 mg total) by mouth daily at 6 PM. (Patient taking differently: Take 80 mg by mouth at bedtime. )  . B Complex-C-Folic Acid (NEPHRO VITAMINS) 0.8 MG TABS Take 1 tablet by mouth daily.  . calcium carbonate (TUMS EX) 750 MG chewable tablet Chew 1 tablet by mouth daily with supper.   . ethyl chloride spray Apply 1 application topically every Monday, Wednesday, and Friday. Given at diaylasis  . famotidine (PEPCID) 20 MG tablet Take 20 mg by mouth daily.  Marland Kitchen lamoTRIgine (LAMICTAL) 25 MG tablet Take 25 mg by mouth at bedtime.   . levETIRAcetam (KEPPRA) 500 MG tablet Take 1 tablet (500 mg total) by mouth 2 (two) times daily.  . metoprolol succinate (TOPROL-XL) 25 MG 24 hr tablet Take 1 tablet (25 mg total) by mouth daily.  . nitroGLYCERIN (NITROSTAT) 0.4 MG SL tablet Place 0.4 mg under the tongue every 5 (five) minutes x 3 doses as needed for chest pain.   . Nutritional Supplements (FEEDING SUPPLEMENT, NEPRO CARB STEADY,) LIQD Take 237 mLs by mouth 2 (two) times daily between meals.   . ticagrelor (BRILINTA) 90 MG TABS tablet Take 1 tablet (90 mg total) by mouth 2 (two) times daily.   No facility-administered encounter medications on file as of 10/07/2019.    PHYSICAL EXAM:   General: NAD, frail and chronically ill appearing, thin elderly male reclined in bed Cardiovascular: regular rate and rhythm.  R subclavian vascular catheter in place. Pulmonary: Intermittently short of breath,  clear ant fields. Sats 96-97% on rm air Abdomen: soft, nontender, + bowel sounds Extremities: no edema, L AKA Skin: exposed skin is intact Neurological: somnolent but arouses easily. Oriented to person and place, Weakness  Gonzella Lex, NP-C

## 2019-10-08 ENCOUNTER — Other Ambulatory Visit: Payer: Self-pay

## 2019-12-27 ENCOUNTER — Telehealth: Payer: Self-pay

## 2019-12-27 NOTE — Telephone Encounter (Signed)
Pt with dementia, CAD, ESRD on dialysis with Hx of A fib, stroke was seen in the [facility] ER in 08-2019 for chest pain and dizziness. Pt has history of dialysis refusal. He was returned to his nursing facility and was told to follow with his PCP vs GI.  In August 2021 pt was admitted at [facility] for shortness of breath, altered mental status. Patient has a history of dialysis refusal and baseline altered mental status in the setting of dementia.  CXR with cardiomegaly and persistent bilateral pleural effusions. Pt was returned to his nursing facility, [facility] and encouraged about the importance of dialysis. It is unclear why patient was scheduled for follow up with GI.  [facility] [place] and left message for nurse to return call to discuss patient's appointment scheduled for this Wednesday, 11-10 at 9:00am with Dr. Havery Moros which was scheduled in Sept 2021 as follow up to July [facility] ER visit.

## 2019-12-28 NOTE — Telephone Encounter (Signed)
[facility] and left another message for nurse to call to discuss need for tomorrow's appt

## 2019-12-28 NOTE — Telephone Encounter (Signed)
Called and spoke to [place] at [facility]. She confirmed pt does NOT show an appt with [place] tomorrow, 11-10 but he does have a dialysis appt at 10:00am tomorrow.  I will cancel his appt with Dr. Havery Moros and Francene Finders assured me she would relay this to the staff in the morning.

## 2019-12-29 ENCOUNTER — Ambulatory Visit: Payer: Medicare Other | Admitting: Gastroenterology

## 2020-05-03 ENCOUNTER — Telehealth: Payer: Self-pay | Admitting: Oncology

## 2020-05-03 NOTE — Telephone Encounter (Signed)
Received a new pt referral from [facility] for hx of prostate cancer w/mets to spine, rib and pelvis. [name] has been scheduled to see Dr. Alen Blew on 3/24 at 11am. Appt date and time has been given to [place], Animator at [facility].

## 2020-05-11 ENCOUNTER — Other Ambulatory Visit: Payer: Self-pay

## 2020-05-11 ENCOUNTER — Inpatient Hospital Stay: Payer: Medicare Other | Attending: Oncology | Admitting: Oncology

## 2020-05-11 VITALS — BP 123/75 | HR 67 | Temp 97.2°F | Resp 16 | Ht 72.0 in

## 2020-05-11 DIAGNOSIS — E1122 Type 2 diabetes mellitus with diabetic chronic kidney disease: Secondary | ICD-10-CM

## 2020-05-11 DIAGNOSIS — Z7901 Long term (current) use of anticoagulants: Secondary | ICD-10-CM | POA: Diagnosis not present

## 2020-05-11 DIAGNOSIS — C61 Malignant neoplasm of prostate: Secondary | ICD-10-CM | POA: Insufficient documentation

## 2020-05-11 DIAGNOSIS — I132 Hypertensive heart and chronic kidney disease with heart failure and with stage 5 chronic kidney disease, or end stage renal disease: Secondary | ICD-10-CM | POA: Diagnosis not present

## 2020-05-11 DIAGNOSIS — I4891 Unspecified atrial fibrillation: Secondary | ICD-10-CM

## 2020-05-11 DIAGNOSIS — Z992 Dependence on renal dialysis: Secondary | ICD-10-CM | POA: Diagnosis not present

## 2020-05-11 DIAGNOSIS — N186 End stage renal disease: Secondary | ICD-10-CM | POA: Diagnosis not present

## 2020-05-11 NOTE — Progress Notes (Signed)
Reason for the request:    Prostate cancer  HPI: I was asked by Dr. Judi Cong to evaluate Mr. Sudol for the diagnosis of presumed prostate cancer.  He is a 70 year old man with history of atrial fibrillation, end-stage renal disease currently on dialysis among other comorbid conditions.  He currently resides at a skilled nursing facility.  He was found to have an elevated PSA of 71 during recent hospitalization.  CT abdomen and pelvis obtained on March 21, 2020 showed sclerotic lesion in the spine pelvis and proximal left femur.  No lymphadenopathy was noted at that time.  He was evaluated by Dr. Venia Minks from urology in [facility] and repeated his PSA at that time and PSA on May 02, 2020 was 92.4.  He is under consideration for prostate biopsy at this time.  Clinically, he reports feeling well without any major complaints.  He denies any bone pain or pathological fractures.  His mobility is limited due to peripheral vascular disease and previous amputation.  He has for quality of life overall limited performance status.  He does not report any headaches, blurry vision, syncope or seizures. Does not report any fevers, chills or sweats.  Does not report any cough, wheezing or hemoptysis.  Does not report any chest pain, palpitation, orthopnea or leg edema.  Does not report any nausea, vomiting or abdominal pain.  Does not report any constipation or diarrhea.  Does not report any skeletal complaints.    Does not report frequency, urgency or hematuria.  Does not report any skin rashes or lesions. Does not report any heat or cold intolerance.  Does not report any lymphadenopathy or petechiae.  Does not report any anxiety or depression.  Remaining review of systems is negative.    Past Medical History:  Diagnosis Date  . Atrial fibrillation ([place])    a. Chronic Eliquis (CHA2DS2VASc = 6).  . Chronic combined systolic (congestive) and diastolic (congestive) heart failure ([place])    a. Previously reported EF  of 30%;  b. 08/2016 Echo: EF 40-45%, antsept, inf, infsept HK, Gr3 DD, mod AI/MR, sev dil LA, mild TR, PASP 31mmHg.  Marland Kitchen Dementia ([place])   . Encephalopathy   . End stage renal disease ([place])    a. On HD.  Marland Kitchen Epilepsy, unspecified, not intractable, without status epilepticus ([place]) 03/21/2018   provided from [facility] records  . Essential hypertension   . GIB (gastrointestinal bleeding)    a. 08/2016 in setting of Eliquis Rx.  Marland Kitchen Hyperlipidemia   . Pleural effusion   . PVD (peripheral vascular disease) ([place])    a. s/p L BKA;  b. Chronic LE wounds.  . Stroke ([place])   . Type II diabetes mellitus ([place])   :  Past Surgical History:  Procedure Laterality Date  . BELOW KNEE LEG AMPUTATION Left   . CORONARY/GRAFT ACUTE MI REVASCULARIZATION N/A 06/20/2019   Procedure: Coronary/Graft Acute MI Revascularization;  Surgeon: Troy Sine, MD;  Location: [facility];  Service: Cardiovascular;  Laterality: N/A;  . FLEXIBLE SIGMOIDOSCOPY N/A 09/07/2016   Procedure: FLEXIBLE SIGMOIDOSCOPY;  Surgeon: Manus Gunning, MD;  Location: [facility];  Service: Gastroenterology;  Laterality: N/A;  . LEFT HEART CATH AND CORONARY ANGIOGRAPHY N/A 06/20/2019   Procedure: LEFT HEART CATH AND CORONARY ANGIOGRAPHY;  Surgeon: Troy Sine, MD;  Location: [facility];  Service: Cardiovascular;  Laterality: N/A;  :   Current Outpatient Medications:  .  acetaminophen (TYLENOL) 325 MG tablet, Take 650 mg  by mouth every 4 (four) hours as needed (for pain). , Disp: , Rfl:  .  aspirin 81 MG tablet, Chew 81 mg by mouth daily., Disp: , Rfl:  .  atorvastatin (LIPITOR) 80 MG tablet, Take 1 tablet (80 mg total) by mouth daily at 6 PM. (Patient taking differently: Take 80 mg by mouth at bedtime. ), Disp: 90 tablet, Rfl: 3 .  B Complex-C-Folic Acid (NEPHRO VITAMINS) 0.8 MG TABS, Take 1 tablet by mouth daily., Disp: , Rfl:  .  calcium carbonate (TUMS EX) 750 MG chewable tablet, Chew 1 tablet by mouth  daily with supper. , Disp: , Rfl:  .  ethyl chloride spray, Apply 1 application topically every Monday, Wednesday, and Friday. Given at diaylasis, Disp: , Rfl:  .  famotidine (PEPCID) 20 MG tablet, Take 20 mg by mouth daily., Disp: , Rfl:  .  lamoTRIgine (LAMICTAL) 25 MG tablet, Take 25 mg by mouth at bedtime. , Disp: , Rfl:  .  levETIRAcetam (KEPPRA) 500 MG tablet, Take 1 tablet (500 mg total) by mouth 2 (two) times daily., Disp: 60 tablet, Rfl: 3 .  metoprolol succinate (TOPROL-XL) 25 MG 24 hr tablet, Take 1 tablet (25 mg total) by mouth daily., Disp: 30 tablet, Rfl: 0 .  nitroGLYCERIN (NITROSTAT) 0.4 MG SL tablet, Place 0.4 mg under the tongue every 5 (five) minutes x 3 doses as needed for chest pain. , Disp: , Rfl:  .  Nutritional Supplements (FEEDING SUPPLEMENT, NEPRO CARB STEADY,) LIQD, Take 237 mLs by mouth 2 (two) times daily between meals. , Disp: , Rfl:  .  ticagrelor (BRILINTA) 90 MG TABS tablet, Take 1 tablet (90 mg total) by mouth 2 (two) times daily., Disp: 180 tablet, Rfl: 3:  No Known Allergies:  Family History  Problem Relation Age of Onset  . Other Mother        Pt unsure of PMH of family members.  :  Social History   Socioeconomic History  . Marital status: Single    Spouse name: Not on file  . Number of children: Not on file  . Years of education: Not on file  . Highest education level: Not on file  Occupational History  . Occupation: retired/disabled  Tobacco Use  . Smoking status: Former Research scientist (life sciences)  . Smokeless tobacco: Never Used  . Tobacco comment: Pt thinks he used to smoke cigarettes.  Thinks he quit many years ago.  Vaping Use  . Vaping Use: Never used  Substance and Sexual Activity  . Alcohol use: No  . Drug use: No  . Sexual activity: Never  Other Topics Concern  . Not on file  Social History Narrative   Lives @ [facility].  Sedentary.   Social Determinants of Health   Financial Resource Strain: Not on file  Food Insecurity: Not on file   Transportation Needs: Not on file  Physical Activity: Not on file  Stress: Not on file  Social Connections: Not on file  Intimate Partner Violence: Not on file  :  Pertinent items are noted in HPI.  Exam: Blood pressure 123/75, pulse 67, temperature (!) 97.2 F (36.2 C), temperature source Tympanic, resp. rate 16, height 6' (1.829 m).  ECOG 3  General appearance: alert and cooperative appeared without distress. Head: atraumatic without any abnormalities. Eyes: conjunctivae/corneas clear. PERRL.  Sclera anicteric. Throat: lips, mucosa, and tongue normal; without oral thrush or ulcers. Resp: clear to auscultation bilaterally without rhonchi, wheezes or dullness to percussion. Cardio: regular rate and rhythm, S1,  S2 normal, no murmur, click, rub or gallop GI: soft, non-tender; bowel sounds normal; no masses,  no organomegaly Skin: Skin color, texture, turgor normal. No rashes or lesions Lymph nodes: Cervical, supraclavicular, and axillary nodes normal. Neurologic: Grossly normal without any motor, sensory or deep tendon reflexes. Musculoskeletal: Left amputation noted.   Assessment and Plan:   70 year old with:  1.  Presumed advanced prostate cancer presenting with PSA of 92.4 and sclerotic bony lesions documented on CT scan February 2022.  Prostate biopsy has not been completed at this time.  His disease status was updated at this time and the natural course of this disease was reviewed.  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  The treatment options at this time were reviewed.  The backbone of treating as such disease include androgen deprivation therapy for lifetime.  Therapy escalation utilizing abiraterone, enzalutamide, systemic chemotherapy will also be considered after androgen deprivation has been started and his diagnosis is completed.  He understands her treatment at this time is palliative and given his poor health he  is overall prognosis is guarded.  At this time, I recommend proceeding with an deprivation therapy with normal and consideration for additional therapy in the future.   2.  Androgen deprivation therapy: I presume we will start under the care of Dr. Venia Minks in the near future and will continue indefinitely.   3.  Bone directed therapy: I recommended calcium and vitamin D supplements and Xgeva in the future.   4.  Follow-up: In 6 months to follow his progress.  60  minutes were dedicated to this visit. The time was spent on reviewing laboratory data, imaging studies, discussing treatment options, discussing differential diagnosis and answering questions regarding future plan.     A copy of this consult has been forwarded to the requesting physician.

## 2020-10-09 ENCOUNTER — Emergency Department (HOSPITAL_COMMUNITY): Payer: Medicare Other

## 2020-10-09 ENCOUNTER — Inpatient Hospital Stay (HOSPITAL_COMMUNITY)
Admission: EM | Admit: 2020-10-09 | Discharge: 2020-10-13 | DRG: 432 | Disposition: A | Discharge status: Skilled Nursing Facility | Payer: Medicare Other | Source: Skilled Nursing Facility | Attending: Internal Medicine | Admitting: Internal Medicine

## 2020-10-09 ENCOUNTER — Other Ambulatory Visit: Payer: Self-pay

## 2020-10-09 ENCOUNTER — Encounter (HOSPITAL_COMMUNITY): Payer: Self-pay

## 2020-10-09 DIAGNOSIS — K920 Hematemesis: Secondary | ICD-10-CM | POA: Diagnosis present

## 2020-10-09 DIAGNOSIS — D631 Anemia in chronic kidney disease: Secondary | ICD-10-CM | POA: Diagnosis present

## 2020-10-09 DIAGNOSIS — E1122 Type 2 diabetes mellitus with diabetic chronic kidney disease: Secondary | ICD-10-CM | POA: Diagnosis present

## 2020-10-09 DIAGNOSIS — I8510 Secondary esophageal varices without bleeding: Secondary | ICD-10-CM | POA: Diagnosis present

## 2020-10-09 DIAGNOSIS — I429 Cardiomyopathy, unspecified: Secondary | ICD-10-CM | POA: Diagnosis present

## 2020-10-09 DIAGNOSIS — R188 Other ascites: Secondary | ICD-10-CM | POA: Diagnosis present

## 2020-10-09 DIAGNOSIS — Z8673 Personal history of transient ischemic attack (TIA), and cerebral infarction without residual deficits: Secondary | ICD-10-CM

## 2020-10-09 DIAGNOSIS — J90 Pleural effusion, not elsewhere classified: Secondary | ICD-10-CM | POA: Diagnosis present

## 2020-10-09 DIAGNOSIS — J9601 Acute respiratory failure with hypoxia: Secondary | ICD-10-CM | POA: Diagnosis present

## 2020-10-09 DIAGNOSIS — K766 Portal hypertension: Secondary | ICD-10-CM | POA: Diagnosis present

## 2020-10-09 DIAGNOSIS — E1151 Type 2 diabetes mellitus with diabetic peripheral angiopathy without gangrene: Secondary | ICD-10-CM | POA: Diagnosis present

## 2020-10-09 DIAGNOSIS — F0390 Unspecified dementia without behavioral disturbance: Secondary | ICD-10-CM | POA: Diagnosis present

## 2020-10-09 DIAGNOSIS — Z992 Dependence on renal dialysis: Secondary | ICD-10-CM

## 2020-10-09 DIAGNOSIS — J9811 Atelectasis: Secondary | ICD-10-CM | POA: Diagnosis present

## 2020-10-09 DIAGNOSIS — I482 Chronic atrial fibrillation, unspecified: Secondary | ICD-10-CM | POA: Diagnosis present

## 2020-10-09 DIAGNOSIS — K7460 Unspecified cirrhosis of liver: Secondary | ICD-10-CM | POA: Diagnosis present

## 2020-10-09 DIAGNOSIS — I132 Hypertensive heart and chronic kidney disease with heart failure and with stage 5 chronic kidney disease, or end stage renal disease: Secondary | ICD-10-CM | POA: Diagnosis present

## 2020-10-09 DIAGNOSIS — Z7901 Long term (current) use of anticoagulants: Secondary | ICD-10-CM

## 2020-10-09 DIAGNOSIS — Z7902 Long term (current) use of antithrombotics/antiplatelets: Secondary | ICD-10-CM

## 2020-10-09 DIAGNOSIS — Z89512 Acquired absence of left leg below knee: Secondary | ICD-10-CM

## 2020-10-09 DIAGNOSIS — M898X9 Other specified disorders of bone, unspecified site: Secondary | ICD-10-CM | POA: Diagnosis present

## 2020-10-09 DIAGNOSIS — K7290 Hepatic failure, unspecified without coma: Secondary | ICD-10-CM | POA: Diagnosis present

## 2020-10-09 DIAGNOSIS — C61 Malignant neoplasm of prostate: Secondary | ICD-10-CM | POA: Diagnosis present

## 2020-10-09 DIAGNOSIS — Z79899 Other long term (current) drug therapy: Secondary | ICD-10-CM

## 2020-10-09 DIAGNOSIS — Z20822 Contact with and (suspected) exposure to covid-19: Secondary | ICD-10-CM | POA: Diagnosis present

## 2020-10-09 DIAGNOSIS — C7951 Secondary malignant neoplasm of bone: Secondary | ICD-10-CM | POA: Diagnosis present

## 2020-10-09 DIAGNOSIS — N186 End stage renal disease: Secondary | ICD-10-CM | POA: Diagnosis present

## 2020-10-09 DIAGNOSIS — E785 Hyperlipidemia, unspecified: Secondary | ICD-10-CM | POA: Diagnosis present

## 2020-10-09 DIAGNOSIS — Z87891 Personal history of nicotine dependence: Secondary | ICD-10-CM

## 2020-10-09 DIAGNOSIS — I272 Pulmonary hypertension, unspecified: Secondary | ICD-10-CM | POA: Diagnosis present

## 2020-10-09 DIAGNOSIS — R627 Adult failure to thrive: Secondary | ICD-10-CM | POA: Diagnosis present

## 2020-10-09 DIAGNOSIS — I2510 Atherosclerotic heart disease of native coronary artery without angina pectoris: Secondary | ICD-10-CM | POA: Diagnosis present

## 2020-10-09 DIAGNOSIS — I5042 Chronic combined systolic (congestive) and diastolic (congestive) heart failure: Secondary | ICD-10-CM | POA: Diagnosis present

## 2020-10-09 DIAGNOSIS — G40909 Epilepsy, unspecified, not intractable, without status epilepticus: Secondary | ICD-10-CM | POA: Diagnosis present

## 2020-10-09 DIAGNOSIS — F0150 Vascular dementia without behavioral disturbance: Secondary | ICD-10-CM | POA: Diagnosis not present

## 2020-10-09 DIAGNOSIS — Z7982 Long term (current) use of aspirin: Secondary | ICD-10-CM

## 2020-10-09 DIAGNOSIS — I25119 Atherosclerotic heart disease of native coronary artery with unspecified angina pectoris: Secondary | ICD-10-CM | POA: Diagnosis present

## 2020-10-09 DIAGNOSIS — Z7189 Other specified counseling: Secondary | ICD-10-CM | POA: Diagnosis not present

## 2020-10-09 DIAGNOSIS — Z515 Encounter for palliative care: Secondary | ICD-10-CM | POA: Diagnosis not present

## 2020-10-09 DIAGNOSIS — I10 Essential (primary) hypertension: Secondary | ICD-10-CM | POA: Diagnosis present

## 2020-10-09 DIAGNOSIS — Z9889 Other specified postprocedural states: Secondary | ICD-10-CM

## 2020-10-09 LAB — COMPREHENSIVE METABOLIC PANEL
ALT: 23 U/L (ref 0–44)
AST: 35 U/L (ref 15–41)
Albumin: 2.9 g/dL — ABNORMAL LOW (ref 3.5–5.0)
Alkaline Phosphatase: 164 U/L — ABNORMAL HIGH (ref 38–126)
Anion gap: 13 (ref 5–15)
BUN: 33 mg/dL — ABNORMAL HIGH (ref 8–23)
CO2: 29 mmol/L (ref 22–32)
Calcium: 8.9 mg/dL (ref 8.9–10.3)
Chloride: 95 mmol/L — ABNORMAL LOW (ref 98–111)
Creatinine, Ser: 4.94 mg/dL — ABNORMAL HIGH (ref 0.61–1.24)
GFR, Estimated: 12 mL/min — ABNORMAL LOW (ref >60)
Glucose, Bld: 96 mg/dL (ref 70–99)
Potassium: 4.6 mmol/L (ref 3.5–5.1)
Sodium: 137 mmol/L (ref 135–145)
Total Bilirubin: 0.9 mg/dL (ref 0.3–1.2)
Total Protein: 6.6 g/dL (ref 6.5–8.1)

## 2020-10-09 LAB — CBC WITH DIFFERENTIAL/PLATELET
Abs Immature Granulocytes: 0.02 10*3/uL (ref 0.00–0.07)
Basophils Absolute: 0 10*3/uL (ref 0.0–0.1)
Basophils Relative: 1 %
Eosinophils Absolute: 0.3 10*3/uL (ref 0.0–0.5)
Eosinophils Relative: 4 %
HCT: 33 % — ABNORMAL LOW (ref 39.0–52.0)
Hemoglobin: 10.6 g/dL — ABNORMAL LOW (ref 13.0–17.0)
Immature Granulocytes: 0 %
Lymphocytes Relative: 10 %
Lymphs Abs: 0.8 10*3/uL (ref 0.7–4.0)
MCH: 30.8 pg (ref 26.0–34.0)
MCHC: 32.1 g/dL (ref 30.0–36.0)
MCV: 95.9 fL (ref 80.0–100.0)
Monocytes Absolute: 0.7 10*3/uL (ref 0.1–1.0)
Monocytes Relative: 9 %
Neutro Abs: 6.2 10*3/uL (ref 1.7–7.7)
Neutrophils Relative %: 76 %
Platelets: 177 10*3/uL (ref 150–400)
RBC: 3.44 MIL/uL — ABNORMAL LOW (ref 4.22–5.81)
RDW: 15.1 % (ref 11.5–15.5)
WBC: 8.1 10*3/uL (ref 4.0–10.5)
nRBC: 0 % (ref 0.0–0.2)

## 2020-10-09 LAB — I-STAT CHEM 8, ED
BUN: 34 mg/dL — ABNORMAL HIGH (ref 8–23)
Calcium, Ion: 1.09 mmol/L — ABNORMAL LOW (ref 1.15–1.40)
Chloride: 95 mmol/L — ABNORMAL LOW (ref 98–111)
Creatinine, Ser: 4.7 mg/dL — ABNORMAL HIGH (ref 0.61–1.24)
Glucose, Bld: 92 mg/dL (ref 70–99)
HCT: 38 % — ABNORMAL LOW (ref 39.0–52.0)
Hemoglobin: 12.9 g/dL — ABNORMAL LOW (ref 13.0–17.0)
Potassium: 4.5 mmol/L (ref 3.5–5.1)
Sodium: 137 mmol/L (ref 135–145)
TCO2: 32 mmol/L (ref 22–32)

## 2020-10-09 LAB — HIV ANTIBODY (ROUTINE TESTING W REFLEX): HIV Screen 4th Generation wRfx: NONREACTIVE

## 2020-10-09 LAB — SARS CORONAVIRUS 2 (TAT 6-24 HRS): SARS Coronavirus 2: NEGATIVE

## 2020-10-09 LAB — HEMOGLOBIN A1C
Hgb A1c MFr Bld: 6.6 % — ABNORMAL HIGH (ref 4.8–5.6)
Mean Plasma Glucose: 142.72 mg/dL

## 2020-10-09 LAB — LIPASE, BLOOD: Lipase: 48 U/L (ref 11–51)

## 2020-10-09 LAB — GLUCOSE, CAPILLARY: Glucose-Capillary: 91 mg/dL (ref 70–99)

## 2020-10-09 LAB — PROCALCITONIN: Procalcitonin: 0.78 ng/mL

## 2020-10-09 MED ORDER — RENA-VITE PO TABS
1.0000 | ORAL_TABLET | Freq: Every day | ORAL | Status: DC
  Administered 2020-10-10 – 2020-10-11 (×2): 1 via ORAL
  Filled 2020-10-09 (×6): qty 1

## 2020-10-09 MED ORDER — MIDODRINE HCL 5 MG PO TABS
5.0000 mg | ORAL_TABLET | Freq: Three times a day (TID) | ORAL | Status: DC
  Administered 2020-10-09 – 2020-10-11 (×5): 5 mg via ORAL
  Filled 2020-10-09 (×5): qty 1

## 2020-10-09 MED ORDER — FERROUS GLUCONATE 324 (38 FE) MG PO TABS
324.0000 mg | ORAL_TABLET | Freq: Every day | ORAL | Status: DC
  Administered 2020-10-10 – 2020-10-11 (×2): 324 mg via ORAL
  Filled 2020-10-09 (×5): qty 1

## 2020-10-09 MED ORDER — ACETAMINOPHEN 325 MG PO TABS
650.0000 mg | ORAL_TABLET | Freq: Four times a day (QID) | ORAL | Status: DC | PRN
  Administered 2020-10-10: 650 mg via ORAL
  Filled 2020-10-09: qty 2

## 2020-10-09 MED ORDER — METOPROLOL SUCCINATE ER 25 MG PO TB24: 12.5000 mg | ORAL_TABLET | Freq: Every day | ORAL | Status: DC | PRN

## 2020-10-09 MED ORDER — LEVETIRACETAM 500 MG PO TABS
500.0000 mg | ORAL_TABLET | Freq: Two times a day (BID) | ORAL | Status: DC
  Administered 2020-10-09 – 2020-10-13 (×8): 500 mg via ORAL
  Filled 2020-10-09 (×10): qty 1

## 2020-10-09 MED ORDER — NITROGLYCERIN 0.4 MG SL SUBL: 0.4000 mg | SUBLINGUAL_TABLET | SUBLINGUAL | Status: DC | PRN

## 2020-10-09 MED ORDER — SODIUM CHLORIDE 0.9 % IV SOLN
1.0000 g | INTRAVENOUS | Status: DC
  Administered 2020-10-10 – 2020-10-11 (×2): 1 g via INTRAVENOUS
  Filled 2020-10-09 (×2): qty 10

## 2020-10-09 MED ORDER — DOCUSATE SODIUM 100 MG PO CAPS
100.0000 mg | ORAL_CAPSULE | Freq: Every day | ORAL | Status: DC
  Administered 2020-10-09 – 2020-10-10 (×2): 100 mg via ORAL
  Filled 2020-10-09 (×3): qty 1

## 2020-10-09 MED ORDER — ATORVASTATIN CALCIUM 80 MG PO TABS
80.0000 mg | ORAL_TABLET | Freq: Every day | ORAL | Status: DC
  Administered 2020-10-09 – 2020-10-13 (×5): 80 mg via ORAL
  Filled 2020-10-09 (×6): qty 1

## 2020-10-09 MED ORDER — SODIUM CHLORIDE 0.9% FLUSH: 3.0000 mL | INTRAVENOUS | Status: DC | PRN

## 2020-10-09 MED ORDER — NEPRO/CARBSTEADY PO LIQD
237.0000 mL | Freq: Two times a day (BID) | ORAL | Status: DC
  Administered 2020-10-10 – 2020-10-13 (×3): 237 mL via ORAL
  Filled 2020-10-09: qty 237

## 2020-10-09 MED ORDER — PANTOPRAZOLE SODIUM 40 MG IV SOLR
40.0000 mg | Freq: Once | INTRAVENOUS | Status: AC
  Administered 2020-10-09: 40 mg via INTRAVENOUS
  Filled 2020-10-09: qty 40

## 2020-10-09 MED ORDER — SODIUM CHLORIDE 0.9 % IV SOLN
500.0000 mg | INTRAVENOUS | Status: DC
  Administered 2020-10-10: 500 mg via INTRAVENOUS
  Filled 2020-10-09 (×2): qty 500

## 2020-10-09 MED ORDER — ASPIRIN 81 MG PO CHEW
81.0000 mg | CHEWABLE_TABLET | Freq: Every day | ORAL | Status: DC
  Administered 2020-10-09: 81 mg via ORAL
  Filled 2020-10-09: qty 1

## 2020-10-09 MED ORDER — SODIUM CHLORIDE 0.9 % IV SOLN: 250.0000 mL | INTRAVENOUS | Status: DC | PRN

## 2020-10-09 MED ORDER — FAMOTIDINE 20 MG PO TABS
20.0000 mg | ORAL_TABLET | Freq: Every day | ORAL | Status: DC
  Administered 2020-10-09: 20 mg via ORAL
  Filled 2020-10-09: qty 1

## 2020-10-09 MED ORDER — INSULIN ASPART 100 UNIT/ML IJ SOLN
0.0000 [IU] | Freq: Three times a day (TID) | INTRAMUSCULAR | Status: DC
  Administered 2020-10-11: 1 [IU] via SUBCUTANEOUS

## 2020-10-09 MED ORDER — ACETAMINOPHEN 650 MG RE SUPP: 650.0000 mg | Freq: Four times a day (QID) | RECTAL | Status: DC | PRN

## 2020-10-09 MED ORDER — SODIUM CHLORIDE 0.9 % IV SOLN
1.0000 g | Freq: Once | INTRAVENOUS | Status: AC
  Administered 2020-10-09: 1 g via INTRAVENOUS
  Filled 2020-10-09: qty 10

## 2020-10-09 MED ORDER — CHLORHEXIDINE GLUCONATE CLOTH 2 % EX PADS
6.0000 | MEDICATED_PAD | Freq: Every day | CUTANEOUS | Status: DC
  Administered 2020-10-10 – 2020-10-13 (×3): 6 via TOPICAL

## 2020-10-09 MED ORDER — SODIUM CHLORIDE 0.9 % IV SOLN
500.0000 mg | Freq: Once | INTRAVENOUS | Status: AC
  Administered 2020-10-09: 500 mg via INTRAVENOUS
  Filled 2020-10-09: qty 500

## 2020-10-09 MED ORDER — PANTOPRAZOLE SODIUM 40 MG IV SOLR
40.0000 mg | INTRAVENOUS | Status: DC
  Administered 2020-10-10 – 2020-10-11 (×2): 40 mg via INTRAVENOUS
  Filled 2020-10-09 (×2): qty 40

## 2020-10-09 MED ORDER — CLOPIDOGREL BISULFATE 75 MG PO TABS
75.0000 mg | ORAL_TABLET | Freq: Every day | ORAL | Status: DC
  Administered 2020-10-09: 75 mg via ORAL
  Filled 2020-10-09: qty 1

## 2020-10-09 MED ORDER — GABAPENTIN 100 MG PO CAPS
100.0000 mg | ORAL_CAPSULE | Freq: Two times a day (BID) | ORAL | Status: DC
  Administered 2020-10-09: 100 mg via ORAL
  Administered 2020-10-10: 200 mg via ORAL
  Administered 2020-10-10: 100 mg via ORAL
  Administered 2020-10-11 (×2): 200 mg via ORAL
  Filled 2020-10-09 (×3): qty 2
  Filled 2020-10-09 (×2): qty 1
  Filled 2020-10-09: qty 2

## 2020-10-09 MED ORDER — SODIUM CHLORIDE 0.9% FLUSH
3.0000 mL | Freq: Two times a day (BID) | INTRAVENOUS | Status: DC
  Administered 2020-10-09 – 2020-10-13 (×10): 3 mL via INTRAVENOUS

## 2020-10-09 NOTE — ED Provider Notes (Signed)
[facility] EMERGENCY DEPARTMENT Provider Note   CSN: [id number] Arrival date & time: 10/09/20  7591     History Chief Complaint  Patient presents with   Hematemesis    George Burgess is a 70 y.o. male.  Patient is a 70 year old male with a history of atrial fibrillation on Eliquis, CHF, dementia, end-stage renal disease on dialysis, hypertension, diabetes, prior stroke.  He presents from [facility] with vomiting.  He had several episodes of dark tarry looking emesis per the nursing home.  This started this morning.  He is also had a distended abdomen.  He has been coughing since this morning.  There is question of aspiration.  His blood pressure was stable with EMS.  He was noted to have some mild hypotension in the ED with a blood pressure of 96/62.  He initially was having normal oxygen saturations but during a coughing spell, dropped into the 80s.  He was placed on nasal cannula by EMS.  Patient currently denies any abdominal pain although his history is limited due to his dementia.      Past Medical History:  Diagnosis Date   Atrial fibrillation ([place])    a. Chronic Eliquis (CHA2DS2VASc = 6).   Chronic combined systolic (congestive) and diastolic (congestive) heart failure ([place])    a. Previously reported EF of 30%;  b. 08/2016 Echo: EF 40-45%, antsept, inf, infsept HK, Gr3 DD, mod AI/MR, sev dil LA, mild TR, PASP 54mmHg.   Dementia ([place])    Encephalopathy    End stage renal disease ([place])    a. On HD.   Epilepsy, unspecified, not intractable, without status epilepticus ([place]) 03/21/2018   provided from [facility] records   Essential hypertension    GIB (gastrointestinal bleeding)    a. 08/2016 in setting of Eliquis Rx.   Hyperlipidemia    Pleural effusion    PVD (peripheral vascular disease) ([place])    a. s/p L BKA;  b. Chronic LE wounds.   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)    Urinary tract infection without hematuria    Tachycardia    Chest pain 07/13/2019   Epilepsy ([place]) 07/13/2019   Altered mental status    Atrial flutter ([place])    History of ST elevation myocardial infarction (STEMI) 06/20/2019   Dementia (HCC)    Chronic combined systolic (congestive) and diastolic (congestive) heart failure (HCC)    PVD (peripheral vascular disease) ([place])    History of stroke    Seizure ([place]) 09/11/2017   ESRD on hemodialysis ([place]) 09/11/2017   Elevated troponin 09/11/2017   Atrial fibrillation, chronic 09/11/2017   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[place]) 09/11/2017   Essential hypertension 09/11/2017   Hypoglycemia 09/11/2017   Syncope 09/10/2017   Blindness    Bloody stools    Encounter for nasogastric (NG) tube placement    Lower GI bleed    Malnutrition of moderate degree 09/05/2016   Acute GI bleeding 09/04/2016    Past Surgical History:  Procedure Laterality Date   BELOW KNEE LEG AMPUTATION Left    CORONARY/GRAFT ACUTE MI REVASCULARIZATION N/A 06/20/2019   Procedure: Coronary/Graft Acute MI Revascularization;  Surgeon: Troy Sine, MD;  Location: [facility];  Service: Cardiovascular;  Laterality: N/A;   FLEXIBLE SIGMOIDOSCOPY  N/A 09/07/2016   Procedure: FLEXIBLE SIGMOIDOSCOPY;  Surgeon: Manus Gunning, MD;  Location: [facility];  Service: Gastroenterology;  Laterality: N/A;   LEFT HEART CATH AND CORONARY ANGIOGRAPHY N/A 06/20/2019   Procedure: LEFT HEART CATH AND CORONARY ANGIOGRAPHY;  Surgeon: Troy Sine, MD;  Location: [facility];  Service: Cardiovascular;  Laterality: N/A;       Family History  Problem Relation Age of Onset   Other Mother        Pt unsure of PMH of family members.    Social History   Tobacco Use   Smoking status: Former   Smokeless tobacco: Never   Tobacco comments:    Pt thinks he used to  smoke cigarettes.  Thinks he quit many years ago.  Vaping Use   Vaping Use: Never used  Substance Use Topics   Alcohol use: No   Drug use: No    Home Medications Prior to Admission medications   Medication Sig Start Date End Date Taking? Authorizing Provider  acetaminophen (TYLENOL) 325 MG tablet Take 650 mg by mouth in the morning, at noon, and at bedtime.   Yes [provider]  aspirin 81 MG tablet Chew 81 mg by mouth daily.   Yes [provider]  atorvastatin (LIPITOR) 80 MG tablet Take 1 tablet (80 mg total) by mouth daily at 6 PM. Patient taking differently: Take 80 mg by mouth at bedtime. 06/22/19  Yes Kroeger, Daleen Snook M., PA-C  B Complex-C-Folic Acid (NEPHRO VITAMINS) 0.8 MG TABS Take 1 tablet by mouth daily.   Yes [provider]  clopidogrel (PLAVIX) 75 MG tablet Take 75 mg by mouth daily.   Yes [provider]  docusate sodium (COLACE) 100 MG capsule Take 100 mg by mouth daily.   Yes [provider]  ethyl chloride spray Apply 1 application topically every Monday, Wednesday, and Friday. Given at diaylasis   Yes [provider]  famotidine (PEPCID) 20 MG tablet Take 20 mg by mouth daily.   Yes [provider]  ferrous gluconate (FERGON) 324 MG tablet Take 324 mg by mouth daily with breakfast.   Yes [provider]  gabapentin (NEURONTIN) 100 MG capsule Take 100-200 mg by mouth 2 (two) times daily. Take 100mg  in the morning and 200mg  in the evening.   Yes [provider]  levETIRAcetam (KEPPRA) 500 MG tablet Take 1 tablet (500 mg total) by mouth 2 (two) times daily. 03/21/18  Yes Danford, Suann Larry, MD  levofloxacin (LEVAQUIN) 500 MG tablet Take 500 mg by mouth once as needed (before biopsy).   Yes [provider]  lidocaine (LMX) 4 % cream Apply 1 application topically daily.   Yes [provider]  metoprolol succinate (TOPROL-XL) 25 MG 24 hr tablet Take 1 tablet (25 mg total) by mouth  daily. Patient taking differently: Take 12.5 mg by mouth daily as needed (HR > 100BPM). 07/18/19 10/09/20 Yes Brimage, Vondra, DO  midodrine (PROAMATINE) 5 MG tablet Take 5 mg by mouth 3 (three) times daily with meals. Hold for sbp greater than 161 or diastolic greater than 90   Yes [provider]  nitroGLYCERIN (NITROSTAT) 0.4 MG SL tablet Place 0.4 mg under the tongue every 5 (five) minutes x 3 doses as needed for chest pain.   Yes [provider]  Nutritional Supplements (FEEDING SUPPLEMENT, NEPRO CARB STEADY,) LIQD Take 237 mLs by mouth 2 (two) times daily between meals.    Yes [provider]  ticagrelor (  BRILINTA) 90 MG TABS tablet Take 1 tablet (90 mg total) by mouth 2 (two) times daily. Patient not taking: Reported on 10/09/2020 06/22/19   Abigail Butts., PA-C    Allergies    Patient has no known allergies.  Review of Systems   Review of Systems  Unable to perform ROS: Dementia   Physical Exam Updated Vital Signs BP 99/74   Pulse 84   Temp 97.9 F (36.6 C) (Oral)   Resp (!) 21   SpO2 96%   Physical Exam Constitutional:      Appearance: He is well-developed.  HENT:     Head: Normocephalic and atraumatic.  Eyes:     Pupils: Pupils are equal, round, and reactive to light.  Cardiovascular:     Rate and Rhythm: Normal rate and regular rhythm.     Heart sounds: Normal heart sounds.     Comments: He has an indwelling catheter to his right upper chest wall Pulmonary:     Effort: Pulmonary effort is normal. No respiratory distress.     Breath sounds: Normal breath sounds. No wheezing or rales.  Chest:     Chest wall: No tenderness.  Abdominal:     General: Bowel sounds are normal.     Palpations: Abdomen is soft.     Tenderness: There is abdominal tenderness. There is no guarding or rebound.     Comments: Positive moderate distention, tympany on percussion, there is a small area of redness to the left periumbilical area  Musculoskeletal:         General: Normal range of motion.     Cervical back: Normal range of motion and neck supple.     Comments: Status post left BKA, no edema  Lymphadenopathy:     Cervical: No cervical adenopathy.  Skin:    General: Skin is warm and dry.     Findings: No rash.  Neurological:     Mental Status: He is alert.     Comments: Oriented to person and place only    ED Results / Procedures / Treatments   Labs (all labs ordered are listed, but only abnormal results are displayed) Labs Reviewed  COMPREHENSIVE METABOLIC PANEL - Abnormal; Notable for the following components:      Result Value   Chloride 95 (*)    BUN 33 (*)    Creatinine, Ser 4.94 (*)    Albumin 2.9 (*)    Alkaline Phosphatase 164 (*)    GFR, Estimated 12 (*)    All other components within normal limits  CBC WITH DIFFERENTIAL/PLATELET - Abnormal; Notable for the following components:   RBC 3.44 (*)    Hemoglobin 10.6 (*)    HCT 33.0 (*)    All other components within normal limits  I-STAT CHEM 8, ED - Abnormal; Notable for the following components:   Chloride 95 (*)    BUN 34 (*)    Creatinine, Ser 4.70 (*)    Calcium, Ion 1.09 (*)    Hemoglobin 12.9 (*)    HCT 38.0 (*)    All other components within normal limits  SARS CORONAVIRUS 2 (TAT 6-24 HRS)  LIPASE, BLOOD  URINALYSIS, ROUTINE W REFLEX MICROSCOPIC    EKG None  Radiology No results found.  Procedures Procedures   Medications Ordered in ED Medications  pantoprazole (PROTONIX) injection 40 mg (has no administration in time range)  cefTRIAXone (ROCEPHIN) 1 g in sodium chloride 0.9 % 100 mL IVPB (has no administration in time range)  azithromycin (ZITHROMAX) 500 mg in sodium chloride 0.9 % 250 mL IVPB (has no administration in time range)    ED Course  I have reviewed the triage vital signs and the nursing notes.  Pertinent labs & imaging results that were available during my care of the patient were reviewed by me and considered in my medical decision  making (see chart for details).    MDM Rules/Calculators/A&P                           Patient is a 70 year old male who presents from a nursing home with vomiting black emesis.  He has not had any episodes of emesis in the ED.  His abdomen is distended.  CT scan shows large volume ascites with metastatic disease.  On chart review, it appears he has metastatic prostate cancer which is currently getting palliative treatment.  His hemoglobin is stable.  His blood pressures been a little soft in the low 100s and upper 90s but has not dropped lower than that.  He is afebrile.  His white count is normal.  His creatinine is elevated but his potassium is normal.  He does have some mild hypoxia and is currently on nasal cannula.  He is doing well with no increased work of breathing on nasal cannula.  Given his cough and pleural effusion, could not exclude underlying infectious process.  He was started on Rocephin and Zithromax.  I spoke with Dr. Rogers Blocker with the hospitalist service he will admit the patient for further treatment. Final Clinical Impression(s) / ED Diagnoses Final diagnoses:  Hematemesis, presence of nausea not specified  Other ascites  Pleural effusion    Rx / DC Orders ED Discharge Orders     None        Malvin Johns, MD 10/09/20 1348

## 2020-10-09 NOTE — ED Notes (Signed)
XR at bedside

## 2020-10-09 NOTE — ED Notes (Signed)
Two unsuccessful IV starts. IV team paged

## 2020-10-09 NOTE — H&P (Signed)
History and Physical    George Burgess YQM:[id number] DOB: [birth year] DOA: 10/09/2020  PCP: Caprice Renshaw, MD Consultants:  cardiology: Dr. Claiborne Billings, oncology: Dr. Alen Blew, urology: Dr. Venia Minks at [facility], nephrology: Dr. Joelyn Oms, pulm: Dr. Camillo Flaming.  Patient coming from: SNF: [facility]   Chief Complaint: vomiting, spitting up blood   HPI: George Burgess is a 70 y.o. male with medical history significant of atrial fibrillation,  HTN, HLD, ESRD on dialysis on M/W/F, G8BV, systolic and diastolic CHF, dementia, epilepsy, history of CVA who presented to ED from SNF with complaints of vomiting and Shortness of breath. Apparently had coffee ground emesis at SNF. Can not obtain any history from patient due to dementia.  Also did not get much history from his brother. He is not sure why he is here.   Remote history of drinking. Stopped drinking since he was placed in SNF after his BKA, 10 years ago.    ED Course: vitals: afebrile, bp: 96/62, HR: 71, RR: 15, oxygen: 100% on 2L [place].  Pertinent labs: BUN: 33, creatinine: 4.94, hgb: 10.6 CT abdo/pelvis: unchanged cirrhosis with increased large volume ascites, progresive osseous metastatic disease, unchanged large left and small right pleural effusions. Similar diffuse anasarca. CXR: slightly worsening aeration with persistent CHF. Increased left pleural effusion. Atelectasis or developing infection/aspiration. Given protonix, rocephin and zithromax in ER and we were asked to admit.   Review of Systems:  denies any headache, vision changes, chest pain, stomach pain. Unsure how accurate this truly   Ambulatory Status:  wheelchair bound     Past Medical History:  Diagnosis Date   Atrial fibrillation ([place])    a. Chronic Eliquis (CHA2DS2VASc = 6).   Chronic combined systolic (congestive) and diastolic (congestive) heart failure ([place])    a. Previously reported EF of 30%;  b. 08/2016 Echo: EF 40-45%, antsept, inf, infsept HK, Gr3 DD, mod AI/MR, sev dil LA, mild TR,  PASP 22mmHg.   Dementia ([place])    Encephalopathy    End stage renal disease ([place])    a. On HD.   Epilepsy, unspecified, not intractable, without status epilepticus ([place]) 03/21/2018   provided from [place] records   Essential hypertension    GIB (gastrointestinal bleeding)    a. 08/2016 in setting of Eliquis Rx.   Hyperlipidemia    Pleural effusion    PVD (peripheral vascular disease) ([place])    a. s/p L BKA;  b. Chronic LE wounds.   Stroke ([place])    Type II diabetes mellitus ([place])     Past Surgical History:  Procedure Laterality Date   BELOW KNEE LEG AMPUTATION Left    CORONARY/GRAFT ACUTE MI REVASCULARIZATION N/A 06/20/2019   Procedure: Coronary/Graft Acute MI Revascularization;  Surgeon: Troy Sine, MD;  Location: [facility];  Service: Cardiovascular;  Laterality: N/A;   FLEXIBLE SIGMOIDOSCOPY N/A 09/07/2016   Procedure: FLEXIBLE SIGMOIDOSCOPY;  Surgeon: Manus Gunning, MD;  [facility];  Service: Gastroenterology;  Laterality: N/A;   LEFT HEART CATH AND CORONARY ANGIOGRAPHY N/A 06/20/2019   Procedure: LEFT HEART CATH AND CORONARY ANGIOGRAPHY;  Surgeon: Troy Sine, MD;  Location: [facility];  Service: Cardiovascular;  Laterality: N/A;    Social History   Socioeconomic History   Marital status: Single    Spouse name: Not on file   Number of children: Not on file   Years of education: Not on file   Highest education level: Not on file  Occupational History   Occupation: retired/disabled  Tobacco Use   Smoking status: Former   Smokeless tobacco: Never   Tobacco comments:    Pt thinks he used to smoke cigarettes.  Thinks he quit many years ago.  Vaping Use   Vaping Use: Never used  Substance and Sexual Activity   Alcohol use: No   Drug use: No   Sexual activity: Never  Other Topics Concern   Not on file  Social History Narrative   Lives @ [place].  Sedentary.   Social Determinants of Health   Financial  Resource Strain: Not on file  Food Insecurity: Not on file  Transportation Needs: Not on file  Physical Activity: Not on file  Stress: Not on file  Social Connections: Not on file  Intimate Partner Violence: Not on file    No Known Allergies  Family History  Problem Relation Age of Onset   Other Mother        Pt unsure of PMH of family members.    Prior to Admission medications   Medication Sig Start Date End Date Taking? Authorizing Provider  acetaminophen (TYLENOL) 325 MG tablet Take 650 mg by mouth in the morning, at noon, and at bedtime.   Yes [provider]  aspirin 81 MG tablet Chew 81 mg by mouth daily.   Yes [provider]  atorvastatin (LIPITOR) 80 MG tablet Take 1 tablet (80 mg total) by mouth daily at 6 PM. Patient taking differently: Take 80 mg by mouth at bedtime. 06/22/19  Yes Kroeger, Daleen Snook M., PA-C  B Complex-C-Folic Acid (NEPHRO VITAMINS) 0.8 MG TABS Take 1 tablet by mouth daily.   Yes [provider]  clopidogrel (PLAVIX) 75 MG tablet Take 75 mg by mouth daily.   Yes [provider]  docusate sodium (COLACE) 100 MG capsule Take 100 mg by mouth daily.   Yes [provider]  ethyl chloride spray Apply 1 application topically every Monday, Wednesday, and Friday. Given at diaylasis   Yes [provider]  famotidine (PEPCID) 20 MG tablet Take 20 mg by mouth daily.   Yes [provider]  ferrous gluconate (FERGON) 324 MG tablet Take 324 mg by mouth daily with breakfast.   Yes [provider]  gabapentin (NEURONTIN) 100 MG capsule Take 100-200 mg by mouth 2 (two) times daily. Take 100mg  in the morning and 200mg  in the evening.   Yes [provider]  levETIRAcetam (KEPPRA) 500 MG tablet Take 1 tablet (500 mg total) by mouth 2 (two) times daily. 03/21/18  Yes Danford, Suann Larry, MD  levofloxacin (LEVAQUIN) 500 MG tablet Take 500 mg by mouth once as needed (before biopsy).   Yes [provider]  lidocaine (LMX) 4 % cream Apply 1 application topically daily.   Yes [provider]  metoprolol succinate (TOPROL-XL) 25 MG 24 hr tablet Take 1 tablet (25 mg total) by mouth daily. Patient taking differently: Take 12.5 mg by mouth daily as needed (HR > 100BPM). 07/18/19 10/09/20 Yes Brimage, Vondra, DO  midodrine (PROAMATINE) 5 MG tablet Take 5 mg by mouth 3 (three) times daily with meals. Hold for sbp greater than 536 or diastolic greater than 90   Yes [provider]  nitroGLYCERIN (NITROSTAT) 0.4 MG SL tablet Place 0.4 mg under the tongue every 5 (five) minutes x 3 doses as needed for chest pain.   Yes [provider]  Nutritional Supplements (FEEDING SUPPLEMENT, NEPRO CARB STEADY,) LIQD Take 237 mLs by mouth 2 (two) times daily between meals.  Yes [provider]  ticagrelor (BRILINTA) 90 MG TABS tablet Take 1 tablet (90 mg total) by mouth 2 (two) times daily. Patient not taking: Reported on 10/09/2020 06/22/19   Abigail Butts., PA-C    Physical Exam: Vitals:   10/09/20 1215 10/09/20 1230 10/09/20 1245 10/09/20 1300  BP: 95/66 113/71 103/77 99/74  Pulse:    84  Resp: 16 (!) 22 20 (!) 21  Temp:      TempSrc:      SpO2:    96%     General:  Appears calm and comfortable and is in NAD Eyes:  PERRL, EOMI, normal lids, iris ENT:  grossly normal hearing, lips & tongue, mmm; missing multiple teeth with broken teeth.  Neck:  no LAD, masses or thyromegaly; no carotid bruits Cardiovascular:  irregularly, irregular, no m/r/g. No LE edema.  Respiratory:   faint crackles in bilateral lobes.  Normal respiratory effort. Abdomen:  distended and firm abdomen. Umbilical hernia. Tympanic to percussion anteriorly with dullness to percussion over lower, lateral aspects/flank of abdomen. Would not cooperate for shifting dullness.  Back:   normal alignment, no CVAT Skin:  no rash or induration seen on limited exam. Dry skin on right LE>   Musculoskeletal:  grossly normal tone BUE/RLE. BKA on left., good ROM, no bony abnormality Lower extremity:  No LE edema.  Limited foot exam with no ulcerations.  2+ distal pulses. Psychiatric:  grossly normal mood and affect, appears at his baseline, speech fluent and appropriate, AOx3 Neurologic:  CN 2-12 grossly intact, moves all extremities in coordinated fashion, sensation intact    Radiological Exams on Admission: Independently reviewed - see discussion in A/P where applicable  No results found.  EKG: Independently reviewed.  Question if leads placed correctly repeat pending.    Labs on Admission: I have personally reviewed the available labs and imaging studies at the time of the admission.  Pertinent labs:  No elevated wbc BUN: 33 creatinine: 4.94 hgb: 10.6    Assessment/Plan Principal Problem:   Acute respiratory failure with hypoxia ([place]) -70 year old Old male requiring supplemental oxygen for acute respiratory failure with hypoxia likely secondary to enlarging ascites vs. Pleural effusion vs. CHF.  -He does have large left pleural effusion however this has been stable -We will continue to wean oxygen as tolerated and treat underlying issues that could be contributing to his respiratory failure, see below.   Active Problems: Cirrhosis of liver with ascites (HCC) --Worsening ascites in setting of cirrhosis and metastatic prostate cancer as well as end-stage renal disease on dialysis and severe severely reduced systolic and diastolic heart failure -We will consult IR for paracentesis. Order has been placed at this time with studies.  Due to multifactorial issues paracentesis will be crucial to figure out why he has ascites -No signs or symptoms of SBP at this time.    Pleural effusion, left -Appears to be chronic in nature.  Could be contributing to his worsening shortness of breath and acute respiratory failure however since chronic in nature tend to think more due  to increasing ascites.  -We will monitor clinically to see if needs to undergo thoracentesis -given rocephin and Zithromax in ER for possible developing infection/aspiration however patient has no white count. Unsure if coughing.  We will check a baseline procalcitonin.  We will continue to cover for CAP and follow him clinically  Hematemesis -no witnessed episodes since he has been in ER Known history of cirrhosis and worsening ascites.  Portal hypertension and esophageal varices on differential. Needs paracentesis.  -Holding aspirin Plavix and DVT prophylaxis -continue protonix, hold home pepcid.  -follow cbc  -Continue to monitor   Atrial fibrillation, chronic -Rate controlled.  Continue metoprolol -Placed on telemetry -Hold Eliquis due to possible paracentesis/hematemesis      Chronic combined systolic (congestive) and diastolic (congestive) heart failure ([place]) -Echo in May 2022 at [facility].  Severely reduced EF at 20 to 25%.  Severe left ventricular global hypokinesis.  Only basal and mid segments of anterior lateral has retained contractility.  Right ventricular systolic function is moderately reduced.  Moderate pulmonary hypertension.  The IVC is severely dilated.  Large left pleural effusion.  Trivial pericardial effusion.  No significant change in comparison with last study. -Strict I's O's and daily weights -Volume control via dialysis     ESRD on hemodialysis [facility]) -Missed dialysis session today due to being in hospital -Electrolytes stable.  Will consult nephrology for likely dialysis tomorrow -Strict I's and O's    Essential hypertension -hypotensive  -Continue midodrine TID, may need to increase dose -Metoprolol for rate control on PRN basis however may need to decrease dose or hold if blood pressure becomes unstable. Modified with holding parameters and to call MD -Some pleural effusion could also be contributing to his hypotension.     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 -Continue medical management. Holding asa/plavix. Beta blocker prn with parameters to hold for hypotension.     Type 2 diabetes mellitus with chronic kidney disease on chronic dialysis ([place]) -Appears to be diet controlled A1c has not been checked since 2021. Repeat pending -Carb modified/renal diet -Accu-Cheks per protocol and sensitive sliding scale insulin    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HCC) -Stable at baseline continue daily iron supplement    Dementia ([place]) -Appears to be at baseline after talking with his brother    History of stroke -holding ASA/plavix. 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  Hx of chronic UTI Treated for enterobacter cloacae with doxy on 07/09/20  There is no height or weight on file to calculate BMI.    Level of care: Telemetry Medical DVT prophylaxis: SCDs Code Status:  Full - confirmed with patient paperwork and brother  Family Communication: None present; I spoke with the patient's brother by telephone at the time of admission. giovonni poirier: [phone] Disposition Plan:  The patient is from: SNF  Anticipated d/c is to: SNF Requires inpatient hospitalization and is at significant risk worsening, requires constant monitoring, assessment and MDM with specialists.  Patient is currently: acutely ill and prognosis is guarded.  Consults called: nephrology Admission status:  inpatient    Orma Flaming MD [facility]   How to contact the [facility] Attending or Consulting provider [place] or covering provider during after hours [place], for this patient?  Check the care team in [facility] and look for a) attending/consulting TRH  provider listed and b) the [facility] team listed Log into [email or web address] and use [place]'s universal password to access. If you do not have the password, please contact the hospital operator. Locate the [facility] provider you are looking for under [facility] and page to a  number that you can be directly reached. If you still have difficulty reaching the provider, please page the [facility] (Director on Call) for the Hospitalists listed on amion for assistance.   10/09/2020, 1:53 PM

## 2020-10-09 NOTE — ED Triage Notes (Signed)
Pt BIB [facility] from [facility], per staff pt had several episodes of dark tarry emesis, approx 20 oz altogether. Pt with productive cough, possible aspiration. Pt gaging, dry heaving and coughing with EMS.  Dialysis w/Left arm fistula. Mon-Wed-Fri, dialysis due today at 10am.  ABD distended and tender, eyes jaundice   NSR 95 with EMS upon arrival to ED pt went into a junctional rhythm and pt endorsed feeling "blurred in the head"   4mg  Zofran IM   BP 107/68 97% RA, dropped to high 80's RA, applied 2L [place] sats increased to 100% CBG 102

## 2020-10-09 NOTE — ED Notes (Signed)
Patient transported to CT 

## 2020-10-09 NOTE — ED Notes (Signed)
Pt continues to cough, unable to get anything up, pt given suction in case he is able to get any more sputum up. This RN explained how to use suction.

## 2020-10-09 NOTE — Consult Note (Signed)
Renal Service Consult Note [facility]  Castiel Lauricella 10/09/2020 Sol Blazing, MD Requesting Physician: Dr. Rogers Blocker, A.   Reason for Consult: ESRD pt w/ nausea and vomiting HPI: The patient is a 70 y.o. year-old w/ hx of dementia, atrial fib, ESRD on HD, combined syst/ diast CHF, HTN, GIB , CVA sent to ED from SNF for N/V and coffee ground emesis. BP's were 80's - 90's. Is reportedly on palliative Rx for prostate cancer. +sig coughing in ED. CXR showed layering L effusion w/ basilar consolidation. CT showed diffuse ascites large volume, large L pleural effusion, and bony mets.  Asked to see for ESRD.    Pt is poor historian d/t dementia.  Main c/o nausea and vomiting. +heavy cough.    ROS - denies CP, no joint pain, no HA, no blurry vision, no rash, no diarrhea   Past Medical History  Past Medical History:  Diagnosis Date   Atrial fibrillation ([place])    a. Chronic Eliquis (CHA2DS2VASc = 6).   Chronic combined systolic (congestive) and diastolic (congestive) heart failure ([place])    a. Previously reported EF of 30%;  b. 08/2016 Echo: EF 40-45%, antsept, inf, infsept HK, Gr3 DD, mod AI/MR, sev dil LA, mild TR, PASP 64mmHg.   Dementia ([place])    Encephalopathy    End stage renal disease ([place])    a. On HD.   Epilepsy, unspecified, not intractable, without status epilepticus ([place]) 03/21/2018   provided from [place] records   Essential hypertension    GIB (gastrointestinal bleeding)    a. 08/2016 in setting of Eliquis Rx.   Hyperlipidemia    Pleural effusion    PVD (peripheral vascular disease) ([place])    a. s/p L BKA;  b. Chronic LE wounds.   Stroke ([place])    Type II diabetes mellitus ([place])    Past Surgical History  Past Surgical History:  Procedure Laterality Date   BELOW KNEE LEG AMPUTATION Left    CORONARY/GRAFT ACUTE MI REVASCULARIZATION N/A 06/20/2019   Procedure: Coronary/Graft Acute MI Revascularization;  Surgeon: Troy Sine, MD;  Location: [facility];  Service: Cardiovascular;  Laterality: N/A;   FLEXIBLE SIGMOIDOSCOPY N/A 09/07/2016   Procedure: FLEXIBLE SIGMOIDOSCOPY;  Surgeon: Manus Gunning, MD;  [facility];  Service: Gastroenterology;  Laterality: N/A;   LEFT HEART CATH AND CORONARY ANGIOGRAPHY N/A 06/20/2019   Procedure: LEFT HEART CATH AND CORONARY ANGIOGRAPHY;  Surgeon: Troy Sine, MD;  Location: [facility] [place] [facility];  Service: Cardiovascular;  Laterality: N/A;   Family History  Family History  Problem Relation Age of Onset   Other Mother        Pt unsure of PMH of family members.   Social History  reports that he has quit smoking. He has never used smokeless tobacco. He reports that he does not drink alcohol and does not use drugs. Allergies No Known Allergies Home medications Prior to Admission medications   Medication Sig Start Date End Date Taking? Authorizing Provider  acetaminophen (TYLENOL) 325 MG tablet Take 650 mg by mouth in the morning, at noon, and at bedtime.   Yes [provider]  aspirin 81 MG tablet Chew 81 mg by mouth daily.   Yes [provider]  atorvastatin (LIPITOR) 80 MG tablet Take 1 tablet (80 mg total) by mouth daily at 6 PM. Patient taking differently: Take 80 mg by mouth at bedtime. 06/22/19  Yes Kroeger, Lorelee Cover., PA-C  B Complex-C-Folic Acid (  NEPHRO VITAMINS) 0.8 MG TABS Take 1 tablet by mouth daily.   Yes [provider]  clopidogrel (PLAVIX) 75 MG tablet Take 75 mg by mouth daily.   Yes [provider]  docusate sodium (COLACE) 100 MG capsule Take 100 mg by mouth daily.   Yes [provider]  ethyl chloride spray Apply 1 application topically every Monday, Wednesday, and Friday. Given at diaylasis   Yes [provider]  famotidine (PEPCID) 20 MG tablet Take 20 mg by mouth daily.   Yes [provider]  ferrous gluconate (FERGON) 324 MG tablet Take 324 mg by mouth daily with breakfast.   Yes [provider]  gabapentin (NEURONTIN) 100 MG capsule Take 100-200 mg by mouth 2 (two) times daily. Take 100mg  in the morning and 200mg  in the evening.   Yes [provider]  levETIRAcetam (KEPPRA) 500 MG tablet Take 1 tablet (500 mg total) by mouth 2 (two) times daily. 03/21/18  Yes Danford, Suann Larry, MD  levofloxacin (LEVAQUIN) 500 MG tablet Take 500 mg by mouth once as needed (before biopsy).   Yes [provider]  lidocaine (LMX) 4 % cream Apply 1 application topically daily.   Yes [provider]  metoprolol succinate (TOPROL-XL) 25 MG 24 hr tablet Take 1 tablet (25 mg total) by mouth daily. Patient taking differently: Take 12.5 mg by mouth daily as needed (HR > 100BPM). 07/18/19 10/09/20 Yes Brimage, Vondra, DO  midodrine (PROAMATINE) 5 MG tablet Take 5 mg by mouth 3 (three) times daily with meals. Hold for sbp greater than 509 or diastolic greater than 90   Yes [provider]  nitroGLYCERIN (NITROSTAT) 0.4 MG SL tablet Place 0.4 mg under the tongue every 5 (five) minutes x 3 doses as needed for chest pain.   Yes [provider]  Nutritional Supplements (FEEDING SUPPLEMENT, NEPRO CARB STEADY,) LIQD Take 237 mLs by mouth 2 (two) times daily between meals.    Yes [provider]  ticagrelor (BRILINTA) 90 MG TABS tablet Take 1 tablet (90 mg total) by mouth 2 (two) times daily. Patient not taking: Reported on 10/09/2020 06/22/19   Roby Lofts M., PA-C     Vitals:   10/09/20 1330 10/09/20 1345 10/09/20 1400 10/09/20 1637  BP: 96/69 106/73 101/67 95/73  Pulse:    85  Resp: (!) 9 (!) 26 12 18   Temp:    97.7 F (36.5 C)  TempSrc:    Oral  SpO2:    94%   Exam Gen awake, disheveled, coughing , spitting up No rash, cyanosis or gangrene Sclera anicteric, throat clear  No jvd or bruits Chest clear on R, dec'd  L base RRR no MRG Abd soft w/ 2-3+ ascites / distended abdomen GU normal male MS no joint effusions or deformity Ext no LE  or other edema, L BKA Neuro nonfocal, gen'd weakness, deconditioning  LUA AVF +bruit,  R IJ TDC      Home meds include asa, lipitor, plavix, pepcid, neurontin 100-200 bid, keppra 500 bid, toprol xl 25 qd, midodrine 5 mg tid, sl ntg prn, brilinta, nepro bid, prn's , vitamins   CT abd - IMPRESSION: 1. Unchanged cirrhosis with increased large volume ascites. 2. Progressive osseous metastatic disease. 3. Unchanged large left and small right pleural effusions. Similar diffuse anasarca.   CXR - increased left pleural effusion and adjacent airspace disease. This could represent atelectasis or developing infection/aspiration   OP HD: [place] [facility] - records pending  LUA AVF and RIJ TDC  Assessment/ Plan: Acute resp failure/ hypoxia - large L effusion and ascites main issue most likely. With low BP's will not be able to effect fluid removal w/ HD, would recommend fluid removal w/ para and thoracentesis is aggressive Rx is planned.  Cirrhosis - w/ very large volume ascites ESRD - on HD in [place]. Will get records tomorrow. No urgent indication for HD, cannot pull fluid due to hypotension. Poor candidate for CRRT given advanced cancer, cirrhosis, CVA , FTT. Will plan dialysis tomorrow.  Hypotension - due to heart failure and/ or cirrhosis physiology. Midodrine at home 5 tid, can ^ up to 10-15 tid as needed.  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

## 2020-10-09 NOTE — Progress Notes (Signed)
Pt has lower BP and temp. MD Orma Flaming was notified.

## 2020-10-10 ENCOUNTER — Inpatient Hospital Stay (HOSPITAL_COMMUNITY): Payer: Medicare Other

## 2020-10-10 ENCOUNTER — Encounter: Payer: Self-pay | Admitting: Radiology

## 2020-10-10 HISTORY — PX: IR PARACENTESIS: IMG2679

## 2020-10-10 LAB — COMPREHENSIVE METABOLIC PANEL
ALT: 24 U/L (ref 0–44)
AST: 44 U/L — ABNORMAL HIGH (ref 15–41)
Albumin: 2.6 g/dL — ABNORMAL LOW (ref 3.5–5.0)
Alkaline Phosphatase: 153 U/L — ABNORMAL HIGH (ref 38–126)
Anion gap: 13 (ref 5–15)
BUN: 42 mg/dL — ABNORMAL HIGH (ref 8–23)
CO2: 25 mmol/L (ref 22–32)
Calcium: 8.5 mg/dL — ABNORMAL LOW (ref 8.9–10.3)
Chloride: 98 mmol/L (ref 98–111)
Creatinine, Ser: 5.25 mg/dL — ABNORMAL HIGH (ref 0.61–1.24)
GFR, Estimated: 11 mL/min — ABNORMAL LOW (ref >60)
Glucose, Bld: 90 mg/dL (ref 70–99)
Potassium: 5.3 mmol/L — ABNORMAL HIGH (ref 3.5–5.1)
Sodium: 136 mmol/L (ref 135–145)
Total Bilirubin: 1 mg/dL (ref 0.3–1.2)
Total Protein: 6 g/dL — ABNORMAL LOW (ref 6.5–8.1)

## 2020-10-10 LAB — GRAM STAIN

## 2020-10-10 LAB — AMYLASE: Amylase: 111 U/L — ABNORMAL HIGH (ref 28–100)

## 2020-10-10 LAB — CBC
HCT: 31.2 % — ABNORMAL LOW (ref 39.0–52.0)
Hemoglobin: 10.1 g/dL — ABNORMAL LOW (ref 13.0–17.0)
MCH: 31.7 pg (ref 26.0–34.0)
MCHC: 32.4 g/dL (ref 30.0–36.0)
MCV: 97.8 fL (ref 80.0–100.0)
Platelets: 149 10*3/uL — ABNORMAL LOW (ref 150–400)
RBC: 3.19 MIL/uL — ABNORMAL LOW (ref 4.22–5.81)
RDW: 15.5 % (ref 11.5–15.5)
WBC: 7 10*3/uL (ref 4.0–10.5)
nRBC: 0 % (ref 0.0–0.2)

## 2020-10-10 LAB — GLUCOSE, CAPILLARY
Glucose-Capillary: 89 mg/dL (ref 70–99)
Glucose-Capillary: 76 mg/dL (ref 70–99)
Glucose-Capillary: 102 mg/dL — ABNORMAL HIGH (ref 70–99)
Glucose-Capillary: 93 mg/dL (ref 70–99)

## 2020-10-10 LAB — BODY FLUID CELL COUNT WITH DIFFERENTIAL
Eos, Fluid: 0 %
Lymphs, Fluid: 54 %
Monocyte-Macrophage-Serous Fluid: 40 % — ABNORMAL LOW (ref 50–90)
Neutrophil Count, Fluid: 6 % (ref 0–25)
Total Nucleated Cell Count, Fluid: 54 cu mm (ref 0–1000)

## 2020-10-10 LAB — PROCALCITONIN: Procalcitonin: 1.35 ng/mL

## 2020-10-10 LAB — LACTATE DEHYDROGENASE, PLEURAL OR PERITONEAL FLUID: LD, Fluid: 65 U/L — ABNORMAL HIGH (ref 3–23)

## 2020-10-10 LAB — PROTEIN, PLEURAL OR PERITONEAL FLUID: Total protein, fluid: 3.6 g/dL

## 2020-10-10 LAB — ALBUMIN, PLEURAL OR PERITONEAL FLUID: Albumin, Fluid: 1.9 g/dL

## 2020-10-10 LAB — GLUCOSE, PLEURAL OR PERITONEAL FLUID: Glucose, Fluid: 91 mg/dL

## 2020-10-10 MED ORDER — LIDOCAINE HCL 1 % IJ SOLN
INTRAMUSCULAR | Status: AC
  Filled 2020-10-10: qty 20

## 2020-10-10 MED ORDER — LIDOCAINE HCL 1 % IJ SOLN
INTRAMUSCULAR | Status: DC | PRN
  Administered 2020-10-10: 10 mL

## 2020-10-10 NOTE — Plan of Care (Signed)

## 2020-10-10 NOTE — Procedures (Signed)
Ultrasound-guided diagnostic and therapeutic paracentesis performed yielding 4.2 liters of straw colored fluid.  Fluid was sent to lab for analysis. No immediate complications. EBL is non.

## 2020-10-10 NOTE — Progress Notes (Signed)
[facility] Progress Note  Subjective: seen in room, no new c/o  Vitals:   10/10/20 0500 10/10/20 0741 10/10/20 1056 10/10/20 1300  BP: 95/73 95/62 (!) 87/53 (!) 89/56  Pulse: 83     Resp: 10 14 15    Temp: 97.6 F (36.4 C) (!) 96.2 F (35.7 C) (!) 97.5 F (36.4 C)   TempSrc: Axillary Axillary Oral   SpO2: 95% 95%      Exam: Gen awake, disheveled, nasal O2 No rash, cyanosis or gangrene Sclera anicteric, throat clear  No jvd or bruits Chest clear on R, dec'd  L base RRR no MRG Abd soft w/ 2-3+ ascites / distended abdomen GU normal male MS no joint effusions or deformity Ext no LE or other edema, L BKA Neuro nonfocal, gen'd weakness, deconditioning  LUA AVF +bruit,  R IJ TDC        Home meds include asa, lipitor, plavix, pepcid, neurontin 100-200 bid, keppra 500 bid, toprol xl 25 qd, midodrine 5 mg tid, sl ntg prn, brilinta, nepro bid, prn's , vitamins    CT abd - IMPRESSION: 1. Unchanged cirrhosis with increased large volume ascites. 2. Progressive osseous metastatic disease. 3. Unchanged large left and small right pleural effusions. Similar diffuse anasarca.   CXR - increased left pleural effusion and adjacent airspace disease. This could represent atelectasis or developing infection/aspiration    OP HD: [place] [facility] - records pending      LUA AVF and RIJ TDC   Assessment/ Plan: Acute resp failure/ hypoxia - large L effusion and ascites main issue most likely. With low BP's will not be able to effect fluid removal w/ HD, would recommend fluid removal w/ para and thoracentesis is aggressive Rx is planned.  Cirrhosis - w/ very large volume ascites ESRD - on HD in [place]. Poor candidate for CRRT given advanced cancer, cirrhosis, CVA , FTT. Plan for HD today.  Hypotension - due to heart failure and/ or cirrhosis physiology. BP's soft here in the 80's- 90's. On midodrine at home 5 tid. Doubt can pull much fluid on HD w/ low BP's.  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 ok, get phos, get meds H/o CVA Deconditioned - SNF dependent Chronic syst/ diast CHF - last EF 20-25% HTN - would not give any BP lowering meds (on metoprolol xl at home it appears) Dementia  Seizure d/o - on keppra Metastatic prostate cancer       Rob Jimmi Sidener 10/10/2020, 1:49 PM   Recent Labs  Lab 10/09/20 1118 10/09/20 1125 10/10/20 0147  K 4.6 4.5 5.3*  BUN 33* 34* 42*  CREATININE 4.94* 4.70* 5.25*  CALCIUM 8.9  --  8.5*  HGB 10.6* 12.9* 10.1*   Inpatient medications:  atorvastatin  80 mg Oral QHS   Chlorhexidine Gluconate Cloth  6 each Topical Q0600   docusate sodium  100 mg Oral Daily   feeding supplement (NEPRO CARB STEADY)  237 mL Oral BID BM   ferrous gluconate  324 mg Oral Q breakfast   gabapentin  100-200 mg Oral BID   insulin aspart  0-6 Units Subcutaneous TID WC   levETIRAcetam  500 mg Oral BID   lidocaine       midodrine  5 mg Oral TID WC   multivitamin  1 tablet Oral Daily   pantoprazole (PROTONIX) IV  40 mg Intravenous Q24H   sodium chloride flush  3 mL Intravenous Q12H    sodium  chloride     azithromycin (ZITHROMAX) 500 MG IVPB (Vial-Mate Adaptor)     cefTRIAXone (ROCEPHIN)  IV     sodium chloride, acetaminophen **OR** acetaminophen, lidocaine, metoprolol succinate, nitroGLYCERIN, sodium chloride flush

## 2020-10-10 NOTE — Progress Notes (Signed)
Right Subclavian [place] catheter assessed. Dressing is clean, dry, and intact. Catheter in place and clamped. Left fistula assessed. Thrill felt.

## 2020-10-10 NOTE — Progress Notes (Signed)
PROGRESS NOTE    George Burgess  XBM:[id number] DOB: May 14, 1950 DOA: 10/09/2020 PCP: Caprice Renshaw, MD    Brief Narrative:  70 year old gentleman with history of chronic atrial fibrillation, hypertension, hyperlipidemia, ESRD on hemodialysis Monday Wednesday Friday, type 2 diabetes, chronic combined heart failure, dementia, epilepsy brought from nursing home with vomiting and shortness of breath.  Also noted coffee-ground emesis at nursing home.  In the emergency room, hemodynamically stable.  100% on 2 L nasal cannula.  CT scan abdomen pelvis with unchanged cirrhosis, large volume ascites, progressive osseous metastatic disease, unchanged large (small right pleural effusions.  Diffuse anasarca.  Given Protonix, Rocephin and azithromycin in the emergency room and we were asked to admit.   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  Keep on oxygen to keep saturation more than 90%. Large left pleural effusion present chronically.  Will order thoracentesis Large ascites, will order paracentesis. Hopefully decreasing the fluid will help with the breathing.  Cirrhosis of liver with ascites: In the setting of cirrhosis and metastatic prostate cancer as well as ESRD.  Patient also with severely reduced systolic and diastolic  function. Paracentesis for symptom relief.  No evidence of SBP on clinical exam.  Suspected pneumonia: Less likely.  Will check thoracentesis fluid.  Hematemesis: Known history of cirrhosis and worsening ascites.  Expected portal hypertension and esophageal varices.  Hold aspirin and Plavix.  Continue Protonix.  Hemoglobin remains  stable.  Since symptoms improved, no benefits of upper GI endoscopy.  Chronic atrial fibrillation: Rate controlled.  On metoprolol.  Eliquis on hold.  Chronic combined heart failure: Ejection fraction 20%.  Volume control with dialysis.  ESRD on hemodialysis: Getting dialysis.  Nephrology on board.  Type 2 diabetes with ESRD: On sliding scale insulin today.  Dementia: Symptomatic treatment.  On Keppra for seizure.  DVT prophylaxis: SCDs Start: 10/09/20 1449   Code Status: Full code Family Communication: None present Disposition Plan: Status is: Inpatient  Remains inpatient appropriate because:Inpatient level of care appropriate due to severity of illness  Dispo: The patient is from: SNF              Anticipated d/c is to: SNF              Patient currently is not medically stable to d/c.   Difficult to place patient No         Consultants:  Nephrology  Procedures:  None  Antimicrobials:  Anti-infectives (From admission, onward)    Start     Dose/Rate Route Frequency Ordered Stop   10/10/20 1315  cefTRIAXone (ROCEPHIN) 1 g in sodium chloride 0.9 % 100 mL IVPB        1 g 200 mL/hr over 30 Minutes Intravenous Every 24 hours 10/09/20 1940     10/10/20 1315  azithromycin (ZITHROMAX) 500 mg in sodium chloride  0.9 % 250 mL IVPB        500 mg 250 mL/hr over 60 Minutes Intravenous Every 24 hours 10/09/20 1940 10/15/20 1314   10/09/20 1315  cefTRIAXone (ROCEPHIN) 1 g in sodium chloride 0.9 % 100 mL IVPB        1 g 200 mL/hr over 30 Minutes Intravenous  Once 10/09/20 1313 10/09/20 1423   10/09/20 1315  azithromycin (ZITHROMAX) 500 mg in sodium  chloride 0.9 % 250 mL IVPB        500 mg 250 mL/hr over 60 Minutes Intravenous  Once 10/09/20 1313 10/09/20 1527          Subjective: Patient seen and examined.  Pleasant to interaction but confused.  Not oriented.  No family at bedside.  He tells me he is from [facility].  He does not know why he is here.  Objective: Vitals:   10/10/20 0741 10/10/20 1056 10/10/20 1300 10/10/20 1349  BP: 95/62 (!) 87/53 (!) 89/56 (!) 88/53  Pulse:    83  Resp: 14 15  12   Temp: (!) 96.2 F (35.7 C) (!) 97.5 F (36.4 C)  (!) 97.5 F (36.4 C)  TempSrc: Axillary Oral  Oral  SpO2: 95%   98%    Intake/Output Summary (Last 24 hours) at 10/10/2020 1357 Last data filed at 10/09/2020 1800 Gross per 24 hour  Intake 348.68 ml  Output --  Net 348.68 ml   There were no vitals filed for this visit.  Examination:  General: Chronically sick looking.  Debilitated gentleman on room air.  Not in any distress while resting. Cardiovascular: S1-S2 normal.  Ejection murmur present.  No other added sounds. Respiratory: No air entry on the left lower lung fields.  No added sounds. Gastrointestinal: Distended.  Tympanic check.  Bowel sounds present. Ext:  AV fistula left upper extremity. Permacath present right IJ. BKA left leg with clean stump. Neuro: Generalized weakness.  Moves all extremities.     Data Reviewed: I have personally reviewed following labs and imaging studies  CBC: Recent Labs  Lab 10/09/20 1118 10/09/20 1125 10/10/20 0147  WBC 8.1  --  7.0  NEUTROABS 6.2  --   --   HGB 10.6* 12.9* 10.1*  HCT 33.0* 38.0* 31.2*  MCV 95.9  --  97.8  PLT 177  --  440*   Basic Metabolic Panel: Recent Labs  Lab 10/09/20 1118 10/09/20 1125 10/10/20 0147  NA 137 137 136  K 4.6 4.5 5.3*  CL 95* 95* 98  CO2 29  --  25  GLUCOSE 96 92 90  BUN 33* 34* 42*  CREATININE 4.94* 4.70* 5.25*  CALCIUM 8.9  --  8.5*   GFR: CrCl cannot be calculated (Unknown ideal weight.). Liver Function  Tests: Recent Labs  Lab 10/09/20 1118 10/10/20 0147  AST 35 44*  ALT 23 24  ALKPHOS 164* 153*  BILITOT 0.9 1.0  PROT 6.6 6.0*  ALBUMIN 2.9* 2.6*   Recent Labs  Lab 10/09/20 1118  LIPASE 48   No results for input(s): AMMONIA in the last 168 hours. Coagulation Profile: No results for input(s): INR, PROTIME in the last 168 hours. Cardiac Enzymes: No results for input(s): CKTOTAL, CKMB, CKMBINDEX, TROPONINI in the last 168 hours. BNP (last 3 results) No results for input(s): PROBNP in the last 8760 hours. HbA1C: Recent Labs    10/09/20 1118  HGBA1C 6.6*   CBG: Recent Labs  Lab 10/09/20 2113 10/10/20 0747 10/10/20 1209  GLUCAP 91 89  76   Lipid Profile: No results for input(s): CHOL, HDL, LDLCALC, TRIG, CHOLHDL, LDLDIRECT in the last 72 hours. Thyroid Function Tests: No results for input(s): TSH, T4TOTAL, FREET4, T3FREE, THYROIDAB in the last 72 hours. Anemia Panel: No results for input(s): VITAMINB12, FOLATE, FERRITIN, TIBC, IRON, RETICCTPCT in the last 72 hours. Sepsis Labs: Recent Labs  Lab 10/09/20 1739 10/10/20 0147  PROCALCITON 0.78 1.35    Recent Results (from the past 240 hour(s))  SARS CORONAVIRUS 2 (TAT 6-24 HRS) Nasopharyngeal Nasopharyngeal Swab     Status: None   Collection Time: 10/09/20  1:45 PM   Specimen: Nasopharyngeal Swab  Result Value Ref Range Status   SARS Coronavirus 2 NEGATIVE NEGATIVE Final    Comment: (NOTE) SARS-CoV-2 target nucleic acids are NOT DETECTED.  The SARS-CoV-2 RNA is generally detectable in upper and lower respiratory specimens during the acute phase of infection. Negative results do not preclude SARS-CoV-2 infection, do not rule out co-infections with other pathogens, and should not be used as the sole basis for treatment or other patient management decisions. Negative results must be combined with clinical observations, patient history, and epidemiological information. The expected result is Negative.  Fact  Sheet for Patients: SugarRoll.be  Fact Sheet for Healthcare Providers: https://www.woods-mathews.com/  This test is not yet approved or cleared by the [place] FDA and  has been authorized for detection and/or diagnosis of SARS-CoV-2 by FDA under an Emergency Use Authorization (EUA). This EUA will remain  in effect (meaning this test can be used) for the duration of the COVID-19 declaration under Se ction 564(b)(1) of the Act, 21 U.S.C. section 360bbb-3(b)(1), unless the authorization is terminated or revoked sooner.  Performed at [facility], [place] [address] [place], [place] [address]          Radiology Studies: CT Abdomen Pelvis Wo Contrast  Result Date: 10/09/2020 CLINICAL DATA:  Chronic abdominal pain. EXAM: CT ABDOMEN AND PELVIS WITHOUT CONTRAST TECHNIQUE: Multidetector CT imaging of the abdomen and pelvis was performed following the standard protocol without IV contrast. COMPARISON:  CT abdomen pelvis dated March 21, 2020. FINDINGS: Lower chest: Unchanged large left pleural effusion with partial collapse of the left lower lobe. Unchanged small right pleural effusion with pleural thickening and round atelectasis in the right lower lobe. Similar cardiomegaly. Decreased trace pleural effusion. Hepatobiliary: Unchanged small liver with irregular, nodular contour. No focal liver abnormality. Suspected tiny gallstones, similar to prior study. No biliary dilatation. Pancreas: Unremarkable. No pancreatic ductal dilatation or surrounding inflammatory changes. Spleen: Normal in size without focal abnormality. Adrenals/Urinary Tract: Adrenal glands are unremarkable. Unchanged bilateral renal atrophy with multiple punctate right renal calculi. No hydronephrosis. Mild circumferential bladder wall thickening, improved since the prior study. Stomach/Bowel: Unchanged small hiatal hernia. Stomach is otherwise within normal limits. Appendix appears  normal. No evidence of bowel wall thickening, distention, or inflammatory changes. Colonic diverticulosis. Vascular/Lymphatic: Aortic atherosclerosis. No enlarged abdominal or pelvic lymph nodes. Reproductive: Unchanged mild prostatomegaly. Other: Large volume ascites, increased since the prior study. Similar diffuse anasarca. No pneumoperitoneum. Musculoskeletal: Multiple sclerotic lesions in the spine, pelvis, and proximal left femur demonstrate overall progression since the prior study. IMPRESSION: 1. Unchanged cirrhosis with increased large volume ascites. 2. Progressive osseous metastatic disease. 3. Unchanged large left and small right pleural effusions. Similar diffuse anasarca. 4. Unchanged punctate nonobstructive right nephrolithiasis. 5. Aortic Atherosclerosis (ICD10-I70.0). Electronically Signed   By: Titus Dubin M.D.   On: 10/09/2020 11:30   DG Chest Port 1 View  Result Date:  10/09/2020 CLINICAL DATA:  Cough.  Possible aspiration. EXAM: PORTABLE CHEST 1 VIEW COMPARISON:  09/27/2019 FINDINGS: Dialysis catheter tip at high right atrium. Left subclavian vascular stent, new in the interval. Midline trachea. Moderate cardiomegaly. Moderate left pleural effusion, increased. Possible mild loculation laterally. No pneumothorax. Moderate interstitial edema, similar. Increase in left base airspace disease. IMPRESSION: Slightly worsening aeration with persistent congestive heart failure, increased left pleural effusion and adjacent airspace disease. This could represent atelectasis or developing infection/aspiration Electronically Signed   By: Abigail Miyamoto M.D.   On: 10/09/2020 11:29   IR Paracentesis  Result Date: 10/10/2020 Jacqualine Mau, NP     10/10/2020  1:39 PM Ultrasound-guided diagnostic and therapeutic paracentesis performed yielding 4.2 liters of straw colored fluid.  Fluid was sent to lab for analysis. No immediate complications. EBL is non.        Scheduled Meds:  atorvastatin   80 mg Oral QHS   Chlorhexidine Gluconate Cloth  6 each Topical Q0600   docusate sodium  100 mg Oral Daily   feeding supplement (NEPRO CARB STEADY)  237 mL Oral BID BM   ferrous gluconate  324 mg Oral Q breakfast   gabapentin  100-200 mg Oral BID   insulin aspart  0-6 Units Subcutaneous TID WC   levETIRAcetam  500 mg Oral BID   lidocaine       midodrine  5 mg Oral TID WC   multivitamin  1 tablet Oral Daily   pantoprazole (PROTONIX) IV  40 mg Intravenous Q24H   sodium chloride flush  3 mL Intravenous Q12H   Continuous Infusions:  sodium chloride     azithromycin (ZITHROMAX) 500 MG IVPB (Vial-Mate Adaptor)     cefTRIAXone (ROCEPHIN)  IV       LOS: 1 day    Time spent: 35 minutes    Barb Merino, MD [facility] Pager [phone]

## 2020-10-10 NOTE — TOC Initial Note (Signed)
Transition of Care [facility]) - Initial/Assessment Note    Patient Details  Name: George Burgess MRN: [id number] Date of Birth: Jul 01, 1950  [facility] CM/SW Contact:    Joanne Chars, LCSW Phone Number: 10/10/2020, 3:15 PM  Clinical Narrative:  CSW spoke with pt who was unable to participate in assessment.  CSW spoke with pt brother [name] who confirmed pt is long term resident at [facility] (5 years)  Informed brother that pt should return there tomorrow.  Per brother, pt is vaccinated for covid.  CSW confirmed with Star at [facility] that they can receive pt tomorrow, just need FL2.                  Expected Discharge Plan: Long Term Nursing Home Barriers to Discharge: Continued Medical Work up   Patient Goals and CMS Choice        Expected Discharge Plan and Services Expected Discharge Plan: [place]       Living arrangements for the past 2 months: [place]                                      Prior Living Arrangements/Services Living arrangements for the past 2 months: [place] Lives with:: Facility Resident Patient language and need for interpreter reviewed:: Yes        Need for Family Participation in Patient Care: Yes (Comment) Care giver support system in place?: Yes (comment) Current home services: Other (comment) (na) Criminal Activity/Legal Involvement Pertinent to Current Situation/Hospitalization: No - Comment as needed  Activities of Daily Living      Permission Sought/Granted                  Emotional Assessment Appearance:: Appears stated age Attitude/Demeanor/Rapport: Unable to Assess Affect (typically observed): Unable to Assess Orientation: : Oriented to Self Alcohol / Substance Use: Not Applicable Psych Involvement: No (comment)  Admission diagnosis:  Pleural effusion [J90] Other ascites [R18.8] Acute respiratory failure with hypoxia (HCC) [J96.01] Hematemesis,  presence of nausea not specified [K92.0] Patient Active Problem List   Diagnosis Date Noted   Type 2 diabetes mellitus with chronic kidney disease on chronic dialysis ([place]) 10/09/2020   Acute respiratory failure with hypoxia ([place]) 10/09/2020   Cirrhosis of liver with ascites ([place]) 10/09/2020   Pleural effusion, left 10/09/2020   Prostate cancer ([place]) 05/11/2020   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)    Urinary tract infection without hematuria    Tachycardia    Chest pain 07/13/2019   Epilepsy ([place]) 07/13/2019   Altered mental status    Atrial flutter ([place])    History of ST elevation myocardial infarction (STEMI) 06/20/2019   Dementia ([place])    Chronic combined systolic (congestive) and diastolic (congestive) heart failure (HCC)    PVD (peripheral vascular disease) ([place])    History of stroke    Seizure ([place]) 09/11/2017   ESRD on hemodialysis ([place]) 09/11/2017   Elevated troponin 09/11/2017   Atrial fibrillation, chronic 09/11/2017   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[place]) 09/11/2017   Essential hypertension 09/11/2017   Hypoglycemia 09/11/2017   Syncope 09/10/2017   Blindness    Bloody stools    Encounter for nasogastric (NG) tube placement    Lower GI bleed    Malnutrition of moderate degree 09/05/2016   Acute GI bleeding 09/04/2016   PCP:  Caprice Renshaw,  MD Pharmacy:  No Pharmacies Listed    Social Determinants of Health (SDOH) Interventions    Readmission Risk Interventions No flowsheet data found.

## 2020-10-11 ENCOUNTER — Inpatient Hospital Stay (HOSPITAL_COMMUNITY): Payer: Medicare Other

## 2020-10-11 HISTORY — PX: IR THORACENTESIS ASP PLEURAL SPACE W/IMG GUIDE: IMG5380

## 2020-10-11 LAB — GRAM STAIN

## 2020-10-11 LAB — TRIGLYCERIDES, BODY FLUIDS: Triglycerides, Fluid: 25 mg/dL

## 2020-10-11 LAB — CYTOLOGY - NON PAP

## 2020-10-11 LAB — GLUCOSE, CAPILLARY
Glucose-Capillary: 83 mg/dL (ref 70–99)
Glucose-Capillary: 78 mg/dL (ref 70–99)
Glucose-Capillary: 83 mg/dL (ref 70–99)
Glucose-Capillary: 153 mg/dL — ABNORMAL HIGH (ref 70–99)
Glucose-Capillary: 116 mg/dL — ABNORMAL HIGH (ref 70–99)

## 2020-10-11 LAB — PROCALCITONIN: Procalcitonin: 1.87 ng/mL

## 2020-10-11 LAB — ALBUMIN, PLEURAL OR PERITONEAL FLUID: Albumin, Fluid: 1.6 g/dL

## 2020-10-11 LAB — BODY FLUID CELL COUNT WITH DIFFERENTIAL
Eos, Fluid: 0 %
Lymphs, Fluid: 51 %
Monocyte-Macrophage-Serous Fluid: 48 % — ABNORMAL LOW (ref 50–90)
Neutrophil Count, Fluid: 1 % (ref 0–25)
Total Nucleated Cell Count, Fluid: 92 cu mm (ref 0–1000)

## 2020-10-11 LAB — SURGICAL PCR SCREEN
MRSA, PCR: NEGATIVE
Staphylococcus aureus: NEGATIVE

## 2020-10-11 LAB — HEPATITIS B SURFACE ANTIGEN: Hepatitis B Surface Ag: NONREACTIVE

## 2020-10-11 LAB — PH, BODY FLUID: pH, Body Fluid: 7.8

## 2020-10-11 MED ORDER — ALBUMIN HUMAN 25 % IV SOLN
25.0000 g | Freq: Once | INTRAVENOUS | Status: AC
  Administered 2020-10-11: 12.5 g via INTRAVENOUS
  Filled 2020-10-11: qty 100

## 2020-10-11 MED ORDER — LIDOCAINE HCL (PF) 1 % IJ SOLN
INTRAMUSCULAR | Status: DC | PRN
  Administered 2020-10-11: 10 mL

## 2020-10-11 MED ORDER — PANTOPRAZOLE SODIUM 40 MG PO TBEC
40.0000 mg | DELAYED_RELEASE_TABLET | Freq: Every day | ORAL | Status: DC
  Filled 2020-10-11 (×2): qty 1

## 2020-10-11 MED ORDER — ONDANSETRON 4 MG PO TBDP
4.0000 mg | ORAL_TABLET | Freq: Three times a day (TID) | ORAL | Status: DC | PRN
  Administered 2020-10-11: 4 mg via ORAL
  Filled 2020-10-11: qty 1

## 2020-10-11 MED ORDER — LIDOCAINE HCL 1 % IJ SOLN
INTRAMUSCULAR | Status: AC
  Filled 2020-10-11: qty 20

## 2020-10-11 MED ORDER — MUPIROCIN 2 % EX OINT
1.0000 "application " | TOPICAL_OINTMENT | Freq: Two times a day (BID) | CUTANEOUS | Status: DC
  Administered 2020-10-11 – 2020-10-13 (×5): 1 via NASAL
  Filled 2020-10-11 (×2): qty 22

## 2020-10-11 MED ORDER — SODIUM CHLORIDE 0.9 % IV BOLUS
500.0000 mL | Freq: Once | INTRAVENOUS | Status: AC
  Administered 2020-10-11: 500 mL via INTRAVENOUS

## 2020-10-11 MED ORDER — MIDODRINE HCL 5 MG PO TABS
10.0000 mg | ORAL_TABLET | Freq: Three times a day (TID) | ORAL | Status: DC
  Administered 2020-10-11 – 2020-10-13 (×3): 10 mg via ORAL
  Filled 2020-10-11 (×5): qty 2

## 2020-10-11 MED ORDER — AZITHROMYCIN 250 MG PO TABS
500.0000 mg | ORAL_TABLET | Freq: Every day | ORAL | Status: DC
  Administered 2020-10-11: 500 mg via ORAL
  Filled 2020-10-11 (×2): qty 2

## 2020-10-11 NOTE — Procedures (Signed)
PROCEDURE SUMMARY:  Successful US guided left thoracentesis. Yielded 1.2 L of blood-tinged fluid. Pt tolerated procedure well. No immediate complications.  Specimen  sent for labs. CXR ordered; no post-procedure pneumothorax identified  EBL < 2 mL  Theresa Duty, NP 10/11/2020 4:38 PM

## 2020-10-11 NOTE — Progress Notes (Addendum)
[facility] Progress Note  Subjective: seen in dialysis, BP's in the 60's-70's  Vitals:   10/10/20 2008 10/10/20 2025 10/11/20 0500 10/11/20 0541  BP: (!) 78/57 (!) 90/56  (!) 87/51  Pulse: (!) 52   (!) 57  Resp: 19 15  18   Temp: 98.1 F (36.7 C)   98.3 F (36.8 C)  TempSrc:      SpO2: 99%   99%  Weight:   67.7 kg     Exam: Gen awake, disheveled, nasal O2 Confused, "getting ready to go to the store" Wet cough No jvd or bruits Chest clear on R, dec'd  L base RRR no MRG Abd soft w/ 1+ ascites GU normal male MS no joint effusions or deformity Ext no LE or other edema, L BKA Neuro nonfocal, gen'd weakness, deconditioning  LUA AVF +bruit,  R IJ TDC        Home meds include asa, lipitor, plavix, pepcid, neurontin 100-200 bid, keppra 500 bid, toprol xl 25 qd, midodrine 5 mg tid, sl ntg prn, brilinta, nepro bid, prn's , vitamins    CT abd - IMPRESSION: 1. Unchanged cirrhosis with increased large volume ascites. 2. Progressive osseous metastatic disease. 3. Unchanged large left and small right pleural effusions. Similar diffuse anasarca.   CXR - increased left pleural effusion and adjacent airspace disease. This could represent atelectasis or developing infection/aspiration    OP HD: [place] [facility] - records pending      LUA AVF and RIJ TDC   Assessment/ Plan: Acute resp failure/ hypoxia - large L effusion and ascites main issue most likely. CXR w/ layering effusion on L which is not edema. With low BP's will not be able to effect fluid removal w/ HD.  Hypotension - BP's very low this am, will not be able to dialyze unless BP's > 80-85.  Likely related to LVP of 4 L yest. Will send back to room, bolus 500 saline and IV alb 25 gm x 1. If BP's improve will run him later today on HD. Will ^midodrine 10 tid.  Cirrhosis/ ascites - sp LVP 8/23 4.2 L Chronic syst/ diast CHF - last EF 20-25% Metastatic prostate cancer Dementia Deconditioned - SNF dependent ESRD -  on HD in [place]. Poor candidate for CRRT given advanced cancer, cirrhosis, CVA , FTT. Need palliative consult, pt has MSOF w/ hypotension and is unsafe to dialysis at this time w/o a DNR order. Could not reach family. Will consult pall care for a meeting to discuss these issues.  Anemia - Hb 10-12 here, no esa needed MBD ckd - Ca 8.9 ok, get phos, get meds H/o CVA HTN - would not give any BP lowering meds (on metoprolol xl at home it appears) Seizure d/o - on keppra       Rob Nahdia Doucet 10/11/2020, 8:03 AM   Recent Labs  Lab 10/09/20 1118 10/09/20 1125 10/10/20 0147  K 4.6 4.5 5.3*  BUN 33* 34* 42*  CREATININE 4.94* 4.70* 5.25*  CALCIUM 8.9  --  8.5*  HGB 10.6* 12.9* 10.1*    Inpatient medications:  atorvastatin  80 mg Oral QHS   Chlorhexidine Gluconate Cloth  6 each Topical Q0600   docusate sodium  100 mg Oral Daily   feeding supplement (NEPRO CARB STEADY)  237 mL Oral BID BM   ferrous gluconate  324 mg Oral Q breakfast   gabapentin  100-200 mg Oral BID   insulin aspart  0-6 Units Subcutaneous TID WC  levETIRAcetam  500 mg Oral BID   midodrine  5 mg Oral TID WC   multivitamin  1 tablet Oral Daily   pantoprazole (PROTONIX) IV  40 mg Intravenous Q24H   sodium chloride flush  3 mL Intravenous Q12H    sodium chloride     azithromycin (ZITHROMAX) 500 MG IVPB (Vial-Mate Adaptor) 500 mg (10/10/20 1455)   cefTRIAXone (ROCEPHIN)  IV 1 g (10/10/20 1415)   sodium chloride, acetaminophen **OR** acetaminophen, lidocaine, metoprolol succinate, nitroGLYCERIN, sodium chloride flush

## 2020-10-11 NOTE — Progress Notes (Signed)
Patient returned from HD.  HD not completed due to low B/P,  Primary MD notified.  See NO to follow.

## 2020-10-11 NOTE — Progress Notes (Signed)
   10/11/20 0914  Assess: MEWS Score  BP (!) 73/51  Pulse Rate 70  ECG Heart Rate 70  Resp 18  Level of Consciousness Alert  SpO2 96 %  O2 Device Nasal Cannula  Patient Activity (if Appropriate) In bed  O2 Flow Rate (L/min) 5 L/min  Assess: MEWS Score  MEWS Temp 0  MEWS Systolic 2  MEWS Pulse 0  MEWS RR 0  MEWS LOC 0  MEWS Score 2  MEWS Score Color Yellow  Assess: if the MEWS score is Yellow or Red  Were vital signs taken at a resting state? Yes  Early Detection of Sepsis Score *See Row Information* Low  MEWS guidelines implemented *See Row Information* Yes  Treat  MEWS Interventions Administered scheduled meds/treatments;Escalated (See documentation below)  Pain Scale 0-10  Pain Score 0  Take Vital Signs  Increase Vital Sign Frequency  Yellow: Q 2hr X 2 then Q 4hr X 2, if remains yellow, continue Q 4hrs  Escalate  MEWS: Escalate Yellow: discuss with charge nurse/RN and consider discussing with provider and RRT  Notify: Charge Nurse/RN  Name of Charge Nurse/RN Notified A. Pugh, RN  Date Charge Nurse/RN Notified 10/11/20  Time Charge Nurse/RN Notified 0830  Notify: Provider  Provider Name/Title Barb Merino, MD  Date Provider Notified 10/09/20  Time Provider Notified 0830  Notification Type Page  Notification Reason Change in status  Provider response See new orders  Date of Provider Response 10/11/20  Time of Provider Response [id number]  Document  Patient Outcome Not stable and remains on department;Other (Comment) (Will conitnue to monitor.)  Progress note created (see row info) Yes

## 2020-10-11 NOTE — NC FL2 (Signed)
[place] LEVEL OF CARE SCREENING TOOL     IDENTIFICATION  Patient Name: George Burgess Birthdate: [birth year] Sex: male Admission Date (Current Location): 10/09/2020  [place] and [place] Number:  Kathleen Argue [phone] [place] and Address:  [facility], [place] [address] [place], [place] [address]      Provider Number: [id number]  Attending Physician Name and Address:  Barb Merino, MD  Relative Name and Phone Number:  Rayhan, [place]   [phone]    Current Level of Care: Hospital Recommended Level of Care: [place] Prior Approval Number:    Date Approved/Denied:   PASRR Number: [id number]  Discharge Plan: SNF    Current Diagnoses: Patient Active Problem List   Diagnosis Date Noted   Type 2 diabetes mellitus with chronic kidney disease on chronic dialysis ([place]) 10/09/2020   Acute respiratory failure with hypoxia ([place]) 10/09/2020   Cirrhosis of liver with ascites ([place]) 10/09/2020   Pleural effusion, left 10/09/2020   Prostate cancer ([place]) 05/11/2020   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)    Chronic combined systolic (congestive) and diastolic (congestive) heart failure (HCC)    PVD (peripheral vascular disease) ([place])    History of stroke    Seizure ([place]) 09/11/2017   ESRD on hemodialysis ([place]) 09/11/2017   Elevated troponin 09/11/2017   Atrial fibrillation, chronic 09/11/2017   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[place]) 09/11/2017   Essential hypertension 09/11/2017   Hypoglycemia 09/11/2017   Syncope 09/10/2017   Blindness    Bloody stools    Encounter for nasogastric (NG) tube placement    Lower GI bleed    Malnutrition of moderate  degree 09/05/2016   Acute GI bleeding 09/04/2016    Orientation RESPIRATION BLADDER Height & Weight     Self  O2 Incontinent Weight: 149 lb 4 oz (67.7 kg) Height:     BEHAVIORAL SYMPTOMS/MOOD NEUROLOGICAL BOWEL NUTRITION STATUS    Convulsions/Seizures Incontinent Diet (see discharge summary)  AMBULATORY STATUS COMMUNICATION OF NEEDS Skin   Total Care Verbally Normal                       Personal Care Assistance Level of Assistance  Bathing, Feeding, Dressing, Total care Bathing Assistance: Maximum assistance Feeding assistance: Maximum assistance Dressing Assistance: Maximum assistance Total Care Assistance: Maximum assistance   Functional Limitations Info  Sight, Hearing, Speech Sight Info: Adequate Hearing Info: Adequate Speech Info: Adequate    SPECIAL CARE FACTORS FREQUENCY                       Contractures Contractures Info: Not present    Additional Factors Info  Code Status, Allergies, Insulin Sliding Scale Code Status Info: full Allergies Info: NKA   Insulin Sliding Scale Info: Novolog, 0-6 units 3x day with meals       Current Medications (10/11/2020):  This is the current hospital active medication list Current Facility-Administered Medications  Medication Dose Route Frequency Provider Last Rate Last Admin   0.9 %  sodium chloride infusion  250 mL Intravenous PRN Orma Flaming, MD       acetaminophen (TYLENOL) tablet 650 mg  650 mg  Oral Q6H PRN Orma Flaming, MD   650 mg at 10/10/20 7425   Or   acetaminophen (TYLENOL) suppository 650 mg  650 mg Rectal Q6H PRN Orma Flaming, MD       atorvastatin (LIPITOR) tablet 80 mg  80 mg Oral QHS Orma Flaming, MD   80 mg at 10/10/20 2136   azithromycin (ZITHROMAX) tablet 500 mg  500 mg Oral Daily Rozann Lesches, RPH   500 mg at 10/11/20 1105   cefTRIAXone (ROCEPHIN) 1 g in sodium chloride 0.9 % 100 mL IVPB  1 g Intravenous Q24H Orma Flaming, MD 200 mL/hr at 10/10/20 1415 1 g at 10/10/20 1415    Chlorhexidine Gluconate Cloth 2 % PADS 6 each  6 each Topical Q0600 Roney Jaffe, MD   6 each at 10/10/20 0543   docusate sodium (COLACE) capsule 100 mg  100 mg Oral Daily Orma Flaming, MD   100 mg at 10/10/20 9563   feeding supplement (NEPRO CARB STEADY) liquid 237 mL  237 mL Oral BID BM Orma Flaming, MD   237 mL at 10/10/20 1415   ferrous gluconate (FERGON) tablet 324 mg  324 mg Oral Q breakfast Orma Flaming, MD   324 mg at 10/11/20 1104   gabapentin (NEURONTIN) capsule 100-200 mg  100-200 mg Oral BID Orma Flaming, MD   200 mg at 10/11/20 1106   insulin aspart (novoLOG) injection 0-6 Units  0-6 Units Subcutaneous TID WC Orma Flaming, MD       levETIRAcetam (KEPPRA) tablet 500 mg  500 mg Oral BID Orma Flaming, MD   500 mg at 10/11/20 1106   lidocaine (XYLOCAINE) 1 % (with pres) injection   Infiltration PRN Jacqualine Mau, NP   10 mL at 10/10/20 1252   metoprolol succinate (TOPROL-XL) 24 hr tablet 12.5 mg  12.5 mg Oral Daily PRN Orma Flaming, MD       midodrine (PROAMATINE) tablet 10 mg  10 mg Oral TID WC Roney Jaffe, MD       multivitamin (RENA-VIT) tablet 1 tablet  1 tablet Oral Daily Orma Flaming, MD   1 tablet at 10/11/20 1106   nitroGLYCERIN (NITROSTAT) SL tablet 0.4 mg  0.4 mg Sublingual Q5 Min x 3 PRN Orma Flaming, MD       ondansetron (ZOFRAN-ODT) disintegrating tablet 4 mg  4 mg Oral Q8H PRN Barb Merino, MD   4 mg at 10/11/20 1108   [START ON 10/12/2020] pantoprazole (PROTONIX) EC tablet 40 mg  40 mg Oral Daily Rozann Lesches, RPH       sodium chloride flush (NS) 0.9 % injection 3 mL  3 mL Intravenous Q12H Orma Flaming, MD   3 mL at 10/11/20 1045   sodium chloride flush (NS) 0.9 % injection 3 mL  3 mL Intravenous PRN Orma Flaming, MD         Discharge Medications: Please see discharge summary for a list of discharge medications.  Relevant Imaging Results:  Relevant Lab Results:   Additional Information SS#: [id number]  Joanne Chars,  LCSW

## 2020-10-11 NOTE — Progress Notes (Signed)
MD in to see patient.  Appears rhonchus, and drowsy / lethargic, but lung sounds appear to be at baseline.  Is confused.

## 2020-10-11 NOTE — Progress Notes (Signed)
PROGRESS NOTE    George Burgess  FAO:[id number] DOB: 03/27/1950 DOA: 10/09/2020 PCP: Caprice Renshaw, MD    Brief Narrative:  70 year old gentleman with history of chronic atrial fibrillation, hypertension, hyperlipidemia, ESRD on hemodialysis Monday Wednesday Friday, type 2 diabetes, chronic combined heart failure, dementia, epilepsy brought from nursing home with vomiting and shortness of breath.  Also noted coffee-ground emesis at nursing home.  In the emergency room, hemodynamically stable.  100% on 2 L nasal cannula.  CT scan abdomen pelvis with unchanged cirrhosis, large volume ascites, progressive osseous metastatic disease, unchanged large left and small right pleural effusions.  Diffuse anasarca.  Given Protonix, Rocephin and azithromycin in the emergency room and we were asked to admit.   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  Keep on oxygen to keep saturation more than 90%. Large left pleural effusion present chronically, will attempt thoracentesis for symptom relief. Large ascites, paracentesis done.  Transudate. Hopefully decreasing the fluid will help with the breathing. Unable to achieve fluid removal with dialysis as he is not tolerating dialysis.  Cirrhosis of liver with ascites: In the setting of cirrhosis and  metastatic prostate cancer as well as ESRD.  Patient also with severely reduced systolic and diastolic function.  Suspected pneumonia: Less likely.  Will check thoracentesis fluid.  Continue antibiotics.  Hematemesis: Known history of cirrhosis and worsening ascites.  Expected portal hypertension and esophageal varices.  Hold aspirin and Plavix.  Continue Protonix.  Hemoglobin remains  stable.  Since symptoms improved, no benefits of upper GI endoscopy.  Chronic atrial fibrillation: Rate controlled.  On metoprolol.  Eliquis on hold.  Chronic combined heart failure: Ejection fraction 20%.  Volume control with dialysis.  ESRD on hemodialysis: Blood pressure low.  Unable to undergo dialysis today.  Palliative care discussion underway.  Treating with albumin and IV fluids today.  Type 2 diabetes with ESRD: On sliding scale insulin today.  Dementia: Symptomatic treatment.  On Keppra for seizure.  DVT prophylaxis: SCDs Start: 10/09/20 1449   Code Status: Full code Family Communication: None.  Unable.  Nephrology and palliative also trying to reach family. Disposition Plan: Status is: Inpatient  Remains inpatient appropriate because:Inpatient level of care appropriate due to severity of illness  Dispo: The patient is from: SNF              Anticipated d/c is to: SNF              Patient currently is not medically stable to d/c.   Difficult to place patient No         Consultants:  Nephrology Palliative care  Procedures:  None  Antimicrobials:  Anti-infectives (From admission, onward)    Start     Dose/Rate Route Frequency Ordered Stop   10/11/20 1030  azithromycin (ZITHROMAX) tablet 500 mg  500 mg Oral Daily 10/11/20 0943 10/14/20 0959   10/10/20 1315  cefTRIAXone (ROCEPHIN) 1 g in sodium chloride 0.9 % 100 mL IVPB        1 g 200 mL/hr over 30 Minutes Intravenous Every 24 hours 10/09/20 1940     10/10/20 1315  azithromycin (ZITHROMAX) 500 mg in sodium chloride 0.9 %  250 mL IVPB  Status:  Discontinued        500 mg 250 mL/hr over 60 Minutes Intravenous Every 24 hours 10/09/20 1940 10/11/20 0943   10/09/20 1315  cefTRIAXone (ROCEPHIN) 1 g in sodium chloride 0.9 % 100 mL IVPB        1 g 200 mL/hr over 30 Minutes Intravenous  Once 10/09/20 1313 10/09/20 1423   10/09/20 1315  azithromycin (ZITHROMAX) 500 mg in sodium chloride 0.9 % 250 mL IVPB        500 mg 250 mL/hr over 60 Minutes Intravenous  Once 10/09/20 1313 10/09/20 1527          Subjective: Patient seen and examined.  Overnight blood pressure was low.  Patient is always tired and lethargic.  This is probably his baseline.  Poor historian.  He tells me he is fine. Some improvement in his blood pressure with 500 mL IV fluids and 1 dose of albumin.  Denies any nausea vomiting chest pain or abdominal pain.  Objective: Vitals:   10/11/20 0739 10/11/20 0744 10/11/20 0914 10/11/20 1004  BP: (!) 73/50 (!) 63/51 (!) 73/51 (!) 87/51  Pulse: 68 80 70 77  Resp:   18   Temp:      TempSrc:      SpO2:   96% 92%  Weight:        Intake/Output Summary (Last 24 hours) at 10/11/2020 1153 Last data filed at 10/11/2020 0930 Gross per 24 hour  Intake 456.2 ml  Output --  Net 456.2 ml   Filed Weights   10/11/20 0500  Weight: 67.7 kg    Examination:  General: Chronically sick looking.  Debilitated gentleman on 1 to 2 L oxygen.  Not in any distress while resting. Cardiovascular: S1-S2 normal.  Ejection murmur present.  No other added sounds. Respiratory: No air entry on the left lower lung fields.   He has a wet raspy voice and conducted airway sounds. Gastrointestinal: Distended.  Tympanic. Bowel sounds present. Ext:  AV fistula left upper extremity. Permacath present right IJ. BKA left leg with clean stump. Neuro: Generalized weakness.  Moves all extremities.     Data Reviewed: I have personally reviewed following labs and imaging studies  CBC: Recent Labs  Lab 10/09/20 1118  10/09/20 1125 10/10/20 0147  WBC 8.1  --  7.0  NEUTROABS 6.2  --   --   HGB 10.6* 12.9* 10.1*  HCT 33.0* 38.0* 31.2*  MCV 95.9  --  97.8  PLT 177  --  381*   Basic Metabolic Panel: Recent Labs  Lab 10/09/20 1118 10/09/20 1125 10/10/20 0147  NA 137 137 136  K 4.6 4.5 5.3*  CL 95* 95* 98  CO2 29  --  25  GLUCOSE 96 92 90  BUN 33* 34* 42*  CREATININE 4.94* 4.70* 5.25*  CALCIUM 8.9  --  8.5*   GFR: Estimated Creatinine Clearance: 12.5 mL/min (A) (by C-G formula based on SCr of 5.25 mg/dL (H)). Liver Function Tests: Recent Labs  Lab 10/09/20 1118 10/10/20 0147  AST 35 44*  ALT 23 24  ALKPHOS 164* 153*  BILITOT  0.9 1.0  PROT 6.6 6.0*  ALBUMIN 2.9* 2.6*   Recent Labs  Lab 10/09/20 1118 10/10/20 1347  LIPASE 48  --   AMYLASE  --  111*   No results for input(s): AMMONIA in the last 168 hours. Coagulation Profile: No results for input(s): INR, PROTIME in the last 168 hours. Cardiac Enzymes: No results for input(s): CKTOTAL, CKMB, CKMBINDEX, TROPONINI in the last 168 hours. BNP (last 3 results) No results for input(s): PROBNP in the last 8760 hours. HbA1C: Recent Labs    10/09/20 1118  HGBA1C 6.6*   CBG: Recent Labs  Lab 10/10/20 1209 10/10/20 1557 10/10/20 2015 10/11/20 0652 10/11/20 0851  GLUCAP 76 102* 93 83 78   Lipid Profile: No results for input(s): CHOL, HDL, LDLCALC, TRIG, CHOLHDL, LDLDIRECT in the last 72 hours. Thyroid Function Tests: No results for input(s): TSH, T4TOTAL, FREET4, T3FREE, THYROIDAB in the last 72 hours. Anemia Panel: No results for input(s): VITAMINB12, FOLATE, FERRITIN, TIBC, IRON, RETICCTPCT in the last 72 hours. Sepsis Labs: Recent Labs  Lab 10/09/20 1739 10/10/20 0147 10/11/20 0325  PROCALCITON 0.78 1.35 1.87    Recent Results (from the past 240 hour(s))  SARS CORONAVIRUS 2 (TAT 6-24 HRS) Nasopharyngeal Nasopharyngeal Swab     Status: None   Collection Time: 10/09/20  1:45 PM   Specimen: Nasopharyngeal Swab   Result Value Ref Range Status   SARS Coronavirus 2 NEGATIVE NEGATIVE Final    Comment: (NOTE) SARS-CoV-2 target nucleic acids are NOT DETECTED.  The SARS-CoV-2 RNA is generally detectable in upper and lower respiratory specimens during the acute phase of infection. Negative results do not preclude SARS-CoV-2 infection, do not rule out co-infections with other pathogens, and should not be used as the sole basis for treatment or other patient management decisions. Negative results must be combined with clinical observations, patient history, and epidemiological information. The expected result is Negative.  Fact Sheet for Patients: SugarRoll.be  Fact Sheet for Healthcare Providers: https://www.woods-mathews.com/  This test is not yet approved or cleared by the [place] FDA and  has been authorized for detection and/or diagnosis of SARS-CoV-2 by FDA under an Emergency Use Authorization (EUA). This EUA will remain  in effect (meaning this test can be used) for the duration of the COVID-19 declaration under Se ction 564(b)(1) of the Act, 21 U.S.C. section 360bbb-3(b)(1), unless the authorization is terminated or revoked sooner.  Performed at [facility], [place] [address], [place], [place] [address]   Culture, body fluid w Gram Stain-bottle     Status: None (Preliminary result)   Collection Time: 10/10/20  1:40 PM   Specimen: Fluid  Result Value Ref Range Status   Specimen Description FLUID PERITONEAL ABDOMEN  Final   Special Requests BOTTLES DRAWN AEROBIC AND ANAEROBIC  Final   Culture   Final    NO GROWTH < 24 HOURS Performed at [facility], [place] [address], [place], [place] [address]    Report Status PENDING  Incomplete  Gram stain     Status: None   Collection Time: 10/10/20  1:40 PM   Specimen: Fluid  Result Value Ref Range Status   Specimen Description FLUID PERITONEAL ABDOMEN  Final   Special Requests NONE   Final   Gram Stain   Final    WBC PRESENT, PREDOMINANTLY MONONUCLEAR NO ORGANISMS SEEN CYTOSPIN SMEAR Performed at [facility], [place] [address] [place], [place] [address]    Report Status 10/10/2020 FINAL  Final  Radiology Studies: IR Paracentesis  Result Date: 10/10/2020 INDICATION: Patient with history of atrial fibrillation, end-stage renal disease. Found to have ascites. Request is for therapeutic and diagnostic paracentesis EXAM: ULTRASOUND GUIDED THERAPEUTIC AND DIAGNOSTIC PARACENTESIS MEDICATIONS: Lidocaine 1% 10 mL COMPLICATIONS: None immediate. PROCEDURE: Informed written consent was obtained from the patient after a discussion of the risks, benefits and alternatives to treatment. A timeout was performed prior to the initiation of the procedure. Initial ultrasound scanning demonstrates a moderate amount of ascites within the right lower abdominal quadrant. The right lower abdomen was prepped and draped in the usual sterile fashion. 1% lidocaine was used for local anesthesia. Following this, a 19 gauge, 7-cm, Yueh catheter was introduced. An ultrasound image was saved for documentation purposes. The paracentesis was performed. The catheter was removed and a dressing was applied. The patient tolerated the procedure well without immediate post procedural complication. FINDINGS: A total of approximately 4.2 L of straw-colored fluid was removed. Samples were sent to the laboratory as requested by the clinical team. IMPRESSION: Successful ultrasound-guided therapeutic and diagnostic paracentesis yielding 4.2 liters of peritoneal fluid. Read by: Rushie Nyhan, NP Electronically Signed   By: Jacqulynn Cadet M.D.   On: 10/10/2020 13:41        Scheduled Meds:  atorvastatin  80 mg Oral QHS   azithromycin  500 mg Oral Daily   Chlorhexidine Gluconate Cloth  6 each Topical Q0600   docusate sodium  100 mg Oral Daily   feeding supplement (NEPRO CARB STEADY)  237 mL Oral BID  BM   ferrous gluconate  324 mg Oral Q breakfast   gabapentin  100-200 mg Oral BID   insulin aspart  0-6 Units Subcutaneous TID WC   levETIRAcetam  500 mg Oral BID   midodrine  10 mg Oral TID WC   multivitamin  1 tablet Oral Daily   [START ON 10/12/2020] pantoprazole  40 mg Oral Daily   sodium chloride flush  3 mL Intravenous Q12H   Continuous Infusions:  sodium chloride     cefTRIAXone (ROCEPHIN)  IV 1 g (10/10/20 1415)     LOS: 2 days    Time spent: 32 minutes    Barb Merino, MD [facility] Pager [phone]

## 2020-10-11 NOTE — Evaluation (Signed)
Occupational Therapy Evaluation Patient Details Name: George Burgess MRN: [id number] DOB: 30-Oct-1950 Today's Date: 10/11/2020    History of Present Illness 70 year old gentleman with history of chronic atrial fibrillation, hypertension, hyperlipidemia, ESRD on hemodialysis Monday Wednesday Friday, type 2 diabetes, chronic combined heart failure, dementia, epilepsy brought from nursing home with vomiting and shortness of breath. CT scan abdomen pelvis with unchanged cirrhosis, large volume ascites, progressive osseous metastatic disease, unchanged large left and small right pleural effusions. Patient admitted with acute respiratory failure.   Clinical Impression   [name] is a 70 year old man who presents with above medical history. On evaluation he required min assist to transfer into sitting but unable to stand with assistance of one. Patient requires assistance for all ADLs and set up for feeding. Patient is a long term care resident and has assistance for transfers and ADLs. Patient appears to be near his recommend. Recommend return to facility. No acute care OT needs.    Follow Up Recommendations  SNF    Equipment Recommendations  None recommended by OT    Recommendations for Other Services       Precautions / Restrictions Precautions Precautions: Fall Precaution Comments: L BKA      Mobility Bed Mobility Overal bed mobility: Needs Assistance Bed Mobility: Supine to Sit;Sit to Supine     Supine to sit: Min assist Sit to supine: Min assist   General bed mobility comments: min assist for hand hold to transfer to side of bed.    Transfers Overall transfer level: Needs assistance Equipment used: Rolling walker (2 wheeled) Transfers: Sit to/from Stand           General transfer comment: attempted sit to stand x 3 with increasing height of bed. Patient unable to assist with powering up through RLE and unabel to rise.    Balance Overall balance assessment:  Needs assistance Sitting-balance support: No upper extremity supported Sitting balance-Leahy Scale: Fair Sitting balance - Comments: able to side edge of bed and lean over to attempt to don sock, min guard needed                                   ADL either performed or assessed with clinical judgement   ADL Overall ADL's : At baseline                                       General ADL Comments: needs assistance with all ADLs and setup for feeding.     Vision Patient Visual Report: No change from baseline       Perception     Praxis      Pertinent Vitals/Pain Pain Assessment: No/denies pain     Hand Dominance Right   Extremity/Trunk Assessment Upper Extremity Assessment Upper Extremity Assessment: RUE deficits/detail;LUE deficits/detail RUE Deficits / Details: WFL ROM, decreased finger ROM in 4th and 5th digit RUE Sensation: history of peripheral neuropathy RUE Coordination: decreased fine motor LUE Deficits / Details: WFL ROM, decreased finger ROM LUE Coordination: decreased fine motor   Lower Extremity Assessment Lower Extremity Assessment: Defer to PT evaluation   Cervical / Trunk Assessment Cervical / Trunk Assessment: Normal   Communication Communication Communication: Expressive difficulties   Cognition Arousal/Alertness: Awake/alert Behavior During Therapy: WFL for tasks assessed/performed Overall Cognitive Status: History of cognitive impairments - at baseline  General Comments: hx of dementia, alert to self only.   General Comments       Exercises     Shoulder Instructions      Home Living Family/patient expects to be discharged to:: Skilled nursing facility                             Home Equipment: Gilford Rile - 2 wheels   Additional Comments: Patient is a long term resident. Patient has dementia and is not a reliable historian.      Prior  Functioning/Environment Level of Independence: Needs assistance        Comments: Patient from [place] [facility]        OT Problem List: Decreased activity tolerance;Decreased cognition;Decreased safety awareness;Decreased knowledge of use of DME or AE;Decreased knowledge of precautions;Decreased strength      OT Treatment/Interventions:      OT Goals(Current goals can be found in the care plan section) Acute Rehab OT Goals OT Goal Formulation: All assessment and education complete, DC therapy  OT Frequency:     Barriers to D/C:            Co-evaluation              AM-PAC OT "6 Clicks" Daily Activity     Outcome Measure Help from another person eating meals?: A Little Help from another person taking care of personal grooming?: A Little Help from another person toileting, which includes using toliet, bedpan, or urinal?: Total Help from another person bathing (including washing, rinsing, drying)?: A Lot Help from another person to put on and taking off regular upper body clothing?: A Lot Help from another person to put on and taking off regular lower body clothing?: Total 6 Click Score: 12   End of Session Equipment Utilized During Treatment: Rolling walker Nurse Communication:  (okay to see)  Activity Tolerance: Patient tolerated treatment well Patient left: in bed;with call bell/phone within reach;with bed alarm set  OT Visit Diagnosis: Muscle weakness (generalized) (M62.81)                Time: 7867-5449 OT Time Calculation (min): 16 min Charges:  OT General Charges $OT Visit: 1 Visit OT Evaluation $OT Eval Low Complexity: 1 Low  Cheryl Stabenow, OTR/L [place]  Office [phone] Pager: [place] 10/11/2020, 1:38 PM

## 2020-10-11 NOTE — Progress Notes (Signed)
Physical Therapy Evaluation Patient Details Name: George Burgess MRN: [id number] DOB: [birth year] Today's Date: 10/11/2020   History of Present Illness  70 year old gentleman admitted 8/22  with acute respiratory failure. Also with nausea, vomiting and SOB.  CT scan abdomen pelvis with unchanged cirrhosis, large volume ascites, progressive osseous metastatic disease, unchanged large left and small right pleural effusions. PMH: atrial fibrillation, hypertension, hyperlipidemia, ESRD on hemodialysis Monday Wednesday Friday, type 2 diabetes, chronic combined heart failure, dementia, epilepsy  Clinical Impression  Pt admitted with above diagnosis. Able to perform bed level eval only due to soft BPs.  Nurse is aware. Pt can roll and assist with movement.  Per chart pt is transfers only PTA.  Will progress pt as able.  Pt currently with functional limitations due to the deficits listed below (see PT Problem List). Pt will benefit from skilled PT to increase their independence and safety with mobility to allow discharge to the venue listed below.       Follow Up Recommendations SNF    Equipment Recommendations  None recommended by PT    Recommendations for Other Services       Precautions / Restrictions Precautions Precautions: Fall Precaution Comments: L BKA      Mobility  Bed Mobility Overal bed mobility: Needs Assistance Bed Mobility: Rolling Rolling: Min assist   Supine to sit: Total assist;+2 for safety/equipment Sit to supine: Min assist   General bed mobility comments: Pts BP 81/61 and nurse came in and stated they are working on his pressures.  Given this, decided to progressively incline pt to chair position and not sit EOB.  Noted pt with BM therefore cleaned pt and then scooted him up in bed and placed the bed to 41 degrees position with BP on departure at 87/56.    Transfers Overall transfer level: Needs assistance Equipment used: Rolling walker (2 wheeled) Transfers: Sit  to/from Stand           General transfer comment: BP soft  Ambulation/Gait                Stairs            Wheelchair Mobility    Modified Rankin (Stroke Patients Only)       Balance Overall balance assessment: Needs assistance Sitting-balance support: No upper extremity supported Sitting balance-Leahy Scale: Fair Sitting balance - Comments: able to side edge of bed and lean over to attempt to don sock, min guard needed                                     Pertinent Vitals/Pain Pain Assessment: No/denies pain    Home Living Family/patient expects to be discharged to:: Skilled nursing facility   Available Help at Discharge: [place] Type of Home: [place] Equipment: Gil[employer] Rile - 2 wheels Additional Comments: Patient is a long term resident. Patient has dementia and is not a reliable historian.    Prior Function Level of Independence: Needs assistance         Comments: Patient from [facility]     Hand Dominance   Dominant Hand: Right    Extremity/Trunk Assessment   Upper Extremity Assessment Upper Extremity Assessment: Defer to OT evaluation RUE Deficits / Details: WFL ROM, decreased finger ROM in 4th and 5th digit RUE Sensation: history of peripheral neuropathy RUE Coordination: decreased fine motor  LUE Deficits / Details: WFL ROM, decreased finger ROM LUE Coordination: decreased fine motor    Lower Extremity Assessment Lower Extremity Assessment: Generalized weakness    Cervical / Trunk Assessment Cervical / Trunk Assessment: Normal  Communication   Communication: Expressive difficulties  Cognition Arousal/Alertness: Awake/alert Behavior During Therapy: WFL for tasks assessed/performed Overall Cognitive Status: History of cognitive impairments - at baseline                                 General Comments: hx of dementia, alert to self only.       General Comments General comments (skin integrity, edema, etc.): O2 5L with sats 100%.  See BP above    Exercises General Exercises - Lower Extremity Ankle Circles/Pumps: AROM;Both;5 reps;Supine Heel Slides: AROM;Both;5 reps;Supine   Assessment/Plan    PT Assessment Patient needs continued PT services  PT Problem List Decreased activity tolerance;Decreased balance;Decreased mobility;Decreased knowledge of use of DME;Decreased safety awareness;Decreased knowledge of precautions;Cardiopulmonary status limiting activity       PT Treatment Interventions DME instruction;Gait training;Functional mobility training;Therapeutic activities;Therapeutic exercise;Balance training;Patient/family education    PT Goals (Current goals can be found in the Care Plan section)  Acute Rehab PT Goals Patient Stated Goal: to go back to SNF PT Goal Formulation: Patient unable to participate in goal setting Time For Goal Achievement: 10/25/20 Potential to Achieve Goals: Fair    Frequency Min 2X/week   Barriers to discharge Decreased caregiver support      Co-evaluation               AM-PAC PT "6 Clicks" Mobility  Outcome Measure Help needed turning from your back to your side while in a flat bed without using bedrails?: A Little Help needed moving from lying on your back to sitting on the side of a flat bed without using bedrails?: Total Help needed moving to and from a bed to a chair (including a wheelchair)?: Total Help needed standing up from a chair using your arms (e.g., wheelchair or bedside chair)?: Total Help needed to walk in hospital room?: Total Help needed climbing 3-5 steps with a railing? : Total 6 Click Score: 8    End of Session Equipment Utilized During Treatment: Oxygen Activity Tolerance: Patient limited by fatigue Patient left: in bed;with call bell/phone within reach;with bed alarm set Nurse Communication: Mobility status PT Visit Diagnosis: Muscle weakness  (generalized) (M62.81)    Time: 1020-1053 PT Time Calculation (min) (ACUTE ONLY): 33 min   Charges:   PT Evaluation $PT Eval Moderate Complexity: 1 Mod PT Treatments $Therapeutic Activity: 8-22 mins        Arayla Kruschke M,PT Acute Rehab Services [phone] (pager)   Alvira Philips 10/11/2020, 1:52 PM

## 2020-10-11 NOTE — Progress Notes (Signed)
Palliative:  Chart reviewed extensively. Patient was seen by outpatient palliative provider in 2021 - goals were stated as full code/full scope.  Assessed patient - incredibly confused - thought he was in [employer]. Obviously unable to participate in goals of care discussions.  Attempted to call brother - no answer. Voicemail left with call back number.  Attempted to call sister - no answer and voicemail box not set up.  Will continue attempts to get in touch with family - if any other staff connect with family or family arrives at bedside please contact me.   Juel Burrow, DNP, AGNP-C Palliative Medicine Team Team Phone # [phone]  Pager # [phone]  NO CHARGE

## 2020-10-12 DIAGNOSIS — N186 End stage renal disease: Secondary | ICD-10-CM

## 2020-10-12 DIAGNOSIS — I5042 Chronic combined systolic (congestive) and diastolic (congestive) heart failure: Secondary | ICD-10-CM

## 2020-10-12 DIAGNOSIS — R188 Other ascites: Secondary | ICD-10-CM

## 2020-10-12 DIAGNOSIS — F0150 Vascular dementia without behavioral disturbance: Secondary | ICD-10-CM

## 2020-10-12 DIAGNOSIS — Z7189 Other specified counseling: Secondary | ICD-10-CM

## 2020-10-12 DIAGNOSIS — Z515 Encounter for palliative care: Secondary | ICD-10-CM

## 2020-10-12 DIAGNOSIS — Z992 Dependence on renal dialysis: Secondary | ICD-10-CM

## 2020-10-12 DIAGNOSIS — K7460 Unspecified cirrhosis of liver: Principal | ICD-10-CM

## 2020-10-12 LAB — GLUCOSE, CAPILLARY
Glucose-Capillary: 97 mg/dL (ref 70–99)
Glucose-Capillary: 75 mg/dL (ref 70–99)
Glucose-Capillary: 57 mg/dL — ABNORMAL LOW (ref 70–99)
Glucose-Capillary: 63 mg/dL — ABNORMAL LOW (ref 70–99)
Glucose-Capillary: 77 mg/dL (ref 70–99)
Glucose-Capillary: 126 mg/dL — ABNORMAL HIGH (ref 70–99)

## 2020-10-12 LAB — PATHOLOGIST SMEAR REVIEW: Path Review: INCREASED

## 2020-10-12 LAB — HEPATITIS B SURFACE ANTIBODY, QUANTITATIVE: Hep B S AB Quant (Post): 18 m[IU]/mL (ref >9.9)

## 2020-10-12 MED ORDER — GABAPENTIN 100 MG PO CAPS
200.0000 mg | ORAL_CAPSULE | Freq: Every day | ORAL | Status: DC
  Administered 2020-10-12 – 2020-10-13 (×2): 200 mg via ORAL
  Filled 2020-10-12 (×2): qty 2

## 2020-10-12 MED ORDER — GABAPENTIN 100 MG PO CAPS
100.0000 mg | ORAL_CAPSULE | Freq: Every day | ORAL | Status: DC
  Filled 2020-10-12: qty 1

## 2020-10-12 MED ORDER — DEXTROSE 250 MG/ML IV SOLN: 25.0000 g | Freq: Once | INTRAVENOUS | Status: DC

## 2020-10-12 MED ORDER — DEXTROSE 10 % IV SOLN: INTRAVENOUS | Status: DC

## 2020-10-12 MED ORDER — DEXTROSE 50 % IV SOLN: 25.0000 g | Freq: Once | INTRAVENOUS | Status: DC

## 2020-10-12 MED ORDER — DEXTROSE 50 % IV SOLN
INTRAVENOUS | Status: AC
  Filled 2020-10-12: qty 50

## 2020-10-12 NOTE — Progress Notes (Signed)
[facility] Progress Note  Subjective: seen in dialysis, BP's in the 80's-90's today.   Vitals:   10/11/20 1617 10/11/20 2006 10/12/20 0529 10/12/20 0900  BP: (!) 85/56 (!) 100/55 (!) 86/63 (!) 88/59  Pulse: 62 62 65 61  Resp: 18 19 18 15   Temp:  97.7 F (36.5 C) 98 F (36.7 C) 98 F (36.7 C)  TempSrc:  Oral  Axillary  SpO2: 92% 91% (!) 89% 96%  Weight:        Exam: Gen awake, disheveled, nasal O2 Wet cough No jvd or bruits Chest clear on R, dec'd  L base RRR no MRG Abd soft w/ 1+ ascites GU normal male MS no joint effusions or deformity Ext no LE or other edema, L BKA Neuro nonfocal, gen'd weakness, deconditioning  LUA AVF +bruit,  R IJ TDC        Home meds include asa, lipitor, plavix, pepcid, neurontin 100-200 bid, keppra 500 bid, toprol xl 25 qd, midodrine 5 mg tid, sl ntg prn, brilinta, nepro bid, prn's , vitamins    CT abd - IMPRESSION: 1. Unchanged cirrhosis with increased large volume ascites. 2. Progressive osseous metastatic disease. 3. Unchanged large left and small right pleural effusions. Similar diffuse anasarca.   CXR - increased left pleural effusion and adjacent airspace disease. This could represent atelectasis or developing infection/aspiration    OP HD: [facility] - records pending      LUA AVF and RIJ TDC   Assessment/ Plan: Acute resp failure/ hypoxia - large L effusion and ascites main issue most likely. CXR w/ layering effusion on L which is not edema. With low BP's will not be able to effect fluid removal w/ HD.  Hypotension - after LVP on 8/22. BP's better in 90's , will attempt to do HD today. Have ^'d midodrine 10 tid.  ESRD - on HD in [facility]. Not CRRT candidate given advanced comorbidities. Has MSOF w/ heart/ liver failure and metastatic cancer. D/w pt's brother yesterday, pt had similar problems 1.5 yrs ago when EOL discussions were held. I told him if BP's are good enough we will attempt to dialyze him, if not will  need to discuss hospice transition.  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 ok, get phos, get meds H/o CVA HTN - would not give any BP lowering meds (on metoprolol xl at home it appears) Seizure d/o - on keppra       Rob Kenleigh Toback 10/12/2020, 11:16 AM   Recent Labs  Lab 10/09/20 1118 10/09/20 1125 10/10/20 0147  K 4.6 4.5 5.3*  BUN 33* 34* 42*  CREATININE 4.94* 4.70* 5.25*  CALCIUM 8.9  --  8.5*  HGB 10.6* 12.9* 10.1*    Inpatient medications:  atorvastatin  80 mg Oral QHS   Chlorhexidine Gluconate Cloth  6 each Topical Q0600   docusate sodium  100 mg Oral Daily   feeding supplement (NEPRO CARB STEADY)  237 mL Oral BID BM   ferrous gluconate  324 mg Oral Q breakfast   [START ON 10/13/2020] gabapentin  100 mg Oral Daily   And   gabapentin  200 mg Oral QHS   insulin aspart  0-6 Units Subcutaneous TID WC   levETIRAcetam  500 mg Oral BID   midodrine  10 mg Oral TID WC  multivitamin  1 tablet Oral Daily   mupirocin ointment  1 application Nasal BID   pantoprazole  40 mg Oral Daily   sodium chloride flush  3 mL Intravenous Q12H    sodium chloride     sodium chloride, acetaminophen **OR** acetaminophen, lidocaine (PF), lidocaine, metoprolol succinate, nitroGLYCERIN, ondansetron, sodium chloride flush

## 2020-10-12 NOTE — Progress Notes (Signed)
Palliative:  Reviewed chart.   Assessed patient - remains confused, less responsive today than yesterday. Obviously unable to participate in goals of care discussions.  Attempted to call brother - no answer. Voicemail left with call back number.  Attempted to call sister - no answer and voicemail box not set up.  Will continue attempts to get in touch with family - if any other staff connect with family or family arrives at bedside please contact me.   Juel Burrow, DNP, AGNP-C Palliative Medicine Team Team Phone # [phone]  Pager # [phone]  NO CHARGE

## 2020-10-12 NOTE — Care Management Important Message (Signed)
Important Message  Patient Details  Name: George Burgess MRN: [id number] Date of Birth: January 04, 1951   Medicare Important Message Given:  Yes     Meilin Brosh [address]ine [address] 10/12/2020, 2:57 PM

## 2020-10-12 NOTE — Consult Note (Signed)
Consultation Note Date: 10/12/2020   Patient Name: George Burgess  DOB: [birth year]  MRN: [id number]  Age / Sex: 70 y.o., male  PCP: George Renshaw, MD Referring Physician: Barb Merino, MD  Reason for Consultation: Establishing goals of care  HPI/Patient Profile: 70 y.o. male  with past medical history of metastatic prostate cancer, cirrhosis of liver, chronic atrial fibrillation, hypertension, hyperlipidemia, ESRD on hemodialysis Monday Wednesday Friday, type 2 diabetes, chronic combined heart failure, dementia, and epilepsy admitted on 10/09/2020 with shortness of breath. Diagnosed with acute respiratory failure likely d/t enlarging ascites and pleural effusion. L side thoracentesis completed with 1.2 L fluid removal. Patient also with severely reduced systolic and diastolic function. Patient unable to tolerate HD d/t soft BP. PMT consulted to discuss George Burgess.   Patient was seen by outpatient palliative provider in 2021 - goals were stated as full code/full scope.  RN reports soft BP today. More confused and lethargic today. Unable to take pills.  Clinical Assessment and Goals of Care: I have reviewed medical records including EPIC notes, labs and imaging, received report from RN, assessed the patient and then met with patient's brother to discuss diagnosis prognosis, GOC, EOL wishes, disposition and options.  I introduced Palliative Medicine as specialized medical care for people living with serious illness. It focuses on providing relief from the symptoms and stress of a serious illness. The goal is to improve quality of life for both the patient and the family.  Patient's brother shares that patient had a history of refusing HD. He also shares about patient's history of homelessness and substance abuse. He tells me that patient required lower extremity amputation about ten years ago and since that time has lived in a SNF.   We discussed patient's  current illness and what it means in the larger context of patient's on-going co-morbidities.  Natural disease trajectory and expectations at EOL were discussed. We discuss patient's multiorgan failure - cardiac, respiratory, renal, and hepatic. Discuss that patient's mental status is also worsening - less responsive today than yesterday.   I attempted to elicit values and goals of care important to the patient.  His brother shares with me that patient has consistently requested full code full scope care in the past when he was able to make his wishes known. I ask him about patient's history of refusing HD and why he thinks he refused it - brother is unsure. I ask patient's brother to consider if patient's wishes would have changed given his decline and multiorgan failure. Patient's brother tells me that patient and his family have belief that all medical care to prolong life should be pursued. We discuss that unfortunately, despite aggressive medical care, it is likely that patient will continue to decline. We discuss that currently patient is unable to receive HD - it is unsafe d/t his low BP.   Encouraged patient's brother to consider DNR/DNI status understanding evidenced based poor outcomes in similar hospitalized patients, as the cause of the arrest is likely associated with chronic/terminal disease rather than a reversible acute cardio-pulmonary event. Patient's brother tells me he would like to continue full code status.   Brother shares with me a hope for improvement. He tells me he will discuss goals of care with other family members however he also shares he does not think any family will agree to anything other than full code/full scope care.   Discussed with brother the importance of continued conversation with family and the medical providers regarding overall plan of care and  treatment options, ensuring decisions are within the context of the patient's values and GOCs.    Questions and  concerns were addressed. The family was encouraged to call with questions or concerns.   Primary Decision Maker NEXT OF KIN - siblings - have been in touch with brother, have number for sister but she has not answered and unable to leave voicemail at number  SUMMARY OF RECOMMENDATIONS   - brother currently requests continued full code/full scope care - discussed concern about poor prognosis with brother - will request PMT member follow up with brother regarding conversation with other family members  Code Status/Advance Care Planning: Full code     Primary Diagnoses: Present on Admission:  Atrial fibrillation, chronic  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George Burgess)  Essential hypertension  Chronic combined systolic (congestive) and diastolic (congestive) heart failure (HCC)  Prostate cancer (George Burgess)  Dementia (George Burgess)  Coronary artery disease with angina pectoris (George Burgess)  Acute respiratory failure with hypoxia (George Burgess)  Cirrhosis of liver with ascites (George Burgess)  Pleural effusion, left   I have reviewed the medical record, interviewed the patient and family, and examined the patient. The following aspects are pertinent.  Past Medical History:  Diagnosis Date   Atrial fibrillation (George Burgess)    a. Chronic Eliquis (CHA2DS2VASc = 6).   Chronic combined systolic (congestive) and diastolic (congestive) heart failure (George Burgess)    a. Previously reported EF of 30%;  b. 08/2016 Echo: EF 40-45%, antsept, inf, infsept HK, Gr3 DD, mod AI/MR, sev dil LA, mild TR, PASP 72mHg.   Dementia (George Burgess    Encephalopathy    End stage renal disease (George Burgess    a. On HD.   Epilepsy, unspecified, not intractable, without status epilepticus (George Burgess   [place]   Essential hypertension    GIB (gastrointestinal bleeding)    a. 08/2016 in setting of Eliquis Rx.   Hyperlipidemia    Pleural effusion    PVD (peripheral vascular disease) ([place]    a. s/p L BKA;  b. Chronic LE wounds.   Stroke  ([place]    Type II diabetes mellitus (George Burgess    Social History   Socioeconomic History   Marital status: Single    Spouse name: Not on file   Number of children: Not on file   Years of education: Not on file   Highest education level: Not on file  Occupational History   Occupation: retired/disabled  Tobacco Use   Smoking status: Former   Smokeless tobacco: Never   Tobacco comments:    Pt thinks he used to smoke cigarettes.  Thinks he quit many years ago.  Vaping Use   Vaping Use: Never used  Substance and Sexual Activity   Alcohol use: No   Drug use: No   Sexual activity: Never  Other Topics Concern   Not on file  Social History Narrative   [facility]  Sed[facility].   Social Determinants of Health   Financial Resource Strain: Not on file  Food Insecurity: Not on file  Transportation Needs: Not on file  Physical Activity: Not on file  Stress: Not on file  Social Connections: Not on file   Family History  Problem Relation Age of Onset   Other Mother        Pt unsure of PMH of family members.   Scheduled Meds:  atorvastatin  80 mg Oral QHS   Chlorhexidine Gluconate Cloth  6 each Topical Q0600   dextrose  25 g Intravenous Once   dextrose       docusate sodium  100 mg Oral Daily   feeding supplement (NEPRO CARB STEADY)  237 mL Oral BID BM   ferrous gluconate  324 mg Oral Q breakfast   [START ON 10/13/2020] gabapentin  100 mg Oral Daily   And   gabapentin  200 mg Oral QHS   insulin aspart  0-6 Units Subcutaneous TID WC   levETIRAcetam  500 mg Oral BID   midodrine  10 mg Oral TID WC   multivitamin  1 tablet Oral Daily   mupirocin ointment  1 application Nasal BID   pantoprazole  40 mg Oral Daily   sodium chloride flush  3 mL Intravenous Q12H   Continuous Infusions:  sodium chloride     dextrose 50 mL/hr at 10/12/20 1706   PRN Meds:.sodium chloride, acetaminophen **OR** acetaminophen, lidocaine (PF), lidocaine, metoprolol succinate, nitroGLYCERIN,  ondansetron, sodium chloride flush No Known Allergies Review of Systems  Unable to perform ROS: Dementia   Physical Exam Constitutional:      General: He is not in acute distress.    Comments: lethargic  Pulmonary:     Effort: Pulmonary effort is normal.  Skin:    General: Skin is warm and dry.  Neurological:     Mental Status: He is disoriented.    Vital Signs: BP (!) 82/61 (BP Location: Right Arm)   Pulse (!) 57   Temp 97.9 F (36.6 C) (Axillary)   Resp 12   Wt 67.7 kg   SpO2 98%   BMI 20.24 kg/m  Pain Scale: 0-10   Pain Score: 0-No pain   SpO2: SpO2: 98 % O2 Device:SpO2: 98 % O2 Flow Rate: .O2 Flow Rate (L/min): 2 L/min  IO: Intake/output summary:  Intake/Output Summary (Last 24 hours) at 10/12/2020 2040 Last data filed at 10/12/2020 1706 Gross per 24 hour  Intake 100 ml  Output --  Net 100 ml    LBM: Last BM Date: 10/10/20 Baseline Weight: Weight: 67.7 kg Most recent weight: Weight: 67.7 kg     Palliative Assessment/Data: PPS 20%    Time Total: 60 minutes Greater than 50%  of this time was spent counseling and coordinating care related to the above assessment and plan.  Juel Burrow, DNP, AGNP-C Palliative Medicine Team [phone] Pager: [phone]

## 2020-10-12 NOTE — Progress Notes (Signed)
PROGRESS NOTE    George Burgess  ZMO:[id number] DOB: 04/13/1950 DOA: 10/09/2020 PCP: Caprice Renshaw, MD    Brief Narrative:  70 year old gentleman with history of chronic atrial fibrillation, hypertension, hyperlipidemia, ESRD on hemodialysis Monday Wednesday Friday, type 2 diabetes, chronic combined heart failure, dementia, epilepsy brought from nursing home with vomiting and shortness of breath.  Also noted coffee-ground emesis at nursing home.  In the emergency room, hemodynamically stable.  100% on 2 L nasal cannula.  CT scan abdomen pelvis with unchanged cirrhosis, large volume ascites, progressive osseous metastatic disease, unchanged large left and small right pleural effusions.  Diffuse anasarca.  Given Protonix, Rocephin and azithromycin in the emergency room and we were asked to admit.   Assessment & Plan:   Principal Problem:   Acute respiratory failure with hypoxia (HCC) Active Problems:   ESRD on hemodialysis (HCC)   Atrial fibrillation, chronic   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HCC)   Essential hypertension   Dementia (HCC)   Chronic combined systolic (congestive) and diastolic (congestive) heart failure (HCC)   History of stroke   Epilepsy ([place])   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)   Prostate cancer (HCC)   Type 2 diabetes mellitus with chronic kidney disease on chronic dialysis ([place])   Cirrhosis of liver with ascites (HCC)   Pleural effusion, left  Acute respiratory failure with hypoxemia: Likely due to enlarging ascites and pleural effusion.  Currently on room air.  Left-sided thoracentesis 1.2 L transudate, cultures pending.  Paracentesis, 4 L transudate cultures pending.  Negative so far.  Keep on oxygen to keep saturation more than 90%.  Cirrhosis of liver with ascites: In the setting of cirrhosis and metastatic prostate cancer as well as ESRD.  Patient also with severely reduced systolic and diastolic function.  End-stage.  Suspected  pneumonia: Less likely.  Will discontinue antibiotics pending culture reports.  Hematemesis: Known history of cirrhosis and worsening ascites.  Expected portal hypertension and esophageal varices.  Hold aspirin and Plavix.  Continue Protonix.  Hemoglobin remains  stable.  Since symptoms improved, no benefits of upper GI endoscopy. Will resume aspirin and Plavix on discharge.  Chronic atrial fibrillation: Rate controlled.  On metoprolol.  Not on anticoagulation.  He is on aspirin and Plavix.  Chronic combined heart failure: Ejection fraction 20%.  Volume control with dialysis.  Hopefully can tolerate dialysis today.  ESRD on hemodialysis: Blood pressure low.  Hopefully can tolerate dialysis today.  Type 2 diabetes with ESRD: On sliding scale insulin today.  Dementia: Symptomatic treatment.  On Keppra for seizure.  DVT prophylaxis: SCDs Start: 10/09/20 1449   Code Status: Full code Family Communication: None.   Disposition Plan: Status is: Inpatient  Remains inpatient appropriate because:Inpatient level of care appropriate due to severity of illness  Dispo: The patient is from: SNF              Anticipated d/c is to: SNF              Patient currently is not medically stable to d/c.   Difficult to place patient No         Consultants:  Nephrology Palliative care  Procedures:  None  Antimicrobials:  Anti-infectives (From admission, onward)    Start     Dose/Rate Route Frequency Ordered Stop   10/11/20 1030  azithromycin (ZITHROMAX) tablet 500 mg  Status:  Discontinued        500 mg Oral Daily 10/11/20 0943 10/12/20 1019  10/10/20 1315  cefTRIAXone (ROCEPHIN) 1 g in sodium chloride 0.9 % 100 mL IVPB  Status:  Discontinued        1 g 200 mL/hr over 30 Minutes Intravenous Every 24 hours 10/09/20 1940 10/12/20 1019   10/10/20 1315  azithromycin (ZITHROMAX) 500 mg in sodium chloride 0.9 % 250 mL IVPB  Status:  Discontinued        500 mg 250 mL/hr over 60 Minutes  Intravenous Every 24 hours 10/09/20 1940 10/11/20 0943   10/09/20 1315  cefTRIAXone (ROCEPHIN) 1 g in sodium chloride 0.9 % 100 mL IVPB        1 g 200 mL/hr over 30 Minutes Intravenous  Once 10/09/20 1313 10/09/20 1423   10/09/20 1315  azithromycin (ZITHROMAX) 500 mg in sodium chloride 0.9 % 250 mL IVPB        500 mg 250 mL/hr over 60 Minutes Intravenous  Once 10/09/20 1313 10/09/20 1527          Subjective: Patient seen and examined.  Sleepy.  Denies any problems.  Denies any nausea vomiting or chest pain.  On room air.  Objective: Vitals:   10/11/20 1617 10/11/20 2006 10/12/20 0529 10/12/20 0900  BP: (!) 85/56 (!) 100/55 (!) 86/63 (!) 88/59  Pulse: 62 62 65 61  Resp: 18 19 18 15   Temp:  97.7 F (36.5 C) 98 F (36.7 C) 98 F (36.7 C)  TempSrc:  Oral  Axillary  SpO2: 92% 91% (!) 89% 96%  Weight:        Intake/Output Summary (Last 24 hours) at 10/12/2020 1021 Last data filed at 10/11/2020 1800 Gross per 24 hour  Intake 600 ml  Output --  Net 600 ml   Filed Weights   10/11/20 0500  Weight: 67.7 kg    Examination:  General: Chronically sick looking.  Debilitated gentleman on room air today.   Cardiovascular: S1-S2 normal.  Ejection murmur present.  No other added sounds. Respiratory: No air entry on the left lower lung fields.   He has a wet raspy voice and conducted airway sounds. Gastrointestinal: Distended.  Tympanic. Bowel sounds present. Ext:  AV fistula left upper extremity. Permacath present right IJ. BKA left leg with clean stump. Neuro: Generalized weakness.  Moves all extremities.     Data Reviewed: I have personally reviewed following labs and imaging studies  CBC: Recent Labs  Lab 10/09/20 1118 10/09/20 1125 10/10/20 0147  WBC 8.1  --  7.0  NEUTROABS 6.2  --   --   HGB 10.6* 12.9* 10.1*  HCT 33.0* 38.0* 31.2*  MCV 95.9  --  97.8  PLT 177  --  409*   Basic Metabolic Panel: Recent Labs  Lab 10/09/20 1118 10/09/20 1125 10/10/20 0147   NA 137 137 136  K 4.6 4.5 5.3*  CL 95* 95* 98  CO2 29  --  25  GLUCOSE 96 92 90  BUN 33* 34* 42*  CREATININE 4.94* 4.70* 5.25*  CALCIUM 8.9  --  8.5*   GFR: Estimated Creatinine Clearance: 12.5 mL/min (A) (by C-G formula based on SCr of 5.25 mg/dL (H)). Liver Function Tests: Recent Labs  Lab 10/09/20 1118 10/10/20 0147  AST 35 44*  ALT 23 24  ALKPHOS 164* 153*  BILITOT 0.9 1.0  PROT 6.6 6.0*  ALBUMIN 2.9* 2.6*   Recent Labs  Lab 10/09/20 1118 10/10/20 1347  LIPASE 48  --   AMYLASE  --  111*   No results for input(s): AMMONIA in  the last 168 hours. Coagulation Profile: No results for input(s): INR, PROTIME in the last 168 hours. Cardiac Enzymes: No results for input(s): CKTOTAL, CKMB, CKMBINDEX, TROPONINI in the last 168 hours. BNP (last 3 results) No results for input(s): PROBNP in the last 8760 hours. HbA1C: Recent Labs    10/09/20 1118  HGBA1C 6.6*   CBG: Recent Labs  Lab 10/11/20 0851 10/11/20 1152 10/11/20 1622 10/11/20 2009 10/12/20 0739  GLUCAP 78 83 153* 116* 97   Lipid Profile: No results for input(s): CHOL, HDL, LDLCALC, TRIG, CHOLHDL, LDLDIRECT in the last 72 hours. Thyroid Function Tests: No results for input(s): TSH, T4TOTAL, FREET4, T3FREE, THYROIDAB in the last 72 hours. Anemia Panel: No results for input(s): VITAMINB12, FOLATE, FERRITIN, TIBC, IRON, RETICCTPCT in the last 72 hours. Sepsis Labs: Recent Labs  Lab 10/09/20 1739 10/10/20 0147 10/11/20 0325  PROCALCITON 0.78 1.35 1.87    Recent Results (from the past 240 hour(s))  SARS CORONAVIRUS 2 (TAT 6-24 HRS) Nasopharyngeal Nasopharyngeal Swab     Status: None   Collection Time: 10/09/20  1:45 PM   Specimen: Nasopharyngeal Swab  Result Value Ref Range Status   SARS Coronavirus 2 NEGATIVE NEGATIVE Final    Comment: (NOTE) SARS-CoV-2 target nucleic acids are NOT DETECTED.  The SARS-CoV-2 RNA is generally detectable in upper and lower respiratory specimens during the acute  phase of infection. Negative results do not preclude SARS-CoV-2 infection, do not rule out co-infections with other pathogens, and should not be used as the sole basis for treatment or other patient management decisions. Negative results must be combined with clinical observations, patient history, and epidemiological information. The expected result is Negative.  Fact Sheet for Patients: SugarRoll.be  Fact Sheet for Healthcare Providers: https://www.woods-mathews.com/  This test is not yet approved or cleared by the [place] FDA and  has been authorized for detection and/or diagnosis of SARS-CoV-2 by FDA under an Emergency Use Authorization (EUA). This EUA will remain  in effect (meaning this test can be used) for the duration of the COVID-19 declaration under Se ction 564(b)(1) of the Act, 21 U.S.C. section 360bbb-3(b)(1), unless the authorization is terminated or revoked sooner.  Performed at [facility], [place] [address], [place], [place] [address]   Culture, body fluid w Gram Stain-bottle     Status: None (Preliminary result)   Collection Time: 10/10/20  1:40 PM   Specimen: Fluid  Result Value Ref Range Status   Specimen Description FLUID PERITONEAL ABDOMEN  Final   Special Requests BOTTLES DRAWN AEROBIC AND ANAEROBIC  Final   Culture   Final    NO GROWTH 2 DAYS Performed at [facility], [place] [address], [place], [place] [address]    Report Status PENDING  Incomplete  Gram stain     Status: None   Collection Time: 10/10/20  1:40 PM   Specimen: Fluid  Result Value Ref Range Status   Specimen Description FLUID PERITONEAL ABDOMEN  Final   Special Requests NONE  Final   Gram Stain   Final    WBC PRESENT, PREDOMINANTLY MONONUCLEAR NO ORGANISMS SEEN CYTOSPIN SMEAR Performed at [facility], [place] [address] [place],  [address]    Report Status 10/10/2020 FINAL  Final  Surgical PCR screen     Status:  None   Collection Time: 10/11/20  3:00 PM   Specimen: Nasal Mucosa; Nasal Swab  Result Value Ref Range Status   MRSA, PCR NEGATIVE NEGATIVE Final   Staphylococcus aureus NEGATIVE  NEGATIVE Final    Comment: (NOTE) The Xpert SA Assay (FDA approved for NASAL specimens in patients 38 years of age and older), is one component of a comprehensive surveillance program. It is not intended to diagnose infection nor to guide or monitor treatment. Performed at [facility], [place] [address] [place], [place] [address]   Gram stain     Status: None   Collection Time: 10/11/20  3:48 PM   Specimen: Lung, Left; Pleural Fluid  Result Value Ref Range Status   Specimen Description PLEURAL FLUID  Final   Special Requests LUNG LEFT  Final   Gram Stain   Final    MODERATE WBC PRESENT,BOTH PMN AND MONONUCLEAR NO ORGANISMS SEEN Performed at [facility], [address], [place], [place] [address]    Report Status 10/11/2020 FINAL  Final  Culture, body fluid w Gram Stain-bottle     Status: None (Preliminary result)   Collection Time: 10/11/20  3:48 PM   Specimen: Pleura  Result Value Ref Range Status   Specimen Description PLEURAL FLUID  Final   Special Requests LUNG LEFT  Final   Culture   Final    NO GROWTH < 24 HOURS Performed at [facility], [place] [address], [place], [place] [address]    Report Status PENDING  Incomplete         Radiology Studies: DG Chest 1 View  Result Date: 10/11/2020 CLINICAL DATA:  Post left thoracentesis EXAM: CHEST  1 VIEW COMPARISON:  10/09/2020 FINDINGS: Decreased left pleural effusion. No complication post thoracentesis. No pneumothorax. Minimal residual left effusion Cardiac enlargement without edema. Right lung clear. Central venous catheter tip in the right atrium unchanged. Long segment stent left subclavian vessel. IMPRESSION: No complication post left thoracentesis. Electronically Signed   By: Franchot Gallo M.D.   On: 10/11/2020 16:16   IR  Paracentesis  Result Date: 10/10/2020 INDICATION: Patient with history of atrial fibrillation, end-stage renal disease. Found to have ascites. Request is for therapeutic and diagnostic paracentesis EXAM: ULTRASOUND GUIDED THERAPEUTIC AND DIAGNOSTIC PARACENTESIS MEDICATIONS: Lidocaine 1% 10 mL COMPLICATIONS: None immediate. PROCEDURE: Informed written consent was obtained from the patient after a discussion of the risks, benefits and alternatives to treatment. A timeout was performed prior to the initiation of the procedure. Initial ultrasound scanning demonstrates a moderate amount of ascites within the right lower abdominal quadrant. The right lower abdomen was prepped and draped in the usual sterile fashion. 1% lidocaine was used for local anesthesia. Following this, a 19 gauge, 7-cm, Yueh catheter was introduced. An ultrasound image was saved for documentation purposes. The paracentesis was performed. The catheter was removed and a dressing was applied. The patient tolerated the procedure well without immediate post procedural complication. FINDINGS: A total of approximately 4.2 L of straw-colored fluid was removed. Samples were sent to the laboratory as requested by the clinical team. IMPRESSION: Successful ultrasound-guided therapeutic and diagnostic paracentesis yielding 4.2 liters of peritoneal fluid. Read by: Rushie Nyhan, NP Electronically Signed   By: Jacqulynn Cadet M.D.   On: 10/10/2020 13:41   IR THORACENTESIS ASP PLEURAL SPACE W/IMG GUIDE  Result Date: 10/11/2020 INDICATION: Patient with a history of chronic atrial fibrillation, ESRD on dialysis, and heart failure presents with left pleural effusion. Interventional radiology asked to perform a therapeutic and diagnostic thoracentesis. EXAM: ULTRASOUND GUIDED THORACENTESIS MEDICATIONS: 1% lidocaine 10 mL COMPLICATIONS: None immediate. PROCEDURE: An ultrasound guided thoracentesis was thoroughly discussed with the patient and questions  answered. The benefits,  risks, alternatives and complications were also discussed. The patient understands and wishes to proceed with the procedure. Written consent was obtained. Ultrasound was performed to localize and mark an adequate pocket of fluid in the left chest. The area was then prepped and draped in the normal sterile fashion. 1% Lidocaine was used for local anesthesia. Under ultrasound guidance a 6 Fr Safe-T-Centesis catheter was introduced. Thoracentesis was performed. The catheter was removed and a dressing applied. FINDINGS: A total of approximately 1.2 L of blood-tinged fluid was removed. Samples were sent to the laboratory as requested by the clinical team. IMPRESSION: Successful ultrasound guided left thoracentesis yielding 1.2 L of pleural fluid. Read by: Soyla Dryer, NP Electronically Signed   By: Aletta Edouard M.D.   On: 10/11/2020 16:37        Scheduled Meds:  atorvastatin  80 mg Oral QHS   Chlorhexidine Gluconate Cloth  6 each Topical Q0600   docusate sodium  100 mg Oral Daily   feeding supplement (NEPRO CARB STEADY)  237 mL Oral BID BM   ferrous gluconate  324 mg Oral Q breakfast   gabapentin  100-200 mg Oral BID   insulin aspart  0-6 Units Subcutaneous TID WC   levETIRAcetam  500 mg Oral BID   midodrine  10 mg Oral TID WC   multivitamin  1 tablet Oral Daily   mupirocin ointment  1 application Nasal BID   pantoprazole  40 mg Oral Daily   sodium chloride flush  3 mL Intravenous Q12H   Continuous Infusions:  sodium chloride       LOS: 3 days    Time spent: 32 minutes    Barb Merino, MD [facility] Pager [phone]

## 2020-10-12 NOTE — Progress Notes (Addendum)
Pt was unable to sip through straw and unable to take PO meds. Not following commands. This nurse notified MD.

## 2020-10-12 NOTE — Plan of Care (Signed)

## 2020-10-13 LAB — RENAL FUNCTION PANEL
Albumin: 2.4 g/dL — ABNORMAL LOW (ref 3.5–5.0)
Anion gap: 13 (ref 5–15)
BUN: 59 mg/dL — ABNORMAL HIGH (ref 8–23)
CO2: 25 mmol/L (ref 22–32)
Calcium: 8 mg/dL — ABNORMAL LOW (ref 8.9–10.3)
Chloride: 93 mmol/L — ABNORMAL LOW (ref 98–111)
Creatinine, Ser: 7.19 mg/dL — ABNORMAL HIGH (ref 0.61–1.24)
GFR, Estimated: 8 mL/min — ABNORMAL LOW (ref >60)
Glucose, Bld: 122 mg/dL — ABNORMAL HIGH (ref 70–99)
Phosphorus: 5.5 mg/dL — ABNORMAL HIGH (ref 2.5–4.6)
Potassium: 4.7 mmol/L (ref 3.5–5.1)
Sodium: 131 mmol/L — ABNORMAL LOW (ref 135–145)

## 2020-10-13 LAB — CBC
HCT: 28.9 % — ABNORMAL LOW (ref 39.0–52.0)
Hemoglobin: 9.3 g/dL — ABNORMAL LOW (ref 13.0–17.0)
MCH: 30.9 pg (ref 26.0–34.0)
MCHC: 32.2 g/dL (ref 30.0–36.0)
MCV: 96 fL (ref 80.0–100.0)
Platelets: 171 10*3/uL (ref 150–400)
RBC: 3.01 MIL/uL — ABNORMAL LOW (ref 4.22–5.81)
RDW: 14.7 % (ref 11.5–15.5)
WBC: 6.1 10*3/uL (ref 4.0–10.5)
nRBC: 0 % (ref 0.0–0.2)

## 2020-10-13 LAB — GLUCOSE, CAPILLARY
Glucose-Capillary: 143 mg/dL — ABNORMAL HIGH (ref 70–99)
Glucose-Capillary: 89 mg/dL (ref 70–99)
Glucose-Capillary: 79 mg/dL (ref 70–99)

## 2020-10-13 LAB — SARS CORONAVIRUS 2 (TAT 6-24 HRS): SARS Coronavirus 2: NEGATIVE

## 2020-10-13 MED ORDER — HEPARIN SODIUM (PORCINE) 1000 UNIT/ML IJ SOLN
INTRAMUSCULAR | Status: AC
  Filled 2020-10-13: qty 5

## 2020-10-13 MED ORDER — MIDODRINE HCL 5 MG PO TABS
ORAL_TABLET | ORAL | Status: AC
  Filled 2020-10-13: qty 2

## 2020-10-13 MED ORDER — MIDODRINE HCL 10 MG PO TABS: 10.0000 mg | ORAL_TABLET | Freq: Three times a day (TID) | ORAL | Status: AC

## 2020-10-13 NOTE — Progress Notes (Signed)
[facility] Progress Note  Subjective: seen in dialysis, BP's in the 80's-90's today.   Vitals:   10/13/20 1200 10/13/20 1230 10/13/20 1330 10/13/20 1445  BP: (!) 90/50 (!) 94/46 (!) 87/50 96/65  Pulse: 78 77 67 76  Resp:    16  Temp:    97.8 F (36.6 C)  TempSrc:    Oral  SpO2:    96%  Weight:        Exam: Gen awake, disheveled, nasal O2 Wet cough No jvd or bruits Chest clear on R, dec'd  L base RRR no MRG Abd soft w/ 1+ ascites GU normal male MS no joint effusions or deformity Ext no LE or other edema, L BKA Neuro nonfocal, gen'd weakness, deconditioning  LUA AVF +bruit,  R IJ TDC        Home meds include asa, lipitor, plavix, pepcid, neurontin 100-200 bid, keppra 500 bid, toprol xl 25 qd, midodrine 5 mg tid, sl ntg prn, brilinta, nepro bid, prn's , vitamins    CT abd - IMPRESSION: 1. Unchanged cirrhosis with increased large volume ascites. 2. Progressive osseous metastatic disease. 3. Unchanged large left and small right pleural effusions. Similar diffuse anasarca.   CXR - increased left pleural effusion and adjacent airspace disease. This could represent atelectasis or developing infection/aspiration    OP HD: [facility] - records pending      LUA AVF and RIJ TDC   Assessment/ Plan: Acute resp failure/ hypoxia - large L effusion and ascites were main issue most likely. CXR w/ layering effusion on L. With low BP's couldn't pull any significant fluid off here.  Hypotension - after LVP on 8/22. Have ^'d midodrine 10 tid. Better. ESRD - on HD in [facility]. Not CRRT candidate given advanced comorbidities. Has MSOF w/ heart/ liver failure and metastatic cancer. Family is not willing to consider limits to care.  Recommend dialysis today, no fluid off due to low bp's. OK to dc from renal standpoint after HD.   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 ok, get phos, get meds H/o CVA HTN - would not give any BP lowering meds (on metoprolol xl at home it appears) Seizure d/o - on keppra       Rob Dalasia Predmore 10/13/2020, 5:05 PM   Recent Labs  Lab 10/10/20 0147 10/13/20 1000  K 5.3* 4.7  BUN 42* 59*  CREATININE 5.25* 7.19*  CALCIUM 8.5* 8.0*  PHOS  --  5.5*  HGB 10.1* 9.3*    Inpatient medications:  atorvastatin  80 mg Oral QHS   Chlorhexidine Gluconate Cloth  6 each Topical Q0600   dextrose  25 g Intravenous Once   docusate sodium  100 mg Oral Daily   feeding supplement (NEPRO CARB STEADY)  237 mL Oral BID BM   ferrous gluconate  324 mg Oral Q breakfast   gabapentin  100 mg Oral Daily   And   gabapentin  200 mg Oral QHS   heparin sodium (porcine)       insulin aspart  0-6 Units Subcutaneous TID WC   levETIRAcetam  500 mg Oral BID   midodrine  10 mg Oral TID WC   multivitamin  1 tablet Oral Daily   mupirocin ointment  1 application Nasal BID   pantoprazole  40 mg Oral  Daily   sodium chloride flush  3 mL Intravenous Q12H    sodium chloride     sodium chloride, acetaminophen **OR** acetaminophen, lidocaine (PF), lidocaine, metoprolol succinate, nitroGLYCERIN, ondansetron, sodium chloride flush

## 2020-10-13 NOTE — Discharge Summary (Signed)
Physician Discharge Summary  Jonnathan Birman IRJ:[id number] DOB: 1950-08-30 DOA: 10/09/2020  PCP: Caprice Renshaw, MD  Admit date: 10/09/2020 Discharge date: 10/13/2020  Admitted From: Long-term nursing home Disposition: Long-term care nursing home  Recommendations for Outpatient Follow-up:  Continue dialysis as scheduled. Recommend palliative care consultation at nursing home.  Home Health: Not applicable Equipment/Devices: Not applicable  Discharge Condition: Fair CODE STATUS: Full code Diet recommendation: Regular diet.  Nutritional supplements.  Discharge Summary: 70 year old gentleman with history of chronic atrial fibrillation, hypertension, hyperlipidemia, ESRD on hemodialysis Monday Wednesday Friday, type 2 diabetes, chronic combined heart failure, dementia, epilepsy brought from nursing home with vomiting and shortness of breath.  Also noted coffee-ground emesis at nursing home.  In the emergency room, hemodynamically stable.  100% on 2 L nasal cannula.  CT scan abdomen pelvis with unchanged cirrhosis, large volume ascites, progressive osseous metastatic disease, unchanged large left and small right pleural effusions.  Diffuse anasarca.  Given Protonix, Rocephin and azithromycin in the emergency room and we were asked to admit.     Assessment & Plan of care:    Acute respiratory failure with hypoxemia: Likely due to enlarging ascites and pleural effusion.  Currently on room air.  Left-sided thoracentesis 1.2 L transudate, cultures negative. Paracentesis, 4 L transudate cultures pending.  Cultures negative. Improved and now on room air.   Cirrhosis of liver with ascites: In the setting of cirrhosis and metastatic prostate cancer as well as ESRD.  Patient also with severely reduced systolic and diastolic function.  End-stage. Compensated.   Suspected pneumonia: Ruled out.  No antibiotics indicated.   Coffee-ground vomiting: Normal vomiting in the hospital.  Hemoglobin is stable.   Known history of cirrhosis and worsening ascites.  Expected portal hypertension and esophageal varices.  Can go back on aspirin Plavix. Hemoglobin remains  stable.  Since symptoms improved, no benefits of upper GI endoscopy.   Chronic atrial fibrillation: Rate controlled.  On metoprolol.  Not on anticoagulation.  He is on aspirin and Plavix.   Chronic combined heart failure: Ejection fraction 20%.  Volume control with dialysis.  Hopefully can tolerate dialysis today and discharged back to nursing home.  Remains on midodrine for support.   ESRD on hemodialysis: Getting hemodialysis today.   Type 2 diabetes with ESRD: Not on treatment.  Encourage nutrition.  Dementia: Symptomatic treatment.  On Keppra for seizure.  ESRD/extreme debility/cardiomyopathy/dementia and failure to thrive: Multiple goals of care discussions held in the hospital with palliative care, nephrology with patient's family.  Recommended comfort care they did not agree.  Also recommended DNR and they did not agree.  Hopefully, he can continue to receive hemodialysis.  Will recommend palliative care consultation at nursing home. Patient is stable enough to transfer back to nursing home today.   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)   Prostate cancer ([place])   Type 2 diabetes mellitus with chronic kidney disease on chronic dialysis ([place])   Cirrhosis of liver with ascites (HCC)   Pleural effusion, left    Discharge Instructions  Discharge Instructions     Diet general   Complete by: As  directed    Discharge wound care:    Complete by: As directed    Padding pressure points   Increase activity slowly   Complete by: As directed       Allergies as of 10/13/2020   No Known Allergies      Medication List     STOP taking these medications    levofloxacin 500 MG tablet Commonly known as: LEVAQUIN   ticagrelor 90 MG Tabs tablet Commonly known as: BRILINTA       TAKE these medications    acetaminophen 325 MG tablet Commonly known as: TYLENOL Take 650 mg by mouth in the morning, at noon, and at bedtime.   aspirin 81 MG tablet Chew 81 mg by mouth daily.   atorvastatin 80 MG tablet Commonly known as: LIPITOR Take 1 tablet (80 mg total) by mouth daily at 6 PM. What changed: when to take this   clopidogrel 75 MG tablet Commonly known as: PLAVIX Take 75 mg by mouth daily.   docusate sodium 100 MG capsule Commonly known as: COLACE Take 100 mg by mouth daily.   ethyl chloride spray Apply 1 application topically every Monday, Wednesday, and Friday. Given at diaylasis   famotidine 20 MG tablet Commonly known as: PEPCID Take 20 mg by mouth daily.   feeding supplement (NEPRO CARB STEADY) Liqd Take 237 mLs by mouth 2 (two) times daily between meals.   ferrous gluconate 324 MG tablet Commonly known as: FERGON Take 324 mg by mouth daily with breakfast.   gabapentin 100 MG capsule Commonly known as: NEURONTIN Take 100-200 mg by mouth 2 (two) times daily. Take 100mg  in the morning and 200mg  in the evening.   levETIRAcetam 500 MG tablet Commonly known as: KEPPRA Take 1 tablet (500 mg total) by mouth 2 (two) times daily.   lidocaine 4 % cream Commonly known as: LMX Apply 1 application topically daily.   metoprolol succinate 25 MG 24 hr tablet Commonly known as: TOPROL-XL Take 1 tablet (25 mg total) by mouth daily. What changed:  how much to take when to take this reasons to take this   midodrine 10 MG tablet Commonly known as: PROAMATINE Take 1 tablet (10 mg total) by mouth 3  (three) times daily with meals. Hold for sbp greater than 809 or diastolic greater than 90 What changed:  medication strength how much to take   Nephro Vitamins 0.8 MG Tabs Take 1 tablet by mouth daily.   nitroGLYCERIN 0.4 MG SL tablet Commonly known as: NITROSTAT Place 0.4 mg under the tongue every 5 (five) minutes x 3 doses as needed for chest pain.               Discharge Care Instructions  (From admission, onward)           Start     Ordered   10/13/20 0000  Discharge wound care:       Comments: Padding pressure points   10/13/20 1149            No Known Allergies  Consultations: Nephrology Palliative   Procedures/Studies: CT Abdomen Pelvis Wo Contrast  Result Date: 10/09/2020 CLINICAL DATA:  Chronic abdominal pain. EXAM: CT ABDOMEN AND PELVIS WITHOUT CONTRAST TECHNIQUE: Multidetector CT imaging of the abdomen and pelvis was performed following the standard protocol without IV contrast. COMPARISON:  CT abdomen pelvis dated March 21, 2020. FINDINGS: Lower chest: Unchanged large left pleural effusion with partial collapse of the left lower lobe. Unchanged small right pleural effusion  with pleural thickening and round atelectasis in the right lower lobe. Similar cardiomegaly. Decreased trace pleural effusion. Hepatobiliary: Unchanged small liver with irregular, nodular contour. No focal liver abnormality. Suspected tiny gallstones, similar to prior study. No biliary dilatation. Pancreas: Unremarkable. No pancreatic ductal dilatation or surrounding inflammatory changes. Spleen: Normal in size without focal abnormality. Adrenals/Urinary Tract: Adrenal glands are unremarkable. Unchanged bilateral renal atrophy with multiple punctate right renal calculi. No hydronephrosis. Mild circumferential bladder wall thickening, improved since the prior study. Stomach/Bowel: Unchanged small hiatal hernia. Stomach is otherwise within normal limits. Appendix appears normal. No  evidence of bowel wall thickening, distention, or inflammatory changes. Colonic diverticulosis. Vascular/Lymphatic: Aortic atherosclerosis. No enlarged abdominal or pelvic lymph nodes. Reproductive: Unchanged mild prostatomegaly. Other: Large volume ascites, increased since the prior study. Similar diffuse anasarca. No pneumoperitoneum. Musculoskeletal: Multiple sclerotic lesions in the spine, pelvis, and proximal left femur demonstrate overall progression since the prior study. IMPRESSION: 1. Unchanged cirrhosis with increased large volume ascites. 2. Progressive osseous metastatic disease. 3. Unchanged large left and small right pleural effusions. Similar diffuse anasarca. 4. Unchanged punctate nonobstructive right nephrolithiasis. 5. Aortic Atherosclerosis (ICD10-I70.0). Electronically Signed   By: Titus Dubin M.D.   On: 10/09/2020 11:30   DG Chest 1 View  Result Date: 10/11/2020 CLINICAL DATA:  Post left thoracentesis EXAM: CHEST  1 VIEW COMPARISON:  10/09/2020 FINDINGS: Decreased left pleural effusion. No complication post thoracentesis. No pneumothorax. Minimal residual left effusion Cardiac enlargement without edema. Right lung clear. Central venous catheter tip in the right atrium unchanged. Long segment stent left subclavian vessel. IMPRESSION: No complication post left thoracentesis. Electronically Signed   By: Franchot Gallo M.D.   On: 10/11/2020 16:16   DG Chest Port 1 View  Result Date: 10/09/2020 CLINICAL DATA:  Cough.  Possible aspiration. EXAM: PORTABLE CHEST 1 VIEW COMPARISON:  09/27/2019 FINDINGS: Dialysis catheter tip at high right atrium. Left subclavian vascular stent, new in the interval. Midline trachea. Moderate cardiomegaly. Moderate left pleural effusion, increased. Possible mild loculation laterally. No pneumothorax. Moderate interstitial edema, similar. Increase in left base airspace disease. IMPRESSION: Slightly worsening aeration with persistent congestive heart failure,  increased left pleural effusion and adjacent airspace disease. This could represent atelectasis or developing infection/aspiration Electronically Signed   By: Abigail Miyamoto M.D.   On: 10/09/2020 11:29   IR Paracentesis  Result Date: 10/10/2020 INDICATION: Patient with history of atrial fibrillation, end-stage renal disease. Found to have ascites. Request is for therapeutic and diagnostic paracentesis EXAM: ULTRASOUND GUIDED THERAPEUTIC AND DIAGNOSTIC PARACENTESIS MEDICATIONS: Lidocaine 1% 10 mL COMPLICATIONS: None immediate. PROCEDURE: Informed written consent was obtained from the patient after a discussion of the risks, benefits and alternatives to treatment. A timeout was performed prior to the initiation of the procedure. Initial ultrasound scanning demonstrates a moderate amount of ascites within the right lower abdominal quadrant. The right lower abdomen was prepped and draped in the usual sterile fashion. 1% lidocaine was used for local anesthesia. Following this, a 19 gauge, 7-cm, Yueh catheter was introduced. An ultrasound image was saved for documentation purposes. The paracentesis was performed. The catheter was removed and a dressing was applied. The patient tolerated the procedure well without immediate post procedural complication. FINDINGS: A total of approximately 4.2 L of straw-colored fluid was removed. Samples were sent to the laboratory as requested by the clinical team. IMPRESSION: Successful ultrasound-guided therapeutic and diagnostic paracentesis yielding 4.2 liters of peritoneal fluid. Read by: Rushie Nyhan, NP Electronically Signed   By: Dellis Filbert.D.  On: 10/10/2020 13:41   IR THORACENTESIS ASP PLEURAL SPACE W/IMG GUIDE  Result Date: 10/11/2020 INDICATION: Patient with a history of chronic atrial fibrillation, ESRD on dialysis, and heart failure presents with left pleural effusion. Interventional radiology asked to perform a therapeutic and diagnostic thoracentesis.  EXAM: ULTRASOUND GUIDED THORACENTESIS MEDICATIONS: 1% lidocaine 10 mL COMPLICATIONS: None immediate. PROCEDURE: An ultrasound guided thoracentesis was thoroughly discussed with the patient and questions answered. The benefits, risks, alternatives and complications were also discussed. The patient understands and wishes to proceed with the procedure. Written consent was obtained. Ultrasound was performed to localize and mark an adequate pocket of fluid in the left chest. The area was then prepped and draped in the normal sterile fashion. 1% Lidocaine was used for local anesthesia. Under ultrasound guidance a 6 Fr Safe-T-Centesis catheter was introduced. Thoracentesis was performed. The catheter was removed and a dressing applied. FINDINGS: A total of approximately 1.2 L of blood-tinged fluid was removed. Samples were sent to the laboratory as requested by the clinical team. IMPRESSION: Successful ultrasound guided left thoracentesis yielding 1.2 L of pleural fluid. Read by: Soyla Dryer, NP Electronically Signed   By: Aletta Edouard M.D.   On: 10/11/2020 16:37   (Echo, Carotid, EGD, Colonoscopy, ERCP)    Subjective: Patient seen and examined in the morning rounds.  No overnight events.  Today he is more alert.  Blood pressures more than 85 systolic.  Patient himself denies any complaints.  Poor historian.   Discharge Exam: Vitals:   10/13/20 1100 10/13/20 1130  BP: (!) 77/44 (!) 86/51  Pulse: 81 86  Resp:    Temp:    SpO2:     Vitals:   10/13/20 1030 10/13/20 1038 10/13/20 1100 10/13/20 1130  BP: (!) 114/55 (!) 96/52 (!) 77/44 (!) 86/51  Pulse: (!) 57 69 81 86  Resp: 14 14    Temp: 97.9 F (36.6 C)     TempSrc: Oral     SpO2: 96%     Weight: 69.2 kg      General: Chronically sick looking.  Debilitated gentleman on room air today.   Cardiovascular: S1-S2 normal.  Ejection murmur present.  No other added sounds. Respiratory: Poor air entry at the left side.  Good air entry on the right  side. Plaintive conducted airway sounds. Gastrointestinal: Distended.  Tympanic. Bowel sounds present. Ext:  AV fistula left upper extremity. Permacath present right IJ. BKA left leg with clean stump. Neuro: Generalized weakness.  Moves all extremities.    The results of significant diagnostics from this hospitalization (including imaging, microbiology, ancillary and laboratory) are listed below for reference.     Microbiology: Recent Results (from the past 240 hour(s))  SARS CORONAVIRUS 2 (TAT 6-24 HRS) Nasopharyngeal Nasopharyngeal Swab     Status: None   Collection Time: 10/09/20  1:45 PM   Specimen: Nasopharyngeal Swab  Result Value Ref Range Status   SARS Coronavirus 2 NEGATIVE NEGATIVE Final    Comment: (NOTE) SARS-CoV-2 target nucleic acids are NOT DETECTED.  The SARS-CoV-2 RNA is generally detectable in upper and lower respiratory specimens during the acute phase of infection. Negative results do not preclude SARS-CoV-2 infection, do not rule out co-infections with other pathogens, and should not be used as the sole basis for treatment or other patient management decisions. Negative results must be combined with clinical observations, patient history, and epidemiological information. The expected result is Negative.  Fact Sheet for Patients: SugarRoll.be  Fact Sheet for Healthcare Providers: https://www.woods-mathews.com/  This  test is not yet approved or cleared by the [place] and  has been authorized for detection and/or diagnosis of SARS-CoV-2 by FDA under an Emergency Use Authorization (EUA). This EUA will remain  in effect (meaning this test can be used) for the duration of the COVID-19 declaration under Se ction 564(b)(1) of the Act, 21 U.S.C. section 360bbb-3(b)(1), unless the authorization is terminated or revoked sooner.  Performed at [facility], [place] [address], [place], [place] [address]    Culture, body fluid w Gram Stain-bottle     Status: None (Preliminary result)   Collection Time: 10/10/20  1:40 PM   Specimen: Fluid  Result Value Ref Range Status   Specimen Description FLUID PERITONEAL ABDOMEN  Final   Special Requests BOTTLES DRAWN AEROBIC AND ANAEROBIC  Final   Culture   Final    NO GROWTH 3 DAYS Performed at [facility], [place] [address] [place], [place] [address]    Report Status PENDING  Incomplete  Gram stain     Status: None   Collection Time: 10/10/20  1:40 PM   Specimen: Fluid  Result Value Ref Range Status   Specimen Description FLUID PERITONEAL ABDOMEN  Final   Special Requests NONE  Final   Gram Stain   Final    WBC PRESENT, PREDOMINANTLY MONONUCLEAR NO ORGANISMS SEEN CYTOSPIN SMEAR Performed at [facility], [place] [address] [place], [place] [address]    Report Status 10/10/2020 FINAL  Final  Surgical PCR screen     Status: None   Collection Time: 10/11/20  3:00 PM   Specimen: Nasal Mucosa; Nasal Swab  Result Value Ref Range Status   MRSA, PCR NEGATIVE NEGATIVE Final   Staphylococcus aureus NEGATIVE NEGATIVE Final    Comment: (NOTE) The Xpert SA Assay (FDA approved for NASAL specimens in patients 10 years of age and older), is one component of a comprehensive surveillance program. It is not intended to diagnose infection nor to guide or monitor treatment. Performed at [facility], [place] [address], [place], [place] [address]   Gram stain     Status: None   Collection Time: 10/11/20  3:48 PM   Specimen: Lung, Left; Pleural Fluid  Result Value Ref Range Status   Specimen Description PLEURAL FLUID  Final   Special Requests LUNG LEFT  Final   Gram Stain   Final    MODERATE WBC PRESENT,BOTH PMN AND MONONUCLEAR NO ORGANISMS SEEN Performed at [facility], [address] [place], [place] [address]    Report Status 10/11/2020 FINAL  Final  Culture, body fluid w Gram Stain-bottle     Status: None (Preliminary result)    Collection Time: 10/11/20  3:48 PM   Specimen: Pleura  Result Value Ref Range Status   Specimen Description PLEURAL FLUID  Final   Special Requests LUNG LEFT  Final   Culture   Final    NO GROWTH 2 DAYS Performed at [place] [address] [place], [place] [address]    Report Status PENDING  Incomplete     Labs: BNP (last 3 results) No results for input(s): BNP in the last 8760 hours. Basic Metabolic Panel: Recent Labs  Lab 10/09/20 1118 10/09/20 1125 10/10/20 0147 10/13/20 1000  NA 137 137 136 131*  K 4.6 4.5 5.3* 4.7  CL 95* 95* 98 93*  CO2 29  --  25 25  GLUCOSE 96 92 90 122*  BUN 33* 34*  42* 59*  CREATININE 4.94* 4.70* 5.25* 7.19*  CALCIUM 8.9  --  8.5* 8.0*  PHOS  --   --   --  5.5*   Liver Function Tests: Recent Labs  Lab 10/09/20 1118 10/10/20 0147 10/13/20 1000  AST 35 44*  --   ALT 23 24  --   ALKPHOS 164* 153*  --   BILITOT 0.9 1.0  --   PROT 6.6 6.0*  --   ALBUMIN 2.9* 2.6* 2.4*   Recent Labs  Lab 10/09/20 1118 10/10/20 1347  LIPASE 48  --   AMYLASE  --  111*   No results for input(s): AMMONIA in the last 168 hours. CBC: Recent Labs  Lab 10/09/20 1118 10/09/20 1125 10/10/20 0147 10/13/20 1000  WBC 8.1  --  7.0 6.1  NEUTROABS 6.2  --   --   --   HGB 10.6* 12.9* 10.1* 9.3*  HCT 33.0* 38.0* 31.2* 28.9*  MCV 95.9  --  97.8 96.0  PLT 177  --  149* 171   Cardiac Enzymes: No results for input(s): CKTOTAL, CKMB, CKMBINDEX, TROPONINI in the last 168 hours. BNP: Invalid input(s): POCBNP CBG: Recent Labs  Lab 10/12/20 1642 10/12/20 1735 10/12/20 1810 10/12/20 2137 10/13/20 0715  GLUCAP 57* 63* 77 126* 143*   D-Dimer No results for input(s): DDIMER in the last 72 hours. Hgb A1c No results for input(s): HGBA1C in the last 72 hours. Lipid Profile No results for input(s): CHOL, HDL, LDLCALC, TRIG, CHOLHDL, LDLDIRECT in the last 72 hours. Thyroid function studies No results for input(s): TSH, T4TOTAL, T3FREE, THYROIDAB in  the last 72 hours.  Invalid input(s): FREET3 Anemia work up No results for input(s): VITAMINB12, FOLATE, FERRITIN, TIBC, IRON, RETICCTPCT in the last 72 hours. Urinalysis    Component Value Date/Time   COLORURINE AMBER (A) 07/13/2019 1522   APPEARANCEUR CLOUDY (A) 07/13/2019 1522   LABSPEC 1.013 07/13/2019 1522   PHURINE 5.0 07/13/2019 1522   GLUCOSEU NEGATIVE 07/13/2019 1522   HGBUR LARGE (A) 07/13/2019 1522   BILIRUBINUR NEGATIVE 07/13/2019 1522   KETONESUR NEGATIVE 07/13/2019 1522   PROTEINUR 100 (A) 07/13/2019 1522   NITRITE NEGATIVE 07/13/2019 1522   LEUKOCYTESUR LARGE (A) 07/13/2019 1522   Sepsis Labs Invalid input(s): PROCALCITONIN,  WBC,  LACTICIDVEN Microbiology Recent Results (from the past 240 hour(s))  SARS CORONAVIRUS 2 (TAT 6-24 HRS) Nasopharyngeal Nasopharyngeal Swab     Status: None   Collection Time: 10/09/20  1:45 PM   Specimen: Nasopharyngeal Swab  Result Value Ref Range Status   SARS Coronavirus 2 NEGATIVE NEGATIVE Final    Comment: (NOTE) SARS-CoV-2 target nucleic acids are NOT DETECTED.  The SARS-CoV-2 RNA is generally detectable in upper and lower respiratory specimens during the acute phase of infection. Negative results do not preclude SARS-CoV-2 infection, do not rule out co-infections with other pathogens, and should not be used as the sole basis for treatment or other patient management decisions. Negative results must be combined with clinical observations, patient history, and epidemiological information. The expected result is Negative.  Fact Sheet for Patients: SugarRoll.be  Fact Sheet for Healthcare Providers: https://www.woods-mathews.com/  This test is not yet approved or cleared by the [place] FDA and  has been authorized for detection and/or diagnosis of SARS-CoV-2 by FDA under an Emergency Use Authorization (EUA). This EUA will remain  in effect (meaning this test can be used) for the  duration of the COVID-19 declaration under Se ction 564(b)(1) of the Act, 21 U.S.C. section 360bbb-3(b)(1),  unless the authorization is terminated or revoked sooner.  Performed at [facility], [place] [address] [place], [place] [address]   Culture, body fluid w Gram Stain-bottle     Status: None (Preliminary result)   Collection Time: 10/10/20  1:40 PM   Specimen: Fluid  Result Value Ref Range Status   Specimen Description FLUID PERITONEAL ABDOMEN  Final   Special Requests BOTTLES DRAWN AEROBIC AND ANAEROBIC  Final   Culture   Final    NO GROWTH 3 DAYS Performed at [facility], [place] [address], [place], [place] [address]    Report Status PENDING  Incomplete  Gram stain     Status: None   Collection Time: 10/10/20  1:40 PM   Specimen: Fluid  Result Value Ref Range Status   Specimen Description FLUID PERITONEAL ABDOMEN  Final   Special Requests NONE  Final   Gram Stain   Final    WBC PRESENT, PREDOMINANTLY MONONUCLEAR NO ORGANISMS SEEN CYTOSPIN SMEAR Performed at [facility], [place] [address], [place], [place] [address]    Report Status 10/10/2020 FINAL  Final  Surgical PCR screen     Status: None   Collection Time: 10/11/20  3:00 PM   Specimen: Nasal Mucosa; Nasal Swab  Result Value Ref Range Status   MRSA, PCR NEGATIVE NEGATIVE Final   Staphylococcus aureus NEGATIVE NEGATIVE Final    Comment: (NOTE) The Xpert SA Assay (FDA approved for NASAL specimens in patients 67 years of age and older), is one component of a comprehensive surveillance program. It is not intended to diagnose infection nor to guide or monitor treatment. Performed at [facility], [place] [address] [place], [place] [address]   Gram stain     Status: None   Collection Time: 10/11/20  3:48 PM   Specimen: Lung, Left; Pleural Fluid  Result Value Ref Range Status   Specimen Description PLEURAL FLUID  Final   Special Requests LUNG LEFT  Final   Gram Stain   Final    MODERATE WBC  PRESENT,BOTH PMN AND MONONUCLEAR NO ORGANISMS SEEN Performed at [facility], [address], [place], [place] [address]    Report Status 10/11/2020 FINAL  Final  Culture, body fluid w Gram Stain-bottle     Status: None (Preliminary result)   Collection Time: 10/11/20  3:48 PM   Specimen: Pleura  Result Value Ref Range Status   Specimen Description PLEURAL FLUID  Final   Special Requests LUNG LEFT  Final   Culture   Final    NO GROWTH 2 DAYS Performed at [facility] [address], [place], [place] [address]    Report Status PENDING  Incomplete     Time coordinating discharge:  40 minutes  SIGNED:   Barb Merino, MD  [facility] 10/13/2020, 11:49 AM

## 2020-10-13 NOTE — TOC Transition Note (Signed)
Transition of Care [facility]) - CM/SW Discharge Note   Patient Details  Name: George Burgess MRN: [id number] Date of Birth: 05-10-1950  [facility]) CM/SW Contact:  Joanne Chars, LCSW Phone Number: 10/13/2020, 2:58 PM   Clinical Narrative:  Pt discharging to [facility], Room 901A.  RN call report to [phone].  Please confirm Covid test is back and negative.       Final next level of care: Skilled Nursing Facility Barriers to Discharge: Barriers Resolved   Patient Goals and CMS Choice        Discharge Placement              Patient chooses bed at:  [facility]) Patient to be transferred to facility by: [place] Name of family member notified: brotehr Wilbur-left message Patient and family notified of of transfer: 10/13/20  Discharge Plan and Services                                     Social Determinants of Health (SDOH) Interventions     Readmission Risk Interventions No flowsheet data found.

## 2020-10-13 NOTE — Progress Notes (Signed)
Palliative-   HPI- 70 y.o. male  with past medical history of metastatic prostate cancer, cirrhosis of liver, chronic atrial fibrillation, hypertension, hyperlipidemia, ESRD on hemodialysis Monday Wednesday Friday, type 2 diabetes, chronic combined heart failure, dementia, and epilepsy admitted on 10/09/2020 with shortness of breath. Diagnosed with acute respiratory failure likely d/t enlarging ascites and pleural effusion. L side thoracentesis completed with 1.2 L fluid removal. Patient also with severely reduced systolic and diastolic function. Patient unable to tolerate HD d/t soft BP. PMT consulted to discuss [place].    Patient was seen by outpatient palliative provider in 2021 - goals were stated as full code/full scope.  Discussion:  Noted patient is scheduled for discharge to SNF today.  He tolerated dialysis well today.  Attempted to call patient's brother for followup from yesterday's discussion- no answer, VM full.   Exam: Awake, alert, disoriented, RR even unlabored  Plan:  D/C to SNF as planned Recommend outpatient Palliative to follow  Mariana Kaufman, AGNP-C Palliative Medicine  Please call Palliative Medicine team phone with any questions [phone]. For individual providers please see AMION.  Greater than 50%  of this time was spent counseling and coordinating care related to the above assessment and plan.  Total time: 18 mins

## 2020-10-13 NOTE — Progress Notes (Signed)
PT Cancellation Note  Patient Details Name: George Burgess MRN: [id number] DOB: 03/07/1950   Cancelled Treatment:    Reason Eval/Treat Not Completed: Patient at procedure or test/unavailable. Pt currently leaving for HD.    [place] 10/13/2020, 10:20 AM [place] Pager [phone] Office [phone]

## 2020-10-13 NOTE — Progress Notes (Signed)
All discharge paperwork placed in discharge packet at nurses station.

## 2020-10-13 NOTE — TOC Progression Note (Signed)
Transition of Care [facility]) - Progression Note    Patient Details  Name: George Burgess MRN: [id number] Date of Birth: Jun 19, 1950  Transition of Care [facility]) CM/SW Contact  Joanne Chars, LCSW Phone Number: 10/13/2020, 9:02 AM  Clinical Narrative:  Per MD, possible DC today.  CSW messages [facility] can take today, can also take tomorrow (Sat) if needed.  Informed her that PT evaluation did recommend rehab.      Expected Discharge Plan: Long Term Nursing Home Barriers to Discharge: Continued Medical Work up  Expected Discharge Plan and Services Expected Discharge Plan: [place]       Living arrangements for the past 2 months: [place]                                       Social Determinants of Health (SDOH) Interventions    Readmission Risk Interventions No flowsheet data found.

## 2020-10-15 LAB — CULTURE, BODY FLUID W GRAM STAIN -BOTTLE: Culture: NO GROWTH

## 2020-10-16 LAB — CULTURE, BODY FLUID W GRAM STAIN -BOTTLE: Culture: NO GROWTH

## 2020-11-03 ENCOUNTER — Inpatient Hospital Stay (HOSPITAL_COMMUNITY)
Admission: EM | Admit: 2020-11-03 | Discharge: 2020-11-17 | DRG: 189 | Disposition: A | Discharge status: Hospice | Payer: Medicare Other | Source: Skilled Nursing Facility | Attending: Family Medicine | Admitting: Family Medicine

## 2020-11-03 ENCOUNTER — Emergency Department (HOSPITAL_COMMUNITY): Payer: Medicare Other

## 2020-11-03 ENCOUNTER — Other Ambulatory Visit: Payer: Self-pay

## 2020-11-03 DIAGNOSIS — L89321 Pressure ulcer of left buttock, stage 1: Secondary | ICD-10-CM | POA: Diagnosis present

## 2020-11-03 DIAGNOSIS — I5043 Acute on chronic combined systolic (congestive) and diastolic (congestive) heart failure: Secondary | ICD-10-CM | POA: Diagnosis present

## 2020-11-03 DIAGNOSIS — I5042 Chronic combined systolic (congestive) and diastolic (congestive) heart failure: Secondary | ICD-10-CM | POA: Diagnosis not present

## 2020-11-03 DIAGNOSIS — R188 Other ascites: Secondary | ICD-10-CM | POA: Diagnosis not present

## 2020-11-03 DIAGNOSIS — K7460 Unspecified cirrhosis of liver: Secondary | ICD-10-CM | POA: Diagnosis present

## 2020-11-03 DIAGNOSIS — Z515 Encounter for palliative care: Secondary | ICD-10-CM | POA: Diagnosis not present

## 2020-11-03 DIAGNOSIS — I9589 Other hypotension: Secondary | ICD-10-CM | POA: Diagnosis present

## 2020-11-03 DIAGNOSIS — D631 Anemia in chronic kidney disease: Secondary | ICD-10-CM | POA: Diagnosis present

## 2020-11-03 DIAGNOSIS — E872 Acidosis, unspecified: Secondary | ICD-10-CM | POA: Insufficient documentation

## 2020-11-03 DIAGNOSIS — F0150 Vascular dementia without behavioral disturbance: Secondary | ICD-10-CM

## 2020-11-03 DIAGNOSIS — I959 Hypotension, unspecified: Secondary | ICD-10-CM | POA: Diagnosis not present

## 2020-11-03 DIAGNOSIS — Z89519 Acquired absence of unspecified leg below knee: Secondary | ICD-10-CM

## 2020-11-03 DIAGNOSIS — Z20822 Contact with and (suspected) exposure to covid-19: Secondary | ICD-10-CM | POA: Diagnosis present

## 2020-11-03 DIAGNOSIS — J9611 Chronic respiratory failure with hypoxia: Secondary | ICD-10-CM | POA: Diagnosis not present

## 2020-11-03 DIAGNOSIS — E1122 Type 2 diabetes mellitus with diabetic chronic kidney disease: Secondary | ICD-10-CM

## 2020-11-03 DIAGNOSIS — J9621 Acute and chronic respiratory failure with hypoxia: Secondary | ICD-10-CM | POA: Diagnosis present

## 2020-11-03 DIAGNOSIS — E1151 Type 2 diabetes mellitus with diabetic peripheral angiopathy without gangrene: Secondary | ICD-10-CM | POA: Diagnosis present

## 2020-11-03 DIAGNOSIS — G40909 Epilepsy, unspecified, not intractable, without status epilepticus: Secondary | ICD-10-CM | POA: Diagnosis present

## 2020-11-03 DIAGNOSIS — L8990 Pressure ulcer of unspecified site, unspecified stage: Secondary | ICD-10-CM | POA: Insufficient documentation

## 2020-11-03 DIAGNOSIS — J918 Pleural effusion in other conditions classified elsewhere: Secondary | ICD-10-CM | POA: Diagnosis present

## 2020-11-03 DIAGNOSIS — I2510 Atherosclerotic heart disease of native coronary artery without angina pectoris: Secondary | ICD-10-CM | POA: Diagnosis present

## 2020-11-03 DIAGNOSIS — N186 End stage renal disease: Secondary | ICD-10-CM

## 2020-11-03 DIAGNOSIS — Z89512 Acquired absence of left leg below knee: Secondary | ICD-10-CM

## 2020-11-03 DIAGNOSIS — E875 Hyperkalemia: Secondary | ICD-10-CM | POA: Diagnosis present

## 2020-11-03 DIAGNOSIS — N185 Chronic kidney disease, stage 5: Secondary | ICD-10-CM

## 2020-11-03 DIAGNOSIS — F0390 Unspecified dementia without behavioral disturbance: Secondary | ICD-10-CM | POA: Diagnosis present

## 2020-11-03 DIAGNOSIS — N189 Chronic kidney disease, unspecified: Secondary | ICD-10-CM | POA: Diagnosis present

## 2020-11-03 DIAGNOSIS — J90 Pleural effusion, not elsewhere classified: Secondary | ICD-10-CM | POA: Diagnosis present

## 2020-11-03 DIAGNOSIS — L89152 Pressure ulcer of sacral region, stage 2: Secondary | ICD-10-CM | POA: Diagnosis present

## 2020-11-03 DIAGNOSIS — Z87891 Personal history of nicotine dependence: Secondary | ICD-10-CM

## 2020-11-03 DIAGNOSIS — Z79899 Other long term (current) drug therapy: Secondary | ICD-10-CM

## 2020-11-03 DIAGNOSIS — I132 Hypertensive heart and chronic kidney disease with heart failure and with stage 5 chronic kidney disease, or end stage renal disease: Secondary | ICD-10-CM | POA: Diagnosis present

## 2020-11-03 DIAGNOSIS — J9602 Acute respiratory failure with hypercapnia: Secondary | ICD-10-CM

## 2020-11-03 DIAGNOSIS — R0602 Shortness of breath: Secondary | ICD-10-CM

## 2020-11-03 DIAGNOSIS — Z7189 Other specified counseling: Secondary | ICD-10-CM | POA: Diagnosis not present

## 2020-11-03 DIAGNOSIS — Z66 Do not resuscitate: Secondary | ICD-10-CM | POA: Diagnosis not present

## 2020-11-03 DIAGNOSIS — N2581 Secondary hyperparathyroidism of renal origin: Secondary | ICD-10-CM | POA: Diagnosis present

## 2020-11-03 DIAGNOSIS — K219 Gastro-esophageal reflux disease without esophagitis: Secondary | ICD-10-CM | POA: Diagnosis present

## 2020-11-03 DIAGNOSIS — C7951 Secondary malignant neoplasm of bone: Secondary | ICD-10-CM | POA: Diagnosis present

## 2020-11-03 DIAGNOSIS — Z7901 Long term (current) use of anticoagulants: Secondary | ICD-10-CM

## 2020-11-03 DIAGNOSIS — Z8673 Personal history of transient ischemic attack (TIA), and cerebral infarction without residual deficits: Secondary | ICD-10-CM

## 2020-11-03 DIAGNOSIS — C61 Malignant neoplasm of prostate: Secondary | ICD-10-CM | POA: Diagnosis present

## 2020-11-03 DIAGNOSIS — R569 Unspecified convulsions: Secondary | ICD-10-CM | POA: Diagnosis not present

## 2020-11-03 DIAGNOSIS — I739 Peripheral vascular disease, unspecified: Secondary | ICD-10-CM | POA: Diagnosis present

## 2020-11-03 DIAGNOSIS — Z9889 Other specified postprocedural states: Secondary | ICD-10-CM

## 2020-11-03 DIAGNOSIS — Z992 Dependence on renal dialysis: Secondary | ICD-10-CM | POA: Diagnosis not present

## 2020-11-03 DIAGNOSIS — I482 Chronic atrial fibrillation, unspecified: Secondary | ICD-10-CM | POA: Diagnosis not present

## 2020-11-03 DIAGNOSIS — E785 Hyperlipidemia, unspecified: Secondary | ICD-10-CM | POA: Diagnosis present

## 2020-11-03 DIAGNOSIS — Z7982 Long term (current) use of aspirin: Secondary | ICD-10-CM

## 2020-11-03 LAB — CBC WITH DIFFERENTIAL/PLATELET
Abs Immature Granulocytes: 0.03 10*3/uL (ref 0.00–0.07)
Basophils Absolute: 0.1 10*3/uL (ref 0.0–0.1)
Basophils Relative: 1 %
Eosinophils Absolute: 0.3 10*3/uL (ref 0.0–0.5)
Eosinophils Relative: 4 %
HCT: 31.8 % — ABNORMAL LOW (ref 39.0–52.0)
Hemoglobin: 10.1 g/dL — ABNORMAL LOW (ref 13.0–17.0)
Immature Granulocytes: 0 %
Lymphocytes Relative: 12 %
Lymphs Abs: 0.8 10*3/uL (ref 0.7–4.0)
MCH: 31.2 pg (ref 26.0–34.0)
MCHC: 31.8 g/dL (ref 30.0–36.0)
MCV: 98.1 fL (ref 80.0–100.0)
Monocytes Absolute: 0.7 10*3/uL (ref 0.1–1.0)
Monocytes Relative: 11 %
Neutro Abs: 4.9 10*3/uL (ref 1.7–7.7)
Neutrophils Relative %: 72 %
Platelets: 220 10*3/uL (ref 150–400)
RBC: 3.24 MIL/uL — ABNORMAL LOW (ref 4.22–5.81)
RDW: 15.3 % (ref 11.5–15.5)
WBC: 6.8 10*3/uL (ref 4.0–10.5)
nRBC: 0 % (ref 0.0–0.2)

## 2020-11-03 LAB — CBG MONITORING, ED: Glucose-Capillary: 119 mg/dL — ABNORMAL HIGH (ref 70–99)

## 2020-11-03 LAB — I-STAT CHEM 8, ED
BUN: 56 mg/dL — ABNORMAL HIGH (ref 8–23)
Calcium, Ion: 0.8 mmol/L — CL (ref 1.15–1.40)
Chloride: 99 mmol/L (ref 98–111)
Creatinine, Ser: 5.9 mg/dL — ABNORMAL HIGH (ref 0.61–1.24)
Glucose, Bld: 108 mg/dL — ABNORMAL HIGH (ref 70–99)
HCT: 34 % — ABNORMAL LOW (ref 39.0–52.0)
Hemoglobin: 11.6 g/dL — ABNORMAL LOW (ref 13.0–17.0)
Potassium: 5.7 mmol/L — ABNORMAL HIGH (ref 3.5–5.1)
Sodium: 130 mmol/L — ABNORMAL LOW (ref 135–145)
TCO2: 27 mmol/L (ref 22–32)

## 2020-11-03 LAB — COMPREHENSIVE METABOLIC PANEL
ALT: 44 U/L (ref 0–44)
AST: 43 U/L — ABNORMAL HIGH (ref 15–41)
Albumin: 2.7 g/dL — ABNORMAL LOW (ref 3.5–5.0)
Alkaline Phosphatase: 202 U/L — ABNORMAL HIGH (ref 38–126)
Anion gap: 13 (ref 5–15)
BUN: 41 mg/dL — ABNORMAL HIGH (ref 8–23)
CO2: 27 mmol/L (ref 22–32)
Calcium: 8.2 mg/dL — ABNORMAL LOW (ref 8.9–10.3)
Chloride: 96 mmol/L — ABNORMAL LOW (ref 98–111)
Creatinine, Ser: 5.69 mg/dL — ABNORMAL HIGH (ref 0.61–1.24)
GFR, Estimated: 10 mL/min — ABNORMAL LOW (ref >60)
Glucose, Bld: 106 mg/dL — ABNORMAL HIGH (ref 70–99)
Potassium: 4.8 mmol/L (ref 3.5–5.1)
Sodium: 136 mmol/L (ref 135–145)
Total Bilirubin: 1 mg/dL (ref 0.3–1.2)
Total Protein: 6.2 g/dL — ABNORMAL LOW (ref 6.5–8.1)

## 2020-11-03 LAB — RESP PANEL BY RT-PCR (FLU A&B, COVID) ARPGX2
Influenza A by PCR: NEGATIVE
Influenza B by PCR: NEGATIVE
SARS Coronavirus 2 by RT PCR: NEGATIVE

## 2020-11-03 LAB — LACTIC ACID, PLASMA: Lactic Acid, Venous: 2.2 mmol/L (ref 0.5–1.9)

## 2020-11-03 MED ORDER — HEPARIN SODIUM (PORCINE) 1000 UNIT/ML IJ SOLN
INTRAMUSCULAR | Status: AC
  Filled 2020-11-03: qty 4

## 2020-11-03 MED ORDER — NEPRO/CARBSTEADY PO LIQD
237.0000 mL | Freq: Two times a day (BID) | ORAL | Status: DC
  Administered 2020-11-04 – 2020-11-15 (×13): 237 mL via ORAL
  Filled 2020-11-03 (×2): qty 237

## 2020-11-03 MED ORDER — ALBUMIN HUMAN 25 % IV SOLN
INTRAVENOUS | Status: AC
  Administered 2020-11-03: 25 g
  Filled 2020-11-03: qty 100

## 2020-11-03 MED ORDER — CHLORHEXIDINE GLUCONATE CLOTH 2 % EX PADS
6.0000 | MEDICATED_PAD | Freq: Every day | CUTANEOUS | Status: DC
  Administered 2020-11-04 – 2020-11-07 (×2): 6 via TOPICAL

## 2020-11-03 MED ORDER — LIDOCAINE 4 % EX CREA
1.0000 "application " | TOPICAL_CREAM | Freq: Every day | CUTANEOUS | Status: DC
  Administered 2020-11-16: 1 via TOPICAL
  Filled 2020-11-03: qty 5

## 2020-11-03 MED ORDER — METOPROLOL SUCCINATE ER 25 MG PO TB24: 25.0000 mg | ORAL_TABLET | Freq: Every day | ORAL | Status: DC

## 2020-11-03 MED ORDER — ALBUTEROL SULFATE (2.5 MG/3ML) 0.083% IN NEBU: 2.5000 mg | INHALATION_SOLUTION | Freq: Four times a day (QID) | RESPIRATORY_TRACT | Status: DC | PRN

## 2020-11-03 MED ORDER — GABAPENTIN 100 MG PO CAPS
100.0000 mg | ORAL_CAPSULE | Freq: Every day | ORAL | Status: DC
  Administered 2020-11-04 – 2020-11-17 (×14): 100 mg via ORAL
  Filled 2020-11-03 (×14): qty 1

## 2020-11-03 MED ORDER — HEPARIN SODIUM (PORCINE) 5000 UNIT/ML IJ SOLN
5000.0000 [IU] | Freq: Three times a day (TID) | INTRAMUSCULAR | Status: DC
  Administered 2020-11-03 – 2020-11-05 (×5): 5000 [IU] via SUBCUTANEOUS
  Filled 2020-11-03 (×5): qty 1

## 2020-11-03 MED ORDER — ONDANSETRON HCL 4 MG/2ML IJ SOLN: 4.0000 mg | Freq: Four times a day (QID) | INTRAMUSCULAR | Status: DC | PRN

## 2020-11-03 MED ORDER — ATORVASTATIN CALCIUM 80 MG PO TABS
80.0000 mg | ORAL_TABLET | Freq: Every day | ORAL | Status: DC
  Administered 2020-11-04 – 2020-11-15 (×12): 80 mg via ORAL
  Filled 2020-11-03 (×12): qty 1

## 2020-11-03 MED ORDER — LEVETIRACETAM 500 MG PO TABS
500.0000 mg | ORAL_TABLET | Freq: Two times a day (BID) | ORAL | Status: DC
  Administered 2020-11-04 – 2020-11-17 (×27): 500 mg via ORAL
  Filled 2020-11-03 (×27): qty 1

## 2020-11-03 MED ORDER — DOCUSATE SODIUM 100 MG PO CAPS
100.0000 mg | ORAL_CAPSULE | Freq: Every day | ORAL | Status: DC
  Administered 2020-11-04 – 2020-11-16 (×9): 100 mg via ORAL
  Filled 2020-11-03 (×9): qty 1

## 2020-11-03 MED ORDER — PANTOPRAZOLE SODIUM 20 MG PO TBEC
20.0000 mg | DELAYED_RELEASE_TABLET | Freq: Every day | ORAL | Status: DC
  Administered 2020-11-03 – 2020-11-16 (×14): 20 mg via ORAL
  Filled 2020-11-03 (×14): qty 1

## 2020-11-03 MED ORDER — GABAPENTIN 100 MG PO CAPS: 100.0000 mg | ORAL_CAPSULE | Freq: Two times a day (BID) | ORAL | Status: DC

## 2020-11-03 MED ORDER — ACETAMINOPHEN 325 MG PO TABS
650.0000 mg | ORAL_TABLET | Freq: Four times a day (QID) | ORAL | Status: DC | PRN
  Administered 2020-11-06 – 2020-11-09 (×4): 650 mg via ORAL
  Filled 2020-11-03 (×4): qty 2

## 2020-11-03 MED ORDER — SODIUM ZIRCONIUM CYCLOSILICATE 10 G PO PACK: 10.0000 g | PACK | Freq: Once | ORAL | Status: DC

## 2020-11-03 MED ORDER — ASPIRIN 325 MG PO TABS
325.0000 mg | ORAL_TABLET | Freq: Every day | ORAL | Status: DC
  Administered 2020-11-04 – 2020-11-16 (×13): 325 mg via ORAL
  Filled 2020-11-03 (×13): qty 1

## 2020-11-03 MED ORDER — ACETAMINOPHEN 650 MG RE SUPP: 650.0000 mg | Freq: Four times a day (QID) | RECTAL | Status: DC | PRN

## 2020-11-03 MED ORDER — MIDODRINE HCL 5 MG PO TABS
15.0000 mg | ORAL_TABLET | Freq: Three times a day (TID) | ORAL | Status: DC
  Administered 2020-11-03 – 2020-11-16 (×38): 15 mg via ORAL
  Filled 2020-11-03 (×39): qty 3

## 2020-11-03 MED ORDER — CLOPIDOGREL BISULFATE 75 MG PO TABS
75.0000 mg | ORAL_TABLET | Freq: Every day | ORAL | Status: DC
  Administered 2020-11-03 – 2020-11-16 (×14): 75 mg via ORAL
  Filled 2020-11-03 (×14): qty 1

## 2020-11-03 MED ORDER — GABAPENTIN 100 MG PO CAPS
200.0000 mg | ORAL_CAPSULE | Freq: Every day | ORAL | Status: DC
  Administered 2020-11-04 – 2020-11-16 (×11): 200 mg via ORAL
  Filled 2020-11-03 (×13): qty 2

## 2020-11-03 MED ORDER — ONDANSETRON HCL 4 MG PO TABS: 4.0000 mg | ORAL_TABLET | Freq: Four times a day (QID) | ORAL | Status: DC | PRN

## 2020-11-03 MED ORDER — MIDODRINE HCL 5 MG PO TABS: 10.0000 mg | ORAL_TABLET | Freq: Three times a day (TID) | ORAL | Status: DC

## 2020-11-03 NOTE — ED Notes (Signed)
Nephrologist at bedside, Candiss Norse, MD

## 2020-11-03 NOTE — H&P (Addendum)
History and Physical    Rodd Heft DTO:[id number] DOB: December 23, 1950 DOA: 11/03/2020  Referring MD/NP/PA: Franchot Heidelberg, PA-C PCP: Caprice Renshaw, MD  Consultants: cardiology: Dr. Claiborne Billings, oncology: Dr. Alen Blew, urology: Dr. Venia Minks at [facility], nephrology: Dr. Joelyn Oms, pulm: Dr. Camillo Flaming.  Patient coming from: [place] [facility] via EMS  Chief Complaint: Shortness of breath  I have personally briefly reviewed patient's old medical records in [place]   HPI: George Burgess is a 70 y.o. male with medical history significant of dementia, atrial fibrillation, ESRD on HD, combined heart failure, dementia, hypertension, diabetes mellitus type 2, PVD  s/p left BKA, metastatic prostate cancer, and seizure disorder presents with complaints of shortness of breath.  Patient complains of having a productive cough and being more short of breath.  He notes that his stomach may be getting bigger.  Records note patient is unable to go to dialysis today due to hypotension. He denies any significant fever.  Patient had just been hospitalized from 8/22-8/26 with complaints of shortness of breath after also being noted to also have coffee-ground emesis.  Hemoglobin remained stable and emesis was noted to be within normal limits while in the hospital symptoms thought possibly related to hypertension and esophageal varices, but no endoscopic evaluation was recommended.  Patient was worked up and noted to have enlarging ascites and large left-sided pleural effusion for which he underwent thoracentesis and paracentesis focal which noted transudative fluid.    ED Course: Upon admission into the emergency department patient was seen to be afebrile with blood pressure 84/55-84/56, and O2 saturation maintained on 2 L nasal cannula oxygen.  Labs significant for hemoglobin 10.1 g/dL, potassium 4.8, BUN 41, creatinine 5.69, alkaline phosphatase 202, albumin 2.7, AST 43.  Chest x-ray showed a large left-sided pleural effusion that is  new since 10/11/2020.  Nephrology have been formally consulted.  TRH called to admit.  Review of Systems  Constitutional:  Negative for fever and malaise/fatigue.  Respiratory:  Positive for cough and shortness of breath.   Cardiovascular:  Negative for chest pain.  Gastrointestinal:  Negative for nausea and vomiting.  Otherwise a 10 point review of systems was completed and negative except for as noted above in HPI   Past Medical History:  Diagnosis Date   Atrial fibrillation ([place])    a. Chronic Eliquis (CHA2DS2VASc = 6).   Chronic combined systolic (congestive) and diastolic (congestive) heart failure ([place])    a. Previously reported EF of 30%;  b. 08/2016 Echo: EF 40-45%, antsept, inf, infsept HK, Gr3 DD, mod AI/MR, sev dil LA, mild TR, PASP 58mmHg.   Dementia ([place])    Encephalopathy    End stage renal disease ([place])    a. On HD.   Epilepsy, unspecified, not intractable, without status epilepticus ([place]) 03/21/2018   provided from [place] records   Essential hypertension    GIB (gastrointestinal bleeding)    a. 08/2016 in setting of Eliquis Rx.   Hyperlipidemia    Pleural effusion    PVD (peripheral vascular disease) ([place])    a. s/p L BKA;  b. Chronic LE wounds.   Stroke ([place])    Type II diabetes mellitus ([place])     Past Surgical History:  Procedure Laterality Date   BELOW KNEE LEG AMPUTATION Left    CORONARY/GRAFT ACUTE MI REVASCULARIZATION N/A 06/20/2019   Procedure: Coronary/Graft Acute MI Revascularization;  Surgeon: Troy Sine, MD;  Location: [facility];  Service: Cardiovascular;  Laterality: N/A;   FLEXIBLE SIGMOIDOSCOPY  N/A 09/07/2016   Procedure: FLEXIBLE SIGMOIDOSCOPY;  Surgeon: Manus Gunning, MD;  Location: [facility];  Service: Gastroenterology;  Laterality: N/A;   IR PARACENTESIS  10/10/2020   IR THORACENTESIS ASP PLEURAL SPACE W/IMG GUIDE  10/11/2020   LEFT HEART CATH AND CORONARY ANGIOGRAPHY N/A 06/20/2019   Procedure: LEFT HEART  CATH AND CORONARY ANGIOGRAPHY;  Surgeon: Troy Sine, MD;  Location: [place] [facility];  Service: Cardiovascular;  Laterality: N/A;     reports that he has quit smoking. He has never used smokeless tobacco. He reports that he does not drink alcohol and does not use drugs.  No Known Allergies  Family History  Problem Relation Age of Onset   Other Mother        Pt unsure of PMH of family members.    Prior to Admission medications   Medication Sig Start Date End Date Taking? Authorizing Provider  acetaminophen (TYLENOL) 325 MG tablet Take 650 mg by mouth in the morning, at noon, and at bedtime.   Yes [provider]  aspirin 325 MG tablet Take 325 mg by mouth daily.   Yes [provider]  atorvastatin (LIPITOR) 80 MG tablet Take 1 tablet (80 mg total) by mouth daily at 6 PM. Patient taking differently: Take 80 mg by mouth at bedtime. 06/22/19  Yes Kroeger, Daleen Snook M., PA-C  B Complex-C-Folic Acid (NEPHRO VITAMINS) 0.8 MG TABS Take 1 tablet by mouth daily.   Yes [provider]  docusate sodium (COLACE) 100 MG capsule Take 100 mg by mouth daily.   Yes [provider]  ferrous gluconate (FERGON) 324 MG tablet Take 324 mg by mouth daily with breakfast.   Yes [provider]  gabapentin (NEURONTIN) 100 MG capsule Take 100-200 mg by mouth 2 (two) times daily. Take 100mg  in the morning and 200mg  in the evening.   Yes [provider]  levETIRAcetam (KEPPRA) 500 MG tablet Take 1 tablet (500 mg total) by mouth 2 (two) times daily. 03/21/18  Yes Danford, Suann Larry, MD  lidocaine (LMX) 4 % cream Apply 1 application topically daily.   Yes [provider]  metoprolol succinate (TOPROL-XL) 25 MG 24 hr tablet Take 1 tablet (25 mg total) by mouth daily. 07/18/19 12/23/20 Yes Brimage, Vondra, DO  midodrine (PROAMATINE) 10 MG tablet Take 1 tablet (10 mg total) by mouth 3 (three) times daily with meals. Hold for sbp greater than 546 or diastolic  greater than 90 10/13/20  Yes Ghimire, Dante Gang, MD  nitroGLYCERIN (NITROSTAT) 0.4 MG SL tablet Place 0.4 mg under the tongue every 5 (five) minutes x 3 doses as needed for chest pain.   Yes [provider]  Nutritional Supplements (FEEDING SUPPLEMENT, NEPRO CARB STEADY,) LIQD Take 237 mLs by mouth 2 (two) times daily between meals.    Yes [provider]  omeprazole (PRILOSEC) 10 MG capsule Take 10 mg by mouth daily.   Yes [provider]  OXYGEN Inhale 1-5 L into the lungs as needed (May titrate to keep stats >90%).   Yes [provider]    Physical Exam:  Constitutional: Elderly male currently appears to be in no acute distress Vitals:   11/03/20 1350 11/03/20 1525  BP: (!) 84/56 (!) 84/55  Pulse: (!) 59 61  Resp: 16 16  Temp: (!) 97.5 F (36.4 C)   TempSrc: Oral   SpO2: 100% 97%   Eyes: PERRL, lids and conjunctivae normal ENMT: Mucous membranes are moist. Posterior pharynx clear of any  exudate or lesions.  Neck: normal, supple, no masses, no thyromegaly Respiratory: Decreased breath sounds appreciated in the left lung field.  Rales noted on the left.  Cardiovascular: Irregular, no murmurs / rubs / gallops.  2+ pitting edema of the right lower extremity. 2+ pedal pulses. No carotid bruits.  Abdomen: Protuberant abdomen with positive fluid wave. Musculoskeletal: no clubbing / cyanosis.  Left BKA. Skin: Scabbed over lesions noted of the right lower extremity Neurologic: CN 2-12 grossly intact.  Able to move all extremities.  Patient appears to studder   psychiatric: Normal judgment and insight. Alert and oriented at least to person and place. Normal mood.     Labs on Admission: I have personally reviewed following labs and imaging studies  CBC: Recent Labs  Lab 11/03/20 1424  HGB 11.6*  HCT 81.8*   Basic Metabolic Panel: Recent Labs  Lab 11/03/20 1424  NA 130*  K 5.7*  CL 99  GLUCOSE 108*  BUN 56*  CREATININE 5.90*   GFR: CrCl  cannot be calculated (Unknown ideal weight.). Liver Function Tests: No results for input(s): AST, ALT, ALKPHOS, BILITOT, PROT, ALBUMIN in the last 168 hours. No results for input(s): LIPASE, AMYLASE in the last 168 hours. No results for input(s): AMMONIA in the last 168 hours. Coagulation Profile: No results for input(s): INR, PROTIME in the last 168 hours. Cardiac Enzymes: No results for input(s): CKTOTAL, CKMB, CKMBINDEX, TROPONINI in the last 168 hours. BNP (last 3 results) No results for input(s): PROBNP in the last 8760 hours. HbA1C: No results for input(s): HGBA1C in the last 72 hours. CBG: Recent Labs  Lab 11/03/20 1347  GLUCAP 119*   Lipid Profile: No results for input(s): CHOL, HDL, LDLCALC, TRIG, CHOLHDL, LDLDIRECT in the last 72 hours. Thyroid Function Tests: No results for input(s): TSH, T4TOTAL, FREET4, T3FREE, THYROIDAB in the last 72 hours. Anemia Panel: No results for input(s): VITAMINB12, FOLATE, FERRITIN, TIBC, IRON, RETICCTPCT in the last 72 hours. Urine analysis:    Component Value Date/Time   COLORURINE AMBER (A) 07/13/2019 1522   APPEARANCEUR CLOUDY (A) 07/13/2019 1522   LABSPEC 1.013 07/13/2019 1522   PHURINE 5.0 07/13/2019 1522   GLUCOSEU NEGATIVE 07/13/2019 1522   HGBUR LARGE (A) 07/13/2019 1522   BILIRUBINUR NEGATIVE 07/13/2019 1522   KETONESUR NEGATIVE 07/13/2019 1522   PROTEINUR 100 (A) 07/13/2019 1522   NITRITE NEGATIVE 07/13/2019 1522   LEUKOCYTESUR LARGE (A) 07/13/2019 1522   Sepsis Labs: No results found for this or any previous visit (from the past 240 hour(s)).   Radiological Exams on Admission: DG Chest 2 View  Result Date: 11/03/2020 CLINICAL DATA:  Cough EXAM: CHEST - 2 VIEW COMPARISON:  Chest radiograph 10/11/2020 FINDINGS: There is a right sided dialysis catheter in place, unchanged. There is a new large left pleural effusion obscuring the left heart border and layering up the lateral chest wall. A small portion of the left upper  lobe is aerated. Patchy opacities in the right base likely reflect subsegmental atelectasis. The right lung is otherwise clear. There is no significant right effusion. There is no pneumothorax. A left axillary vascular stent is again seen. There is no acute osseous abnormality. IMPRESSION: Large left pleural effusion is new since 10/11/2020. A small portion of the left upper lobe remains aerated. Electronically Signed   By: Valetta Mole M.D.   On: 11/03/2020 15:04    EKG: Independently reviewed.  Sinus rhythm at 62 bpm with short PR interval.   Assessment/Plan Acute respiratory failure with hypoxia  secondary to recurrent left pleural effusion and cirrhosis of liver with ascites ascites: Patient presents with complaints of productive cough and shortness of breath.  O2 saturation currently maintained on 2 L of nasal cannula oxygen which patient reports was started during last hospitalization.  Chest x-ray noting a large left-sided pleural effusion.  Required thoracentesis and paracentesis during last hospitalization.  Fluid analysis for both were noted to be transudative in nature. -Admit to a progressive bed -Continuous pulse oximetry with nasal cannula oxygen maintain O2 saturation greater than 92% -Will likely need repeat paracentesis and thoracentesis during his hospitalization, but would ensure that patient blood pressure stays stable for HD first  Chronic hypotension: On admission patient's blood pressure 80s/50s with MAP currently maintained at 365.  Home medication regimen includes midodrine 10 mg 3 times daily and metoprolol succinate 25 mg daily.  He had reportedly had his metoprolol for today. -Midodrine increased to 15 mg 3 times daily -Albumin per nephrology -Held metoprolol due to soft blood pressures and need for hemodialysis  Lactic acidosis: Acute.  Patient was noted to be afebrile with white blood cell count within normal limits.  Lactic acid was elevated at 2.2, but suspect related  with patient being fluid overloaded and/or hypoperfusion. -Continue to monitor  ESRD on HD: Patient is supposed to go Monday, Wednesday, and Friday.  He last dialyzed on 9/12.  He did not feel well on 9/14, and was told that his blood pressure was too low today. -Appreciate nephrology consultative services,  will follow-up for further recommendations  Combined systolic and diastolic CHF: Acute on chronic. Patient appears to be fluid overloaded with at least 2+ pitting edema noted on the right lower extremity.  Last EF noted to be around 20-25% with left ventricular global hypokinesis and moderate pulmonary hypertension at [facility] in 06/2020. -Volume control per nephrology with HD  Atrial fibrillation, chronic: Patient currently appears to be rate controlled.  He is not on anticoagulation. -Continue aspirin and Plavix  Anemia of chronic kidney disease: Hemoglobin 10.1 g/dL which appears patient's baseline.   -Continue to monitor  Diabetes mellitus type 2: Currently diet controlled.  Last hemoglobin A1c was 6.6 on 8/22. -Renal/Carb modified diet   Coronary artery disease: Patient denies any complaints of chest pain.  Per his last cath in 06/2019 patient had multivessel coronary artery disease , but underwent successful PCI to the total LAD occlusion seen at that time. -Continue aspirin, statin, and Plavix  Dementia: Patient not on any medications for treatment.  Patiently is alert and oriented to person and place at this time.  Peripheral vascular disease status post left BKA  Seizure disorder: No reports of seizure at activity -Continue Keppra  GERD -Continue pharmacy substitution of Protonix  Hyperlipidemia -Continue atorvastatin  History of metastatic prostate cancer  DVT prophylaxis: heparin  Code Status: Full Family Communication: Attempted to call patient's sister and brother but no answer was received Disposition Plan: Likely discharge back to [facility] once medically  stable Consults called: Nephrology Admission status: Inpatient require more than 2 midnight stay for  Norval Morton MD [facility]   If 7PM-7AM, please contact night-coverage   11/03/2020, 3:35 PM

## 2020-11-03 NOTE — Consult Note (Signed)
ESRD Consult Note  Assessment/Recommendations:  # ESRD:  -outpatient orders: [facility], MWF, EDW 75 kg, left upper extremity AVG (also has TDC), 4 hours, 400/600, 3K, 2.5 calcium, heparin 3800unit bolus (no maintenance) -will attempt to run him on HD today, lowest temp possible w/ midodrine, albumin prn, uf profile. Will check for potential HD needs tomorrow (sequential treatment for pure UF?) -I am worried that he may not tolerate HD or have any fluid pulled off, if that's the case then he would need CRRT but really should engage family for [place] discussions given the fact that everything we're doing especially from previous admissions is just reducing his quality of life and possibly hastening his mortality (dialysis in a cirrhotic patient who also has dementia and metastatic prostate Ca and HFrEF). I would overall not recommend CRRT but this is dependent on what the family wants.   # Volume/ chronic hypotension: EDW 75kg. UF 2L as tolerated, was over EDW when he presented to his HD units today (83kg), may need a DUF treatment tomorrow (let's see how he does tonight) -increasing midodrine to see if there will be any benefit -consider thora and paracentesis especially in light of underlying cirrhosis--defer to primary service  # Anemia of Chronic Kidney Disease: Hemoglobin 10.1. Will obtain records for ESA  #Hyperkalemia -repeat labs K 4.8  # Secondary Hyperparathyroidism/Hyperphosphatemia: monitor phos   # Vascular access: has LUE AVG that is being cannulated, if here over the weekend, then can arrange for [facility] to be removed w/ IR (provided we are successfully cannulating his AVF  # Additional recommendations: - Dose all meds for creatinine clearance < 10 ml/min  - Unless absolutely necessary, no MRIs with gadolinium.  - Implement save arm precautions.  Prefer needle sticks in the dorsum of the hands or wrists.  No blood pressure measurements in arm. - If blood transfusion is requested  during hemodialysis sessions, please alert [place] prior to the session.  - If a hemodialysis catheter line culture is requested, please alert [place] as only hemodialysis nurses are able to collect those specimens.   Gean Quint, MD [facility]  History of Present Illness: George Burgess is a 70 y.o. male with a past medical history of ESRD, DM 2, dementia, cirrhosis, HFrEF, epilepsy, history of stroke, PVD status post left BKA, metastatic prostate cancer who presents with from [facility] for low blood pressure and cough.  Discussed with his dialysis unit.  Last dialysis was on Monday..  Felt poorly therefore did not, on Wednesday.  He did present to his dialysis unit today but his blood pressure was not improving hence he was sent to the ER.  Also has been having abdominal distention. Checks x-ray today showed large left pleural effusion. Recently discharged from [facility]. Similar issue. Not deemed a CRRT candidate given multiple advanced comorbidities. Palliative care was consulted and apparently family wants everything done/full scope of care. Did discuss with his outpatient dialysis unit, they have been able to cannulate his AVG however patient was not going to his appointments to have his TDC removed. He currently offers no complaints but when prompted about abdominal distension and SOB he does report that he is having these issues.  Medications:  Current Facility-Administered Medications  Medication Dose Route Frequency Provider Last Rate Last Admin   [START ON 11/04/2020] Chlorhexidine Gluconate Cloth 2 % PADS 6 each  6 each Topical Q0600 Gean Quint, MD       midodrine (PROAMATINE) tablet 10 mg  10 mg Oral  TID WC Caccavale, Sophia, PA-C       Current Outpatient Medications  Medication Sig Dispense Refill   acetaminophen (TYLENOL) 325 MG tablet Take 650 mg by mouth in the morning, at noon, and at bedtime.     aspirin 325 MG tablet Take 325 mg by mouth daily.     atorvastatin  (LIPITOR) 80 MG tablet Take 1 tablet (80 mg total) by mouth daily at 6 PM. (Patient taking differently: Take 80 mg by mouth at bedtime.) 90 tablet 3   B Complex-C-Folic Acid (NEPHRO VITAMINS) 0.8 MG TABS Take 1 tablet by mouth daily.     docusate sodium (COLACE) 100 MG capsule Take 100 mg by mouth daily.     ferrous gluconate (FERGON) 324 MG tablet Take 324 mg by mouth daily with breakfast.     gabapentin (NEURONTIN) 100 MG capsule Take 100-200 mg by mouth 2 (two) times daily. Take 100mg  in the morning and 200mg  in the evening.     levETIRAcetam (KEPPRA) 500 MG tablet Take 1 tablet (500 mg total) by mouth 2 (two) times daily. 60 tablet 3   lidocaine (LMX) 4 % cream Apply 1 application topically daily.     metoprolol succinate (TOPROL-XL) 25 MG 24 hr tablet Take 1 tablet (25 mg total) by mouth daily. 30 tablet 0   midodrine (PROAMATINE) 10 MG tablet Take 1 tablet (10 mg total) by mouth 3 (three) times daily with meals. Hold for sbp greater than 570 or diastolic greater than 90     nitroGLYCERIN (NITROSTAT) 0.4 MG SL tablet Place 0.4 mg under the tongue every 5 (five) minutes x 3 doses as needed for chest pain.     Nutritional Supplements (FEEDING SUPPLEMENT, NEPRO CARB STEADY,) LIQD Take 237 mLs by mouth 2 (two) times daily between meals.      omeprazole (PRILOSEC) 10 MG capsule Take 10 mg by mouth daily.     OXYGEN Inhale 1-5 L into the lungs as needed (May titrate to keep stats >90%).       ALLERGIES Patient has no known allergies.  MEDICAL HISTORY Past Medical History:  Diagnosis Date   Atrial fibrillation ([place])    a. Chronic Eliquis (CHA2DS2VASc = 6).   Chronic combined systolic (congestive) and diastolic (congestive) heart failure ([place])    a. Previously reported EF of 30%;  b. 08/2016 Echo: EF 40-45%, antsept, inf, infsept HK, Gr3 DD, mod AI/MR, sev dil LA, mild TR, PASP 23mmHg.   Dementia ([place])    Encephalopathy    End stage renal disease ([place])    a. On HD.   Epilepsy, unspecified,  not intractable, without status epilepticus ([place]) 03/21/2018   provided from [place] records   Essential hypertension    GIB (gastrointestinal bleeding)    a. 08/2016 in setting of Eliquis Rx.   Hyperlipidemia    Pleural effusion    PVD (peripheral vascular disease) ([place])    a. s/p L BKA;  b. Chronic LE wounds.   Stroke ([place])    Type II diabetes mellitus ([place])      SOCIAL HISTORY Social History   Socioeconomic History   Marital status: Single    Spouse name: Not on file   Number of children: Not on file   Years of education: Not on file   Highest education level: Not on file  Occupational History   Occupation: retired/disabled  Tobacco Use   Smoking status: Former   Smokeless tobacco: Never   Tobacco comments:  Pt thinks he used to smoke cigarettes.  Thinks he quit many years ago.  Vaping Use   Vaping Use: Never used  Substance and Sexual Activity   Alcohol use: No   Drug use: No   Sexual activity: Never  Other Topics Concern   Not on file  Social History Narrative   Lives @ [facility].  Sedentary.   Social Determinants of Health   Financial Resource Strain: Not on file  Food Insecurity: Not on file  Transportation Needs: Not on file  Physical Activity: Not on file  Stress: Not on file  Social Connections: Not on file  Intimate Partner Violence: Not on file     FAMILY HISTORY Family History  Problem Relation Age of Onset   Other Mother        Pt unsure of PMH of family members.     Review of Systems: 12 systems were reviewed and negative except per HPI  Physical Exam: Vitals:   11/03/20 1350 11/03/20 1525  BP: (!) 84/56 (!) 84/55  Pulse: (!) 59 61  Resp: 16 16  Temp: (!) 97.5 F (36.4 C)   SpO2: 100% 97%   No intake/output data recorded. No intake or output data in the 24 hours ending 11/03/20 1619 General: chronically ill appearing, no acute distress HEENT: anicteric sclera, MMM CV: normal rate, no murmurs Lungs: diminished  breath sounds left lung fields Abd: soft, non-tender, markedly distended MSK: trace edema RLE with wounds, left BKA (prosthesis in place) Skin: wounds RLE Neuro: normal speech, no gross focal deficits  HD access: LUE AVG +b/t, RIJ TDC c/d/i  Test Results Reviewed Lab Results  Component Value Date   NA 130 (L) 11/03/2020   K 5.7 (H) 11/03/2020   CL 99 11/03/2020   CO2 25 10/13/2020   BUN 56 (H) 11/03/2020   CREATININE 5.90 (H) 11/03/2020   CALCIUM 8.0 (L) 10/13/2020   ALBUMIN 2.4 (L) 10/13/2020   PHOS 5.5 (H) 10/13/2020    I have reviewed relevant outside healthcare records

## 2020-11-03 NOTE — ED Provider Notes (Signed)
[place] [facility] EMERGENCY DEPARTMENT Provider Note   CSN: [id number] Arrival date & time: 11/03/20  1343     History Chief Complaint  Patient presents with   Hypotension    George Burgess is a 70 y.o. male presenting for low blood pressure and cough.  Level 5 caveat due to dementia.  Patient states he has had a cough.  He states he feels poorly.  He is unable to tell me much else about what is been going on.  Additional history obtained from [facility].  They state patient goes to dialysis Monday, Wednesday, Friday.  He did not go on Wednesday because he was not feeling well, did not want to go this morning but they convinced him to go.  Patient had emesis throughout the night on Tuesday night, no blood noted.  He has not had any known emesis since.  However he has had a cough.  No fevers.  Per staff, patient has a history of hypotension, normally in the 90s, on midodrine for this.  Staff states patient has been more confused than normal, making states such as "I want to go to the hospital because they think I am on dope." Pt is on 2L O2 at baseline. Pt is on ASA, no other blood thinners.   Additional history obtained from chart review.  History of A. fib, hypertension, chronic anemia, ESRD on HD Monday, Wednesday, Friday, diabetes, CHF, dementia, epilepsy.  HPI     Past Medical History:  Diagnosis Date   Atrial fibrillation ([place])    a. Chronic Eliquis (CHA2DS2VASc = 6).   Chronic combined systolic (congestive) and diastolic (congestive) heart failure ([place])    a. Previously reported EF of 30%;  b. 08/2016 Echo: EF 40-45%, antsept, inf, infsept HK, Gr3 DD, mod AI/MR, sev dil LA, mild TR, PASP 55mmHg.   Dementia ([place])    Encephalopathy    End stage renal disease ([place])    a. On HD.   Epilepsy, unspecified, not intractable, without status epilepticus ([place]) 03/21/2018   provided from [facility] records   Essential hypertension    GIB  (gastrointestinal bleeding)    a. 08/2016 in setting of Eliquis Rx.   Hyperlipidemia    Pleural effusion    PVD (peripheral vascular disease) ([place])    a. s/p L BKA;  b. Chronic LE wounds.   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)    Urinary tract infection without hematuria    Tachycardia    Chest pain 07/13/2019   Epilepsy ([place]) 07/13/2019   Altered mental status    Atrial flutter ([place])    History of ST elevation myocardial infarction (STEMI) 06/20/2019   Dementia (HCC)    Chronic combined systolic (congestive) and diastolic (congestive) heart failure (HCC)    PVD (peripheral vascular disease) ([place])    History of stroke    Seizure ([place]) 09/11/2017   ESRD on hemodialysis ([place]) 09/11/2017   Elevated troponin 09/11/2017   Atrial fibrillation, chronic 09/11/2017   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[place])  09/11/2017   Essential hypertension 09/11/2017   Hypoglycemia 09/11/2017   Syncope 09/10/2017   Blindness    Bloody stools    Encounter for nasogastric (NG) tube placement    Lower GI bleed    Malnutrition of moderate degree 09/05/2016   Acute GI bleeding 09/04/2016    Past Surgical History:  Procedure Laterality Date   BELOW KNEE LEG AMPUTATION Left    CORONARY/GRAFT ACUTE MI REVASCULARIZATION N/A 06/20/2019   Procedure: Coronary/Graft Acute MI Revascularization;  Surgeon: Troy Sine, MD;  Location: [facility];  Service: Cardiovascular;  Laterality: N/A;   FLEXIBLE SIGMOIDOSCOPY N/A 09/07/2016   Procedure: FLEXIBLE SIGMOIDOSCOPY;  Surgeon:  Manus Gunning, MD;  Location: [facility];  Service: Gastroenterology;  Laterality: N/A;   IR PARACENTESIS  10/10/2020   IR THORACENTESIS ASP PLEURAL SPACE W/IMG GUIDE  10/11/2020   LEFT HEART CATH AND CORONARY ANGIOGRAPHY N/A 06/20/2019   Procedure: LEFT HEART CATH AND CORONARY ANGIOGRAPHY;  Surgeon: Troy Sine, MD;  Location: [facility];  Service: Cardiovascular;  Laterality: N/A;       Family History  Problem Relation Age of Onset   Other Mother        Pt unsure of PMH of family members.    Social History   Tobacco Use   Smoking status: Former   Smokeless tobacco: Never   Tobacco comments:    Pt thinks he used to smoke cigarettes.  Thinks he quit many years ago.  Vaping Use   Vaping Use: Never used  Substance Use Topics   Alcohol use: No   Drug use: No    Home Medications Prior to Admission medications   Medication Sig Start Date End Date Taking? Authorizing Provider  acetaminophen (TYLENOL) 325 MG tablet Take 650 mg by mouth in the morning, at noon, and at bedtime.   Yes [provider]  aspirin 325 MG tablet Take 325 mg by mouth daily.   Yes [provider]  atorvastatin (LIPITOR) 80 MG tablet Take 1 tablet (80 mg total) by mouth daily at 6 PM. Patient taking differently: Take 80 mg by mouth at bedtime. 06/22/19  Yes Kroeger, Daleen Snook M., PA-C  B Complex-C-Folic Acid (NEPHRO VITAMINS) 0.8 MG TABS Take 1 tablet by mouth daily.   Yes [provider]  docusate sodium (COLACE) 100 MG capsule Take 100 mg by mouth daily.   Yes [provider]  ferrous gluconate (FERGON) 324 MG tablet Take 324 mg by mouth daily with breakfast.   Yes [provider]  gabapentin (NEURONTIN) 100 MG capsule Take 100-200 mg by mouth 2 (two) times daily. Take 100mg  in the morning and 200mg  in the evening.   Yes [provider]  levETIRAcetam (KEPPRA) 500 MG tablet Take 1 tablet (500 mg total) by mouth 2 (two) times daily. 03/21/18   Yes Danford, Suann Larry, MD  lidocaine (LMX) 4 % cream Apply 1 application topically daily.   Yes [provider]  metoprolol succinate (TOPROL-XL) 25 MG 24 hr tablet Take 1 tablet (25 mg total) by mouth daily. 07/18/19 12/23/20 Yes Brimage, Vondra, DO  midodrine (PROAMATINE) 10 MG tablet Take 1 tablet (10 mg total) by mouth 3 (three) times daily with meals. Hold for sbp greater than 147 or diastolic greater than 90 10/17/54  Yes Ghimire, Dante Gang, MD  nitroGLYCERIN (NITROSTAT) 0.4 MG SL tablet Place 0.4 mg under the tongue every 5 (five) minutes x 3 doses as needed for chest pain.   Yes  [provider]  Nutritional Supplements (FEEDING SUPPLEMENT, NEPRO CARB STEADY,) LIQD Take 237 mLs by mouth 2 (two) times daily between meals.    Yes [provider]  omeprazole (PRILOSEC) 10 MG capsule Take 10 mg by mouth daily.   Yes [provider]  OXYGEN Inhale 1-5 L into the lungs as needed (May titrate to keep stats >90%).   Yes [provider]    Allergies    Patient has no known allergies.  Review of Systems   Review of Systems  Unable to perform ROS: Dementia  Respiratory:  Positive for cough.   Gastrointestinal:  Positive for vomiting (resolved?).  Neurological:  Positive for weakness.   Physical Exam Updated Vital Signs BP (!) 84/55 (BP Location: Right Arm)   Pulse 61   Temp (!) 97.5 F (36.4 C) (Oral)   Resp 16   SpO2 97%   Physical Exam Vitals and nursing note reviewed.  Constitutional:      General: He is not in acute distress.    Appearance: Normal appearance.  HENT:     Head: Normocephalic and atraumatic.  Eyes:     Extraocular Movements: Extraocular movements intact.     Conjunctiva/sclera: Conjunctivae normal.     Pupils: Pupils are equal, round, and reactive to light.  Cardiovascular:     Rate and Rhythm: Normal rate and regular rhythm.     Pulses: Normal pulses.  Pulmonary:     Effort: Pulmonary effort is normal. No  respiratory distress.     Breath sounds: Normal breath sounds. No wheezing.     Comments: Wet cough noted on exam, rales bilateral R>L. Abdominal:     General: There is no distension.     Palpations: Abdomen is soft. There is no mass.     Tenderness: There is abdominal tenderness. There is no guarding or rebound.     Comments: Edema of the abd. Mild  diffuse ttp  Musculoskeletal:        General: Normal range of motion.     Cervical back: Normal range of motion and neck supple.     Right lower leg: Edema present.     Left lower leg: Edema present.  Skin:    General: Skin is warm and dry.     Capillary Refill: Capillary refill takes less than 2 seconds.  Neurological:     Mental Status: He is alert.     Comments: Oriented to person and place, not time. Baseline per staff  Psychiatric:        Mood and Affect: Mood and affect normal.        Speech: Speech normal.        Behavior: Behavior normal.    ED Results / Procedures / Treatments   Labs (all labs ordered are listed, but only abnormal results are displayed) Labs Reviewed  CBC WITH DIFFERENTIAL/PLATELET - Abnormal; Notable for the following components:      Result Value   RBC 3.24 (*)    Hemoglobin 10.1 (*)    HCT 31.8 (*)    All other components within normal limits  CBG MONITORING, ED - Abnormal; Notable for the following components:   Glucose-Capillary 119 (*)    All other components within normal limits  I-STAT CHEM 8, ED - Abnormal; Notable for the following components:   Sodium 130 (*)    Potassium 5.7 (*)    BUN 56 (*)    Creatinine, Ser 5.90 (*)    Glucose, Bld 108 (*)  Calcium, Ion 0.80 (*)    Hemoglobin 11.6 (*)    HCT 34.0 (*)    All other components within normal limits  RESP PANEL BY RT-PCR (FLU A&B, COVID) ARPGX2  CBC WITH DIFFERENTIAL/PLATELET  COMPREHENSIVE METABOLIC PANEL  LACTIC ACID, PLASMA  LACTIC ACID, PLASMA    EKG EKG Interpretation  Date/Time:  Friday November 03 2020 13:52:42  EDT Ventricular Rate:  62 PR Interval:  64 QRS Duration: 116 QT Interval:  456 QTC Calculation: 462 R Axis:   192 Text Interpretation:  Suspect arm lead reversal, interpretation assumes no reversal Sinus rhythm with short PR with Premature supraventricular complexes Right ventricular hypertrophy Septal infarct , age undetermined Lateral infarct , age undetermined Inferior infarct , age undetermined Abnormal ECG No significant change since prior 8/22 Confirmed by Aletta Edouard [id number]) on 11/03/2020 2:35:59 PM  Radiology DG Chest 2 View  Result Date: 11/03/2020 CLINICAL DATA:  Cough EXAM: CHEST - 2 VIEW COMPARISON:  Chest radiograph 10/11/2020 FINDINGS: There is a right sided dialysis catheter in place, unchanged. There is a new large left pleural effusion obscuring the left heart border and layering up the lateral chest wall. A small portion of the left upper lobe is aerated. Patchy opacities in the right base likely reflect subsegmental atelectasis. The right lung is otherwise clear. There is no significant right effusion. There is no pneumothorax. A left axillary vascular stent is again seen. There is no acute osseous abnormality. IMPRESSION: Large left pleural effusion is new since 10/11/2020. A small portion of the left upper lobe remains aerated. Electronically Signed   By: Valetta Mole M.D.   On: 11/03/2020 15:04    Procedures Procedures   Medications Ordered in ED Medications  midodrine (PROAMATINE) tablet 10 mg (has no administration in time range)    ED Course  I have reviewed the triage vital signs and the nursing notes.  Pertinent labs & imaging results that were available during my care of the patient were reviewed by me and considered in my medical decision making (see chart for details).  Clinical Course as of 11/03/20 1601  Fri Nov 03, 3136  3216 70 year old male end-stage renal disease here for a low blood pressure cough from dialysis.  Did not receive dialysis today.   Blood pressure soft.  Getting labs chest x-ray COVID test.  Will likely need admission to the hospital. [MB]    Clinical Course User Index [MB] Hayden Rasmussen, MD   MDM Rules/Calculators/A&P                           Patient presenting for evaluation of hypotension and generalized weakness.  Also found to have a cough.  On exam, patient appears fluid overloaded.  He has edema of his legs and abdomen.  He has rales on exam.  Of note, patient is hypotensive with a systolic blood pressure in the 80s.  Baseline is in the 90s.  Patient is mentating well and MAP of 66.  We will continue to monitor closely.  However as patient appears fluid overloaded, will not give fluid bolus.  Will obtain labs, chest x-ray, COVID test.  Patient will likely need to be admitted.  Chem-8 shows slightly elevated potassium at 5.7.  Hemoglobin stable.  EKG without peaked T waves or significantly prolonged QT.  Will consult with nephrology.  Discussed with Dr. Royce Macadamia from nephrology who will evaluate the patient.  Will try to arrange dialysis for tonight.  Chest  x-ray viewed and independently interpreted by me, shows large left pleural effusion which is new.  Will call for admission.  Discussed with Dr. Tamala Julian from [facility] hospital[facility] service, pt to be admitted.   Final Clinical Impression(s) / ED Diagnoses Final diagnoses:  ESRD needing dialysis ([place])  Pleural effusion on left  Hypotension, unspecified hypotension type    Rx / DC Orders ED Discharge Orders     None        Franchot Heidelberg, PA-C 11/03/20 1601    Hayden Rasmussen, MD 11/03/20 [phone]

## 2020-11-03 NOTE — ED Triage Notes (Signed)
Patient BIB [facility] from [facility] after he was unable to go to dialysis today due to hypotension. [facility] also reports patient has had a cough. Patient has history of dementia and answers "yes" to all questions. Patient in no apparent distress at this time.  BP 94/60 HR 60 RR 18 CBG 131 SpO2 98% on 3L

## 2020-11-03 NOTE — ED Notes (Signed)
Pt transported to hemodialysis by primary RN

## 2020-11-03 NOTE — Progress Notes (Addendum)
Patient was declined to go a progressive bed on the floor due to low blood pressures although appears to be in no acute distress and mentating fine. Discussed with Dr. Duwayne Heck of critical care who agrees with hospitalist still admitting to ICU and PCCM would be available if consult needed.

## 2020-11-04 DIAGNOSIS — I5042 Chronic combined systolic (congestive) and diastolic (congestive) heart failure: Secondary | ICD-10-CM | POA: Diagnosis not present

## 2020-11-04 DIAGNOSIS — J90 Pleural effusion, not elsewhere classified: Secondary | ICD-10-CM | POA: Diagnosis not present

## 2020-11-04 DIAGNOSIS — I482 Chronic atrial fibrillation, unspecified: Secondary | ICD-10-CM | POA: Diagnosis not present

## 2020-11-04 DIAGNOSIS — J9611 Chronic respiratory failure with hypoxia: Secondary | ICD-10-CM

## 2020-11-04 DIAGNOSIS — L8990 Pressure ulcer of unspecified site, unspecified stage: Secondary | ICD-10-CM | POA: Insufficient documentation

## 2020-11-04 DIAGNOSIS — R569 Unspecified convulsions: Secondary | ICD-10-CM

## 2020-11-04 DIAGNOSIS — Z89519 Acquired absence of unspecified leg below knee: Secondary | ICD-10-CM

## 2020-11-04 DIAGNOSIS — N186 End stage renal disease: Secondary | ICD-10-CM | POA: Diagnosis not present

## 2020-11-04 NOTE — Assessment & Plan Note (Signed)
--  asymptomatic, continue aspirin, statin

## 2020-11-04 NOTE — Assessment & Plan Note (Addendum)
--  stable. Toprol on hold for low BP.  Felt to be a poor candidate for anticoagulation by cardiology in the past.

## 2020-11-04 NOTE — Assessment & Plan Note (Signed)
--  nephrology consultation, in past noted to not be a candidate for CRRT. Previous HD limited by chronic hypotension.

## 2020-11-04 NOTE — Assessment & Plan Note (Signed)
--  followed by urology, treated w/ Mills Koller.

## 2020-11-04 NOTE — Progress Notes (Signed)
I took over this patient care at 01:45 from [place].N.

## 2020-11-04 NOTE — Assessment & Plan Note (Signed)
--  stable, continue Keppra, gabapentin

## 2020-11-04 NOTE — Assessment & Plan Note (Addendum)
--  appears stable, continue to hold Toprol given BP; not on diuretic (ESRD) --Last EF noted to be around 20-25% with left ventricular global hypokinesis and moderate pulmonary hypertension at [facility] in 06/2020.

## 2020-11-04 NOTE — Assessment & Plan Note (Addendum)
--   Remains asymptomatic, however this is a major problem given inability to currently remove volume on hemodialysis.  Per nephrology not a candidate for CRRT.  Continue midodrine. --Reengage palliative care 9/21 if no line can be removed.

## 2020-11-04 NOTE — Assessment & Plan Note (Signed)
--  stable w/ last A1c 6.6. --monitor

## 2020-11-04 NOTE — Progress Notes (Addendum)
PROGRESS NOTE  George Burgess NID:[id number] DOB: 05-Nov-1950 DOA: 11/03/2020 PCP: Caprice Renshaw, MD  Brief History   70 year old man complicated PMH including ESRD, chronic hypotension, chronic combined CHF, cirrhosis, chronic hypoxic respiratory failure, metastatic prostate cancer, presenting with shortness of breath.  Admitted for acute hypoxic respiratory failure secondary to large left pleural effusion, complicated by recently missed dialysis session.  A & P  Recurrent left pleural effusion --mild hypoxia, but no resp symptoms, appears stable, proceed w/ HD first given missed session, then can try thoracentesis tomorrow  Hypotension --may limit HD. Not a candidate for CRRT. Continue midodrine. GOC update with PMT.  Cirrhosis of liver with ascites (HCC) --w/ associated ascities --asymptomatic, may need paracentesis, but not today  Type 2 diabetes mellitus with chronic kidney disease on chronic dialysis ([place]) --stable w/ last A1c 6.6. --monitor  Chronic combined systolic (congestive) and diastolic (congestive) heart failure (HCC) --appears stable, continue to hold Toprol given BP; not on diuretic (ESRD) --Last EF noted to be around 20-25% with left ventricular global hypokinesis and moderate pulmonary hypertension at [facility] in 06/2020.  ESRD on hemodialysis [facility]) --nephrology consultation, in past noted to not be a candidate for CRRT. Previous HD limited by chronic hypotension.  Chronic respiratory failure with hypoxia (HCC) --stable. Continue supplemental oxygen.  Dementia ([place]) --appears stable. Not on any outpt meds. Delirium precautions.  Atrial fibrillation, chronic --stable. Toprol on hold for low BP.  Felt to be a poor candidate for anticoagulation by cardiology in the past.  Seizure ([place]) --stable, continue Keppra, gabapentin  Prostate cancer, primary, with metastasis from prostate to other site [facility]) --followed by urology, treated w/ Mills Koller.  CAD (coronary  artery disease) --asymptomatic, continue aspirin, statin  PVD (peripheral vascular disease) (HCC) --s/p BKA. Stable.   Anemia due to chronic kidney disease --stable  Skin Assessment: I agree with the wound assessment as performed by the wound care RN as outlined below: Pressure Injury 11/04/20 Sacrum Medial Stage 2 -  Partial thickness loss of dermis presenting as a shallow open injury with a red, pink wound bed without slough. 2 x 2 (Active)  11/04/20 0052  Location: Sacrum  Location Orientation: Medial  Staging: Stage 2 -  Partial thickness loss of dermis presenting as a shallow open injury with a red, pink wound bed without slough.  Wound Description (Comments): 2 x 2  Present on Admission: Yes     Pressure Injury 11/04/20 Buttocks Left Stage 1 -  Intact skin with non-blanchable redness of a localized area usually over a bony prominence. left buttoch (Active)  11/04/20 0055  Location: Buttocks  Location Orientation: Left  Staging: Stage 1 -  Intact skin with non-blanchable redness of a localized area usually over a bony prominence.  Wound Description (Comments): left buttoch  Present on Admission: Yes   Disposition Plan:  Discussion: complicated case, prognosis guarded, will consult PMT  Status is: Inpatient  Remains inpatient appropriate because:Inpatient level of care appropriate due to severity of illness  Dispo: The patient is from: Home              Anticipated d/c is to:  TBD              Patient currently is not medically stable to d/c.   Difficult to place patient No  DVT prophylaxis: heparin injection 5,000 Units Start: 11/03/20 1700   Code Status: Full Code Level of care: Progressive Family Communication: brother [name] by telephone, gave consent for thoracentesis  Murray Hodgkins, MD  [facility] Direct contact: see [email or web address] (further directions at bottom of note if needed) 7PM-7AM contact night coverage as at bottom of note 11/04/2020, 10:48 AM   LOS: 1 day   Significant Hospital Events   9/16 admit for acute hypox resp failure secondary to left pleural effusion   Consults:  Nephrology  Palliative medicine   Procedures:  HD  Significant Diagnostic Tests:  9/16 CXR large left pleural effusion new   Micro Data:  None    Antimicrobials:  None   Interval History/Subjective  CC: f/u SOB  Confused Feels ok History unreliable  Objective   Vitals:  Vitals:   11/04/20 0504 11/04/20 0810  BP: 91/60 94/76  Pulse: (!) 55 71  Resp: 19 20  Temp: 97.7 F (36.5 C) 97.9 F (36.6 C)  SpO2: 95% 93%    Exam: Physical Exam Vitals and nursing note reviewed.  Constitutional:      General: He is not in acute distress.    Appearance: Normal appearance. He is not toxic-appearing.  Cardiovascular:     Rate and Rhythm: Normal rate. Rhythm irregular.     Heart sounds: No murmur heard.   No friction rub. No gallop.     Comments: Telemetry afib 70s Pulmonary:     Effort: Pulmonary effort is normal. No respiratory distress.     Breath sounds: Normal breath sounds. No wheezing, rhonchi or rales.  Abdominal:     General: There is distension.     Palpations: Abdomen is soft.     Tenderness: There is no abdominal tenderness. There is no guarding.     Comments: Ascites noted  Musculoskeletal:     Right lower leg: No edema.     Left lower leg: No edema.  Neurological:     Mental Status: He is alert.  Psychiatric:     Comments: Confused, speech intelligible but mostly not appropriate. Oriented to self.   I have personally reviewed the labs and other data, making special note of:   Today's Data  K+ WNL  Scheduled Meds:  aspirin  325 mg Oral Daily   atorvastatin  80 mg Oral QHS   Chlorhexidine Gluconate Cloth  6 each Topical Q0600   clopidogrel  75 mg Oral Daily   docusate sodium  100 mg Oral Daily   feeding supplement (NEPRO CARB STEADY)  237 mL Oral BID BM   gabapentin  100 mg Oral Daily   And   gabapentin  200 mg  Oral QHS   heparin  5,000 Units Subcutaneous Q8H   heparin sodium (porcine)       levETIRAcetam  500 mg Oral BID   lidocaine  1 application Topical Daily   midodrine  15 mg Oral TID WC   pantoprazole  20 mg Oral Daily   Continuous Infusions:  Active Problems:   Hypotension   Recurrent left pleural effusion   ESRD on hemodialysis (HCC)   Chronic combined systolic (congestive) and diastolic (congestive) heart failure (HCC)   Type 2 diabetes mellitus with chronic kidney disease on chronic dialysis (HCC)   Cirrhosis of liver with ascites (HCC)   Seizure (HCC)   Atrial fibrillation, chronic   Dementia (HCC)   Chronic respiratory failure with hypoxia (HCC)   Anemia due to chronic kidney disease   PVD (peripheral vascular disease) (HCC)   CAD (coronary artery disease)   Prostate cancer, primary, with metastasis from prostate to other site [facility])   Pressure injury of skin   Hx of BKA ([place])  LOS: 1 day   How to contact the [facility] Attending or Consulting provider [place] or covering provider during after hours [place], for this patient?  Check the care team in [facility] and look for a) attending/consulting TRH provider listed and b) the [facility] team listed Log into [email or web address] and use [place]'s universal password to access. If you do not have the password, please contact the hospital operator. Locate the [facility] provider you are looking for under [facility] and page to a number that you can be directly reached. If you still have difficulty reaching the provider, please page the [facility] (Director on Call) for the Hospitalists listed on amion for assistance.

## 2020-11-04 NOTE — Hospital Course (Addendum)
70 year old man complicated PMH including ESRD, chronic hypotension, chronic combined CHF, cirrhosis, chronic hypoxic respiratory failure, metastatic prostate cancer, presenting with shortness of breath.  Admitted for acute on chronic hypoxic respiratory failure, initially thought secondary to pleural effusion, however patient remains at baseline oxygen requirement despite presence of effusion.  Condition complicated by recently missed dialysis session.  He appears clinically stable with chronic hypotension, however he has not successfully dialyzed during hospitalization, no volume was able to be removed 9/19.  Next session 9/21.  If no volume can be removed, reengage with palliative care and brother.

## 2020-11-04 NOTE — Progress Notes (Signed)
[facility] Progress Note    Assessment/ Plan:   # ESRD:  -outpatient orders: [facility], MWF, EDW 75 kg, left upper extremity AVG (also has TDC), 4 hours, 400/600, 3K, 2.5 calcium, heparin 3800unit bolus (no maintenance) -next HD Monday, lowest temp possible w/ midodrine, albumin prn, uf profile.   #Pleural effusion, left -recommend thora;per primary, HD will likely not be able to fix this  #Cirrhosis w/ ascites -recommend para, per primary   # Volume/ chronic hypotension: EDW 75kg. UF 2L as tolerated, was over EDW when he presented to his HD units today (83kg) -increased midodrine to see if there will be any benefit -consider thora and paracentesis especially in light of underlying cirrhosis--defer to primary service   # Anemia of Chronic Kidney Disease: Hemoglobin 10.1. Will obtain records for ESA   # Secondary Hyperparathyroidism/Hyperphosphatemia: monitor phos    # Vascular access: has LUE AVG that is being cannulated, if here over the weekend, then can arrange for [facility] to be removed w/ IR  # Additional recommendations: - Dose all meds for creatinine clearance < 10 ml/min  - Unless absolutely necessary, no MRIs with gadolinium.  - Implement save arm precautions.  Prefer needle sticks in the dorsum of the hands or wrists.  No blood pressure measurements in arm. - If blood transfusion is requested during hemodialysis sessions, please alert [place] prior to the session.  - If a hemodialysis catheter line culture is requested, please alert [place] as only hemodialysis nurses are able to collect those specimens.    Gean Quint, MD [facility]  Subjective:   No acute events, tolerated HD, net UF 1L. No complaints currently.   Objective:   BP 94/76 (BP Location: Right Wrist)   Pulse 71   Temp 97.9 F (36.6 C) (Oral)   Resp 20   Wt 81.1 kg   SpO2 93%   BMI 24.25 kg/m   Intake/Output Summary (Last 24 hours) at 11/04/2020 1102 Last data filed at  11/03/2020 2328 Gross per 24 hour  Intake --  Output 1000 ml  Net -1000 ml   Weight change:   Physical Exam: Gen:ill appearing CVS:rrr Resp:decreased air entry left fields, normal WOB, on 2LNC EVO:[id number], soft, nt EXH:BZJI bka, trace edema rt LE Neuro: awake, alert HD access: RIJ TDC, LUE AVG  Imaging: DG Chest 2 View  Result Date: 11/03/2020 CLINICAL DATA:  Cough EXAM: CHEST - 2 VIEW COMPARISON:  Chest radiograph 10/11/2020 FINDINGS: There is a right sided dialysis catheter in place, unchanged. There is a new large left pleural effusion obscuring the left heart border and layering up the lateral chest wall. A small portion of the left upper lobe is aerated. Patchy opacities in the right base likely reflect subsegmental atelectasis. The right lung is otherwise clear. There is no significant right effusion. There is no pneumothorax. A left axillary vascular stent is again seen. There is no acute osseous abnormality. IMPRESSION: Large left pleural effusion is new since 10/11/2020. A small portion of the left upper lobe remains aerated. Electronically Signed   By: Valetta Mole M.D.   On: 11/03/2020 15:04    Labs: BMET Recent Labs  Lab 11/03/20 1424 11/03/20 1525  NA 130* 136  K 5.7* 4.8  CL 99 96*  CO2  --  27  GLUCOSE 108* 106*  BUN 56* 41*  CREATININE 5.90* 5.69*  CALCIUM  --  8.2*   CBC Recent Labs  Lab 11/03/20 1424 11/03/20 1500  WBC  --  6.8  NEUTROABS  --  4.9  HGB 11.6* 10.1*  HCT 34.0* 31.8*  MCV  --  98.1  PLT  --  220    Medications:     aspirin  325 mg Oral Daily   atorvastatin  80 mg Oral QHS   Chlorhexidine Gluconate Cloth  6 each Topical Q0600   clopidogrel  75 mg Oral Daily   docusate sodium  100 mg Oral Daily   feeding supplement (NEPRO CARB STEADY)  237 mL Oral BID BM   gabapentin  100 mg Oral Daily   And   gabapentin  200 mg Oral QHS   heparin  5,000 Units Subcutaneous Q8H   levETIRAcetam  500 mg Oral BID   lidocaine  1 application  Topical Daily   midodrine  15 mg Oral TID WC   pantoprazole  20 mg Oral Daily      Gean Quint, MD [facility] 11/04/2020, 11:02 AM

## 2020-11-04 NOTE — Assessment & Plan Note (Addendum)
--  stable. Continue supplemental oxygen.

## 2020-11-04 NOTE — Assessment & Plan Note (Addendum)
--  w/ associated ascities --remains asymptomatic, may need paracentesis, but not today

## 2020-11-04 NOTE — Assessment & Plan Note (Signed)
stable °

## 2020-11-04 NOTE — Progress Notes (Addendum)
     Referral received for [facility] :goals of care discussion. Chart reviewed and updates received from RN. Patient assessed and is unable to engage appropriately in discussions. Attempted to contact patient's brother, [name]. Unable to reach. Voicemail left with contact information given. Unable to reach sister.   Patient is familiar to [place] team. Last seen on 10/12/2020.   PMT will re-attempt to contact family at a later time/date. Detailed note and recommendations to follow once GOC has been completed.   Thank you for your referral and allowing PMT to assist in Mr./Mrs. Ade Margraf's care.   Alda Lea, AGPCNP-BC Palliative Medicine Team  Phone: [phone] Pager: [phone] Amion: [place]. [place]   NO CHARGE

## 2020-11-04 NOTE — Assessment & Plan Note (Signed)
--  s/p BKA. Stable.

## 2020-11-04 NOTE — Progress Notes (Signed)
Patient refused to have labs drawn this a.m.

## 2020-11-04 NOTE — Plan of Care (Signed)

## 2020-11-04 NOTE — Assessment & Plan Note (Addendum)
--  no resp symptoms, stable on chronic oxygen --I am not convinced significantly symptomatic from this.  Can try thoracentesis 9/21 after hemodialysis if needed.

## 2020-11-04 NOTE — Assessment & Plan Note (Signed)
--  appears stable. Not on any outpt meds. Delirium precautions.

## 2020-11-05 ENCOUNTER — Inpatient Hospital Stay (HOSPITAL_COMMUNITY): Payer: Medicare Other

## 2020-11-05 DIAGNOSIS — I959 Hypotension, unspecified: Secondary | ICD-10-CM | POA: Diagnosis not present

## 2020-11-05 DIAGNOSIS — N186 End stage renal disease: Secondary | ICD-10-CM | POA: Diagnosis not present

## 2020-11-05 DIAGNOSIS — I482 Chronic atrial fibrillation, unspecified: Secondary | ICD-10-CM | POA: Diagnosis not present

## 2020-11-05 DIAGNOSIS — I5042 Chronic combined systolic (congestive) and diastolic (congestive) heart failure: Secondary | ICD-10-CM | POA: Diagnosis not present

## 2020-11-05 LAB — RENAL FUNCTION PANEL
Albumin: 2.4 g/dL — ABNORMAL LOW (ref 3.5–5.0)
Anion gap: 15 (ref 5–15)
BUN: 30 mg/dL — ABNORMAL HIGH (ref 8–23)
CO2: 27 mmol/L (ref 22–32)
Calcium: 8.1 mg/dL — ABNORMAL LOW (ref 8.9–10.3)
Chloride: 93 mmol/L — ABNORMAL LOW (ref 98–111)
Creatinine, Ser: 4.5 mg/dL — ABNORMAL HIGH (ref 0.61–1.24)
GFR, Estimated: 13 mL/min — ABNORMAL LOW (ref >60)
Glucose, Bld: 121 mg/dL — ABNORMAL HIGH (ref 70–99)
Phosphorus: 4.6 mg/dL (ref 2.5–4.6)
Potassium: 4 mmol/L (ref 3.5–5.1)
Sodium: 135 mmol/L (ref 135–145)

## 2020-11-05 NOTE — Progress Notes (Addendum)
PROGRESS NOTE  George Burgess NIO:[id number] DOB: 09/20/1950 DOA: 11/03/2020 PCP: Caprice Renshaw, MD  Brief History   70 year old man complicated PMH including ESRD, chronic hypotension, chronic combined CHF, cirrhosis, chronic hypoxic respiratory failure, metastatic prostate cancer, presenting with shortness of breath.  Admitted for acute hypoxic respiratory failure secondary to large left pleural effusion, complicated by recently missed dialysis session. Low BP an issue w/ HD. Palliative care engaged.  A & P  * Hypotension --asymptomatic, stable --may limit HD. Not a candidate for CRRT. Continue midodrine. GOC update with PMT.  Recurrent left pleural effusion --mild hypoxia, but no resp symptoms --thoracentesis today, d/w brother [name] yesterday who reported he is decision maker  Cirrhosis of liver with ascites ([place]) --w/ associated ascities --asymptomatic, may need paracentesis, but not today  Type 2 diabetes mellitus with chronic kidney disease on chronic dialysis ([place]) --stable w/ last A1c 6.6. --monitor  Chronic combined systolic (congestive) and diastolic (congestive) heart failure (HCC) --appears stable, continue to hold Toprol given BP; not on diuretic (ESRD) --Last EF noted to be around 20-25% with left ventricular global hypokinesis and moderate pulmonary hypertension at [facility] in 06/2020.  ESRD on hemodialysis [facility]) --nephrology consultation, in past noted to not be a candidate for CRRT. Previous HD limited by chronic hypotension.  Chronic respiratory failure with hypoxia (HCC) --stable. Continue supplemental oxygen.  Dementia ([place]) --appears stable. Not on any outpt meds. Delirium precautions.  Atrial fibrillation, chronic --stable. Toprol on hold for low BP.  Felt to be a poor candidate for anticoagulation by cardiology in the past.  Seizure ([place]) --stable, continue Keppra, gabapentin  Prostate cancer, primary, with metastasis from prostate to other site  [facility]) --followed by urology, treated w/ [facility] Koller.  CAD (coronary artery disease) --asymptomatic, continue aspirin, statin  PVD (peripheral vascular disease) (HCC) --s/p BKA. Stable.   Anemia due to chronic kidney disease --stable  Skin Assessment: I agree with the wound assessment as performed by the wound care RN as outlined below: Pressure Injury 11/04/20 Sacrum Medial Stage 2 -  Partial thickness loss of dermis presenting as a shallow open injury with a red, pink wound bed without slough. 2 x 2 (Active)  11/04/20 0052  Location: Sacrum  Location Orientation: Medial  Staging: Stage 2 -  Partial thickness loss of dermis presenting as a shallow open injury with a red, pink wound bed without slough.  Wound Description (Comments): 2 x 2  Present on Admission: Yes     Pressure Injury 11/04/20 Buttocks Left Stage 1 -  Intact skin with non-blanchable redness of a localized area usually over a bony prominence. left buttoch (Active)  11/04/20 0055  Location: Buttocks  Location Orientation: Left  Staging: Stage 1 -  Intact skin with non-blanchable redness of a localized area usually over a bony prominence.  Wound Description (Comments): left buttoch  Present on Admission: Yes   Disposition Plan:  Discussion: complicated case, prognosis guarded, will consult PMT  Status is: Inpatient  Remains inpatient appropriate because:Inpatient level of care appropriate due to severity of illness  Dispo: The patient is from: Home              Anticipated d/c is to:  TBD              Patient currently is not medically stable to d/c.   Difficult to place patient No  DVT prophylaxis:    Code Status: Full Code Level of care: Progressive Family Communication: brother [name] by telephone, gave consent for thoracentesis  Murray Hodgkins, MD  [facility] Direct contact: see [email or web address] (further directions at bottom of note if needed) 7PM-7AM contact night coverage as at bottom of  note 11/05/2020, 9:43 AM  LOS: 2 days   Significant Hospital Events   9/16 admit for acute hypox resp failure secondary to left pleural effusion   Consults:  Nephrology  Palliative medicine   Procedures:  HD  Significant Diagnostic Tests:  9/16 CXR large left pleural effusion new   Micro Data:  None    Antimicrobials:  None   Interval History/Subjective  CC: f/u SOB  Feels fine No new issues History unreliable  Objective   Vitals:  Vitals:   11/05/20 0406 11/05/20 0847  BP: (!) 90/57 (!) 92/58  Pulse: 64 72  Resp: 15 20  Temp: (!) 97.4 F (36.3 C) (!) 97.5 F (36.4 C)  SpO2: 99% 98%    Exam: Physical Exam Vitals and nursing note reviewed.  Constitutional:      General: He is not in acute distress.    Appearance: He is not ill-appearing or toxic-appearing.  Cardiovascular:     Rate and Rhythm: Normal rate and regular rhythm.     Heart sounds: No murmur heard.   No friction rub. No gallop.  Pulmonary:     Effort: Pulmonary effort is normal. No respiratory distress.     Breath sounds: No wheezing, rhonchi or rales.     Comments: Poor air movement on left Musculoskeletal:     Right lower leg: No edema.     Left lower leg: No edema (left BKA).  Neurological:     Mental Status: He is alert.  Psychiatric:        Mood and Affect: Mood normal.        Behavior: Behavior normal.     Comments: confused   I have personally reviewed the labs and other data, making special note of:   Today's Data  K+ WNL today  Scheduled Meds:  aspirin  325 mg Oral Daily   atorvastatin  80 mg Oral QHS   Chlorhexidine Gluconate Cloth  6 each Topical Q0600   clopidogrel  75 mg Oral Daily   docusate sodium  100 mg Oral Daily   feeding supplement (NEPRO CARB STEADY)  237 mL Oral BID BM   gabapentin  100 mg Oral Daily   And   gabapentin  200 mg Oral QHS   levETIRAcetam  500 mg Oral BID   lidocaine  1 application Topical Daily   midodrine  15 mg Oral TID WC    pantoprazole  20 mg Oral Daily   Continuous Infusions:  Principal Problem:   Hypotension Active Problems:   Recurrent left pleural effusion   ESRD on hemodialysis (HCC)   Chronic combined systolic (congestive) and diastolic (congestive) heart failure (HCC)   Type 2 diabetes mellitus with chronic kidney disease on chronic dialysis (HCC)   Cirrhosis of liver with ascites (HCC)   Seizure (HCC)   Atrial fibrillation, chronic   Dementia (HCC)   Chronic respiratory failure with hypoxia (HCC)   Anemia due to chronic kidney disease   PVD (peripheral vascular disease) (HCC)   CAD (coronary artery disease)   Prostate cancer, primary, with metastasis from prostate to other site ([place])   Pressure injury of skin   Hx of BKA ([place])   LOS: 2 days   How to contact the [facility] Attending or Consulting provider 7A - 7P or covering provider during after hours 7P -7A, for this  patient?  Check the care team in [facility] and look for a) attending/consulting TRH provider listed and b) the [facility] team listed Log into [email or web address] and use [place]'s universal password to access. If you do not have the password, please contact the hospital operator. Locate the [facility] provider you are looking for under [facility] and page to a number that you can be directly reached. If you still have difficulty reaching the provider, please page the [facility] (Director on Call) for the Hospitalists listed on amion for assistance.

## 2020-11-05 NOTE — Progress Notes (Signed)
[facility] Progress Note    Assessment/ Plan:   # ESRD:  -outpatient orders: [facility], MWF, EDW 75 kg, left upper extremity AVG (also has TDC), 4 hours, 400/600, 3K, 2.5 calcium, heparin 3800unit bolus (no maintenance) -next HD Monday, lowest temp possible w/ midodrine, albumin prn, uf profile.   #Pleural effusion, left -recommend thora;per primary, HD will likely not be able to fix this  #Cirrhosis w/ ascites -recommend para, per primary   # Volume/ chronic hypotension: EDW 75kg. UF 2L as tolerated, was over EDW when he presented to his HD units today (83kg) -increased midodrine to see if there will be any benefit -consider thora and paracentesis especially in light of underlying cirrhosis--defer to primary service   # Anemia of Chronic Kidney Disease: Hemoglobin 10.1. Will obtain records for ESA   # Secondary Hyperparathyroidism/Hyperphosphatemia: monitor phos    # Vascular access: has LUE AVG that is being cannulated, if here over the weekend, then can arrange for [facility] to be removed w/ IR  # Additional recommendations: - Dose all meds for creatinine clearance < 10 ml/min  - Unless absolutely necessary, no MRIs with gadolinium.  - Implement save arm precautions.  Prefer needle sticks in the dorsum of the hands or wrists.  No blood pressure measurements in arm. - If blood transfusion is requested during hemodialysis sessions, please alert [place] prior to the session.  - If a hemodialysis catheter line culture is requested, please alert [place] as only hemodialysis nurses are able to collect those specimens.    Gean Quint, MD [facility]  Subjective:   No acute events, no complaints.   Objective:   BP (!) 92/58 (BP Location: Right Arm)   Pulse 72   Temp (!) 97.5 F (36.4 C) (Oral)   Resp 20   Wt 80.6 kg   SpO2 98%   BMI 24.10 kg/m   Intake/Output Summary (Last 24 hours) at 11/05/2020 0914 Last data filed at 11/04/2020 1700 Gross per 24  hour  Intake --  Output 0 ml  Net 0 ml   Weight change: 0.5 kg  Physical Exam: XLK:[id number] ill appearing, laying flat in bed CVS:rrr Resp:decreased air entry left fields, normal WOB, on 2LNC UYQ:[id number], soft, nt GLO:VFIE bka, very trace edema rt LE Neuro: awake HD access: RIJ TDC, LUE AVG  Imaging: DG Chest 2 View  Result Date: 11/03/2020 CLINICAL DATA:  Cough EXAM: CHEST - 2 VIEW COMPARISON:  Chest radiograph 10/11/2020 FINDINGS: There is a right sided dialysis catheter in place, unchanged. There is a new large left pleural effusion obscuring the left heart border and layering up the lateral chest wall. A small portion of the left upper lobe is aerated. Patchy opacities in the right base likely reflect subsegmental atelectasis. The right lung is otherwise clear. There is no significant right effusion. There is no pneumothorax. A left axillary vascular stent is again seen. There is no acute osseous abnormality. IMPRESSION: Large left pleural effusion is new since 10/11/2020. A small portion of the left upper lobe remains aerated. Electronically Signed   By: Valetta Mole M.D.   On: 11/03/2020 15:04    Labs: BMET Recent Labs  Lab 11/03/20 1424 11/03/20 1525 11/05/20 0110  NA 130* 136 135  K 5.7* 4.8 4.0  CL 99 96* 93*  CO2  --  27 27  GLUCOSE 108* 106* 121*  BUN 56* 41* 30*  CREATININE 5.90* 5.69* 4.50*  CALCIUM  --  8.2* 8.1*  PHOS  --   --  4.6   CBC Recent Labs  Lab 11/03/20 1424 11/03/20 1500  WBC  --  6.8  NEUTROABS  --  4.9  HGB 11.6* 10.1*  HCT 34.0* 31.8*  MCV  --  98.1  PLT  --  220    Medications:     aspirin  325 mg Oral Daily   atorvastatin  80 mg Oral QHS   Chlorhexidine Gluconate Cloth  6 each Topical Q0600   clopidogrel  75 mg Oral Daily   docusate sodium  100 mg Oral Daily   feeding supplement (NEPRO CARB STEADY)  237 mL Oral BID BM   gabapentin  100 mg Oral Daily   And   gabapentin  200 mg Oral QHS   levETIRAcetam  500 mg Oral BID    lidocaine  1 application Topical Daily   midodrine  15 mg Oral TID WC   pantoprazole  20 mg Oral Daily      Gean Quint, MD [facility] 11/05/2020, 9:14 AM

## 2020-11-05 NOTE — Progress Notes (Signed)
BP 75/53 HR 51. Notified MD. Will continue to monitor.  Era Bumpers, RN

## 2020-11-06 DIAGNOSIS — I5042 Chronic combined systolic (congestive) and diastolic (congestive) heart failure: Secondary | ICD-10-CM | POA: Diagnosis not present

## 2020-11-06 DIAGNOSIS — N186 End stage renal disease: Secondary | ICD-10-CM | POA: Diagnosis not present

## 2020-11-06 DIAGNOSIS — I482 Chronic atrial fibrillation, unspecified: Secondary | ICD-10-CM | POA: Diagnosis not present

## 2020-11-06 DIAGNOSIS — I959 Hypotension, unspecified: Secondary | ICD-10-CM | POA: Diagnosis not present

## 2020-11-06 LAB — CBC
HCT: 24.2 % — ABNORMAL LOW (ref 39.0–52.0)
Hemoglobin: 8 g/dL — ABNORMAL LOW (ref 13.0–17.0)
MCH: 31.7 pg (ref 26.0–34.0)
MCHC: 33.1 g/dL (ref 30.0–36.0)
MCV: 96 fL (ref 80.0–100.0)
Platelets: 201 10*3/uL (ref 150–400)
RBC: 2.52 MIL/uL — ABNORMAL LOW (ref 4.22–5.81)
RDW: 15.2 % (ref 11.5–15.5)
WBC: 6.3 10*3/uL (ref 4.0–10.5)
nRBC: 0 % (ref 0.0–0.2)

## 2020-11-06 LAB — RENAL FUNCTION PANEL
Albumin: 2.3 g/dL — ABNORMAL LOW (ref 3.5–5.0)
Anion gap: 13 (ref 5–15)
BUN: 36 mg/dL — ABNORMAL HIGH (ref 8–23)
CO2: 27 mmol/L (ref 22–32)
Calcium: 8.3 mg/dL — ABNORMAL LOW (ref 8.9–10.3)
Chloride: 91 mmol/L — ABNORMAL LOW (ref 98–111)
Creatinine, Ser: 5.48 mg/dL — ABNORMAL HIGH (ref 0.61–1.24)
GFR, Estimated: 10 mL/min — ABNORMAL LOW (ref >60)
Glucose, Bld: 121 mg/dL — ABNORMAL HIGH (ref 70–99)
Phosphorus: 5.5 mg/dL — ABNORMAL HIGH (ref 2.5–4.6)
Potassium: 4.5 mmol/L (ref 3.5–5.1)
Sodium: 131 mmol/L — ABNORMAL LOW (ref 135–145)

## 2020-11-06 LAB — HEPATITIS B SURFACE ANTIGEN: Hepatitis B Surface Ag: NONREACTIVE

## 2020-11-06 LAB — HEPATITIS B SURFACE ANTIBODY,QUALITATIVE: Hep B S Ab: REACTIVE — AB

## 2020-11-06 MED ORDER — SODIUM CHLORIDE 0.9 % IV SOLN: 100.0000 mL | INTRAVENOUS | Status: DC | PRN

## 2020-11-06 MED ORDER — ALBUMIN HUMAN 25 % IV SOLN
INTRAVENOUS | Status: AC
  Administered 2020-11-06: 25 g
  Filled 2020-11-06: qty 100

## 2020-11-06 MED ORDER — LIDOCAINE-PRILOCAINE 2.5-2.5 % EX CREA: 1.0000 "application " | TOPICAL_CREAM | CUTANEOUS | Status: DC | PRN

## 2020-11-06 MED ORDER — LIDOCAINE HCL (PF) 1 % IJ SOLN: 5.0000 mL | INTRAMUSCULAR | Status: DC | PRN

## 2020-11-06 MED ORDER — ALBUMIN HUMAN 25 % IV SOLN
INTRAVENOUS | Status: AC
  Filled 2020-11-06: qty 100

## 2020-11-06 MED ORDER — ALTEPLASE 2 MG IJ SOLR: 2.0000 mg | Freq: Once | INTRAMUSCULAR | Status: DC | PRN

## 2020-11-06 MED ORDER — HEPARIN SODIUM (PORCINE) 1000 UNIT/ML DIALYSIS
1000.0000 [IU] | INTRAMUSCULAR | Status: DC | PRN
  Filled 2020-11-06: qty 1

## 2020-11-06 MED ORDER — PENTAFLUOROPROP-TETRAFLUOROETH EX AERO: 1.0000 "application " | INHALATION_SPRAY | CUTANEOUS | Status: DC | PRN

## 2020-11-06 NOTE — Progress Notes (Signed)
[facility] ROUNDING NOTE   Subjective:   Interval History: 70 year old gentleman with history end-stage renal disease chronic hypotension combined congestive heart failure.  History of cirrhosis hypoxic respiratory failure metastatic prostate cancer.  He received his regular [facility] treatment [facility] [place].  He was admitted with hypotension and dyspnea.  Blood pressure 65/57 pulse 84 temperature 97.8 O2 sats 90% 2 L oxygen.  Last dialysis treatment 11/04/2018 with 1 L removed.  Sodium 131 potassium 4.5 chloride 91 CO2 27 BUN 36 creatinine 5.4 glucose 121 phosphorus 5.5 hemoglobin 10.1  Objective:  Vital signs in last 24 hours:  Temp:  [97.5 F (36.4 C)-98.1 F (36.7 C)] 97.8 F (36.6 C) (09/19 0500) Pulse Rate:  [50-99] 99 (09/19 0500) Resp:  [13-20] 20 (09/19 0500) BP: (75-102)/(53-77) 84/62 (09/19 0500) SpO2:  [95 %-100 %] 96 % (09/19 0500) Weight:  [81.1 kg-81.3 kg] 81.1 kg (09/19 0547)  Weight change: 0.7 kg Filed Weights   11/05/20 0406 11/06/20 0346 11/06/20 0547  Weight: 80.6 kg 81.3 kg 81.1 kg    Intake/Output: I/O last 3 completed shifts: In: 480 [P.O.:480] Out: 0    Intake/Output this shift:  No intake/output data recorded. JGG:[id number] ill appearing, laying flat in bed CVS:rrr Resp:decreased air entry left fields, normal WOB, on 2LNC YTK:[id number], soft, nt XNT:ZGYF bka, very trace edema rt LE Neuro: awake HD access: RIJ TDC, LUE    Basic Metabolic Panel: Recent Labs  Lab 11/03/20 1424 11/03/20 1525 11/05/20 0110 11/06/20 0130  NA 130* 136 135 131*  K 5.7* 4.8 4.0 4.5  CL 99 96* 93* 91*  CO2  --  27 27 27   GLUCOSE 108* 106* 121* 121*  BUN 56* 41* 30* 36*  CREATININE 5.90* 5.69* 4.50* 5.48*  CALCIUM  --  8.2* 8.1* 8.3*  PHOS  --   --  4.6 5.5*    Liver Function Tests: Recent Labs  Lab 11/03/20 1525 11/05/20 0110 11/06/20 0130  AST 43*  --   --   ALT 44  --   --   ALKPHOS 202*  --   --    BILITOT 1.0  --   --   PROT 6.2*  --   --   ALBUMIN 2.7* 2.4* 2.3*   No results for input(s): LIPASE, AMYLASE in the last 168 hours. No results for input(s): AMMONIA in the last 168 hours.  CBC: Recent Labs  Lab 11/03/20 1424 11/03/20 1500  WBC  --  6.8  NEUTROABS  --  4.9  HGB 11.6* 10.1*  HCT 34.0* 31.8*  MCV  --  98.1  PLT  --  220    Cardiac Enzymes: No results for input(s): CKTOTAL, CKMB, CKMBINDEX, TROPONINI in the last 168 hours.  BNP: Invalid input(s): POCBNP  CBG: Recent Labs  Lab 11/03/20 [place]*    Microbiology: Results for orders placed or performed during the hospital encounter of 11/03/20  Resp Panel by RT-PCR (Flu A&B, Covid) Nasopharyngeal Swab     Status: None   Collection Time: 11/03/20  3:24 PM   Specimen: Nasopharyngeal Swab; Nasopharyngeal(NP) swabs in vial transport medium  Result Value Ref Range Status   SARS Coronavirus 2 by RT PCR NEGATIVE NEGATIVE Final    Comment: (NOTE) SARS-CoV-2 target nucleic acids are NOT DETECTED.  The SARS-CoV-2 RNA is generally detectable in upper respiratory specimens during the acute phase of infection. The lowest concentration of SARS-CoV-2 viral copies this assay can detect is 138 copies/mL. A negative  result does not preclude SARS-Cov-2 infection and should not be used as the sole basis for treatment or other patient management decisions. A negative result may occur with  improper specimen collection/handling, submission of specimen other than nasopharyngeal swab, presence of viral mutation(s) within the areas targeted by this assay, and inadequate number of viral copies(<138 copies/mL). A negative result must be combined with clinical observations, patient history, and epidemiological information. The expected result is Negative.  Fact Sheet for Patients:  EntrepreneurPulse.com.au  Fact Sheet for Healthcare Providers:  IncredibleEmployment.be  This  test is no t yet approved or cleared by the [place] FDA and  has been authorized for detection and/or diagnosis of SARS-CoV-2 by FDA under an Emergency Use Authorization (EUA). This EUA will remain  in effect (meaning this test can be used) for the duration of the COVID-19 declaration under Section 564(b)(1) of the Act, 21 U.S.C.section 360bbb-3(b)(1), unless the authorization is terminated  or revoked sooner.       Influenza A by PCR NEGATIVE NEGATIVE Final   Influenza B by PCR NEGATIVE NEGATIVE Final    Comment: (NOTE) The Xpert Xpress SARS-CoV-2/FLU/RSV plus assay is intended as an aid in the diagnosis of influenza from Nasopharyngeal swab specimens and should not be used as a sole basis for treatment. Nasal washings and aspirates are unacceptable for Xpert Xpress SARS-CoV-2/FLU/RSV testing.  Fact Sheet for Patients: EntrepreneurPulse.com.au  Fact Sheet for Healthcare Providers: IncredibleEmployment.be  This test is not yet approved or cleared by the [place] FDA and has been authorized for detection and/or diagnosis of SARS-CoV-2 by FDA under an Emergency Use Authorization (EUA). This EUA will remain in effect (meaning this test can be used) for the duration of the COVID-19 declaration under Section 564(b)(1) of the Act, 21 U.S.C. section 360bbb-3(b)(1), unless the authorization is terminated or revoked.  Performed at [facility], [place] [address] [place], [place] [address]     Coagulation Studies: No results for input(s): LABPROT, INR in the last 72 hours.  Urinalysis: No results for input(s): COLORURINE, LABSPEC, PHURINE, GLUCOSEU, HGBUR, BILIRUBINUR, KETONESUR, PROTEINUR, UROBILINOGEN, NITRITE, LEUKOCYTESUR in the last 72 hours.  Invalid input(s): APPERANCEUR    Imaging: No results found.   Medications:     aspirin  325 mg Oral Daily   atorvastatin  80 mg Oral QHS   Chlorhexidine Gluconate Cloth  6 each  Topical Q0600   clopidogrel  75 mg Oral Daily   docusate sodium  100 mg Oral Daily   feeding supplement (NEPRO CARB STEADY)  237 mL Oral BID BM   gabapentin  100 mg Oral Daily   And   gabapentin  200 mg Oral QHS   levETIRAcetam  500 mg Oral BID   lidocaine  1 application Topical Daily   midodrine  15 mg Oral TID WC   pantoprazole  20 mg Oral Daily   acetaminophen **OR** acetaminophen, albuterol, ondansetron **OR** ondansetron (ZOFRAN) IV  Assessment/ Plan:  # ESRD:  -outpatient orders: [facility], MWF, EDW 75 kg, left upper extremity AVG (also has TDC), 4 hours, 400/600, 3K, 2.5 calcium, heparin 3800unit bolus (no maintenance) -Next dialysis 11/06/2020, lowest temp possible w/ midodrine, albumin prn, uf profile.    #Pleural effusion, left -recommend thora;per primary, HD will likely not be able to fix this   #Cirrhosis w/ ascites -recommend para, per primary   # Volume/ chronic hypotension: EDW 75kg. UF 2L as tolerated, was over EDW when he presented to his HD units today (83kg) -increased  midodrine to see if there will be any benefit -consider thora and paracentesis especially in light of underlying cirrhosis--defer to primary service   # Anemia of Chronic Kidney Disease: Hemoglobin 10.1. Will obtain records for ESA   # Secondary Hyperparathyroidism/Hyperphosphatemia: monitor phos    # Vascular access: has LUE AVG that is being cannulated, if here over the weekend, then can arrange for [facility] to be removed w/ IR  # Additional recommendations: - Dose all meds for creatinine clearance < 10 ml/min  - Unless absolutely necessary, no MRIs with gadolinium.  - Implement save arm precautions.  Prefer needle sticks in the dorsum of the hands or wrists.  No blood pressure measurements in arm. - If blood transfusion is requested during hemodialysis sessions, please alert [place] prior to the session.  - If a hemodialysis catheter line culture is requested, please alert [place] as only hemodialysis  nurses are able to collect those specimens.    LOS: [place] @TODAY @7 :08 AM

## 2020-11-06 NOTE — Progress Notes (Signed)
PROGRESS NOTE  George Burgess JGG:[id number] DOB: December 18, 1950 DOA: 11/03/2020 PCP: Caprice Renshaw, MD  Brief History   70 year old man complicated PMH including ESRD, chronic hypotension, chronic combined CHF, cirrhosis, chronic hypoxic respiratory failure, metastatic prostate cancer, presenting with shortness of breath.  Admitted for acute hypoxic respiratory failure secondary to large left pleural effusion, complicated by recently missed dialysis session. However, minimal oxygen requirement stable despite pleural effusion. Will attempt thoracentesis. Low BP an issue w/ HD. Palliative care engaged. Prognosis guarded.  A & P  * Hypotension --asymptomatic, stable --may limit HD. Not a candidate for CRRT. Continue midodrine. GOC update with PMT.  Recurrent left pleural effusion -- no resp symptoms, stable on chronic oxygen --I am not convinced significantly symptomatic from this, can try thoracentesis if pt can tolerate, d/w brother [name] who agreed and who reported he is decision maker  Cirrhosis of liver with ascites ([place]) --w/ associated ascities --asymptomatic, may need paracentesis, but not today  Type 2 diabetes mellitus with chronic kidney disease on chronic dialysis ([place]) --stable w/ last A1c 6.6. --monitor  Chronic combined systolic (congestive) and diastolic (congestive) heart failure (HCC) --appears stable, continue to hold Toprol given BP; not on diuretic (ESRD) --Last EF noted to be around 20-25% with left ventricular global hypokinesis and moderate pulmonary hypertension at [facility] in 06/2020.  ESRD on hemodialysis [facility]) --nephrology consultation, in past noted to not be a candidate for CRRT. Previous HD limited by chronic hypotension.  Chronic respiratory failure with hypoxia (HCC) --stable. Continue supplemental oxygen.  Dementia ([place]) --appears stable. Not on any outpt meds. Delirium precautions.  Atrial fibrillation, chronic --stable. Toprol on hold for low BP.   Felt to be a poor candidate for anticoagulation by cardiology in the past.  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  CAD (coronary artery disease) --asymptomatic, continue aspirin, statin  PVD (peripheral vascular disease) (HCC) --s/p BKA. Stable.   Anemia due to chronic kidney disease --stable  Disposition Plan:  Discussion: complicated case, prognosis guarded given chronic low BP  Status is: Inpatient  Remains inpatient appropriate because:Inpatient level of care appropriate due to severity of illness  Dispo: The patient is from: Home              Anticipated d/c is to:  TBD              Patient currently is not medically stable to d/c.   Difficult to place patient No  DVT prophylaxis:    Code Status: Full Code Level of care: Progressive Family Communication: will d/w brother today  Murray Hodgkins, MD  [facility] Direct contact: see [email or web address] (further directions at bottom of note if needed) 7PM-7AM contact night coverage as at bottom of note 11/06/2020, 10:04 AM  LOS: 3 days   Significant Hospital Events   9/16 admit for acute hypox resp failure secondary to left pleural effusion   Consults:  Nephrology  Palliative medicine   Procedures:  HD  Significant Diagnostic Tests:  9/16 CXR large left pleural effusion new   Micro Data:  None    Antimicrobials:  None   Interval History/Subjective  CC: f/u SOB  Feels fine, no complaints, eating ok  Objective   Vitals:  Vitals:   11/06/20 0738 11/06/20 0901  BP: (!) 81/56 (!) 71/41  Pulse: 78 75  Resp: 16 (!) 21  Temp: 97.8 F (36.6 C)   SpO2: 98%  Exam: Physical Exam Vitals reviewed.  Constitutional:      General: He is not in acute distress.    Appearance: He is not ill-appearing or toxic-appearing.  Cardiovascular:     Rate and Rhythm: Normal rate  and regular rhythm.     Heart sounds: No murmur heard.   No friction rub. No gallop.     Comments: Telemetry SR, PVCs Pulmonary:     Effort: Respiratory distress present.     Breath sounds: No wheezing, rhonchi or rales.  Musculoskeletal:     Right lower leg: No edema.     Left lower leg: No edema.     Comments: Left BKA  Neurological:     General: No focal deficit present.     Mental Status: He is alert.  Psychiatric:        Mood and Affect: Mood normal.   I have personally reviewed the labs and other data, making special note of:   Today's Data  K+ 4.5  Scheduled Meds:  aspirin  325 mg Oral Daily   atorvastatin  80 mg Oral QHS   Chlorhexidine Gluconate Cloth  6 each Topical Q0600   clopidogrel  75 mg Oral Daily   docusate sodium  100 mg Oral Daily   feeding supplement (NEPRO CARB STEADY)  237 mL Oral BID BM   gabapentin  100 mg Oral Daily   And   gabapentin  200 mg Oral QHS   levETIRAcetam  500 mg Oral BID   lidocaine  1 application Topical Daily   midodrine  15 mg Oral TID WC   pantoprazole  20 mg Oral Daily   Continuous Infusions:  Principal Problem:   Hypotension Active Problems:   Recurrent left pleural effusion   ESRD on hemodialysis (HCC)   Chronic combined systolic (congestive) and diastolic (congestive) heart failure (HCC)   Type 2 diabetes mellitus with chronic kidney disease on chronic dialysis (HCC)   Cirrhosis of liver with ascites (HCC)   Seizure (HCC)   Atrial fibrillation, chronic   Dementia (HCC)   Chronic respiratory failure with hypoxia (HCC)   Anemia due to chronic kidney disease   PVD (peripheral vascular disease) (HCC)   CAD (coronary artery disease)   Prostate cancer, primary, 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  Check the care team  in [facility] and look for a) attending/consulting TRH provider listed and b) the [facility] team listed Log into [email or web address] and use [place]'s universal password to access. If you do not have the password, please contact the hospital operator. Locate the [facility] provider you are looking for under [facility] and page to a number that you can be directly reached. If you still have difficulty reaching the provider, please page the [facility] (Director on Call) for the Hospitalists listed on amion for assistance.

## 2020-11-06 NOTE — Progress Notes (Addendum)
Palliative-   Consult received. Chart reviewed.  PMT tried x 2 last week to contact patient's brother to discuss [place].  I called again today and left a message.  Will await return call.   Please note that patient was seen in 2021 and during last admission- in both consults patient and patient's brother expressed that his family believed in all life prolonging medical care without the setting of any limits. See below following outtake from 10/12/20 note by Cleophus Molt, NP:   "We discussed patient's current illness and what it means in the larger context of patient's on-going co-morbidities.  Natural disease trajectory and expectations at EOL were discussed. We discuss patient's multiorgan failure - cardiac, respiratory, renal, and hepatic. Discuss that patient's mental status is also worsening - less responsive today than yesterday.    I attempted to elicit values and goals of care important to the patient.  His brother shares with me that patient has consistently requested full code full scope care in the past when he was able to make his wishes known. I ask him about patient's history of refusing HD and why he thinks he refused it - brother is unsure. I ask patient's brother to consider if patient's wishes would have changed given his decline and multiorgan failure. Patient's brother tells me that patient and his family have belief that all medical care to prolong life should be pursued. We discuss that unfortunately, despite aggressive medical care, it is likely that patient will continue to decline. We discuss that currently patient is unable to receive HD - it is unsafe d/t his low BP.    Encouraged patient's brother to consider DNR/DNI status understanding evidenced based poor outcomes in similar hospitalized patients, as the cause of the arrest is likely associated with chronic/terminal disease rather than a reversible acute cardio-pulmonary event. Patient's brother tells me he would like to  continue full code status.    Brother shares with me a hope for improvement. He tells me he will discuss goals of care with other family members however he also shares he does not think any family will agree to anything other than full code/full scope care.    Discussed with brother the importance of continued conversation with family and the medical providers regarding overall plan of care and treatment options, ensuring decisions are within the context of the patient's values and GOCs. "  There is also a MOST form present in [facility] indicating full code, full scope care.   PMT will await return call from patient's brother and will attempt to revisit goals of care discussion.   Mariana Kaufman, AGNP-C Palliative Medicine  Please call Palliative Medicine team phone with any questions [phone]. For individual providers please see AMION.  No charge

## 2020-11-07 DIAGNOSIS — I5042 Chronic combined systolic (congestive) and diastolic (congestive) heart failure: Secondary | ICD-10-CM | POA: Diagnosis not present

## 2020-11-07 DIAGNOSIS — I482 Chronic atrial fibrillation, unspecified: Secondary | ICD-10-CM | POA: Diagnosis not present

## 2020-11-07 DIAGNOSIS — K7460 Unspecified cirrhosis of liver: Secondary | ICD-10-CM | POA: Diagnosis not present

## 2020-11-07 DIAGNOSIS — Z7189 Other specified counseling: Secondary | ICD-10-CM

## 2020-11-07 DIAGNOSIS — Z515 Encounter for palliative care: Secondary | ICD-10-CM

## 2020-11-07 DIAGNOSIS — I959 Hypotension, unspecified: Secondary | ICD-10-CM | POA: Diagnosis not present

## 2020-11-07 DIAGNOSIS — N186 End stage renal disease: Secondary | ICD-10-CM | POA: Diagnosis not present

## 2020-11-07 DIAGNOSIS — J9611 Chronic respiratory failure with hypoxia: Secondary | ICD-10-CM | POA: Diagnosis not present

## 2020-11-07 DIAGNOSIS — F0150 Vascular dementia without behavioral disturbance: Secondary | ICD-10-CM | POA: Diagnosis not present

## 2020-11-07 LAB — RENAL FUNCTION PANEL
Albumin: 2.7 g/dL — ABNORMAL LOW (ref 3.5–5.0)
Anion gap: 12 (ref 5–15)
BUN: 24 mg/dL — ABNORMAL HIGH (ref 8–23)
CO2: 27 mmol/L (ref 22–32)
Calcium: 8.3 mg/dL — ABNORMAL LOW (ref 8.9–10.3)
Chloride: 95 mmol/L — ABNORMAL LOW (ref 98–111)
Creatinine, Ser: 3.95 mg/dL — ABNORMAL HIGH (ref 0.61–1.24)
GFR, Estimated: 15 mL/min — ABNORMAL LOW (ref >60)
Glucose, Bld: 124 mg/dL — ABNORMAL HIGH (ref 70–99)
Phosphorus: 4 mg/dL (ref 2.5–4.6)
Potassium: 3.6 mmol/L (ref 3.5–5.1)
Sodium: 134 mmol/L — ABNORMAL LOW (ref 135–145)

## 2020-11-07 LAB — HEPATITIS B SURFACE ANTIBODY, QUANTITATIVE: Hep B S AB Quant (Post): 13.7 m[IU]/mL (ref >9.9)

## 2020-11-07 MED ORDER — CHLORHEXIDINE GLUCONATE CLOTH 2 % EX PADS
6.0000 | MEDICATED_PAD | Freq: Every day | CUTANEOUS | Status: DC
  Administered 2020-11-08 – 2020-11-15 (×8): 6 via TOPICAL

## 2020-11-07 NOTE — Progress Notes (Addendum)
PROGRESS NOTE  George Burgess HCW:[id number] DOB: [birth year] DOA: 11/03/2020 PCP: Caprice Renshaw, MD  Brief History   70 year old man complicated PMH including ESRD, chronic hypotension, chronic combined CHF, cirrhosis, chronic hypoxic respiratory failure, metastatic prostate cancer, presenting with shortness of breath.  Admitted for acute on chronic hypoxic respiratory failure, initially thought secondary to pleural effusion, however patient remains at baseline oxygen requirement despite presence of effusion.  Condition complicated by recently missed dialysis session.  He appears clinically stable with chronic hypotension, however he has not successfully dialyzed during hospitalization, no volume was able to be removed 9/19.  Next session 9/21.  If no volume can be removed, reengage with palliative care and brother.  A & P  * Hypotension -- Remains asymptomatic, however this is a major problem given inability to currently remove volume on hemodialysis.  Per nephrology not a candidate for CRRT.  Continue midodrine. --Reengage palliative care 9/21 if no line can be removed.  Recurrent left pleural effusion --no resp symptoms, stable on chronic oxygen --I am not convinced significantly symptomatic from this.  Can try thoracentesis 9/21 after hemodialysis if needed.  Cirrhosis of liver with ascites (HCC) --w/ associated ascities --remains asymptomatic, may need paracentesis, but not today  Type 2 diabetes mellitus with chronic kidney disease on chronic dialysis ([place]) --stable w/ last A1c 6.6. --monitor  Chronic combined systolic (congestive) and diastolic (congestive) heart failure (HCC) --appears stable, continue to hold Toprol given BP; not on diuretic (ESRD) --Last EF noted to be around 20-25% with left ventricular global hypokinesis and moderate pulmonary hypertension at [facility] in 06/2020.  ESRD on hemodialysis [facility]) --nephrology consultation, in past noted to not be a candidate for  CRRT. Previous HD limited by chronic hypotension.  Chronic respiratory failure with hypoxia (HCC) --stable. Continue supplemental oxygen.  Dementia ([place]) --appears stable. Not on any outpt meds. Delirium precautions.  Atrial fibrillation, chronic --stable. Toprol on hold for low BP.  Felt to be a poor candidate for anticoagulation by cardiology in the past.  Seizure ([place]) --stable, continue Keppra, gabapentin  Prostate cancer, primary, with metastasis from prostate to other site [facility]) --followed by urology, treated w/ [facility].  CAD (coronary artery disease) --asymptomatic, continue aspirin, statin  PVD (peripheral vascular disease) (HCC) --s/p BKA. Stable.   Anemia due to chronic kidney disease --stable  Disposition Plan:  Discussion: Main issue is inability to remove volume secondary to chronic hypotension.  Palliative care engaged.  Status is: Inpatient  Remains inpatient appropriate because:Inpatient level of care appropriate due to severity of illness  Dispo: The patient is from: Home              Anticipated d/c is to:  TBD              Patient currently is not medically stable to d/c.   Difficult to place patient No  DVT prophylaxis:    Code Status: Full Code Level of care: Progressive Family Communication: updated brother 9/20, we discussed low bp and challenges to HD  Murray Hodgkins, MD  [facility] Direct contact: see [email or web address] (further directions at bottom of note if needed) 7PM-7AM contact night coverage as at bottom of note 11/07/2020, 4:13 PM  LOS: 4 days   Significant Hospital Events   9/16 admit for acute hypox resp failure secondary to left pleural effusion   Consults:  Nephrology  Palliative medicine   Procedures:  HD  Significant Diagnostic Tests:  9/16 CXR large left pleural effusion new   Micro Data:  None    Antimicrobials:  None   Interval History/Subjective  CC: f/u SOB  Feels fine, no complaints  Objective    Vitals:  Vitals:   11/07/20 1545 11/07/20 1612  BP: (!) 80/63 (!) 87/63  Pulse: 86 90  Resp: 16 18  Temp: 98.2 F (36.8 C) 98.8 F (37.1 C)  SpO2: 96% 94%    Exam: Physical Exam Vitals reviewed.  Constitutional:      General: He is not in acute distress. Cardiovascular:     Rate and Rhythm: Normal rate and regular rhythm.     Heart sounds: No murmur heard. Pulmonary:     Effort: Pulmonary effort is normal. No respiratory distress.     Breath sounds: No wheezing, rhonchi or rales.     Comments: Diminished breath sounds on the left Neurological:     Mental Status: He is alert. He is disoriented.  Psychiatric:        Mood and Affect: Mood normal.        Behavior: Behavior normal.   I have personally reviewed the labs and other data, making special note of:   Today's Data  K+ 3.6  Scheduled Meds:  aspirin  325 mg Oral Daily   atorvastatin  80 mg Oral QHS   [START ON 11/08/2020] Chlorhexidine Gluconate Cloth  6 each Topical Q0600   clopidogrel  75 mg Oral Daily   docusate sodium  100 mg Oral Daily   feeding supplement (NEPRO CARB STEADY)  237 mL Oral BID BM   gabapentin  100 mg Oral Daily   And   gabapentin  200 mg Oral QHS   levETIRAcetam  500 mg Oral BID   lidocaine  1 application Topical Daily   midodrine  15 mg Oral TID WC   pantoprazole  20 mg Oral Daily   Continuous Infusions:  Principal Problem:   Hypotension Active Problems:   Recurrent left pleural effusion   ESRD on hemodialysis (HCC)   Chronic combined systolic (congestive) and diastolic (congestive) heart failure (HCC)   Type 2 diabetes mellitus with chronic kidney disease on chronic dialysis (HCC)   Cirrhosis of liver with ascites (HCC)   Seizure (HCC)   Atrial fibrillation, chronic   Dementia (HCC)   Chronic respiratory failure with hypoxia (HCC)   Anemia due to chronic kidney disease   PVD (peripheral vascular disease) (HCC)   CAD (coronary artery disease)   Prostate cancer, primary,  with metastasis from prostate to other site ([place])   Pressure injury of skin   Hx of BKA ([place])   LOS: 4 days   How to contact the [facility] Attending or Consulting provider 7A - 7P or covering provider during after hours 7P -7A, for this patient?  Check the care team in [facility] and look for a) attending/consulting TR[facility] provider listed and b) the [facility] team listed Log into [email or web address] and use [place]'s universal password to access. If you do not have the password, please contact the hospital operator. Locate the [facility] provider you are looking for under [facility] and page to a number that you can be directly reached. If you still have difficulty reaching the provider, please page the [facility] (Director on Call) for the Hospitalists listed on amion for assistance.

## 2020-11-07 NOTE — Consult Note (Signed)
Consultation Note Date: 11/07/2020   Patient Name: George Burgess  DOB: 07-31-1950  MRN: [id number]  Age / Sex: 70 y.o., male  PCP: George Renshaw, MD Referring Physician: Samuella Cota, MD  Reason for Consultation: GOC  HPI/Patient Profile: 70 y.o. male  with past medical history of ESRD on HD, liver cirrhosis, CHF, metastatic prostate cancer, admitted on 11/03/2020 with shortness of breath. Workup revealed acute hypoxic respiratory failure due to L pleural effusion in the setting of missed dialysis. Admission and recovery have been complicated by hypotension. Palliative medicine consulted for [place].    Primary Decision Maker NEXT OF KIN - patient's brother- George Burgess  Discussion: Evaluated patient at bedside. He was awake, his face was covered with a sheet. He did not engage in discussion.  Discussed with patient's brother George Burgess over phone.  George Burgess shares that George Burgess is enrolled with [facility] Palliative program and he had his first visit last Thursday.  George Burgess feels that George Burgess is improving and looks as though he is at his baseline.  George Burgess continues to endorse full code and full scope care for patient without setting limits- he cites that this is his brother's request and he wishes to honor it.  Support given- George Burgess does not feel that George Burgess is suffering in any way at this point.  I discussed with George Burgess the concerns that George Burgess may not be able to tolerate dialysis due to his blood pressure and we may need to revisit the goals of care.    SUMMARY OF RECOMMENDATIONS -Continue full code full scope -Limits are not likely to be set by patient or family -If it is determined that patient is not tolerating dialysis and it will no longer be offered then PMT will revisit [place] with George Burgess -Continue to follow with outpatient Palliative through [place]  Planning: Full code   Prognosis:   Unable to determine  Discharge Planning: To Be Determined  Primary Diagnoses: Present on Admission:  Hypotension  PVD (peripheral vascular disease) ([place])  Dementia ([place])  Atrial fibrillation, chronic  Anemia due to chronic kidney disease  Chronic combined systolic (congestive) and diastolic (congestive) heart failure (HCC)  Cirrhosis of liver with ascites (HCC)  Recurrent left pleural effusion   Review of Systems  Physical Exam  Vital Signs: BP (!) 88/58 (BP Location: Right Arm)   Pulse 86   Temp (!) 97.5 F (36.4 C)   Resp 18   Wt 89 kg   SpO2 99%   BMI 26.61 kg/m  Pain Scale: 0-10   Pain Score: 1  (per pt, "not so much")   SpO2: SpO2: 99 % O2 Device:SpO2: 99 % O2 Flow Rate: .O2 Flow Rate (L/min): 2 L/min  IO: Intake/output summary:  Intake/Output Summary (Last 24 hours) at 11/07/2020 1048 Last data filed at 11/07/2020 0900 Gross per 24 hour  Intake 420 ml  Output -249 ml  Net 669 ml    LBM: Last BM Date: 11/05/20 (per report) Baseline Weight: Weight:  (  UNAVLE TO OBTAIN) Most recent weight: Weight: 89 kg        Thank you for this consult. Palliative medicine will continue to follow and assist as needed.   Time In: 1003 Time Out: 1059 Time Total: 57 minutes Greater than 50%  of this time was spent counseling and coordinating care related to the above assessment and plan.  Signed by: Mariana Kaufman, AGNP-C Palliative Medicine    Please contact Palliative Medicine Team phone at [phone] for questions and concerns.  For individual provider: See Shea Evans

## 2020-11-07 NOTE — Progress Notes (Signed)
[name] ROUNDING NOTE   Subjective:   Interval History: 70 year old gentleman with history end-stage renal disease chronic hypotension combined congestive heart failure.  History of cirrhosis hypoxic respiratory failure metastatic prostate cancer.  He [facility] [place].  He was admitted with hypotension and dyspnea.  Blood pressure 104/65 pulse 87 temperature 97.8 O2 sats 90% 2 L nasal cannula.  Last dialysis was 11/06/2020.  Unfortunately dialysis was limited by hypotension.  Unable to remove fluid.  Palliative medicine assisting  Sodium 134 potassium 3.6 chloride 95 CO2 27 BUN 24 creatinine 3.95 glucose 124 calcium 8.3 albumin 2.7 hemoglobin 8  Objective:  Vital signs in last 24 hours:  Temp:  [97.3 F (36.3 C)-98.8 F (37.1 C)] 97.8 F (36.6 C) (09/20 1142) Pulse Rate:  [61-88] 87 (09/20 1142) Resp:  [15-22] 18 (09/20 1142) BP: (73-104)/(47-71) 104/65 (09/20 1142) SpO2:  [96 %-100 %] 100 % (09/20 1142)  Weight change: 7.7 kg Filed Weights   11/06/20 0346 11/06/20 0547 11/06/20 0901  Weight: 81.3 kg 81.1 kg 89 kg    Intake/Output: I/O last 3 completed shifts: In: 41 [P.O.:420] Out: -249    Intake/Output this shift:  Total I/O In: 240 [P.O.:240] Out: -  YQI:HKVQQVZDGLO ill appearing, laying flat in bed CVS:rrr Resp:decreased air entry left fields, normal WOB, on 2LNC VFI:EPPIRJJOA, soft, nt CZY:SAYT bka, very trace edema rt LE Neuro: awake HD access: RIJ TDC, LUE    Basic Metabolic Panel: Recent Labs  Lab 11/03/20 1424 11/03/20 1525 11/03/20 1525 11/05/20 0110 11/06/20 0130 11/07/20 0139  NA 130* 136  --  135 131* 134*  K 5.7* 4.8  --  4.0 4.5 3.6  CL 99 96*  --  93* 91* 95*  CO2  --  27  --  27 27 27   GLUCOSE 108* 106*  --  121* 121* 124*  BUN 56* 41*  --  30* 36* 24*  CREATININE 5.90* 5.69*  --  4.50* 5.48* 3.95*  CALCIUM  --  8.2*   < > 8.1* 8.3* 8.3*  PHOS  --   --    --  4.6 5.5* 4.0   < > = values in this interval not displayed.     Liver Function Tests: Recent Labs  Lab 11/03/20 1525 11/05/20 0110 11/06/20 0130 11/07/20 0139  AST 43*  --   --   --   ALT 44  --   --   --   ALKPHOS 202*  --   --   --   BILITOT 1.0  --   --   --   PROT 6.2*  --   --   --   ALBUMIN 2.7* 2.4* 2.3* 2.7*    No results for input(s): LIPASE, AMYLASE in the last 168 hours. No results for input(s): AMMONIA in the last 168 hours.  CBC: Recent Labs  Lab 11/03/20 1424 11/03/20 1500 11/06/20 1157  WBC  --  6.8 6.3  NEUTROABS  --  4.9  --   HGB 11.6* 10.1* 8.0*  HCT 34.0* 31.8* 24.2*  MCV  --  98.1 96.0  PLT  --  220 201     Cardiac Enzymes: No results for input(s): CKTOTAL, CKMB, CKMBINDEX, TROPONINI in the last 168 hours.  BNP: Invalid input(s): POCBNP  CBG: Recent Labs  Lab 11/03/20 [place]*     Microbiology: Results for orders placed or performed during the hospital encounter of 11/03/20  Resp Panel  by RT-PCR (Flu A&B, Covid) Nasopharyngeal Swab     Status: None   Collection Time: 11/03/20  3:24 PM   Specimen: Nasopharyngeal Swab; Nasopharyngeal(NP) swabs in vial transport medium  Result Value Ref Range Status   SARS Coronavirus 2 by RT PCR NEGATIVE NEGATIVE Final    Comment: (NOTE) SARS-CoV-2 target nucleic acids are NOT DETECTED.  The SARS-CoV-2 RNA is generally detectable in upper respiratory specimens during the acute phase of infection. The lowest concentration of SARS-CoV-2 viral copies this assay can detect is 138 copies/mL. A negative result does not preclude SARS-Cov-2 infection and should not be used as the sole basis for treatment or other patient management decisions. A negative result may occur with  improper specimen collection/handling, submission of specimen other than nasopharyngeal swab, presence of viral mutation(s) within the areas targeted by this assay, and inadequate number of viral copies(<138  copies/mL). A negative result must be combined with clinical observations, patient history, and epidemiological information. The expected result is Negative.  Fact Sheet for Patients:  EntrepreneurPulse.com.au  Fact Sheet for Healthcare Providers:  IncredibleEmployment.be  This test is no t yet approved or cleared by the [place] FDA and  has been authorized for detection and/or diagnosis of SARS-CoV-2 by FDA under an Emergency Use Authorization (EUA). This EUA will remain  in effect (meaning this test can be used) for the duration of the COVID-19 declaration under Section 564(b)(1) of the Act, 21 U.S.C.section 360bbb-3(b)(1), unless the authorization is terminated  or revoked sooner.       Influenza A by PCR NEGATIVE NEGATIVE Final   Influenza B by PCR NEGATIVE NEGATIVE Final    Comment: (NOTE) The Xpert Xpress SARS-CoV-2/FLU/RSV plus assay is intended as an aid in the diagnosis of influenza from Nasopharyngeal swab specimens and should not be used as a sole basis for treatment. Nasal washings and aspirates are unacceptable for Xpert Xpress SARS-CoV-2/FLU/RSV testing.  Fact Sheet for Patients: EntrepreneurPulse.com.au  Fact Sheet for Healthcare Providers: IncredibleEmployment.be  This test is not yet approved or cleared by the [place] FDA and has been authorized for detection and/or diagnosis of SARS-CoV-2 by FDA under an Emergency Use Authorization (EUA). This EUA will remain in effect (meaning this test can be used) for the duration of the COVID-19 declaration under Section 564(b)(1) of the Act, 21 U.S.C. section 360bbb-3(b)(1), unless the authorization is terminated or revoked.  Performed at [facility], [place] [address] [place], [place] [address]     Coagulation Studies: No results for input(s): LABPROT, INR in the last 72 hours.  Urinalysis: No results for input(s):  COLORURINE, LABSPEC, PHURINE, GLUCOSEU, HGBUR, BILIRUBINUR, KETONESUR, PROTEINUR, UROBILINOGEN, NITRITE, LEUKOCYTESUR in the last 72 hours.  Invalid input(s): APPERANCEUR    Imaging: No results found.   Medications:     aspirin  325 mg Oral Daily   atorvastatin  80 mg Oral QHS   Chlorhexidine Gluconate Cloth  6 each Topical Q0600   clopidogrel  75 mg Oral Daily   docusate sodium  100 mg Oral Daily   feeding supplement (NEPRO CARB STEADY)  237 mL Oral BID BM   gabapentin  100 mg Oral Daily   And   gabapentin  200 mg Oral QHS   levETIRAcetam  500 mg Oral BID   lidocaine  1 application Topical Daily   midodrine  15 mg Oral TID WC   pantoprazole  20 mg Oral Daily   acetaminophen **OR** acetaminophen, albuterol, ondansetron **OR** ondansetron (ZOFRAN) IV  Assessment/ Plan:  #  ESRD:  -outpatient orders: [facility], MWF, EDW 75 kg, left upper extremity AVG (also has TDC), 4 hours, 400/600, 3K, 2.5 calcium, heparin 3800unit bolus (no maintenance)  Next dialysis treatment will be 11/08/2020   #Pleural effusion, left -recommend thora;per primary, HD will likely not be able to fix this   #Cirrhosis w/ ascites -recommend para, per primary   # Volume/ chronic hypotension: EDW 75kg.  Ultrafiltration limited by hypotension.  Currently receiving 15 mg of midodrine 3 times daily   # Anemia of Chronic Kidney Disease: Hemoglobin 10.1. Will obtain records for ESA   # Secondary Hyperparathyroidism/Hyperphosphatemia: monitor phos    # Vascular access: has LUE AVG that is being cannulated, if here over the weekend, then can arrange for [facility] to be removed w/ IR    LOS: [place] @TODAY @11 :51 AM

## 2020-11-08 DIAGNOSIS — I482 Chronic atrial fibrillation, unspecified: Secondary | ICD-10-CM | POA: Diagnosis not present

## 2020-11-08 DIAGNOSIS — C61 Malignant neoplasm of prostate: Secondary | ICD-10-CM

## 2020-11-08 DIAGNOSIS — N186 End stage renal disease: Secondary | ICD-10-CM | POA: Diagnosis not present

## 2020-11-08 DIAGNOSIS — I5042 Chronic combined systolic (congestive) and diastolic (congestive) heart failure: Secondary | ICD-10-CM | POA: Diagnosis not present

## 2020-11-08 DIAGNOSIS — I959 Hypotension, unspecified: Secondary | ICD-10-CM | POA: Diagnosis not present

## 2020-11-08 LAB — RENAL FUNCTION PANEL
Albumin: 2.5 g/dL — ABNORMAL LOW (ref 3.5–5.0)
Anion gap: 12 (ref 5–15)
BUN: 34 mg/dL — ABNORMAL HIGH (ref 8–23)
CO2: 27 mmol/L (ref 22–32)
Calcium: 8.5 mg/dL — ABNORMAL LOW (ref 8.9–10.3)
Chloride: 96 mmol/L — ABNORMAL LOW (ref 98–111)
Creatinine, Ser: 4.65 mg/dL — ABNORMAL HIGH (ref 0.61–1.24)
GFR, Estimated: 13 mL/min — ABNORMAL LOW (ref >60)
Glucose, Bld: 147 mg/dL — ABNORMAL HIGH (ref 70–99)
Phosphorus: 4.1 mg/dL (ref 2.5–4.6)
Potassium: 3.9 mmol/L (ref 3.5–5.1)
Sodium: 135 mmol/L (ref 135–145)

## 2020-11-08 MED ORDER — SODIUM CHLORIDE 0.9 % IV SOLN: 100.0000 mL | INTRAVENOUS | Status: DC | PRN

## 2020-11-08 MED ORDER — ALTEPLASE 2 MG IJ SOLR: 2.0000 mg | Freq: Once | INTRAMUSCULAR | Status: DC | PRN

## 2020-11-08 MED ORDER — ALBUMIN HUMAN 25 % IV SOLN
INTRAVENOUS | Status: AC
  Administered 2020-11-08: 12.5 g
  Filled 2020-11-08: qty 50

## 2020-11-08 MED ORDER — PENTAFLUOROPROP-TETRAFLUOROETH EX AERO: 1.0000 "application " | INHALATION_SPRAY | CUTANEOUS | Status: DC | PRN

## 2020-11-08 MED ORDER — HEPARIN SODIUM (PORCINE) 1000 UNIT/ML DIALYSIS
1000.0000 [IU] | INTRAMUSCULAR | Status: DC | PRN
  Filled 2020-11-08: qty 1

## 2020-11-08 MED ORDER — DARBEPOETIN ALFA 150 MCG/0.3ML IJ SOSY
150.0000 ug | PREFILLED_SYRINGE | INTRAMUSCULAR | Status: DC
  Administered 2020-11-08: 150 ug via INTRAVENOUS
  Filled 2020-11-08 (×2): qty 0.3

## 2020-11-08 MED ORDER — LIDOCAINE HCL (PF) 1 % IJ SOLN: 5.0000 mL | INTRAMUSCULAR | Status: DC | PRN

## 2020-11-08 MED ORDER — LIDOCAINE-PRILOCAINE 2.5-2.5 % EX CREA: 1.0000 "application " | TOPICAL_CREAM | CUTANEOUS | Status: DC | PRN

## 2020-11-08 NOTE — Care Management Important Message (Signed)
Important Message  Patient Details  Name: George Burgess MRN: [id number] Date of Birth: [birth year]   Medicare Important Message Given:  Yes     Deni Lefever [address] 11/08/2020, 11:53 AM

## 2020-11-08 NOTE — Progress Notes (Signed)
[facility] ROUNDING NOTE   Subjective:   Interval History: 70 year old gentleman with history end-stage renal disease chronic hypotension combined congestive heart failure.  History of cirrhosis hypoxic respiratory failure metastatic prostate cancer.  He received his regular dialysis treatment at [facility] in [place].  He was admitted with hypotension and dyspnea.  Blood pressure 82/48 pulse 91 temperature 97.8 O2 sats 99% 2 L nasal cannula.  Last dialysis was 11/06/2020.  Unfortunately dialysis was limited by hypotension.  Unable to remove fluid.  Palliative medicine assisting.  Next dialysis treatment 11/08/2020  Sodium 135 potassium 3.9 chloride 96 CO2 27 BUN 34 creatinine 4.65 glucose 147 calcium 8.5 phosphorus 4.1 albumin 2.5 hemoglobin 8.0  Objective:  Vital signs in last 24 hours:  Temp:  [97.5 F (36.4 C)-98.8 F (37.1 C)] 97.8 F (36.6 C) (09/21 0719) Pulse Rate:  [80-91] 91 (09/21 0719) Resp:  [16-22] 17 (09/21 0719) BP: (80-104)/(48-65) 82/48 (09/21 0719) SpO2:  [94 %-100 %] 99 % (09/21 0719) Weight:  [88.3 kg] 88.3 kg (09/21 0320)  Weight change: -0.7 kg Filed Weights   11/06/20 0547 11/06/20 0901 11/08/20 0320  Weight: 81.1 kg 89 kg 88.3 kg    Intake/Output: I/O last 3 completed shifts: In: 800 [P.O.:800] Out: 0    Intake/Output this shift:  No intake/output data recorded. IRS:WNIOEVOJJKK ill appearing, laying flat in bed CVS:rrr Resp:decreased air entry left fields, normal WOB, on 2LNC XFG:HWEXHBZJI, soft, nt RCV:ELFY bka, very trace edema rt LE Neuro: awake HD access: RIJ TDC, LUE    Basic Metabolic Panel: Recent Labs  Lab 11/03/20 1525 11/05/20 0110 11/06/20 0130 11/07/20 0139 11/08/20 0143  NA 136 135 131* 134* 135  K 4.8 4.0 4.5 3.6 3.9  CL 96* 93* 91* 95* 96*  CO2 27 27 27 27 27   GLUCOSE 106* 121* 121* 124* 147*  BUN 41* 30* 36* 24* 34*  CREATININE 5.69* 4.50* 5.48* 3.95* 4.65*  CALCIUM 8.2* 8.1* 8.3* 8.3*  8.5*  PHOS  --  4.6 5.5* 4.0 4.1     Liver Function Tests: Recent Labs  Lab 11/03/20 1525 11/05/20 0110 11/06/20 0130 11/07/20 0139 11/08/20 0143  AST 43*  --   --   --   --   ALT 44  --   --   --   --   ALKPHOS 202*  --   --   --   --   BILITOT 1.0  --   --   --   --   PROT 6.2*  --   --   --   --   ALBUMIN 2.7* 2.4* 2.3* 2.7* 2.5*    No results for input(s): LIPASE, AMYLASE in the last 168 hours. No results for input(s): AMMONIA in the last 168 hours.  CBC: Recent Labs  Lab 11/03/20 1424 11/03/20 1500 11/06/20 1157  WBC  --  6.8 6.3  NEUTROABS  --  4.9  --   HGB 11.6* 10.1* 8.0*  HCT 34.0* 31.8* 24.2*  MCV  --  98.1 96.0  PLT  --  220 201     Cardiac Enzymes: No results for input(s): CKTOTAL, CKMB, CKMBINDEX, TROPONINI in the last 168 hours.  BNP: Invalid input(s): POCBNP  CBG: Recent Labs  Lab 11/03/20 [place]*     Microbiology: Results for orders placed or performed during the hospital encounter of 11/03/20  Resp Panel by RT-PCR (Flu A&B, Covid) Nasopharyngeal Swab     Status: None   Collection Time: 11/03/20  3:24 PM   Specimen: Nasopharyngeal Swab; Nasopharyngeal(NP) swabs in vial transport medium  Result Value Ref Range Status   SARS Coronavirus 2 by RT PCR NEGATIVE NEGATIVE Final    Comment: (NOTE) SARS-CoV-2 target nucleic acids are NOT DETECTED.  The SARS-CoV-2 RNA is generally detectable in upper respiratory specimens during the acute phase of infection. The lowest concentration of SARS-CoV-2 viral copies this assay can detect is 138 copies/mL. A negative result does not preclude SARS-Cov-2 infection and should not be used as the sole basis for treatment or other patient management decisions. A negative result may occur with  improper specimen collection/handling, submission of specimen other than nasopharyngeal swab, presence of viral mutation(s) within the areas targeted by this assay, and inadequate number of  viral copies(<138 copies/mL). A negative result must be combined with clinical observations, patient history, and epidemiological information. The expected result is Negative.  Fact Sheet for Patients:  EntrepreneurPulse.com.au  Fact Sheet for Healthcare Providers:  IncredibleEmployment.be  This test is no t yet approved or cleared by the [place] FDA and  has been authorized for detection and/or diagnosis of SARS-CoV-2 by FDA under an Emergency Use Authorization (EUA). This EUA will remain  in effect (meaning this test can be used) for the duration of the COVID-19 declaration under Section 564(b)(1) of the Act, 21 U.S.C.section 360bbb-3(b)(1), unless the authorization is terminated  or revoked sooner.       Influenza A by PCR NEGATIVE NEGATIVE Final   Influenza B by PCR NEGATIVE NEGATIVE Final    Comment: (NOTE) The Xpert Xpress SARS-CoV-2/FLU/RSV plus assay is intended as an aid in the diagnosis of influenza from Nasopharyngeal swab specimens and should not be used as a sole basis for treatment. Nasal washings and aspirates are unacceptable for Xpert Xpress SARS-CoV-2/FLU/RSV testing.  Fact Sheet for Patients: EntrepreneurPulse.com.au  Fact Sheet for Healthcare Providers: IncredibleEmployment.be  This test is not yet approved or cleared by the [place] FDA and has been authorized for detection and/or diagnosis of SARS-CoV-2 by FDA under an Emergency Use Authorization (EUA). This EUA will remain in effect (meaning this test can be used) for the duration of the COVID-19 declaration under Section 564(b)(1) of the Act, 21 U.S.C. section 360bbb-3(b)(1), unless the authorization is terminated or revoked.  Performed at [facility], [place] [address] [place], [place] [address]     Coagulation Studies: No results for input(s): LABPROT, INR in the last 72 hours.  Urinalysis: No results  for input(s): COLORURINE, LABSPEC, PHURINE, GLUCOSEU, HGBUR, BILIRUBINUR, KETONESUR, PROTEINUR, UROBILINOGEN, NITRITE, LEUKOCYTESUR in the last 72 hours.  Invalid input(s): APPERANCEUR    Imaging: No results found.   Medications:    sodium chloride     sodium chloride      aspirin  325 mg Oral Daily   atorvastatin  80 mg Oral QHS   Chlorhexidine Gluconate Cloth  6 each Topical Q0600   clopidogrel  75 mg Oral Daily   docusate sodium  100 mg Oral Daily   feeding supplement (NEPRO CARB STEADY)  237 mL Oral BID BM   gabapentin  100 mg Oral Daily   And   gabapentin  200 mg Oral QHS   levETIRAcetam  500 mg Oral BID   lidocaine  1 application Topical Daily   midodrine  15 mg Oral TID WC   pantoprazole  20 mg Oral Daily   sodium chloride, sodium chloride, acetaminophen **OR** acetaminophen, albuterol, alteplase, heparin, lidocaine (PF), lidocaine-prilocaine, ondansetron **OR** ondansetron (ZOFRAN) IV, pentafluoroprop-tetrafluoroeth  Assessment/ Plan:  # ESRD:  -outpatient orders: [facility], MWF, EDW 75 kg, left upper extremity AVG (also has TDC), 4 hours, 400/600, 3K, 2.5 calcium, heparin 3800unit bolus (no maintenance)  Next dialysis treatment will be 11/08/2020   #Pleural effusion, left -recommend thora;per primary, HD will likely not be able to fix this   #Cirrhosis w/ ascites -recommend para, per primary   # Volume/ chronic hypotension: EDW 75kg.  Ultrafiltration limited by hypotension.  Currently receiving 15 mg of midodrine 3 times daily   # Anemia of Chronic Kidney Disease: Drop in hemoglobin 8.0 we will add ESA   # Secondary Hyperparathyroidism/Hyperphosphatemia: monitor phos    # Vascular access: has LUE AVG that is being cannulated, if here over the weekend, then can arrange for [facility] to be removed w/ IR    LOS: [place] @TODAY @7 :35 AM

## 2020-11-08 NOTE — Progress Notes (Signed)
PROGRESS NOTE    George Burgess  MVH:[id number] DOB: 1950/03/07 DOA: 11/03/2020 PCP: Caprice Renshaw, MD   Brief Narrative: Taken from prior notes. 70 year old man complicated PMH including ESRD, chronic hypotension, chronic combined CHF, cirrhosis, chronic hypoxic respiratory failure, metastatic prostate cancer, presenting with shortness of breath.  Admitted for acute on chronic hypoxic respiratory failure, initially thought secondary to pleural effusion, however patient remains at baseline oxygen requirement despite presence of effusion.  Condition complicated by recently missed dialysis session.  He appears clinically stable with chronic hypotension, however he has not successfully dialyzed during hospitalization, no volume was able to be removed 9/19.  Patient had another dialysis today-blood pressure becoming soft even with albumin.  Unable to do much volume removal due to softer blood pressure. Thoracentesis was ordered. Palliative care was consulted-patient and family decided to remain full code with full scope of medical care.  If continue not to tolerate dialysis then palliative care needs to revisit and go to hospice home.  Subjective: Patient was seen during dialysis.  Denies any new complaints but appears lethargic.  Assessment & Plan:   Principal Problem:   Hypotension Active Problems:   Seizure ([place])   ESRD on hemodialysis (HCC)   Atrial fibrillation, chronic   Anemia due to chronic kidney disease   Dementia (HCC)   Chronic combined systolic (congestive) and diastolic (congestive) heart failure (HCC)   PVD (peripheral vascular disease) (HCC)   CAD (coronary artery disease)   Prostate cancer, primary, with metastasis from prostate to other site [facility])   Type 2 diabetes mellitus with chronic kidney disease on chronic dialysis ([place])   Cirrhosis of liver with ascites ([place])   Recurrent left pleural effusion   Pressure injury of skin   Chronic respiratory failure with hypoxia  (HCC)   Hx of BKA (HCC)  Hypotension.  Blood pressure remained soft during dialysis despite giving albumin.  Unable to remove much fluid.  He is not a candidate for CRRT per nephrology. -Continue with midodrine -Might not be able to tolerate dialysis as unable to remove fluid. -Currently wants to remain full code with full scope of care.  Recurrent left pleural effusion.  Currently stable on home oxygen requirement of 2 L. -Left-sided thoracentesis ordered as unable to remove much fluid with dialysis.  Cirrhosis of liver with ascites. -Continue to monitor  Type 2 diabetes mellitus with chronic kidney disease on chronic dialysis (HCC) --stable w/ last A1c 6.6. --monitor   Chronic combined systolic (congestive) and diastolic (congestive) heart failure (HCC) --appears stable, continue to hold Toprol given BP; not on diuretic (ESRD) --Last EF noted to be around 20-25% with left ventricular global hypokinesis and moderate pulmonary hypertension at [facility] in 06/2020.   ESRD on hemodialysis [facility]) --nephrology consultation, in past noted to not be a candidate for CRRT. Previous HD limited by chronic hypotension.   Chronic respiratory failure with hypoxia (HCC) --stable. Continue supplemental oxygen.   Dementia ([place]) --appears stable. Not on any outpt meds. Delirium precautions.   Atrial fibrillation, chronic --stable. Toprol on hold for low BP.  Felt to be a poor candidate for anticoagulation by cardiology in the past.   Seizure ([place]) --stable, continue Keppra, gabapentin   Prostate cancer, primary, with metastasis from prostate to other site [facility]) --followed by urology, treated w/ [facility].   CAD (coronary artery disease) --asymptomatic, continue aspirin, statin   PVD (peripheral vascular disease) (HCC) --s/p BKA. Stable.    Anemia due to chronic kidney disease --stable -Continue with Aranesp  Objective: Vitals:   11/08/20 1200 11/08/20 1226 11/08/20 1232 11/08/20  1301  BP: (!) 84/65 107/64 99/77 106/60  Pulse: 90 97 (!) 56   Resp: (!) 21 (!) 21 (!) 21 19  Temp:    97.7 F (36.5 C)  TempSrc:    Oral  SpO2:    95%  Weight:        Intake/Output Summary (Last 24 hours) at 11/08/2020 1412 Last data filed at 11/08/2020 1226 Gross per 24 hour  Intake 320 ml  Output 1490 ml  Net -1170 ml   Filed Weights   11/06/20 0901 11/08/20 0320 11/08/20 0821  Weight: 89 kg 88.3 kg 85.2 kg    Examination:  General exam: Chronically ill-appearing elderly man, appears calm and comfortable  Respiratory system: Clear to auscultation. Respiratory effort normal, decreased breath sound at bases Cardiovascular system: S1 & S2 heard, RRR.  Gastrointestinal system: Soft, nontender, nondistended, bowel sounds positive. Central nervous system: Alert and oriented. No focal neurological deficits. Extremities: 1+ RLE edema, left BKA Psychiatry: Judgement and insight appear normal.   DVT prophylaxis:  Code Status: Full Family Communication: Discussed with patient Disposition Plan:  Status is: Inpatient  Remains inpatient appropriate because:Inpatient level of care appropriate due to severity of illness  Dispo: The patient is from: Home              Anticipated d/c is to:  To be determined              Patient currently is not medically stable to d/c.   Difficult to place patient No               Level of care: Progressive  All the records are reviewed and case discussed with Care Management/Social Worker. Management plans discussed with the patient, nursing and they are in agreement.  Consultants:  Nephrology  Procedures:  Antimicrobials:   Data Reviewed: I have personally reviewed following labs and imaging studies  CBC: Recent Labs  Lab 11/03/20 1424 11/03/20 1500 11/06/20 1157  WBC  --  6.8 6.3  NEUTROABS  --  4.9  --   HGB 11.6* 10.1* 8.0*  HCT 34.0* 31.8* 24.2*  MCV  --  98.1 96.0  PLT  --  220 947   Basic Metabolic Panel: Recent Labs   Lab 11/03/20 1525 11/05/20 0110 11/06/20 0130 11/07/20 0139 11/08/20 0143  NA 136 135 131* 134* 135  K 4.8 4.0 4.5 3.6 3.9  CL 96* 93* 91* 95* 96*  CO2 27 27 27 27 27   GLUCOSE 106* 121* 121* 124* 147*  BUN 41* 30* 36* 24* 34*  CREATININE 5.69* 4.50* 5.48* 3.95* 4.65*  CALCIUM 8.2* 8.1* 8.3* 8.3* 8.5*  PHOS  --  4.6 5.5* 4.0 4.1   GFR: CrCl cannot be calculated (Unknown ideal weight.). Liver Function Tests: Recent Labs  Lab 11/03/20 1525 11/05/20 0110 11/06/20 0130 11/07/20 0139 11/08/20 0143  AST 43*  --   --   --   --   ALT 44  --   --   --   --   ALKPHOS 202*  --   --   --   --   BILITOT 1.0  --   --   --   --   PROT 6.2*  --   --   --   --   ALBUMIN 2.7* 2.4* 2.3* 2.7* 2.5*   No results for input(s): LIPASE, AMYLASE in the last 168 hours. No results for input(s): AMMONIA  in the last 168 hours. Coagulation Profile: No results for input(s): INR, PROTIME in the last 168 hours. Cardiac Enzymes: No results for input(s): CKTOTAL, CKMB, CKMBINDEX, TROPONINI in the last 168 hours. BNP (last 3 results) No results for input(s): PROBNP in the last 8760 hours. HbA1C: No results for input(s): HGBA1C in the last 72 hours. CBG: Recent Labs  Lab 11/03/20 1347  GLUCAP 119*   Lipid Profile: No results for input(s): CHOL, HDL, LDLCALC, TRIG, CHOLHDL, LDLDIRECT in the last 72 hours. Thyroid Function Tests: No results for input(s): TSH, T4TOTAL, FREET4, T3FREE, THYROIDAB in the last 72 hours. Anemia Panel: No results for input(s): VITAMINB12, FOLATE, FERRITIN, TIBC, IRON, RETICCTPCT in the last 72 hours. Sepsis Labs: Recent Labs  Lab 11/03/20 1500  LATICACIDVEN 2.2*    Recent Results (from the past 240 hour(s))  Resp Panel by RT-PCR (Flu A&B, Covid) Nasopharyngeal Swab     Status: None   Collection Time: 11/03/20  3:24 PM   Specimen: Nasopharyngeal Swab; Nasopharyngeal(NP) swabs in vial transport medium  Result Value Ref Range Status   SARS Coronavirus 2 by RT PCR  NEGATIVE NEGATIVE Final    Comment: (NOTE) SARS-CoV-2 target nucleic acids are NOT DETECTED.  The SARS-CoV-2 RNA is generally detectable in upper respiratory specimens during the acute phase of infection. The lowest concentration of SARS-CoV-2 viral copies this assay can detect is 138 copies/mL. A negative result does not preclude SARS-Cov-2 infection and should not be used as the sole basis for treatment or other patient management decisions. A negative result may occur with  improper specimen collection/handling, submission of specimen other than nasopharyngeal swab, presence of viral mutation(s) within the areas targeted by this assay, and inadequate number of viral copies(<138 copies/mL). A negative result must be combined with clinical observations, patient history, and epidemiological information. The expected result is Negative.  Fact Sheet for Patients:  EntrepreneurPulse.com.au  Fact Sheet for Healthcare Providers:  IncredibleEmployment.be  This test is no t yet approved or cleared by the [place] FDA and  has been authorized for detection and/or diagnosis of SARS-CoV-2 by FDA under an Emergency Use Authorization (EUA). This EUA will remain  in effect (meaning this test can be used) for the duration of the COVID-19 declaration under Section 564(b)(1) of the Act, 21 U.S.C.section 360bbb-3(b)(1), unless the authorization is terminated  or revoked sooner.       Influenza A by PCR NEGATIVE NEGATIVE Final   Influenza B by PCR NEGATIVE NEGATIVE Final    Comment: (NOTE) The Xpert Xpress SARS-CoV-2/FLU/RSV plus assay is intended as an aid in the diagnosis of influenza from Nasopharyngeal swab specimens and should not be used as a sole basis for treatment. Nasal washings and aspirates are unacceptable for Xpert Xpress SARS-CoV-2/FLU/RSV testing.  Fact Sheet for Patients: EntrepreneurPulse.com.au  Fact Sheet for  Healthcare Providers: IncredibleEmployment.be  This test is not yet approved or cleared by the [place] FDA and has been authorized for detection and/or diagnosis of SARS-CoV-2 by FDA under an Emergency Use Authorization (EUA). This EUA will remain in effect (meaning this test can be used) for the duration of the COVID-19 declaration under Section 564(b)(1) of the Act, 21 U.S.C. section 360bbb-3(b)(1), unless the authorization is terminated or revoked.  Performed at [facility], [place] [address] [place], [place] [address]      Radiology Studies: No results found.  Scheduled Meds:  aspirin  325 mg Oral Daily   atorvastatin  80 mg Oral QHS   Chlorhexidine Gluconate  Cloth  6 each Topical Q0600   clopidogrel  75 mg Oral Daily   darbepoetin (ARANESP) injection - DIALYSIS  150 mcg Intravenous Q Wed-HD   docusate sodium  100 mg Oral Daily   feeding supplement (NEPRO CARB STEADY)  237 mL Oral BID BM   gabapentin  100 mg Oral Daily   And   gabapentin  200 mg Oral QHS   levETIRAcetam  500 mg Oral BID   lidocaine  1 application Topical Daily   midodrine  15 mg Oral TID WC   pantoprazole  20 mg Oral Daily   Continuous Infusions:   LOS: 5 days   Time spent: 40 minutes. More than 50% of the time was spent in counseling/coordination of care  Lorella Nimrod, MD [facility]  If 7PM-7AM, please contact night-coverage [email or web address]  11/08/2020, 2:12 PM   This record has been created using Systems analyst. Errors have been sought and corrected,but may not always be located. Such creation errors do not reflect on the standard of care.

## 2020-11-09 ENCOUNTER — Inpatient Hospital Stay (HOSPITAL_COMMUNITY): Payer: Medicare Other

## 2020-11-09 DIAGNOSIS — I959 Hypotension, unspecified: Secondary | ICD-10-CM | POA: Diagnosis not present

## 2020-11-09 HISTORY — PX: IR THORACENTESIS ASP PLEURAL SPACE W/IMG GUIDE: IMG5380

## 2020-11-09 LAB — GLUCOSE, PLEURAL OR PERITONEAL FLUID: Glucose, Fluid: 102 mg/dL

## 2020-11-09 LAB — BODY FLUID CELL COUNT WITH DIFFERENTIAL
Eos, Fluid: 0 %
Lymphs, Fluid: 14 %
Monocyte-Macrophage-Serous Fluid: 19 % — ABNORMAL LOW (ref 50–90)
Neutrophil Count, Fluid: 67 % — ABNORMAL HIGH (ref 0–25)
Total Nucleated Cell Count, Fluid: 1400 cu mm — ABNORMAL HIGH (ref 0–1000)

## 2020-11-09 LAB — RENAL FUNCTION PANEL
Albumin: 2.6 g/dL — ABNORMAL LOW (ref 3.5–5.0)
Anion gap: 11 (ref 5–15)
BUN: 25 mg/dL — ABNORMAL HIGH (ref 8–23)
CO2: 25 mmol/L (ref 22–32)
Calcium: 8.7 mg/dL — ABNORMAL LOW (ref 8.9–10.3)
Chloride: 98 mmol/L (ref 98–111)
Creatinine, Ser: 3.66 mg/dL — ABNORMAL HIGH (ref 0.61–1.24)
GFR, Estimated: 17 mL/min — ABNORMAL LOW (ref >60)
Glucose, Bld: 138 mg/dL — ABNORMAL HIGH (ref 70–99)
Phosphorus: 3.5 mg/dL (ref 2.5–4.6)
Potassium: 3.9 mmol/L (ref 3.5–5.1)
Sodium: 134 mmol/L — ABNORMAL LOW (ref 135–145)

## 2020-11-09 LAB — PROTEIN, PLEURAL OR PERITONEAL FLUID: Total protein, fluid: <3 g/dL

## 2020-11-09 LAB — LACTATE DEHYDROGENASE, PLEURAL OR PERITONEAL FLUID: LD, Fluid: 222 U/L — ABNORMAL HIGH (ref 3–23)

## 2020-11-09 MED ORDER — CHLORHEXIDINE GLUCONATE CLOTH 2 % EX PADS: 6.0000 | MEDICATED_PAD | Freq: Every day | CUTANEOUS | Status: DC

## 2020-11-09 MED ORDER — LIDOCAINE HCL 1 % IJ SOLN
INTRAMUSCULAR | Status: AC
  Administered 2020-11-09: 10 mL via INTRADERMAL
  Filled 2020-11-09: qty 20

## 2020-11-09 NOTE — Progress Notes (Addendum)
Palliative-   Chart checked- noted that patient had hypotension with dialysis. Plan for another attempt on Friday. Family continues to request full scope.  Palliative will continue to follow.   Mariana Kaufman, AGNP-C Palliative Medicine  Please call Palliative Medicine team phone with any questions [phone]. For individual providers please see AMION.  No charge

## 2020-11-09 NOTE — Progress Notes (Addendum)
PROGRESS NOTE    George Burgess  ZOX:[id number] DOB: [birth year] DOA: 11/03/2020 PCP: Caprice Renshaw, MD   Brief Narrative: Taken from prior notes. 70 year old man complicated PMH including ESRD, chronic hypotension, chronic combined CHF, cirrhosis, chronic hypoxic respiratory failure, metastatic prostate cancer, presenting with shortness of breath.  Admitted for acute on chronic hypoxic respiratory failure, initially thought secondary to pleural effusion, however patient remains at baseline oxygen requirement despite presence of effusion.  Condition complicated by recently missed dialysis session.  He appears clinically stable with chronic hypotension, however he has not successfully dialyzed during hospitalization, no volume was able to be removed 9/19.  Patient had another dialysis today-blood pressure becoming soft even with albumin.  Unable to do much volume removal due to softer blood pressure. Thoracentesis was ordered. Palliative care was consulted-patient and family decided to remain full code with full scope of medical care.  If continue not to tolerate dialysis then palliative care needs to revisit and go to hospice home.  Unable to complete dialysis due to softer blood pressure.  Family still wants full scope of care.  Unable to remove extra fluid.  Thoracentesis ordered but doubt that will help. Patient is very high risk for deterioration and death-appropriate for comfort measures only.  Subjective: Patient was seen and examined today.  Resting comfortably.  Denies any complaints.  Agreeable to go for thoracentesis today.  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  Blood pressure remained soft during dialysis despite giving albumin.  Unable to remove extra fluid.  He is not a candidate for CRRT per nephrology. -Continue with midodrine -Might not be able to tolerate dialysis as unable to remove fluid. -Currently wants to remain full code with full scope of care. -Patient is appropriate for comfort measures only but family does not agree with that.  Recurrent left pleural effusion.  Currently stable on home oxygen requirement of 2 L. -Left-sided thoracentesis ordered as unable to remove much fluid with dialysis.  Cirrhosis of liver with ascites. -Continue to monitor  Type 2 diabetes mellitus with chronic kidney disease on chronic dialysis (HCC) --stable w/ last A1c 6.6. --monitor   Chronic combined systolic (congestive) and diastolic (congestive) heart failure (HCC) --appears stable, continue to hold Toprol given BP; not on diuretic (ESRD) --Last EF noted to be around 20-25% with left ventricular global hypokinesis and moderate pulmonary hypertension at [facility] in 06/2020.   ESRD on hemodialysis [facility]) --nephrology consultation, in past noted to not be a candidate for CRRT. Previous HD limited by chronic hypotension.   Chronic respiratory failure with hypoxia (HCC) --stable. Continue supplemental oxygen.   Dementia ([place]) --appears stable. Not on any outpt meds. Delirium precautions.   Atrial fibrillation, chronic --stable. Toprol on hold for low BP.  Felt to be a poor candidate for anticoagulation  by cardiology in the past.   Seizure ([place]) --stable, continue Keppra, gabapentin   Prostate cancer,  primary, with metastasis from prostate to other site [facility]) --followed by urology, treated w/ [facility].   CAD (coronary artery disease) --asymptomatic, continue aspirin, statin   PVD (peripheral vascular disease) (HCC) --s/p BKA. Stable.    Anemia due to chronic kidney disease --stable -Continue with Aranesp    Objective: Vitals:   11/09/20 0921 11/09/20 1001 11/09/20 1126 11/09/20 1400  BP: (!) 91/59 90/60 (!) 89/45 (!) 84/59  Pulse: 100 93 95 94  Resp: 20 19 20    Temp: (!) 95.4 F (35.2 C) 97.6 F (36.4 C) 98 F (36.7 C)   TempSrc: Oral Oral Oral   SpO2:  98% 96%   Weight:        Intake/Output Summary (Last 24 hours) at 11/09/2020 1535 Last data filed at 11/08/2020 2144 Gross per 24 hour  Intake 120 ml  Output --  Net 120 ml    Filed Weights   11/08/20 0320 11/08/20 0821 11/09/20 0334  Weight: 88.3 kg 85.2 kg 85.6 kg    Examination:  General.  Lethargic gentleman, in no acute distress. Pulmonary.  Lungs clear bilaterally, normal respiratory effort.  Decreased breath sounds at bases CV.  Regular rate and rhythm, no JVD, rub or murmur. Abdomen.  Soft, nontender, nondistended, BS positive. CNS.  Alert and oriented .  No focal neurologic deficit. Extremities.  Left BKA, right 1+ LE edema Psychiatry.  Judgment and insight appears normal.   DVT prophylaxis:  Code Status: Full Family Communication: Discussed with patient, called brother with no response, later he called back.  We again discussed his situation specially the [place], per brother patient and his family is very Administrator, arts to continue with full scope of care and if he does not make it he will not make it but they are not ready to withdraw any treatment at this time. Disposition Plan:  Status is: Inpatient  Remains inpatient appropriate because:Inpatient level of care appropriate due to severity of illness  Dispo: The patient is from: Home              Anticipated d/c is to:  To be determined               Patient currently is not medically stable to d/c.   Difficult to place patient No               Level of care: Progressive  All the records are reviewed and case discussed with Care Management/Social Worker. Management plans discussed with the patient, nursing and they are in agreement.  Consultants:  Nephrology  Procedures:  Antimicrobials:   Data Reviewed: I have personally reviewed following labs and imaging studies  CBC: Recent Labs  Lab 11/03/20 1424 11/03/20 1500 11/06/20 1157  WBC  --  6.8 6.3  NEUTROABS  --  4.9  --   HGB 11.6* 10.1* 8.0*  HCT 34.0* 31.8* 24.2*  MCV  --  98.1 96.0  PLT  --  220 376    Basic Metabolic Panel: Recent Labs  Lab 11/05/20 0110 11/06/20 0130 11/07/20 0139 11/08/20 0143 11/09/20 0130  NA 135 131* 134* 135 134*  K 4.0 4.5 3.6 3.9 3.9  CL 93* 91* 95* 96* 98  CO2 27 27 27 27 25   GLUCOSE 121* 121* 124* 147* 138*  BUN 30* 36* 24* 34* 25*  CREATININE 4.50* 5.48* 3.95* 4.65* 3.66*  CALCIUM 8.1* 8.3* 8.3* 8.5*  8.7*  PHOS 4.6 5.5* 4.0 4.1 3.5    GFR: CrCl cannot be calculated (Unknown ideal weight.). Liver Function Tests: Recent Labs  Lab 11/03/20 1525 11/05/20 0110 11/06/20 0130 11/07/20 0139 11/08/20 0143 11/09/20 0130  AST 43*  --   --   --   --   --   ALT 44  --   --   --   --   --   ALKPHOS 202*  --   --   --   --   --   BILITOT 1.0  --   --   --   --   --   PROT 6.2*  --   --   --   --   --   ALBUMIN 2.7* 2.4* 2.3* 2.7* 2.5* 2.6*    No results for input(s): LIPASE, AMYLASE in the last 168 hours. No results for input(s): AMMONIA in the last 168 hours. Coagulation Profile: No results for input(s): INR, PROTIME in the last 168 hours. Cardiac Enzymes: No results for input(s): CKTOTAL, CKMB, CKMBINDEX, TROPONINI in the last 168 hours. BNP (last 3 results) No results for input(s): PROBNP in the last 8760 hours. HbA1C: No results for input(s): HGBA1C in the last 72 hours. CBG: Recent Labs  Lab 11/03/20 1347   GLUCAP 119*    Lipid Profile: No results for input(s): CHOL, HDL, LDLCALC, TRIG, CHOLHDL, LDLDIRECT in the last 72 hours. Thyroid Function Tests: No results for input(s): TSH, T4TOTAL, FREET4, T3FREE, THYROIDAB in the last 72 hours. Anemia Panel: No results for input(s): VITAMINB12, FOLATE, FERRITIN, TIBC, IRON, RETICCTPCT in the last 72 hours. Sepsis Labs: Recent Labs  Lab 11/03/20 1500  LATICACIDVEN 2.2*     Recent Results (from the past 240 hour(s))  Resp Panel by RT-PCR (Flu A&B, Covid) Nasopharyngeal Swab     Status: None   Collection Time: 11/03/20  3:24 PM   Specimen: Nasopharyngeal Swab; Nasopharyngeal(NP) swabs in vial transport medium  Result Value Ref Range Status   SARS Coronavirus 2 by RT PCR NEGATIVE NEGATIVE Final    Comment: (NOTE) SARS-CoV-2 target nucleic acids are NOT DETECTED.  The SARS-CoV-2 RNA is generally detectable in upper respiratory specimens during the acute phase of infection. The lowest concentration of SARS-CoV-2 viral copies this assay can detect is 138 copies/mL. A negative result does not preclude SARS-Cov-2 infection and should not be used as the sole basis for treatment or other patient management decisions. A negative result may occur with  improper specimen collection/handling, submission of specimen other than nasopharyngeal swab, presence of viral mutation(s) within the areas targeted by this assay, and inadequate number of viral copies(<138 copies/mL). A negative result must be combined with clinical observations, patient history, and epidemiological information. The expected result is Negative.  Fact Sheet for Patients:  EntrepreneurPulse.com.au  Fact Sheet for Healthcare Providers:  IncredibleEmployment.be  This test is no t yet approved or cleared by the [place] FDA and  has been authorized for detection and/or diagnosis of SARS-CoV-2 by FDA under an Emergency Use Authorization  (EUA). This EUA will remain  in effect (meaning this test can be used) for the duration of the COVID-19 declaration under Section 564(b)(1) of the Act, 21 U.S.C.section 360bbb-3(b)(1), unless the authorization is terminated  or revoked sooner.       Influenza A by PCR NEGATIVE NEGATIVE Final   Influenza B by PCR NEGATIVE NEGATIVE Final    Comment: (NOTE) The Xpert Xpress SARS-CoV-2/FLU/RSV plus assay is intended as an aid in  the diagnosis of influenza from Nasopharyngeal swab specimens and should not be used as a sole basis for treatment. Nasal washings and aspirates are unacceptable for Xpert Xpress SARS-CoV-2/FLU/RSV testing.  Fact Sheet for Patients: EntrepreneurPulse.com.au  Fact Sheet for Healthcare Providers: IncredibleEmployment.be  This test is not yet approved or cleared by the [place] FDA and has been authorized for detection and/or diagnosis of SARS-CoV-2 by FDA under an Emergency Use Authorization (EUA). This EUA will remain in effect (meaning this test can be used) for the duration of the COVID-19 declaration under Section 564(b)(1) of the Act, 21 U.S.C. section 360bbb-3(b)(1), unless the authorization is terminated or revoked.  Performed at [facility], [place] [address], [place], [place] [address]       Radiology Studies: No results found.  Scheduled Meds:  lidocaine       aspirin  325 mg Oral Daily   atorvastatin  80 mg Oral QHS   Chlorhexidine Gluconate Cloth  6 each Topical Q0600   clopidogrel  75 mg Oral Daily   darbepoetin (ARANESP) injection - DIALYSIS  150 mcg Intravenous Q Wed-HD   docusate sodium  100 mg Oral Daily   feeding supplement (NEPRO CARB STEADY)  237 mL Oral BID BM   gabapentin  100 mg Oral Daily   And   gabapentin  200 mg Oral QHS   levETIRAcetam  500 mg Oral BID   lidocaine  1 application Topical Daily   midodrine  15 mg Oral TID WC   pantoprazole  20 mg Oral Daily   Continuous  Infusions:   LOS: 6 days   Time spent: 38 minutes. More than 50% of the time was spent in counseling/coordination of care  Lorella Nimrod, MD [facility]  If 7PM-7AM, please contact night-coverage [email or web address]  11/09/2020, 3:35 PM   This record has been created using Systems analyst. Errors have been sought and corrected,but may not always be located. Such creation errors do not reflect on the standard of care.

## 2020-11-09 NOTE — Progress Notes (Signed)
[facility] ROUNDING NOTE   Subjective:   Interval History: 70 year old gentleman with history end-stage renal disease chronic hypotension combined congestive heart failure.  History of cirrhosis hypoxic respiratory failure metastatic prostate cancer.  He received his regular [facility] treatment at [facility] [place].  He was admitted with hypotension and dyspnea.  Blood pressure 91/59 pulse 100 temperature 95.4 O2 sats 96% room air last dialysis was 11/08/2020 unfortunately dialysis was limited by hypotension.  Unable to remove fluid.  Palliative medicine assisting.     Sodium 134 potassium 3.9 chloride 98 CO2 25 BUN 25 creatinine 3.66 glucose 138 calcium 8.7 phosphorus 3.5 hemoglobin 8  Objective:  Vital signs in last 24 hours:  Temp:  [95.4 F (35.2 C)-98.3 F (36.8 C)] 95.4 F (35.2 C) (09/22 0921) Pulse Rate:  [56-100] 100 (09/22 0921) Resp:  [17-24] 20 (09/22 0921) BP: (84-107)/(49-77) 91/59 (09/22 0921) SpO2:  [90 %-99 %] 96 % (09/22 0500) Weight:  [85.6 kg] 85.6 kg (09/22 0334)  Weight change: -3.1 kg Filed Weights   11/08/20 0320 11/08/20 0821 11/09/20 0334  Weight: 88.3 kg 85.2 kg 85.6 kg    Intake/Output: I/O last 3 completed shifts: In: 580 [P.O.:580] Out: 1490 [Other:1490]   Intake/Output this shift:  No intake/output data recorded. KYH:CWCBJSEGBTD ill appearing, laying flat in bed CVS:rrr Resp:decreased air entry left fields, normal WOB, on 2LNC VVO:HYWVPXTGG, soft, nt YIR:SWNI bka, very trace edema rt LE Neuro: awake HD access: RIJ TDC, LUE    Basic Metabolic Panel: Recent Labs  Lab 11/05/20 0110 11/06/20 0130 11/07/20 0139 11/08/20 0143 11/09/20 0130  NA 135 131* 134* 135 134*  K 4.0 4.5 3.6 3.9 3.9  CL 93* 91* 95* 96* 98  CO2 27 27 27 27 25   GLUCOSE 121* 121* 124* 147* 138*  BUN 30* 36* 24* 34* 25*  CREATININE 4.50* 5.48* 3.95* 4.65* 3.66*  CALCIUM 8.1* 8.3* 8.3* 8.5* 8.7*  PHOS 4.6 5.5* 4.0 4.1 3.5      Liver Function Tests: Recent Labs  Lab 11/03/20 1525 11/05/20 0110 11/06/20 0130 11/07/20 0139 11/08/20 0143 11/09/20 0130  AST 43*  --   --   --   --   --   ALT 44  --   --   --   --   --   ALKPHOS 202*  --   --   --   --   --   BILITOT 1.0  --   --   --   --   --   PROT 6.2*  --   --   --   --   --   ALBUMIN 2.7* 2.4* 2.3* 2.7* 2.5* 2.6*    No results for input(s): LIPASE, AMYLASE in the last 168 hours. No results for input(s): AMMONIA in the last 168 hours.  CBC: Recent Labs  Lab 11/03/20 1424 11/03/20 1500 11/06/20 1157  WBC  --  6.8 6.3  NEUTROABS  --  4.9  --   HGB 11.6* 10.1* 8.0*  HCT 34.0* 31.8* 24.2*  MCV  --  98.1 96.0  PLT  --  220 201     Cardiac Enzymes: No results for input(s): CKTOTAL, CKMB, CKMBINDEX, TROPONINI in the last 168 hours.  BNP: Invalid input(s): POCBNP  CBG: Recent Labs  Lab 11/03/20 [place]*     Microbiology: Results for orders placed or performed during the hospital encounter of 11/03/20  Resp Panel by RT-PCR (Flu A&B, Covid) Nasopharyngeal Swab  Status: None   Collection Time: 11/03/20  3:24 PM   Specimen: Nasopharyngeal Swab; Nasopharyngeal(NP) swabs in vial transport medium  Result Value Ref Range Status   SARS Coronavirus 2 by RT PCR NEGATIVE NEGATIVE Final    Comment: (NOTE) SARS-CoV-2 target nucleic acids are NOT DETECTED.  The SARS-CoV-2 RNA is generally detectable in upper respiratory specimens during the acute phase of infection. The lowest concentration of SARS-CoV-2 viral copies this assay can detect is 138 copies/mL. A negative result does not preclude SARS-Cov-2 infection and should not be used as the sole basis for treatment or other patient management decisions. A negative result may occur with  improper specimen collection/handling, submission of specimen other than nasopharyngeal swab, presence of viral mutation(s) within the areas targeted by this assay, and inadequate number of  viral copies(<138 copies/mL). A negative result must be combined with clinical observations, patient history, and epidemiological information. The expected result is Negative.  Fact Sheet for Patients:  EntrepreneurPulse.com.au  Fact Sheet for Healthcare Providers:  IncredibleEmployment.be  This test is no t yet approved or cleared by the [place] FDA and  has been authorized for detection and/or diagnosis of SARS-CoV-2 by FDA under an Emergency Use Authorization (EUA). This EUA will remain  in effect (meaning this test can be used) for the duration of the COVID-19 declaration under Section 564(b)(1) of the Act, 21 U.S.C.section 360bbb-3(b)(1), unless the authorization is terminated  or revoked sooner.       Influenza A by PCR NEGATIVE NEGATIVE Final   Influenza B by PCR NEGATIVE NEGATIVE Final    Comment: (NOTE) The Xpert Xpress SARS-CoV-2/FLU/RSV plus assay is intended as an aid in the diagnosis of influenza from Nasopharyngeal swab specimens and should not be used as a sole basis for treatment. Nasal washings and aspirates are unacceptable for Xpert Xpress SARS-CoV-2/FLU/RSV testing.  Fact Sheet for Patients: EntrepreneurPulse.com.au  Fact Sheet for Healthcare Providers: IncredibleEmployment.be  This test is not yet approved or cleared by the [place] FDA and has been authorized for detection and/or diagnosis of SARS-CoV-2 by FDA under an Emergency Use Authorization (EUA). This EUA will remain in effect (meaning this test can be used) for the duration of the COVID-19 declaration under Section 564(b)(1) of the Act, 21 U.S.C. section 360bbb-3(b)(1), unless the authorization is terminated or revoked.  Performed at [facility], [place] [address] [place], [place] [address]     Coagulation Studies: No results for input(s): LABPROT, INR in the last 72 hours.  Urinalysis: No results  for input(s): COLORURINE, LABSPEC, PHURINE, GLUCOSEU, HGBUR, BILIRUBINUR, KETONESUR, PROTEINUR, UROBILINOGEN, NITRITE, LEUKOCYTESUR in the last 72 hours.  Invalid input(s): APPERANCEUR    Imaging: No results found.   Medications:      aspirin  325 mg Oral Daily   atorvastatin  80 mg Oral QHS   Chlorhexidine Gluconate Cloth  6 each Topical Q0600   clopidogrel  75 mg Oral Daily   darbepoetin (ARANESP) injection - DIALYSIS  150 mcg Intravenous Q Wed-HD   docusate sodium  100 mg Oral Daily   feeding supplement (NEPRO CARB STEADY)  237 mL Oral BID BM   gabapentin  100 mg Oral Daily   And   gabapentin  200 mg Oral QHS   levETIRAcetam  500 mg Oral BID   lidocaine  1 application Topical Daily   midodrine  15 mg Oral TID WC   pantoprazole  20 mg Oral Daily   acetaminophen **OR** acetaminophen, albuterol, ondansetron **OR** ondansetron (ZOFRAN) IV  Assessment/ Plan:  # ESRD:  -outpatient orders: [facility], MWF, EDW 75 kg, left upper extremity AVG (also has TDC), 4 hours, 400/600, 3K, 2.5 calcium, heparin 3800unit bolus (no maintenance)  Next dialysis treatment will be 11/10/2020   #Pleural effusion, left -recommend thora;per primary, HD will likely not be able to fix this   #Cirrhosis w/ ascites -recommend para, per primary   # Volume/ chronic hypotension: EDW 75kg.  Ultrafiltration limited by hypotension.  Currently receiving 15 mg of midodrine 3 times daily   # Anemia of Chronic Kidney Disease: Drop in hemoglobin 8.0 we will add ESA   # Secondary Hyperparathyroidism/Hyperphosphatemia: monitor phos    # Vascular access: has LUE AVG that is being cannulated, if here over the weekend, then can arrange for [facility] to be removed w/ IR    LOS: [place] @TODAY @9 :51 AM

## 2020-11-09 NOTE — Procedures (Addendum)
PROCEDURE SUMMARY:  Successful US guided left thoracentesis. Yielded 1.3 L of serosanguinous fluid. Procedure was stopped d/t pt persistent coughing.  He denied pain and was in no distress.  No immediate complications.  Specimen was sent for labs. CXR ordered.  EBL < 1 mL  Tyson Alias, NP 11/09/2020 4:00 PM

## 2020-11-10 ENCOUNTER — Inpatient Hospital Stay (HOSPITAL_COMMUNITY): Payer: Medicare Other

## 2020-11-10 DIAGNOSIS — Z515 Encounter for palliative care: Secondary | ICD-10-CM | POA: Diagnosis not present

## 2020-11-10 DIAGNOSIS — I959 Hypotension, unspecified: Secondary | ICD-10-CM | POA: Diagnosis not present

## 2020-11-10 DIAGNOSIS — Z7189 Other specified counseling: Secondary | ICD-10-CM | POA: Diagnosis not present

## 2020-11-10 LAB — RENAL FUNCTION PANEL
Albumin: 2.4 g/dL — ABNORMAL LOW (ref 3.5–5.0)
Anion gap: 10 (ref 5–15)
BUN: 32 mg/dL — ABNORMAL HIGH (ref 8–23)
CO2: 28 mmol/L (ref 22–32)
Calcium: 8.5 mg/dL — ABNORMAL LOW (ref 8.9–10.3)
Chloride: 95 mmol/L — ABNORMAL LOW (ref 98–111)
Creatinine, Ser: 4.42 mg/dL — ABNORMAL HIGH (ref 0.61–1.24)
GFR, Estimated: 14 mL/min — ABNORMAL LOW (ref >60)
Glucose, Bld: 138 mg/dL — ABNORMAL HIGH (ref 70–99)
Phosphorus: 4 mg/dL (ref 2.5–4.6)
Potassium: 4 mmol/L (ref 3.5–5.1)
Sodium: 133 mmol/L — ABNORMAL LOW (ref 135–145)

## 2020-11-10 LAB — CYTOLOGY - NON PAP

## 2020-11-10 MED ORDER — IOHEXOL 300 MG/ML  SOLN
75.0000 mL | Freq: Once | INTRAMUSCULAR | Status: AC | PRN
  Administered 2020-11-10: 75 mL via INTRAVENOUS

## 2020-11-10 NOTE — Progress Notes (Signed)
PROGRESS NOTE    George Burgess  IRC:[id number] DOB: December 19, 1950 DOA: 11/03/2020 PCP: Caprice Renshaw, MD   Brief Narrative: Taken from prior notes. 70 year old man complicated PMH including ESRD, chronic hypotension, chronic combined CHF, cirrhosis, chronic hypoxic respiratory failure, metastatic prostate cancer, presenting with shortness of breath.  Admitted for acute on chronic hypoxic respiratory failure, initially thought secondary to pleural effusion, however patient remains at baseline oxygen requirement despite presence of effusion.  Condition complicated by recently missed dialysis session.  He appears clinically stable with chronic hypotension, however he has not successfully dialyzed during hospitalization, no volume was able to be removed 9/19.  Patient had another dialysis today-blood pressure becoming soft even with albumin.  Unable to do much volume removal due to softer blood pressure. Thoracentesis was ordered. Palliative care was consulted-patient and family decided to remain full code with full scope of medical care.  If continue not to tolerate dialysis then palliative care needs to revisit and go to hospice home.  Unable to complete dialysis due to softer blood pressure.  Family still wants full scope of care.  Unable to remove extra fluid.  Thoracentesis ordered but doubt that will help. Patient is very high risk for deterioration and death-appropriate for comfort measures only.  9/23: Left thoracentesis with removal of 1.2 L of fluid, unable to take more fluid off as patient started coughing and becoming uncomfortable.  Initial labs seems exudative but cultures so far negative. CT chest was ordered because of his history of metastatic prostate cancer, that did not show lung lesions but did show worsening of osseous metastasis and liver nodularity.  Large volume ascites.  Subjective: Patient seems very lethargic and keep repeating sentences.  He appears little more  confused. Denies any pain.  Wants to continue medical management, still does not want to give up.  Assessment & Plan:   Principal Problem:   Hypotension Active Problems:   Seizure ([place])   ESRD on hemodialysis (HCC)   Atrial fibrillation, chronic   Anemia due to chronic kidney disease   Dementia (HCC)   Chronic combined systolic (congestive) and diastolic (congestive) heart failure (HCC)   PVD (peripheral vascular disease) (HCC)   CAD (coronary artery disease)   Prostate cancer, primary, with metastasis from prostate to other site [facility])   Type 2 diabetes mellitus with chronic kidney disease on chronic dialysis ([place])   Cirrhosis of liver with ascites ([place])   Pleural effusion on left   Pressure injury of skin   Chronic respiratory failure with hypoxia (HCC)   Hx of BKA (HCC)  Hypotension.  Blood pressure remained soft during dialysis despite giving albumin.  Unable to remove extra fluid.  He is not a candidate for CRRT per nephrology. -Continue with midodrine -Might not be able to tolerate dialysis as unable to remove fluid. -Currently wants to remain full code with full scope of care. -Patient is appropriate for comfort measures only but family does not agree with that. -Appreciate palliative care help-a family meeting with nephrology might be helpful.  Going for another dialysis today-doubt he will tolerate.  Recurrent left pleural effusion.  Currently stable on home oxygen requirement of 2 L. -Left-sided thoracentesis-removal of 1.2 L of exudative fluid, preliminary cultures negative.  Unable to tolerate more removal, postprocedural chest x-ray with significant remaining pleural effusion. CT chest with large bilateral pleural effusions, with basal atelectasis, large volume ascites and liver nodularity.  No pulmonary nodule noted but there is an extension of osseous metastatic disease. Seems more consistent  with heart failure and ESRD as we are unable to remove fluid due to softer  blood pressure.  Cirrhosis of liver with ascites.  Imaging with anasarca and large volume ascites. -Continue to monitor -Can try paracentesis-just for some comfort.  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 --monitor   Chronic combined systolic (congestive) and diastolic (congestive) heart failure (HCC)     -Worsening anasarca and fluid retention as unable to remove fluid with dialysis due to hypotension. -- continue to hold Toprol given BP; not on diuretic (ESRD) --Last EF noted to be around 20-25% with left ventricular global hypokinesis and moderate pulmonary hypertension at [facility] in 06/2020.   ESRD on hemodialysis [facility]) --nephrology consultation, in past noted to not be a candidate for CRRT. Previous HD limited by chronic hypotension. -Patient desired to continue despite worsening volume status and unable to tolerate dialysis.   Chronic respiratory failure with hypoxia (HCC) --stable. Continue supplemental oxygen.   Dementia ([place]) --appears stable. Not on any outpt meds. Delirium precautions.   Atrial fibrillation, chronic --stable. Toprol on hold for low BP.  Felt to be a poor candidate for anticoagulation by cardiology in the past.   Seizure ([place]) --stable, continue Keppra, gabapentin   Prostate cancer, primary, with metastasis from prostate to other site [facility]) --followed by urology, treated w/ [facility] Koller. -Repeat imaging with worsening of metastatic disease.   CAD (coronary artery disease) --asymptomatic, continue aspirin, statin   PVD (peripheral vascular disease) (HCC) --s/p BKA. Stable.    Anemia due to chronic kidney disease --stable -Continue with Aranesp    Objective: Vitals:   11/09/20 2307 11/10/20 0344 11/10/20 0800 11/10/20 1200  BP: (!) 88/66 (!) 80/53 (!) 86/60 (!) 82/58  Pulse: 80 80 94 87  Resp: 18 19 18 15   Temp: 97.7 F (36.5 C) 97.7 F (36.5 C) 97.8 F (36.6 C) 97.9 F  (36.6 C)  TempSrc: Oral Oral Oral Oral  SpO2: 90% 90% 95% 100%  Weight:  86.1 kg      Intake/Output Summary (Last 24 hours) at 11/10/2020 1548 Last data filed at 11/09/2020 2235 Gross per 24 hour  Intake 100 ml  Output --  Net 100 ml    Filed Weights   11/08/20 0821 11/09/20 0334 11/10/20 0344  Weight: 85.2 kg 85.6 kg 86.1 kg    Examination:  General.  Very lethargic gentleman, in no acute distress. Pulmonary.  Decreased breath sounds at bases, normal respiratory effort. CV.  Regular rate and rhythm, no JVD, rub or murmur. Abdomen.  Soft, nontender, distended with positive fluid wave. CNS.  Alert but appears little confused, keeps repeating sentences. Extremities.  1+ R LE edema, left BKA. Psychiatry.  Judgment and insight appears impaired.   DVT prophylaxis:  Code Status: Full Family Communication:  Disposition Plan:  Status is: Inpatient  Remains inpatient appropriate because:Inpatient level of care appropriate due to severity of illness  Dispo: The patient is from: Home              Anticipated d/c is to:  To be determined              Patient currently is not medically stable to d/c.   Difficult to place patient No               Level of care: Progressive  All the records are reviewed and case discussed with Care Management/Social Worker. Management plans discussed with the patient, nursing  and they are in agreement.  Consultants:  Nephrology  Procedures:  Antimicrobials:   Data Reviewed: I have personally reviewed following labs and imaging studies  CBC: Recent Labs  Lab 11/06/20 1157  WBC 6.3  HGB 8.0*  HCT 24.2*  MCV 96.0  PLT 147    Basic Metabolic Panel: Recent Labs  Lab 11/06/20 0130 11/07/20 0139 11/08/20 0143 11/09/20 0130 11/10/20 0109  NA 131* 134* 135 134* 133*  K 4.5 3.6 3.9 3.9 4.0  CL 91* 95* 96* 98 95*  CO2 27 27 27 25 28   GLUCOSE 121* 124* 147* 138* 138*  BUN 36* 24* 34* 25* 32*  CREATININE 5.48* 3.95* 4.65* 3.66* 4.42*   CALCIUM 8.3* 8.3* 8.5* 8.7* 8.5*  PHOS 5.5* 4.0 4.1 3.5 4.0    GFR: CrCl cannot be calculated (Unknown ideal weight.). Liver Function Tests: Recent Labs  Lab 11/06/20 0130 11/07/20 0139 11/08/20 0143 11/09/20 0130 11/10/20 0109  ALBUMIN 2.3* 2.7* 2.5* 2.6* 2.4*    No results for input(s): LIPASE, AMYLASE in the last 168 hours. No results for input(s): AMMONIA in the last 168 hours. Coagulation Profile: No results for input(s): INR, PROTIME in the last 168 hours. Cardiac Enzymes: No results for input(s): CKTOTAL, CKMB, CKMBINDEX, TROPONINI in the last 168 hours. BNP (last 3 results) No results for input(s): PROBNP in the last 8760 hours. HbA1C: No results for input(s): HGBA1C in the last 72 hours. CBG: No results for input(s): GLUCAP in the last 168 hours.  Lipid Profile: No results for input(s): CHOL, HDL, LDLCALC, TRIG, CHOLHDL, LDLDIRECT in the last 72 hours. Thyroid Function Tests: No results for input(s): TSH, T4TOTAL, FREET4, T3FREE, THYROIDAB in the last 72 hours. Anemia Panel: No results for input(s): VITAMINB12, FOLATE, FERRITIN, TIBC, IRON, RETICCTPCT in the last 72 hours. Sepsis Labs: No results for input(s): PROCALCITON, LATICACIDVEN in the last 168 hours.   Recent Results (from the past 240 hour(s))  Resp Panel by RT-PCR (Flu A&B, Covid) Nasopharyngeal Swab     Status: None   Collection Time: 11/03/20  3:24 PM   Specimen: Nasopharyngeal Swab; Nasopharyngeal(NP) swabs in vial transport medium  Result Value Ref Range Status   SARS Coronavirus 2 by RT PCR NEGATIVE NEGATIVE Final    Comment: (NOTE) SARS-CoV-2 target nucleic acids are NOT DETECTED.  The SARS-CoV-2 RNA is generally detectable in upper respiratory specimens during the acute phase of infection. The lowest concentration of SARS-CoV-2 viral copies this assay can detect is 138 copies/mL. A negative result does not preclude SARS-Cov-2 infection and should not be used as the sole basis for  treatment or other patient management decisions. A negative result may occur with  improper specimen collection/handling, submission of specimen other than nasopharyngeal swab, presence of viral mutation(s) within the areas targeted by this assay, and inadequate number of viral copies(<138 copies/mL). A negative result must be combined with clinical observations, patient history, and epidemiological information. The expected result is Negative.  Fact Sheet for Patients:  EntrepreneurPulse.com.au  Fact Sheet for Healthcare Providers:  IncredibleEmployment.be  This test is no t yet approved or cleared by the [place] FDA and  has been authorized for detection and/or diagnosis of SARS-CoV-2 by FDA under an Emergency Use Authorization (EUA). This EUA will remain  in effect (meaning this test can be used) for the duration of the COVID-19 declaration under Section 564(b)(1) of the Act, 21 U.S.C.section 360bbb-3(b)(1), unless the authorization is terminated  or revoked sooner.       Influenza A by  PCR NEGATIVE NEGATIVE Final   Influenza B by PCR NEGATIVE NEGATIVE Final    Comment: (NOTE) The Xpert Xpress SARS-CoV-2/FLU/RSV plus assay is intended as an aid in the diagnosis of influenza from Nasopharyngeal swab specimens and should not be used as a sole basis for treatment. Nasal washings and aspirates are unacceptable for Xpert Xpress SARS-CoV-2/FLU/RSV testing.  Fact Sheet for Patients: EntrepreneurPulse.com.au  Fact Sheet for Healthcare Providers: IncredibleEmployment.be  This test is not yet approved or cleared by the [place] FDA and has been authorized for detection and/or diagnosis of SARS-CoV-2 by FDA under an Emergency Use Authorization (EUA). This EUA will remain in effect (meaning this test can be used) for the duration of the COVID-19 declaration under Section 564(b)(1) of the Act, 21  U.S.C. section 360bbb-3(b)(1), unless the authorization is terminated or revoked.  Performed at [facility], [place] [address], [place], [place] [address]   Body fluid culture w Gram Stain     Status: None (Preliminary result)   Collection Time: 11/09/20  4:07 PM   Specimen: Lung, Left; Pleural Fluid  Result Value Ref Range Status   Specimen Description FLUID PLEURAL LEFT  Final   Special Requests NONE  Final   Gram Stain   Final    FEW WBC PRESENT, PREDOMINANTLY MONONUCLEAR NO ORGANISMS SEEN    Culture   Final    NO GROWTH < 24 HOURS Performed at [facility], [place] [address] [place], [place] [address]    Report Status PENDING  Incomplete      Radiology Studies: DG Chest 1 View  Result Date: 11/09/2020 CLINICAL DATA:  Status post left-sided thoracentesis. EXAM: CHEST  1 VIEW COMPARISON:  November 03, 2020 FINDINGS: There is stable right-sided venous catheter positioning. A radiopaque vascular stent is seen overlying the left apex and left axilla. Moderate to marked severity airspace disease is seen throughout the left lung. A large left-sided pleural effusion is also noted. No pneumothorax is identified. The cardiac silhouette is enlarged and unchanged in size. There is stable left to right deviation of the mediastinal structures. The visualized skeletal structures are unremarkable. IMPRESSION: 1. Moderate to marked severity airspace disease throughout the left lung with large left-sided pleural effusion. 2. No pneumothorax in a patient is status post thoracentesis. Electronically Signed   By: Virgina Norfolk M.D.   On: 11/09/2020 16:35   CT CHEST W CONTRAST  Result Date: 11/10/2020 CLINICAL DATA:  Pleural effusion, malignancy suspected in a 70 year old male with history of end-stage renal disease and diabetes with chronic CHF and reported history of metastatic prostate neoplasm. EXAM: CT CHEST WITH CONTRAST TECHNIQUE: Multidetector CT imaging of the chest was performed  during intravenous contrast administration. CONTRAST:  36mL OMNIPAQUE IOHEXOL 300 MG/ML  SOLN COMPARISON:  July 20, 2019. FINDINGS: Cardiovascular: Limited opacification of cardiovascular structures in the setting of heart failure and anasarca. The heart size is moderate to markedly enlarged, similar to the prior study. Central pulmonary vasculature grossly normal on very limited assessment. Aortic caliber and contour appears grossly stable. Scattered atheromatous plaque in the thoracic aorta. Signs of body wall collaterals potentially related to venous stenosis or arm position along the RIGHT chest in this patient with RIGHT sided IJ dialysis catheter which terminates at the upper portion of the RIGHT atrium. Mediastinum/Nodes: Patulous esophagus. Mildly enlarged mediastinal lymph nodes largest 13 mm previously 12 mm (image 49/4) limited assessment of the chest due to extensive anasarca. Post LEFT brachiocephalic, subclavian and axillary stenting partially imaged  into the LEFT upper arm where there is a dialysis graft or fistula. Lungs/Pleura: Large LEFT pleural effusion increased from previous imaging associated with volume loss/airspace disease in the LEFT lung base. Some shift of mediastinal structures due to this large LEFT pleural effusion. RIGHT chest with atelectasis and scarring. Nodule seen on previous imaging are not visualized on the current study. Much of the LEFT lung is collapsed however limiting assessment of the LEFT lung. Tracheal deviation in the setting of large effusion. Airways to the RIGHT lung are patent. Upper Abdomen: Reflux of contrast into hepatic veins. Nodular liver contours with fissural widening. No acute upper abdominal process though there is large volume ascites. Musculoskeletal: Extensive body wall edema blurs fascial planes. Multifocal sclerotic bony lesions are most suggestive of prostate cancer metastases and are more numerous than on the previous study involving bilateral ribs,  sternum and numerous levels of the spine. Sternal lesions are new compared to previous imaging. RIGHT posterior sixth rib and seventh rib also new compared to previous imaging. Numerous new scattered foci of bony metastases seen in the LEFT ribs as well. No acute finding related to visualized clavicles or scapulae, enlarging LEFT scapular sclerotic lesion now measures 16 mm as compared to 4-5 mm on the prior exam. There are numerous new lesions throughout the spine as well, for instance posterior elements at T1, new lesion at T4 and new lesions in the T10 and T11 vertebral levels and subjacent T12, enlargement of existing lesions throughout. No acute bone finding. IMPRESSION: Anasarca, findings likely related to volume overload and or heart failure in this patient on dialysis. Extensive stenting of LEFT upper extremity extending into central venous structures not well assessed due to phase of contrast enhancement. Large LEFT pleural effusion, causing mediastinal shift, increased from previous imaging and associated with volume loss/airspace disease in the LEFT lung base LEFT lower. Effusion is partially loculated but without visible nodularity. Pulmonary nodules are not seen. Dense airspace disease at the LEFT lung base favored to represent rounded atelectasis. Pulmonary nodules not seen potentially related to infectious or inflammatory etiology. Findings that raise the question of liver disease. Worsening of osseous metastatic disease. Electronically Signed   By: Zetta Bills M.D.   On: 11/10/2020 14:23   IR THORACENTESIS ASP PLEURAL SPACE W/IMG GUIDE  Result Date: 11/09/2020 INDICATION: Patient with history of chronic AFib, ESRD on HD and CHF with left pleural effusion. Request was made for IR to perform diagnostic and therapeutic thoracentesis. EXAM: ULTRASOUND GUIDED DIAGNOSTIC AND THERAPEUTIC THORACENTESIS MEDICATIONS: 10 ML 1% LIDOCAINE COMPLICATIONS: None immediate. PROCEDURE: An ultrasound guided  thoracentesis was thoroughly discussed with the patient and patient's family member and questions answered. The benefits, risks, alternatives and complications were also discussed. The patient understands and wishes to proceed with the procedure. Written consent was obtained. Ultrasound was performed to localize and mark an adequate pocket of fluid in the left chest. The area was then prepped and draped in the normal sterile fashion. 1% Lidocaine was used for local anesthesia. Under ultrasound guidance a 6 Fr Safe-T-Centesis catheter was introduced. Thoracentesis was performed. The catheter was removed and a dressing applied. FINDINGS: A total of approximately 1.3 L of serosanguineous fluid was removed. Samples were sent to the laboratory as requested by the clinical team. IMPRESSION: Successful ultrasound guided left thoracentesis yielding 1.3 L of pleural fluid. Read by: Narda Rutherford, NP Electronically Signed   By: Sandi Mariscal M.D.   On: 11/09/2020 16:39    Scheduled Meds:  aspirin  325 mg Oral Daily   atorvastatin  80 mg Oral QHS   Chlorhexidine Gluconate Cloth  6 each Topical Q0600   clopidogrel  75 mg Oral Daily   darbepoetin (ARANESP) injection - DIALYSIS  150 mcg Intravenous Q Wed-HD   docusate sodium  100 mg Oral Daily   feeding supplement (NEPRO CARB STEADY)  237 mL Oral BID BM   gabapentin  100 mg Oral Daily   And   gabapentin  200 mg Oral QHS   levETIRAcetam  500 mg Oral BID   lidocaine  1 application Topical Daily   midodrine  15 mg Oral TID WC   pantoprazole  20 mg Oral Daily   Continuous Infusions:   LOS: 7 days   Time spent: 40 minutes. More than 50% of the time was spent in counseling/coordination of care  Lorella Nimrod, MD [facility]  If 7PM-7AM, please contact night-coverage [email or web address]  11/10/2020, 3:48 PM   This record has been created using Systems analyst. Errors have been sought and corrected,but may not always be located. Such creation  errors do not reflect on the standard of care.

## 2020-11-10 NOTE — Progress Notes (Signed)
Very difficult situation  . The patient wants to continue with dialysis . He understands that stopping dialysis would lead to his death. Really his blood pressure is so low that dialysis is fraught with complications and possibility of hypotension, stroke and/ or death. He understands the risks but wants to continue. Efforts have been made to contact family but they do not want to have a formal meeting with medical staff at this time and feel they have a clear understanding of the prognosis.  We will try to honor the wishes of the patient to the best of our ability, understanding that dialysis itself is now posing a danger to his life.  We hope that some acceptance can be reached on the part of the patient and we can move towards comfort care and being able to spend some time with loved ones

## 2020-11-10 NOTE — Progress Notes (Signed)
[facility] ROUNDING NOTE   Subjective:   Interval History: 70 year old gentleman with history end-stage renal disease chronic hypotension combined congestive heart failure.  History of cirrhosis hypoxic respiratory failure metastatic prostate cancer.  He received his regular dialysis treatment at [facility] in [place].  He was admitted with hypotension and dyspnea.  Blood pressure 80/53 pulse 91 temperature 97.7 O2 sats 93% 2 L nasal cannula last dialysis was 11/08/2020 unfortunately dialysis was limited by hypotension.  Unable to remove fluid.  Palliative medicine assisting.     Sodium 133 potassium 4 chloride 95 CO2 28 BUN 32 creatinine 4.4 glucose 138 calcium 8.5  Objective:  Vital signs in last 24 hours:  Temp:  [95.4 F (35.2 C)-98 F (36.7 C)] 97.7 F (36.5 C) (09/23 0344) Pulse Rate:  [80-100] 80 (09/23 0344) Resp:  [15-20] 19 (09/23 0344) BP: (80-112)/(45-66) 80/53 (09/23 0344) SpO2:  [90 %-99 %] 90 % (09/23 0344) Weight:  [86.1 kg] 86.1 kg (09/23 0344)  Weight change: 0.9 kg Filed Weights   11/08/20 0821 11/09/20 0334 11/10/20 0344  Weight: 85.2 kg 85.6 kg 86.1 kg    Intake/Output: I/O last 3 completed shifts: In: 220 [P.O.:220] Out: -    Intake/Output this shift:  No intake/output data recorded. WCB:JSEGBTDVVOH ill appearing, laying flat in bed CVS:rrr Resp:decreased air entry left fields, normal WOB, on 2LNC YWV:PXTGGYIRS, soft, nt WNI:OEVO bka, very trace edema rt LE Neuro: awake HD access: RIJ TDC, LUE    Basic Metabolic Panel: Recent Labs  Lab 11/06/20 0130 11/07/20 0139 11/08/20 0143 11/09/20 0130 11/10/20 0109  NA 131* 134* 135 134* 133*  K 4.5 3.6 3.9 3.9 4.0  CL 91* 95* 96* 98 95*  CO2 27 27 27 25 28   GLUCOSE 121* 124* 147* 138* 138*  BUN 36* 24* 34* 25* 32*  CREATININE 5.48* 3.95* 4.65* 3.66* 4.42*  CALCIUM 8.3* 8.3* 8.5* 8.7* 8.5*  PHOS 5.5* 4.0 4.1 3.5 4.0     Liver Function Tests: Recent Labs  Lab  11/03/20 1525 11/05/20 0110 11/06/20 0130 11/07/20 0139 11/08/20 0143 11/09/20 0130 11/10/20 0109  AST 43*  --   --   --   --   --   --   ALT 44  --   --   --   --   --   --   ALKPHOS 202*  --   --   --   --   --   --   BILITOT 1.0  --   --   --   --   --   --   PROT 6.2*  --   --   --   --   --   --   ALBUMIN 2.7*   < > 2.3* 2.7* 2.5* 2.6* 2.4*   < > = values in this interval not displayed.    No results for input(s): LIPASE, AMYLASE in the last 168 hours. No results for input(s): AMMONIA in the last 168 hours.  CBC: Recent Labs  Lab 11/03/20 1424 11/03/20 1500 11/06/20 1157  WBC  --  6.8 6.3  NEUTROABS  --  4.9  --   HGB 11.6* 10.1* 8.0*  HCT 34.0* 31.8* 24.2*  MCV  --  98.1 96.0  PLT  --  220 201     Cardiac Enzymes: No results for input(s): CKTOTAL, CKMB, CKMBINDEX, TROPONINI in the last 168 hours.  BNP: Invalid input(s): POCBNP  CBG: Recent Labs  Lab 11/03/20 1347  GLUCAP 119*  Microbiology: Results for orders placed or performed during the hospital encounter of 11/03/20  Resp Panel by RT-PCR (Flu A&B, Covid) Nasopharyngeal Swab     Status: None   Collection Time: 11/03/20  3:24 PM   Specimen: Nasopharyngeal Swab; Nasopharyngeal(NP) swabs in vial transport medium  Result Value Ref Range Status   SARS Coronavirus 2 by RT PCR NEGATIVE NEGATIVE Final    Comment: (NOTE) SARS-CoV-2 target nucleic acids are NOT DETECTED.  The SARS-CoV-2 RNA is generally detectable in upper respiratory specimens during the acute phase of infection. The lowest concentration of SARS-CoV-2 viral copies this assay can detect is 138 copies/mL. A negative result does not preclude SARS-Cov-2 infection and should not be used as the sole basis for treatment or other patient management decisions. A negative result may occur with  improper specimen collection/handling, submission of specimen other than nasopharyngeal swab, presence of viral mutation(s) within the areas  targeted by this assay, and inadequate number of viral copies(<138 copies/mL). A negative result must be combined with clinical observations, patient history, and epidemiological information. The expected result is Negative.  Fact Sheet for Patients:  EntrepreneurPulse.com.au  Fact Sheet for Healthcare Providers:  IncredibleEmployment.be  This test is no t yet approved or cleared by the [place] FDA and  has been authorized for detection and/or diagnosis of SARS-CoV-2 by FDA under an Emergency Use Authorization (EUA). This EUA will remain  in effect (meaning this test can be used) for the duration of the COVID-19 declaration under Section 564(b)(1) of the Act, 21 U.S.C.section 360bbb-3(b)(1), unless the authorization is terminated  or revoked sooner.       Influenza A by PCR NEGATIVE NEGATIVE Final   Influenza B by PCR NEGATIVE NEGATIVE Final    Comment: (NOTE) The Xpert Xpress SARS-CoV-2/FLU/RSV plus assay is intended as an aid in the diagnosis of influenza from Nasopharyngeal swab specimens and should not be used as a sole basis for treatment. Nasal washings and aspirates are unacceptable for Xpert Xpress SARS-CoV-2/FLU/RSV testing.  Fact Sheet for Patients: EntrepreneurPulse.com.au  Fact Sheet for Healthcare Providers: IncredibleEmployment.be  This test is not yet approved or cleared by the [place] FDA and has been authorized for detection and/or diagnosis of SARS-CoV-2 by FDA under an Emergency Use Authorization (EUA). This EUA will remain in effect (meaning this test can be used) for the duration of the COVID-19 declaration under Section 564(b)(1) of the Act, 21 U.S.C. section 360bbb-3(b)(1), unless the authorization is terminated or revoked.  Performed at [facility], [place] [address] [place], [place] [address]   Body fluid culture w Gram Stain     Status: None (Preliminary  result)   Collection Time: 11/09/20  4:07 PM   Specimen: Lung, Left; Pleural Fluid  Result Value Ref Range Status   Specimen Description FLUID PLEURAL LEFT  Final   Special Requests NONE  Final   Gram Stain   Final    FEW WBC PRESENT, PREDOMINANTLY MONONUCLEAR NO ORGANISMS SEEN Performed at [facility], [place] [address], [place], [place] [address]    Culture PENDING  Incomplete   Report Status PENDING  Incomplete    Coagulation Studies: No results for input(s): LABPROT, INR in the last 72 hours.  Urinalysis: No results for input(s): COLORURINE, LABSPEC, PHURINE, GLUCOSEU, HGBUR, BILIRUBINUR, KETONESUR, PROTEINUR, UROBILINOGEN, NITRITE, LEUKOCYTESUR in the last 72 hours.  Invalid input(s): APPERANCEUR    Imaging: DG Chest 1 View  Result Date: 11/09/2020 CLINICAL DATA:  Status post left-sided thoracentesis. EXAM: CHEST  1 VIEW COMPARISON:  November 03, 2020 FINDINGS: There is stable right-sided venous catheter positioning. A radiopaque vascular stent is seen overlying the left apex and left axilla. Moderate to marked severity airspace disease is seen throughout the left lung. A large left-sided pleural effusion is also noted. No pneumothorax is identified. The cardiac silhouette is enlarged and unchanged in size. There is stable left to right deviation of the mediastinal structures. The visualized skeletal structures are unremarkable. IMPRESSION: 1. Moderate to marked severity airspace disease throughout the left lung with large left-sided pleural effusion. 2. No pneumothorax in a patient is status post thoracentesis. Electronically Signed   By: Virgina Norfolk M.D.   On: 11/09/2020 16:35   IR THORACENTESIS ASP PLEURAL SPACE W/IMG GUIDE  Result Date: 11/09/2020 INDICATION: Patient with history of chronic AFib, ESRD on HD and CHF with left pleural effusion. Request was made for IR to perform diagnostic and therapeutic thoracentesis. EXAM: ULTRASOUND GUIDED DIAGNOSTIC AND  THERAPEUTIC THORACENTESIS MEDICATIONS: 10 ML 1% LIDOCAINE COMPLICATIONS: None immediate. PROCEDURE: An ultrasound guided thoracentesis was thoroughly discussed with the patient and patient's family member and questions answered. The benefits, risks, alternatives and complications were also discussed. The patient understands and wishes to proceed with the procedure. Written consent was obtained. Ultrasound was performed to localize and mark an adequate pocket of fluid in the left chest. The area was then prepped and draped in the normal sterile fashion. 1% Lidocaine was used for local anesthesia. Under ultrasound guidance a 6 Fr Safe-T-Centesis catheter was introduced. Thoracentesis was performed. The catheter was removed and a dressing applied. FINDINGS: A total of approximately 1.3 L of serosanguineous fluid was removed. Samples were sent to the laboratory as requested by the clinical team. IMPRESSION: Successful ultrasound guided left thoracentesis yielding 1.3 L of pleural fluid. Read by: Narda Rutherford, NP Electronically Signed   By: Sandi Mariscal M.D.   On: 11/09/2020 16:39     Medications:      aspirin  325 mg Oral Daily   atorvastatin  80 mg Oral QHS   Chlorhexidine Gluconate Cloth  6 each Topical Q0600   clopidogrel  75 mg Oral Daily   darbepoetin (ARANESP) injection - DIALYSIS  150 mcg Intravenous Q Wed-HD   docusate sodium  100 mg Oral Daily   feeding supplement (NEPRO CARB STEADY)  237 mL Oral BID BM   gabapentin  100 mg Oral Daily   And   gabapentin  200 mg Oral QHS   levETIRAcetam  500 mg Oral BID   lidocaine  1 application Topical Daily   midodrine  15 mg Oral TID WC   pantoprazole  20 mg Oral Daily   acetaminophen **OR** acetaminophen, albuterol, ondansetron **OR** ondansetron (ZOFRAN) IV  Assessment/ Plan:  # ESRD:  -outpatient orders: [facility], MWF, EDW 75 kg, left upper extremity AVG (also has TDC), 4 hours, 400/600, 3K, 2.5 calcium, heparin 3800unit bolus (no  maintenance)  Next dialysis treatment will be 11/10/2020   #Pleural effusion, left -recommend thora;per primary, HD will likely not be able to fix this   #Cirrhosis w/ ascites -recommend para, per primary   # Volume/ chronic hypotension: EDW 75kg.  Ultrafiltration limited by hypotension.  Currently receiving 15 mg of midodrine 3 times daily   # Anemia of Chronic Kidney Disease: Drop in hemoglobin 8.0 we will add ESA   # Secondary Hyperparathyroidism/Hyperphosphatemia: monitor phos    # Vascular access: has LUE AVG that is being cannulated, if here over the weekend, then can  arrange for [facility] to be removed w/ IR    LOS: Grant @TODAY @7 :30 AM

## 2020-11-11 DIAGNOSIS — I959 Hypotension, unspecified: Secondary | ICD-10-CM | POA: Diagnosis not present

## 2020-11-11 LAB — RENAL FUNCTION PANEL
Albumin: 2.4 g/dL — ABNORMAL LOW (ref 3.5–5.0)
Albumin: 2.3 g/dL — ABNORMAL LOW (ref 3.5–5.0)
Anion gap: 10 (ref 5–15)
Anion gap: 10 (ref 5–15)
BUN: 37 mg/dL — ABNORMAL HIGH (ref 8–23)
BUN: 39 mg/dL — ABNORMAL HIGH (ref 8–23)
CO2: 27 mmol/L (ref 22–32)
CO2: 27 mmol/L (ref 22–32)
Calcium: 8.5 mg/dL — ABNORMAL LOW (ref 8.9–10.3)
Calcium: 8.6 mg/dL — ABNORMAL LOW (ref 8.9–10.3)
Chloride: 95 mmol/L — ABNORMAL LOW (ref 98–111)
Chloride: 96 mmol/L — ABNORMAL LOW (ref 98–111)
Creatinine, Ser: 4.85 mg/dL — ABNORMAL HIGH (ref 0.61–1.24)
Creatinine, Ser: 4.99 mg/dL — ABNORMAL HIGH (ref 0.61–1.24)
GFR, Estimated: 12 mL/min — ABNORMAL LOW (ref >60)
GFR, Estimated: 12 mL/min — ABNORMAL LOW (ref >60)
Glucose, Bld: 129 mg/dL — ABNORMAL HIGH (ref 70–99)
Glucose, Bld: 138 mg/dL — ABNORMAL HIGH (ref 70–99)
Phosphorus: 4.3 mg/dL (ref 2.5–4.6)
Phosphorus: 4.3 mg/dL (ref 2.5–4.6)
Potassium: 4.4 mmol/L (ref 3.5–5.1)
Potassium: 4.3 mmol/L (ref 3.5–5.1)
Sodium: 132 mmol/L — ABNORMAL LOW (ref 135–145)
Sodium: 133 mmol/L — ABNORMAL LOW (ref 135–145)

## 2020-11-11 LAB — CBC
HCT: 25.7 % — ABNORMAL LOW (ref 39.0–52.0)
Hemoglobin: 8.3 g/dL — ABNORMAL LOW (ref 13.0–17.0)
MCH: 31.4 pg (ref 26.0–34.0)
MCHC: 32.3 g/dL (ref 30.0–36.0)
MCV: 97.3 fL (ref 80.0–100.0)
Platelets: 199 10*3/uL (ref 150–400)
RBC: 2.64 MIL/uL — ABNORMAL LOW (ref 4.22–5.81)
RDW: 15 % (ref 11.5–15.5)
WBC: 6.7 10*3/uL (ref 4.0–10.5)
nRBC: 0.3 % — ABNORMAL HIGH (ref 0.0–0.2)

## 2020-11-11 MED ORDER — LIDOCAINE-PRILOCAINE 2.5-2.5 % EX CREA
1.0000 "application " | TOPICAL_CREAM | CUTANEOUS | Status: DC | PRN
  Filled 2020-11-11: qty 5

## 2020-11-11 MED ORDER — PENTAFLUOROPROP-TETRAFLUOROETH EX AERO: 1.0000 "application " | INHALATION_SPRAY | CUTANEOUS | Status: DC | PRN

## 2020-11-11 MED ORDER — SODIUM CHLORIDE 0.9 % IV SOLN: 100.0000 mL | INTRAVENOUS | Status: DC | PRN

## 2020-11-11 MED ORDER — HEPARIN SODIUM (PORCINE) 1000 UNIT/ML DIALYSIS
1000.0000 [IU] | INTRAMUSCULAR | Status: DC | PRN
  Filled 2020-11-11: qty 1

## 2020-11-11 MED ORDER — LIDOCAINE HCL (PF) 1 % IJ SOLN: 5.0000 mL | INTRAMUSCULAR | Status: DC | PRN

## 2020-11-11 MED ORDER — ALTEPLASE 2 MG IJ SOLR: 2.0000 mg | Freq: Once | INTRAMUSCULAR | Status: DC | PRN

## 2020-11-11 NOTE — Progress Notes (Signed)
[facility] ROUNDING NOTE   Subjective:   Interval History: 70 year old gentleman with history end-stage renal disease chronic hypotension combined congestive heart failure.  History of cirrhosis hypoxic respiratory failure metastatic prostate cancer.  He received his regular dialysis treatment at [facility] in [place].  He was admitted with hypotension and dyspnea.  Blood pressure 98/55 pulse 93 temperature 97.9 O2 sats 88% 3 L nasal cannula.  Unfortunately dialysis was limited by hypotension.  Unable to remove fluid.  Palliative medicine assisting.   Is currently receiving dialysis 11/11/2020  Ongoing discussions with family.  Patient wishes to continue dialysis despite risks  Sodium 133 potassium 4.3 chloride 96 CO2 27 BUN 39 creatinine 4.99 glucose 138 calcium 8.6 phosphorus 4.3 albumin 2.3 hemoglobin 8.3  Objective:  Vital signs in last 24 hours:  Temp:  [97.6 F (36.4 C)-98.4 F (36.9 C)] 97.9 F (36.6 C) (09/24 0458) Pulse Rate:  [61-95] 72 (09/24 0730) Resp:  [13-22] 22 (09/24 0530) BP: (76-98)/(43-62) 98/55 (09/24 0800) SpO2:  [90 %-100 %] 92 % (09/24 0530) Weight:  [88.2 kg] 88.2 kg (09/24 0458)  Weight change: 2.1 kg Filed Weights   11/10/20 0344 11/11/20 0421 11/11/20 0458  Weight: 86.1 kg 88.2 kg 88.2 kg    Intake/Output: I/O last 3 completed shifts: In: 1060 [P.O.:1060] Out: -    Intake/Output this shift:  No intake/output data recorded. NLG:XQJJHERDEYC ill appearing, laying flat in bed CVS:rrr Resp:decreased air entry left fields, normal WOB, on 2LNC XKG:YJEHUDJSH, soft, nt FWY:OVZC bka, very trace edema rt LE Neuro: awake HD access: RIJ TDC, LUE    Basic Metabolic Panel: Recent Labs  Lab 11/08/20 0143 11/09/20 0130 11/10/20 0109 11/11/20 0112 11/11/20 0430  NA 135 134* 133* 132* 133*  K 3.9 3.9 4.0 4.4 4.3  CL 96* 98 95* 95* 96*  CO2 27 25 28 27 27   GLUCOSE 147* 138* 138* 129* 138*  BUN 34* 25* 32* 37* 39*   CREATININE 4.65* 3.66* 4.42* 4.85* 4.99*  CALCIUM 8.5* 8.7* 8.5* 8.5* 8.6*  PHOS 4.1 3.5 4.0 4.3 4.3     Liver Function Tests: Recent Labs  Lab 11/08/20 0143 11/09/20 0130 11/10/20 0109 11/11/20 0112 11/11/20 0430  ALBUMIN 2.5* 2.6* 2.4* 2.4* 2.3*    No results for input(s): LIPASE, AMYLASE in the last 168 hours. No results for input(s): AMMONIA in the last 168 hours.  CBC: Recent Labs  Lab 11/06/20 1157 11/11/20 0430  WBC 6.3 6.7  HGB 8.0* 8.3*  HCT 24.2* 25.7*  MCV 96.0 97.3  PLT 201 199     Cardiac Enzymes: No results for input(s): CKTOTAL, CKMB, CKMBINDEX, TROPONINI in the last 168 hours.  BNP: Invalid input(s): POCBNP  CBG: No results for input(s): GLUCAP in the last 168 hours.   Microbiology: Results for orders placed or performed during the hospital encounter of 11/03/20  Resp Panel by RT-PCR (Flu A&B, Covid) Nasopharyngeal Swab     Status: None   Collection Time: 11/03/20  3:24 PM   Specimen: Nasopharyngeal Swab; Nasopharyngeal(NP) swabs in vial transport medium  Result Value Ref Range Status   SARS Coronavirus 2 by RT PCR NEGATIVE NEGATIVE Final    Comment: (NOTE) SARS-CoV-2 target nucleic acids are NOT DETECTED.  The SARS-CoV-2 RNA is generally detectable in upper respiratory specimens during the acute phase of infection. The lowest concentration of SARS-CoV-2 viral copies this assay can detect is 138 copies/mL. A negative result does not preclude SARS-Cov-2 infection and should not be used as the sole  basis for treatment or other patient management decisions. A negative result may occur with  improper specimen collection/handling, submission of specimen other than nasopharyngeal swab, presence of viral mutation(s) within the areas targeted by this assay, and inadequate number of viral copies(<138 copies/mL). A negative result must be combined with clinical observations, patient history, and epidemiological information. The expected result  is Negative.  Fact Sheet for Patients:  EntrepreneurPulse.com.au  Fact Sheet for Healthcare Providers:  IncredibleEmployment.be  This test is no t yet approved or cleared by the [place] FDA and  has been authorized for detection and/or diagnosis of SARS-CoV-2 by FDA under an Emergency Use Authorization (EUA). This EUA will remain  in effect (meaning this test can be used) for the duration of the COVID-19 declaration under Section 564(b)(1) of the Act, 21 U.S.C.section 360bbb-3(b)(1), unless the authorization is terminated  or revoked sooner.       Influenza A by PCR NEGATIVE NEGATIVE Final   Influenza B by PCR NEGATIVE NEGATIVE Final    Comment: (NOTE) The Xpert Xpress SARS-CoV-2/FLU/RSV plus assay is intended as an aid in the diagnosis of influenza from Nasopharyngeal swab specimens and should not be used as a sole basis for treatment. Nasal washings and aspirates are unacceptable for Xpert Xpress SARS-CoV-2/FLU/RSV testing.  Fact Sheet for Patients: EntrepreneurPulse.com.au  Fact Sheet for Healthcare Providers: IncredibleEmployment.be  This test is not yet approved or cleared by the [place] FDA and has been authorized for detection and/or diagnosis of SARS-CoV-2 by FDA under an Emergency Use Authorization (EUA). This EUA will remain in effect (meaning this test can be used) for the duration of the COVID-19 declaration under Section 564(b)(1) of the Act, 21 U.S.C. section 360bbb-3(b)(1), unless the authorization is terminated or revoked.  Performed at [facility], [place] [address] [place], [place] [address]   Body fluid culture w Gram Stain     Status: None (Preliminary result)   Collection Time: 11/09/20  4:07 PM   Specimen: Lung, Left; Pleural Fluid  Result Value Ref Range Status   Specimen Description FLUID PLEURAL LEFT  Final   Special Requests NONE  Final   Gram Stain    Final    FEW WBC PRESENT, PREDOMINANTLY MONONUCLEAR NO ORGANISMS SEEN    Culture   Final    NO GROWTH < 24 HOURS Performed at [facility], [place] [address], [place], [place] [address]    Report Status PENDING  Incomplete    Coagulation Studies: No results for input(s): LABPROT, INR in the last 72 hours.  Urinalysis: No results for input(s): COLORURINE, LABSPEC, PHURINE, GLUCOSEU, HGBUR, BILIRUBINUR, KETONESUR, PROTEINUR, UROBILINOGEN, NITRITE, LEUKOCYTESUR in the last 72 hours.  Invalid input(s): APPERANCEUR    Imaging: DG Chest 1 View  Result Date: 11/09/2020 CLINICAL DATA:  Status post left-sided thoracentesis. EXAM: CHEST  1 VIEW COMPARISON:  November 03, 2020 FINDINGS: There is stable right-sided venous catheter positioning. A radiopaque vascular stent is seen overlying the left apex and left axilla. Moderate to marked severity airspace disease is seen throughout the left lung. A large left-sided pleural effusion is also noted. No pneumothorax is identified. The cardiac silhouette is enlarged and unchanged in size. There is stable left to right deviation of the mediastinal structures. The visualized skeletal structures are unremarkable. IMPRESSION: 1. Moderate to marked severity airspace disease throughout the left lung with large left-sided pleural effusion. 2. No pneumothorax in a patient is status post thoracentesis. Electronically Signed   By: Joyce Gross.D.  On: 11/09/2020 16:35   CT CHEST W CONTRAST  Result Date: 11/10/2020 CLINICAL DATA:  Pleural effusion, malignancy suspected in a 70 year old male with history of end-stage renal disease and diabetes with chronic CHF and reported history of metastatic prostate neoplasm. EXAM: CT CHEST WITH CONTRAST TECHNIQUE: Multidetector CT imaging of the chest was performed during intravenous contrast administration. CONTRAST:  21mL OMNIPAQUE IOHEXOL 300 MG/ML  SOLN COMPARISON:  July 20, 2019. FINDINGS: Cardiovascular: Limited  opacification of cardiovascular structures in the setting of heart failure and anasarca. The heart size is moderate to markedly enlarged, similar to the prior study. Central pulmonary vasculature grossly normal on very limited assessment. Aortic caliber and contour appears grossly stable. Scattered atheromatous plaque in the thoracic aorta. Signs of body wall collaterals potentially related to venous stenosis or arm position along the RIGHT chest in this patient with RIGHT sided IJ dialysis catheter which terminates at the upper portion of the RIGHT atrium. Mediastinum/Nodes: Patulous esophagus. Mildly enlarged mediastinal lymph nodes largest 13 mm previously 12 mm (image 49/4) limited assessment of the chest due to extensive anasarca. Post LEFT brachiocephalic, subclavian and axillary stenting partially imaged into the LEFT upper arm where there is a dialysis graft or fistula. Lungs/Pleura: Large LEFT pleural effusion increased from previous imaging associated with volume loss/airspace disease in the LEFT lung base. Some shift of mediastinal structures due to this large LEFT pleural effusion. RIGHT chest with atelectasis and scarring. Nodule seen on previous imaging are not visualized on the current study. Much of the LEFT lung is collapsed however limiting assessment of the LEFT lung. Tracheal deviation in the setting of large effusion. Airways to the RIGHT lung are patent. Upper Abdomen: Reflux of contrast into hepatic veins. Nodular liver contours with fissural widening. No acute upper abdominal process though there is large volume ascites. Musculoskeletal: Extensive body wall edema blurs fascial planes. Multifocal sclerotic bony lesions are most suggestive of prostate cancer metastases and are more numerous than on the previous study involving bilateral ribs, sternum and numerous levels of the spine. Sternal lesions are new compared to previous imaging. RIGHT posterior sixth rib and seventh rib also new  compared to previous imaging. Numerous new scattered foci of bony metastases seen in the LEFT ribs as well. No acute finding related to visualized clavicles or scapulae, enlarging LEFT scapular sclerotic lesion now measures 16 mm as compared to 4-5 mm on the prior exam. There are numerous new lesions throughout the spine as well, for instance posterior elements at T1, new lesion at T4 and new lesions in the T10 and T11 vertebral levels and subjacent T12, enlargement of existing lesions throughout. No acute bone finding. IMPRESSION: Anasarca, findings likely related to volume overload and or heart failure in this patient on dialysis. Extensive stenting of LEFT upper extremity extending into central venous structures not well assessed due to phase of contrast enhancement. Large LEFT pleural effusion, causing mediastinal shift, increased from previous imaging and associated with volume loss/airspace disease in the LEFT lung base LEFT lower. Effusion is partially loculated but without visible nodularity. Pulmonary nodules are not seen. Dense airspace disease at the LEFT lung base favored to represent rounded atelectasis. Pulmonary nodules not seen potentially related to infectious or inflammatory etiology. Findings that raise the question of liver disease. Worsening of osseous metastatic disease. Electronically Signed   By: Zetta Bills M.D.   On: 11/10/2020 14:23   IR THORACENTESIS ASP PLEURAL SPACE W/IMG GUIDE  Result Date: 11/09/2020 INDICATION: Patient with history of chronic AFib,  ESRD on HD and CHF with left pleural effusion. Request was made for IR to perform diagnostic and therapeutic thoracentesis. EXAM: ULTRASOUND GUIDED DIAGNOSTIC AND THERAPEUTIC THORACENTESIS MEDICATIONS: 10 ML 1% LIDOCAINE COMPLICATIONS: None immediate. PROCEDURE: An ultrasound guided thoracentesis was thoroughly discussed with the patient and patient's family member and questions answered. The benefits, risks, alternatives and  complications were also discussed. The patient understands and wishes to proceed with the procedure. Written consent was obtained. Ultrasound was performed to localize and mark an adequate pocket of fluid in the left chest. The area was then prepped and draped in the normal sterile fashion. 1% Lidocaine was used for local anesthesia. Under ultrasound guidance a 6 Fr Safe-T-Centesis catheter was introduced. Thoracentesis was performed. The catheter was removed and a dressing applied. FINDINGS: A total of approximately 1.3 L of serosanguineous fluid was removed. Samples were sent to the laboratory as requested by the clinical team. IMPRESSION: Successful ultrasound guided left thoracentesis yielding 1.3 L of pleural fluid. Read by: Narda Rutherford, NP Electronically Signed   By: Sandi Mariscal M.D.   On: 11/09/2020 16:39     Medications:    sodium chloride     sodium chloride       aspirin  325 mg Oral Daily   atorvastatin  80 mg Oral QHS   Chlorhexidine Gluconate Cloth  6 each Topical Q0600   clopidogrel  75 mg Oral Daily   darbepoetin (ARANESP) injection - DIALYSIS  150 mcg Intravenous Q Wed-HD   docusate sodium  100 mg Oral Daily   feeding supplement (NEPRO CARB STEADY)  237 mL Oral BID BM   gabapentin  100 mg Oral Daily   And   gabapentin  200 mg Oral QHS   levETIRAcetam  500 mg Oral BID   lidocaine  1 application Topical Daily   midodrine  15 mg Oral TID WC   pantoprazole  20 mg Oral Daily   sodium chloride, sodium chloride, acetaminophen **OR** acetaminophen, albuterol, alteplase, heparin, lidocaine (PF), lidocaine-prilocaine, ondansetron **OR** ondansetron (ZOFRAN) IV, pentafluoroprop-tetrafluoroeth  Assessment/ Plan:  # ESRD:  -outpatient orders: [facility], MWF, EDW 75 kg, left upper extremity AVG (also has TDC), 4 hours, 400/600, 3K, 2.5 calcium, heparin 3800unit bolus (no maintenance) Patient appears to be receiving dialysis 11/11/2020.  He is off schedule due to high dialysis  volumes/census   #Pleural effusion, left -  thora;per primary, HD will likely not be able to fix this   #Cirrhosis w/ ascites - para, per primary   # Volume/ chronic hypotension: EDW 75kg.  Ultrafiltration limited by hypotension.  Currently receiving 15 mg of midodrine 3 times daily   # Anemia of Chronic Kidney Disease: Drop in hemoglobin 8.0.  ESA added   # Secondary Hyperparathyroidism/Hyperphosphatemia: Appears to be in goal.   # Vascular access: has LUE AVG that is being cannulated, if here over the weekend, then can arrange for [facility] to be removed w/ IR    LOS: 8 Sherril Croon @TODAY @8 :39 AM

## 2020-11-11 NOTE — Progress Notes (Signed)
PROGRESS NOTE    George Burgess  MVE:[id number] DOB: [birth year] DOA: 11/03/2020 PCP: Caprice Renshaw, MD   Brief Narrative: Taken from prior notes. 70 year old man complicated PMH including ESRD, chronic hypotension, chronic combined CHF, cirrhosis, chronic hypoxic respiratory failure, metastatic prostate cancer, presenting with shortness of breath.  Admitted for acute on chronic hypoxic respiratory failure, initially thought secondary to pleural effusion, however patient remains at baseline oxygen requirement despite presence of effusion.  Condition complicated by recently missed dialysis session.  He appears clinically stable with chronic hypotension, however he has not successfully dialyzed during hospitalization, no volume was able to be removed 9/19.  Patient had another dialysis today-blood pressure becoming soft even with albumin.  Unable to do much volume removal due to softer blood pressure. Thoracentesis was ordered. Palliative care was consulted-patient and family decided to remain full code with full scope of medical care.  If continue not to tolerate dialysis then palliative care needs to revisit and go to hospice home.  Unable to complete dialysis due to softer blood pressure.  Family still wants full scope of care.  Unable to remove extra fluid.  Thoracentesis ordered but doubt that will help. Patient is very high risk for deterioration and death-appropriate for comfort measures only.  9/23: Left thoracentesis with removal of 1.2 L of fluid, unable to take more fluid off as patient started coughing and becoming uncomfortable.  Initial labs seems exudative but cultures so far negative. CT chest was ordered because of his history of metastatic prostate cancer, that did not show lung lesions but did show worsening of osseous metastasis and liver nodularity.  Large volume ascites.  9/24: Unable to get dialysis yesterday, received earlier today, able to take about 600 cc of fluid, appears  very lethargic.  Still wants to continue everything.  Subjective: Patient was seen soon after the dialysis.  Appears very tired and lethargic.  Assessment & Plan:   Principal Problem:   Hypotension Active Problems:   Seizure ([place])   ESRD on hemodialysis (HCC)   Atrial fibrillation, chronic   Anemia due to chronic kidney disease   Dementia (HCC)   Chronic combined systolic (congestive) and diastolic (congestive) heart failure (HCC)   PVD (peripheral vascular disease) (HCC)   CAD (coronary artery disease)   Prostate cancer, primary, with metastasis from prostate to other site [facility])   Type 2 diabetes mellitus with chronic kidney disease on chronic dialysis ([place])   Cirrhosis of liver with ascites ([place])   Pleural effusion on left   Pressure injury of skin   Chronic respiratory failure with hypoxia (HCC)   Hx of BKA (HCC)  Hypotension.  Blood pressure remained soft during dialysis despite giving albumin.  Unable to remove extra fluid.  He is not a candidate for CRRT per nephrology. -Continue with midodrine -Might not be able to tolerate dialysis as unable to remove fluid. -Currently wants to remain full code with full scope of care. -Patient is appropriate for comfort measures only but family does not agree with that. -Appreciate palliative care help-a family meeting with nephrology might be helpful.    Recurrent left pleural effusion.  Currently stable on home oxygen requirement of 2 L. -Left-sided thoracentesis-removal of 1.2 L of exudative fluid, preliminary cultures negative.  Unable to tolerate more removal, postprocedural chest x-ray with significant remaining pleural effusion. CT chest with large bilateral pleural effusions, with basal atelectasis, large volume ascites and liver nodularity.  No pulmonary nodule noted but there is an extension of osseous metastatic disease.  Seems more consistent with heart failure and ESRD as we are unable to remove fluid due to softer blood  pressure.  Cirrhosis of liver with ascites.  Imaging with anasarca and large volume ascites. -Continue to monitor -Ordered paracentesis for comfort reason-high risk for rapid reaccumulation.  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 --monitor   Chronic combined systolic (congestive) and diastolic (congestive) heart failure (HCC)     -Worsening anasarca and fluid retention as unable to remove fluid with dialysis due to hypotension. -- continue to hold Toprol given BP; not on diuretic (ESRD) --Last EF noted to be around 20-25% with left ventricular global hypokinesis and moderate pulmonary hypertension at [facility] in 06/2020.   ESRD on hemodialysis [facility]) --nephrology consultation, in past noted to not be a candidate for CRRT. Previous HD limited by chronic hypotension. -Patient desired to continue despite worsening volume status and unable to tolerate dialysis.   Chronic respiratory failure with hypoxia (HCC) --stable. Continue supplemental oxygen.   Dementia ([place]) --appears stable. Not on any outpt meds. Delirium precautions.   Atrial fibrillation, chronic --stable. Toprol on hold for low BP.  Felt to be a poor candidate for anticoagulation by cardiology in the past.   Seizure ([place]) --stable, continue Keppra, gabapentin   Prostate cancer, primary, with metastasis from prostate to other site [facility]) --followed by urology, treated w/ [facility]. -Repeat imaging with worsening of metastatic disease.   CAD (coronary artery disease) --asymptomatic, continue aspirin, statin   PVD (peripheral vascular disease) (HCC) --s/p BKA. Stable.    Anemia due to chronic kidney disease --stable -Continue with Aranesp    Objective: Vitals:   11/11/20 0900 11/11/20 0910 11/11/20 0937 11/11/20 1214  BP: (!) 98/50 (!) 91/44 (!) 87/61 (!) 84/57  Pulse:  82 86 85  Resp:  18 17 20   Temp:  (!) 97.4 F (36.3 C) (!) 97.2 F (36.2  C) (!) 97.4 F (36.3 C)  TempSrc:  Oral Oral Oral  SpO2:  92% 93% 96%  Weight:  87.6 kg      Intake/Output Summary (Last 24 hours) at 11/11/2020 1405 Last data filed at 11/11/2020 0910 Gross per 24 hour  Intake 480 ml  Output 614 ml  Net -134 ml    Filed Weights   11/11/20 0421 11/11/20 0458 11/11/20 0910  Weight: 88.2 kg 88.2 kg 87.6 kg    Examination:  General.  Chronically ill-appearing gentleman, in no acute distress. Pulmonary.  Lungs clear bilaterally, normal respiratory effort.  Decreased breath sound at bases CV.  Regular rate and rhythm, no JVD, rub or murmur. Abdomen.  Soft, nontender, distended, BS positive.  Positive fluid wave CNS.  Somnolent and lethargic, no apparent focal deficit Extremities.  2+ RLE edema, left BKA Psychiatry.  Judgment and insight appears impaired.   DVT prophylaxis:  Code Status: Full Family Communication:  Disposition Plan:  Status is: Inpatient  Remains inpatient appropriate because:Inpatient level of care appropriate due to severity of illness  Dispo: The patient is from: Home              Anticipated d/c is to:  To be determined              Patient currently is not medically stable to d/c.   Difficult to place patient No               Level of care: Progressive  All the records are reviewed and case discussed  with Care Management/Social Worker. Management plans discussed with the patient, nursing and they are in agreement.  Consultants:  Nephrology  Procedures:  Antimicrobials:   Data Reviewed: I have personally reviewed following labs and imaging studies  CBC: Recent Labs  Lab 11/06/20 1157 11/11/20 0430  WBC 6.3 6.7  HGB 8.0* 8.3*  HCT 24.2* 25.7*  MCV 96.0 97.3  PLT 201 631    Basic Metabolic Panel: Recent Labs  Lab 11/08/20 0143 11/09/20 0130 11/10/20 0109 11/11/20 0112 11/11/20 0430  NA 135 134* 133* 132* 133*  K 3.9 3.9 4.0 4.4 4.3  CL 96* 98 95* 95* 96*  CO2 27 25 28 27 27   GLUCOSE 147* 138*  138* 129* 138*  BUN 34* 25* 32* 37* 39*  CREATININE 4.65* 3.66* 4.42* 4.85* 4.99*  CALCIUM 8.5* 8.7* 8.5* 8.5* 8.6*  PHOS 4.1 3.5 4.0 4.3 4.3    GFR: CrCl cannot be calculated (Unknown ideal weight.). Liver Function Tests: Recent Labs  Lab 11/08/20 0143 11/09/20 0130 11/10/20 0109 11/11/20 0112 11/11/20 0430  ALBUMIN 2.5* 2.6* 2.4* 2.4* 2.3*    No results for input(s): LIPASE, AMYLASE in the last 168 hours. No results for input(s): AMMONIA in the last 168 hours. Coagulation Profile: No results for input(s): INR, PROTIME in the last 168 hours. Cardiac Enzymes: No results for input(s): CKTOTAL, CKMB, CKMBINDEX, TROPONINI in the last 168 hours. BNP (last 3 results) No results for input(s): PROBNP in the last 8760 hours. HbA1C: No results for input(s): HGBA1C in the last 72 hours. CBG: No results for input(s): GLUCAP in the last 168 hours.  Lipid Profile: No results for input(s): CHOL, HDL, LDLCALC, TRIG, CHOLHDL, LDLDIRECT in the last 72 hours. Thyroid Function Tests: No results for input(s): TSH, T4TOTAL, FREET4, T3FREE, THYROIDAB in the last 72 hours. Anemia Panel: No results for input(s): VITAMINB12, FOLATE, FERRITIN, TIBC, IRON, RETICCTPCT in the last 72 hours. Sepsis Labs: No results for input(s): PROCALCITON, LATICACIDVEN in the last 168 hours.   Recent Results (from the past 240 hour(s))  Resp Panel by RT-PCR (Flu A&B, Covid) Nasopharyngeal Swab     Status: None   Collection Time: 11/03/20  3:24 PM   Specimen: Nasopharyngeal Swab; Nasopharyngeal(NP) swabs in vial transport medium  Result Value Ref Range Status   SARS Coronavirus 2 by RT PCR NEGATIVE NEGATIVE Final    Comment: (NOTE) SARS-CoV-2 target nucleic acids are NOT DETECTED.  The SARS-CoV-2 RNA is generally detectable in upper respiratory specimens during the acute phase of infection. The lowest concentration of SARS-CoV-2 viral copies this assay can detect is 138 copies/mL. A negative result does not  preclude SARS-Cov-2 infection and should not be used as the sole basis for treatment or other patient management decisions. A negative result may occur with  improper specimen collection/handling, submission of specimen other than nasopharyngeal swab, presence of viral mutation(s) within the areas targeted by this assay, and inadequate number of viral copies(<138 copies/mL). A negative result must be combined with clinical observations, patient history, and epidemiological information. The expected result is Negative.  Fact Sheet for Patients:  EntrepreneurPulse.com.au  Fact Sheet for Healthcare Providers:  IncredibleEmployment.be  This test is no t yet approved or cleared by the [place] FDA and  has been authorized for detection and/or diagnosis of SARS-CoV-2 by FDA under an Emergency Use Authorization (EUA). This EUA will remain  in effect (meaning this test can be used) for the duration of the COVID-19 declaration under Section 564(b)(1) of the Act, 21 U.S.C.section 360bbb-3(b)(1),  unless the authorization is terminated  or revoked sooner.       Influenza A by PCR NEGATIVE NEGATIVE Final   Influenza B by PCR NEGATIVE NEGATIVE Final    Comment: (NOTE) The Xpert Xpress SARS-CoV-2/FLU/RSV plus assay is intended as an aid in the diagnosis of influenza from Nasopharyngeal swab specimens and should not be used as a sole basis for treatment. Nasal washings and aspirates are unacceptable for Xpert Xpress SARS-CoV-2/FLU/RSV testing.  Fact Sheet for Patients: EntrepreneurPulse.com.au  Fact Sheet for Healthcare Providers: IncredibleEmployment.be  This test is not yet approved or cleared by the [place] FDA and has been authorized for detection and/or diagnosis of SARS-CoV-2 by FDA under an Emergency Use Authorization (EUA). This EUA will remain in effect (meaning this test can be used) for the  duration of the COVID-19 declaration under Section 564(b)(1) of the Act, 21 U.S.C. section 360bbb-3(b)(1), unless the authorization is terminated or revoked.  Performed at [facility], [place] [address], [place], [place] [address]   Body fluid culture w Gram Stain     Status: None (Preliminary result)   Collection Time: 11/09/20  4:07 PM   Specimen: Lung, Left; Pleural Fluid  Result Value Ref Range Status   Specimen Description FLUID PLEURAL LEFT  Final   Special Requests NONE  Final   Gram Stain   Final    FEW WBC PRESENT, PREDOMINANTLY MONONUCLEAR NO ORGANISMS SEEN    Culture   Final    NO GROWTH < 24 HOURS Performed at [facility], [place] [address], [place], [place] [address]    Report Status PENDING  Incomplete      Radiology Studies: DG Chest 1 View  Result Date: 11/09/2020 CLINICAL DATA:  Status post left-sided thoracentesis. EXAM: CHEST  1 VIEW COMPARISON:  November 03, 2020 FINDINGS: There is stable right-sided venous catheter positioning. A radiopaque vascular stent is seen overlying the left apex and left axilla. Moderate to marked severity airspace disease is seen throughout the left lung. A large left-sided pleural effusion is also noted. No pneumothorax is identified. The cardiac silhouette is enlarged and unchanged in size. There is stable left to right deviation of the mediastinal structures. The visualized skeletal structures are unremarkable. IMPRESSION: 1. Moderate to marked severity airspace disease throughout the left lung with large left-sided pleural effusion. 2. No pneumothorax in a patient is status post thoracentesis. Electronically Signed   By: Virgina Norfolk M.D.   On: 11/09/2020 16:35   CT CHEST W CONTRAST  Result Date: 11/10/2020 CLINICAL DATA:  Pleural effusion, malignancy suspected in a 70 year old male with history of end-stage renal disease and diabetes with chronic CHF and reported history of metastatic prostate neoplasm. EXAM: CT CHEST  WITH CONTRAST TECHNIQUE: Multidetector CT imaging of the chest was performed during intravenous contrast administration. CONTRAST:  28mL OMNIPAQUE IOHEXOL 300 MG/ML  SOLN COMPARISON:  July 20, 2019. FINDINGS: Cardiovascular: Limited opacification of cardiovascular structures in the setting of heart failure and anasarca. The heart size is moderate to markedly enlarged, similar to the prior study. Central pulmonary vasculature grossly normal on very limited assessment. Aortic caliber and contour appears grossly stable. Scattered atheromatous plaque in the thoracic aorta. Signs of body wall collaterals potentially related to venous stenosis or arm position along the RIGHT chest in this patient with RIGHT sided IJ dialysis catheter which terminates at the upper portion of the RIGHT atrium. Mediastinum/Nodes: Patulous esophagus. Mildly enlarged mediastinal lymph nodes largest 13 mm previously 12 mm (image 49/4)  limited assessment of the chest due to extensive anasarca. Post LEFT brachiocephalic, subclavian and axillary stenting partially imaged into the LEFT upper arm where there is a dialysis graft or fistula. Lungs/Pleura: Large LEFT pleural effusion increased from previous imaging associated with volume loss/airspace disease in the LEFT lung base. Some shift of mediastinal structures due to this large LEFT pleural effusion. RIGHT chest with atelectasis and scarring. Nodule seen on previous imaging are not visualized on the current study. Much of the LEFT lung is collapsed however limiting assessment of the LEFT lung. Tracheal deviation in the setting of large effusion. Airways to the RIGHT lung are patent. Upper Abdomen: Reflux of contrast into hepatic veins. Nodular liver contours with fissural widening. No acute upper abdominal process though there is large volume ascites. Musculoskeletal: Extensive body wall edema blurs fascial planes. Multifocal sclerotic bony lesions are most suggestive of prostate cancer  metastases and are more numerous than on the previous study involving bilateral ribs, sternum and numerous levels of the spine. Sternal lesions are new compared to previous imaging. RIGHT posterior sixth rib and seventh rib also new compared to previous imaging. Numerous new scattered foci of bony metastases seen in the LEFT ribs as well. No acute finding related to visualized clavicles or scapulae, enlarging LEFT scapular sclerotic lesion now measures 16 mm as compared to 4-5 mm on the prior exam. There are numerous new lesions throughout the spine as well, for instance posterior elements at T1, new lesion at T4 and new lesions in the T10 and T11 vertebral levels and subjacent T12, enlargement of existing lesions throughout. No acute bone finding. IMPRESSION: Anasarca, findings likely related to volume overload and or heart failure in this patient on dialysis. Extensive stenting of LEFT upper extremity extending into central venous structures not well assessed due to phase of contrast enhancement. Large LEFT pleural effusion, causing mediastinal shift, increased from previous imaging and associated with volume loss/airspace disease in the LEFT lung base LEFT lower. Effusion is partially loculated but without visible nodularity. Pulmonary nodules are not seen. Dense airspace disease at the LEFT lung base favored to represent rounded atelectasis. Pulmonary nodules not seen potentially related to infectious or inflammatory etiology. Findings that raise the question of liver disease. Worsening of osseous metastatic disease. Electronically Signed   By: Zetta Bills M.D.   On: 11/10/2020 14:23   IR THORACENTESIS ASP PLEURAL SPACE W/IMG GUIDE  Result Date: 11/09/2020 INDICATION: Patient with history of chronic AFib, ESRD on HD and CHF with left pleural effusion. Request was made for IR to perform diagnostic and therapeutic thoracentesis. EXAM: ULTRASOUND GUIDED DIAGNOSTIC AND THERAPEUTIC THORACENTESIS MEDICATIONS:  10 ML 1% LIDOCAINE COMPLICATIONS: None immediate. PROCEDURE: An ultrasound guided thoracentesis was thoroughly discussed with the patient and patient's family member and questions answered. The benefits, risks, alternatives and complications were also discussed. The patient understands and wishes to proceed with the procedure. Written consent was obtained. Ultrasound was performed to localize and mark an adequate pocket of fluid in the left chest. The area was then prepped and draped in the normal sterile fashion. 1% Lidocaine was used for local anesthesia. Under ultrasound guidance a 6 Fr Safe-T-Centesis catheter was introduced. Thoracentesis was performed. The catheter was removed and a dressing applied. FINDINGS: A total of approximately 1.3 L of serosanguineous fluid was removed. Samples were sent to the laboratory as requested by the clinical team. IMPRESSION: Successful ultrasound guided left thoracentesis yielding 1.3 L of pleural fluid. Read by: Narda Rutherford, NP Electronically Signed  By: Sandi Mariscal M.D.   On: 11/09/2020 16:39    Scheduled Meds:  aspirin  325 mg Oral Daily   atorvastatin  80 mg Oral QHS   Chlorhexidine Gluconate Cloth  6 each Topical Q0600   clopidogrel  75 mg Oral Daily   darbepoetin (ARANESP) injection - DIALYSIS  150 mcg Intravenous Q Wed-HD   docusate sodium  100 mg Oral Daily   feeding supplement (NEPRO CARB STEADY)  237 mL Oral BID BM   gabapentin  100 mg Oral Daily   And   gabapentin  200 mg Oral QHS   levETIRAcetam  500 mg Oral BID   lidocaine  1 application Topical Daily   midodrine  15 mg Oral TID WC   pantoprazole  20 mg Oral Daily   Continuous Infusions:   LOS: 8 days   Time spent: 38 minutes. More than 50% of the time was spent in counseling/coordination of care  Lorella Nimrod, MD [facility]  If 7PM-7AM, please contact night-coverage [email or web address]  11/11/2020, 2:05 PM   This record has been created using Systems analyst.  Errors have been sought and corrected,but may not always be located. Such creation errors do not reflect on the standard of care.

## 2020-11-11 NOTE — Progress Notes (Signed)
Palliative Medicine Inpatient Follow Up Note   Reason for Consultation: GOC   HPI/Patient Profile: 70 y.o. male  with past medical history of ESRD on HD, liver cirrhosis, CHF, metastatic prostate cancer, admitted on 11/03/2020 with shortness of breath. Workup revealed acute hypoxic respiratory failure due to L pleural effusion in the setting of missed dialysis. Admission and recovery have been complicated by hypotension. Palliative medicine consulted for [place].    Today's Discussion (11/10/2020):  *Please note that this is a verbal dictation therefore any spelling or grammatical errors are due to the "[place] One" system interpretation.  Chart reviewed.  Ayiden received dialysis yesterday though per conversation with Dr. Justin Mend it continues to be a difficult situation whereby his blood pressures decreased severely limiting the amount of fluid that they are able to remove.  Marisol is sleepy when I go by his room and not in a position to have a meaningful conversation.  I called patient's brother [name] in an attempt to set up a family meeting to further discuss Sergi's hypotensive episodes and intolerance to hemodialysis.  We had a very lengthy discussion regarding Kass's poor clinical state and the reality that continuation of dialysis is likely to continue to be fraught with complications.  [name] says he understands this though per his discussions with his brother in the past which have been rather candid will he has continued to want to receive dialysis.  I explained to [name] that unfortunately will he faces a situation where he may very well be at dialysis and intolerant of treatments causing him to decompensate and have an emergent event.  I shared that this is very worrisome.  [name] expresses that he continues to have open and honest dialogue with his brother regarding the situation at hand.  [name] shares with me that as long as his brother Geoffry says he wants to continue dialysis he  and his family will respect that.  I asked to understand more about Gianfranco's faith practices as [name] said "giving up is considered suicide".  [name] shares that they grew up in a strict Hol[employer] environment whereby they went to church every day.  [name] goes on to share a story about his mother who also was in a similar situation and did share after complex conversations with the nephrologist that she no longer wanted to continue treatment.  [name] says that this was something that the family excepted though was always regretful for as they wondered "could we have done more".  [name] is very stern regarding the fact that he he and his family will not relent from any treatments until Cahlil himself shares that his time.  He attest to the reality that Michial may not have great comprehension and tends to come in and out of it though per his understanding he had wanted to continue hemodialysis.  We reviewed that if it gets to a point where nephrology will not offer dialysis anymore then that is a situation where the end is very clear.  [name] goes on to tell me that either way Rasheem's life is going to and and he would much rather respect his wishes prior to that occurring.  In regards to [place] it is a similar situation whereby [name] has spoken at length to [place] and will he continues to want full code full scope of care treatment.  [name] seems to understand the risk associated with that in the trauma that will be caused to [place] body though again he wants to respect his  brother's wishes.  [name] was very respectful of my time and shared that he wishes he could tell me something different but until well he himself says differently regarding his care this will be continued.  [name] states that he does have the palliative care team's phone number if anything changes he would reach out to [place] personally.  Questions/concerns answered and palliative support provided  Objective Assessment: Vital  Signs Vitals:   11/11/20 0530 11/11/20 0600  BP: (!) 85/46 (!) 85/43  Pulse: 61 63  Resp: (!) 22   Temp:    SpO2: 92%     Intake/Output Summary (Last 24 hours) at 11/11/2020 6812 Last data filed at 11/10/2020 2100 Gross per 24 hour  Intake 960 ml  Output --  Net 960 ml   Last Weight  Most recent update: 11/11/2020  4:58 AM    Weight  88.2 kg (194 lb 7.1 oz)            Gen: Chronically ill-appearing African-American male in no acute distress HEENT: Dry mucous membranes CV: Regular rate and rhythm  PULM: 2LPM [place] ABD: soft/nontender  EXT: L BKA Neuro: Somnolent  SUMMARY OF RECOMMENDATIONS -Continue full code full scope -[place] will continue with hemodialysis for as long as it is offered -If it is determined that patient is not tolerating dialysis and it will no longer be offered then PMT will revisit [place] with [name] -Continue to follow with outpatient Palliative through [facility]   -The palliative medicine team will continue to shadow [place]'s chart  Discussed with Dr. Reesa Chew and Dr. Justin Mend  Time Spent: 40 Greater than 50% of the time was spent in counseling and coordination of care ______________________________________________________________________________________ [place] Team Team Cell Phone: [phone] Please utilize secure chat with additional questions, if there is no response within 30 minutes please call the above phone number  Palliative Medicine Team providers are available by phone from 7am to 7pm daily and can be reached through the team cell phone.  Should this patient require assistance outside of these hours, please call the patient's attending physician.

## 2020-11-12 ENCOUNTER — Inpatient Hospital Stay (HOSPITAL_COMMUNITY): Payer: Medicare Other

## 2020-11-12 DIAGNOSIS — Z7189 Other specified counseling: Secondary | ICD-10-CM | POA: Diagnosis not present

## 2020-11-12 DIAGNOSIS — Z515 Encounter for palliative care: Secondary | ICD-10-CM | POA: Diagnosis not present

## 2020-11-12 DIAGNOSIS — I959 Hypotension, unspecified: Secondary | ICD-10-CM | POA: Diagnosis not present

## 2020-11-12 LAB — BODY FLUID CELL COUNT WITH DIFFERENTIAL
Eos, Fluid: 2 %
Lymphs, Fluid: 31 %
Monocyte-Macrophage-Serous Fluid: 62 % (ref 50–90)
Neutrophil Count, Fluid: 5 % (ref 0–25)
Total Nucleated Cell Count, Fluid: 52 cu mm (ref 0–1000)

## 2020-11-12 LAB — RENAL FUNCTION PANEL
Albumin: 2.8 g/dL — ABNORMAL LOW (ref 3.5–5.0)
Anion gap: 13 (ref 5–15)
BUN: 28 mg/dL — ABNORMAL HIGH (ref 8–23)
CO2: 25 mmol/L (ref 22–32)
Calcium: 8.7 mg/dL — ABNORMAL LOW (ref 8.9–10.3)
Chloride: 97 mmol/L — ABNORMAL LOW (ref 98–111)
Creatinine, Ser: 3.87 mg/dL — ABNORMAL HIGH (ref 0.61–1.24)
GFR, Estimated: 16 mL/min — ABNORMAL LOW (ref >60)
Glucose, Bld: 143 mg/dL — ABNORMAL HIGH (ref 70–99)
Phosphorus: 3.1 mg/dL (ref 2.5–4.6)
Potassium: 4 mmol/L (ref 3.5–5.1)
Sodium: 135 mmol/L (ref 135–145)

## 2020-11-12 LAB — GRAM STAIN

## 2020-11-12 LAB — ALBUMIN, PLEURAL OR PERITONEAL FLUID: Albumin, Fluid: 1.7 g/dL

## 2020-11-12 LAB — BODY FLUID CULTURE W GRAM STAIN: Culture: NO GROWTH

## 2020-11-12 MED ORDER — LIDOCAINE HCL (PF) 1 % IJ SOLN
INTRAMUSCULAR | Status: AC
  Filled 2020-11-12: qty 30

## 2020-11-12 NOTE — Plan of Care (Signed)
  Problem: Education: Goal: Knowledge of General Education information will improve Description: Including pain rating scale, medication(s)/side effects and non-pharmacologic comfort measures Outcome: Progressing   Problem: Health Behavior/Discharge Planning: Goal: Ability to manage health-related needs will improve Outcome: Progressing   Problem: Clinical Measurements: Goal: Ability to maintain clinical measurements within normal limits will improve Outcome: Progressing Goal: Will remain free from infection Outcome: Progressing Goal: Diagnostic test results will improve Outcome: Progressing Goal: Respiratory complications will improve Outcome: Progressing Goal: Cardiovascular complication will be avoided Outcome: Progressing   Problem: Activity: Goal: Risk for activity intolerance will decrease Outcome: Progressing   Problem: Nutrition: Goal: Adequate nutrition will be maintained Outcome: Progressing   Problem: Coping: Goal: Level of anxiety will decrease Outcome: Progressing   Problem: Elimination: Goal: Will not experience complications related to bowel motility Outcome: Progressing   Problem: Pain Managment: Goal: General experience of comfort will improve Outcome: Progressing   Problem: Safety: Goal: Ability to remain free from injury will improve Outcome: Progressing   Problem: Skin Integrity: Goal: Risk for impaired skin integrity will decrease Outcome: Progressing   Problem: Fluid Volume: Goal: Compliance with measures to maintain balanced fluid volume will improve Outcome: Progressing   Problem: Clinical Measurements: Goal: Complications related to the disease process, condition or treatment will be avoided or minimized Outcome: Progressing

## 2020-11-12 NOTE — Procedures (Signed)
PROCEDURE SUMMARY:  Successful US guided paracentesis from right lateral abdomen.  Yielded 3.6 liters of clear yellow fluid.  No immediate complications.  Patient tolerated well.  EBL = trace  Specimen was sent for labs.  Judie Grieve Ludwika Rodd PA-C 11/12/2020 12:42 PM

## 2020-11-12 NOTE — Progress Notes (Addendum)
Palliative Medicine Inpatient Follow Up Note  Reason for Consultation: George Burgess   HPI/Patient Profile: 70 y.o. male  with past medical history of ESRD on HD, liver cirrhosis, CHF, metastatic prostate cancer, admitted on 11/03/2020 with shortness of breath. Workup revealed acute hypoxic respiratory failure due to L pleural effusion in the setting of missed dialysis. Admission and recovery have been complicated by hypotension. Palliative medicine consulted for [place].    Today's Discussion (11/12/2020):  *Please note that this is a verbal dictation therefore any spelling or grammatical errors are due to the "[place] One" system interpretation.  Chart reviewed.   I met with George Burgess at bedside in the presence of his RN, [name].  He was a little bit altered and perseverant on the fact that he was sitting up.  He continued to tell me that he would like to lie down so that he may be more comfortable.  We reviewed that he has quite a lot of swelling in his abdomen in the setting of ascites.  This causes him to feel generally uncomfortable and short of breath when in an upright position.  [name] shared with him the importance of sitting up to take his medications which she did obliged to.  I spoke to [name] about his thoughts pertaining to his ongoing dialysis treatments.  I asked him how he thought things were going.  He said "things going well".  I shared with him that I am very concerned that he is developing more of an intolerance to dialysis and that over time he will get to a point where dialysis will likely no longer be able to be offered.  I shared that at that time we may have to talk more about the options from there inclusive of his CODE STATUS.  He made a few statements which indicated that he had somewhat of a realization as to the severity of his situation though throughout our conversation he seemed to oscillate quite a bit.  I asked him if I could call his brother George Burgess to further discuss this  which she was amenable to  I called patient's brother George Burgess and explained to him the conversation I had with George Burgess though it was difficult because George Burgess was oscillatory in regards to what his wishes would be.  George Burgess shares that this has been ongoing and this is what is the most frustrating component of helping to make decisions for him.  We will George Burgess shares as it stands today he would like to continue with the present interventions and measures per his brother's wishes though he does realize we are getting to a point where will he will not be able to tolerate dialysis and where topic such as CODE STATUS and where to go from here will need further thought and consideration.  George Burgess shares that he will not be able to come into meet at bedside today though he will be available tomorrow evening after the hours of 5:30 PM.  I explained to George Burgess typically a palliative care member of the team is not here though I did share our number should he have any additional questions or concerns moving forward.  Questions/concerns answered and palliative support provided  Objective Assessment: Vital Signs Vitals:   11/12/20 1040 11/12/20 1221  BP: (!) 80/52 (!) 84/52  Pulse:  90  Resp:  18  Temp:  98.3 F (36.8 C)  SpO2:  100%    Intake/Output Summary (Last 24 hours) at 11/12/2020 1453 Last data filed at 11/12/2020 [phone]  Gross per 24 hour  Intake 160 ml  Output --  Net 160 ml    Last Weight  Most recent update: 11/12/2020  5:07 AM    Weight  88.8 kg (195 lb 12.3 oz)            Gen: Chronically ill-appearing African-American male in no acute distress HEENT: Dry mucous membranes CV: Regular rate and rhythm  PULM: 2LPM [place] ABD: soft/nontender  EXT: L BKA Neuro: Alert and oriented x1, disoriented at times  SUMMARY OF RECOMMENDATIONS -Continue full code full scope  -[place] will continue with hemodialysis for as long as it is offered  -If it is determined that patient is not tolerating  dialysis and it will no longer be offered then PMT will revisit [place] with George Burgess  -Continue to follow with outpatient Palliative through [facility]    -The palliative medicine team will continue to shadow [name]'s chart  Time Spent: 50 Greater than 50% of the time was spent in counseling and coordination of care ______________________________________________________________________________________ [place] Team Team Cell Phone: [phone] Please utilize secure chat with additional questions, if there is no response within 30 minutes please call the above phone number  Palliative Medicine Team providers are available by phone from 7am to 7pm daily and can be reached through the team cell phone.  Should this patient require assistance outside of these hours, please call the patient's attending physician.

## 2020-11-12 NOTE — Progress Notes (Signed)
PROGRESS NOTE    George Burgess  SFK:[id number] DOB: 11-21-1950 DOA: 11/03/2020 PCP: Caprice Renshaw, MD   Brief Narrative: Taken from prior notes. 70 year old man complicated PMH including ESRD, chronic hypotension, chronic combined CHF, cirrhosis, chronic hypoxic respiratory failure, metastatic prostate cancer, presenting with shortness of breath.  Admitted for acute on chronic hypoxic respiratory failure, initially thought secondary to pleural effusion, however patient remains at baseline oxygen requirement despite presence of effusion.  Condition complicated by recently missed dialysis session.  He appears clinically stable with chronic hypotension, however he has not successfully dialyzed during hospitalization, no volume was able to be removed 9/19.  Patient had another dialysis today-blood pressure becoming soft even with albumin.  Unable to do much volume removal due to softer blood pressure. Thoracentesis was ordered. Palliative care was consulted-patient and family decided to remain full code with full scope of medical care.  If continue not to tolerate dialysis then palliative care needs to revisit and go to hospice home.  Unable to complete dialysis due to softer blood pressure.  Family still wants full scope of care.  Unable to remove extra fluid.  Thoracentesis ordered but doubt that will help. Patient is very high risk for deterioration and death-appropriate for comfort measures only.  9/23: Left thoracentesis with removal of 1.2 L of fluid, unable to take more fluid off as patient started coughing and becoming uncomfortable.  Initial labs seems exudative but cultures so far negative. CT chest was ordered because of his history of metastatic prostate cancer, that did not show lung lesions but did show worsening of osseous metastasis and liver nodularity.  Large volume ascites.  9/24: Unable to get dialysis yesterday, received earlier today, able to take about 600 cc of fluid, appears  very lethargic.  Still wants to continue everything.  9/25: Paracentesis was done with removal of 3.6 L of peritoneal fluid for comfort reasons.  Preliminary lab seems transudate, likely secondary to ascites.  Subjective: Patient was seen after paracentesis today, feels little relieved as abdomen was soft.  Assessment & Plan:   Principal Problem:   Hypotension Active Problems:   Seizure ([place])   ESRD on hemodialysis (HCC)   Atrial fibrillation, chronic   Anemia due to chronic kidney disease   Dementia (HCC)   Chronic combined systolic (congestive) and diastolic (congestive) heart failure (HCC)   PVD (peripheral vascular disease) (HCC)   CAD (coronary artery disease)   Prostate cancer, primary, with metastasis from prostate to other site [facility])   Type 2 diabetes mellitus with chronic kidney disease on chronic dialysis ([place])   Cirrhosis of liver with ascites ([place])   Pleural effusion on left   Pressure injury of skin   Chronic respiratory failure with hypoxia (HCC)   Hx of BKA (HCC)  Hypotension.  Blood pressure remained soft.  He is not a candidate for CRRT per nephrology.  Patient did have about 600 cc fluid taken off during dialysis yesterday. -Continue with midodrine -Might not be able to tolerate dialysis as unable to remove much fluid. -Currently wants to remain full code with full scope of care. -Patient is appropriate for comfort measures only but family does not agree with that. -Appreciate palliative care help-a family meeting with nephrology might be helpful.    Recurrent left pleural effusion.  Currently stable on home oxygen requirement of 2 L. -Left-sided thoracentesis-removal of 1.2 L of exudative fluid, preliminary cultures negative.  Unable to tolerate more removal, postprocedural chest x-ray with significant remaining pleural effusion. CT chest  with large bilateral pleural effusions, with basal atelectasis, large volume ascites and liver nodularity.  No pulmonary  nodule noted but there is an extension of osseous metastatic disease. Seems more consistent with heart failure and ESRD as we are unable to remove fluid due to softer blood pressure.  Cirrhosis of liver with ascites.  Imaging with anasarca and large volume ascites. Paracentesis was done today with removal of 3.6 L of fluid which resulted in some improvement of his discomfort.  Preliminary lab seems transudative. -Continue to monitor -high risk for reaccumulation.  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 --monitor   Chronic combined systolic (congestive) and diastolic (congestive) heart failure (HCC)     -Worsening anasarca and fluid retention as unable to remove fluid with dialysis due to hypotension. -- continue to hold Toprol given BP; not on diuretic (ESRD) --Last EF noted to be around 20-25% with left ventricular global hypokinesis and moderate pulmonary hypertension at [facility] in 06/2020.   ESRD on hemodialysis [facility]) --nephrology consultation, in past noted to not be a candidate for CRRT. Previous HD limited by chronic hypotension. -Patient desired to continue despite worsening volume status and unable to tolerate dialysis.   Chronic respiratory failure with hypoxia (HCC) --stable. Continue supplemental oxygen.   Dementia ([place]) --appears stable. Not on any outpt meds. Delirium precautions.   Atrial fibrillation, chronic --stable. Toprol on hold for low BP.  Felt to be a poor candidate for anticoagulation by cardiology in the past.   Seizure ([place]) --stable, continue Keppra, gabapentin   Prostate cancer, primary, with metastasis from prostate to other site [facility]) --followed by urology, treated w/ [facility] Koller. -Repeat imaging with worsening of metastatic disease.   CAD (coronary artery disease) --asymptomatic, continue aspirin, statin   PVD (peripheral vascular disease) (HCC) --s/p BKA. Stable.    Anemia due  to chronic kidney disease --stable -Continue with Aranesp    Objective: Vitals:   11/12/20 1040 11/12/20 1221 11/12/20 1557 11/12/20 1619  BP: (!) 80/52 (!) 84/52 (!) 76/56 (!) 77/52  Pulse:  90 78   Resp:  18 17   Temp:  98.3 F (36.8 C) 98.1 F (36.7 C)   TempSrc:  Oral Axillary   SpO2:  100% 97%   Weight:        Intake/Output Summary (Last 24 hours) at 11/12/2020 1629 Last data filed at 11/12/2020 0820 Gross per 24 hour  Intake 160 ml  Output --  Net 160 ml    Filed Weights   11/11/20 0458 11/11/20 0910 11/12/20 0500  Weight: 88.2 kg 87.6 kg 88.8 kg    Examination:  General.  Ill-appearing gentleman, in no acute distress. Pulmonary.  Decreased breath sound at bases, normal respiratory effort. CV.  Regular rate and rhythm, no JVD, rub or murmur. Abdomen.  Soft, nontender, nondistended, BS positive. CNS.  Alert and oriented.  No focal neurologic deficit. Extremities.  No edema, no cyanosis, pulses intact and symmetrical. Psychiatry.  Judgment and insight appears impaired.  DVT prophylaxis: Heparin Code Status: Full Family Communication: Discussed with brother on phone Disposition Plan:  Status is: Inpatient  Remains inpatient appropriate because:Inpatient level of care appropriate due to severity of illness  Dispo: The patient is from: Home              Anticipated d/c is to:  To be determined              Patient currently is not medically  stable to d/c.   Difficult to place patient No               Level of care: Progressive  All the records are reviewed and case discussed with Care Management/Social Worker. Management plans discussed with the patient, nursing and they are in agreement.  Consultants:  Nephrology Palliative care  Procedures:  Antimicrobials:   Data Reviewed: I have personally reviewed following labs and imaging studies  CBC: Recent Labs  Lab 11/06/20 1157 11/11/20 0430  WBC 6.3 6.7  HGB 8.0* 8.3*  HCT 24.2* 25.7*  MCV 96.0  97.3  PLT 201 081    Basic Metabolic Panel: Recent Labs  Lab 11/09/20 0130 11/10/20 0109 11/11/20 0112 11/11/20 0430 11/12/20 0101  NA 134* 133* 132* 133* 135  K 3.9 4.0 4.4 4.3 4.0  CL 98 95* 95* 96* 97*  CO2 25 28 27 27 25   GLUCOSE 138* 138* 129* 138* 143*  BUN 25* 32* 37* 39* 28*  CREATININE 3.66* 4.42* 4.85* 4.99* 3.87*  CALCIUM 8.7* 8.5* 8.5* 8.6* 8.7*  PHOS 3.5 4.0 4.3 4.3 3.1    GFR: CrCl cannot be calculated (Unknown ideal weight.). Liver Function Tests: Recent Labs  Lab 11/09/20 0130 11/10/20 0109 11/11/20 0112 11/11/20 0430 11/12/20 0101  ALBUMIN 2.6* 2.4* 2.4* 2.3* 2.8*    No results for input(s): LIPASE, AMYLASE in the last 168 hours. No results for input(s): AMMONIA in the last 168 hours. Coagulation Profile: No results for input(s): INR, PROTIME in the last 168 hours. Cardiac Enzymes: No results for input(s): CKTOTAL, CKMB, CKMBINDEX, TROPONINI in the last 168 hours. BNP (last 3 results) No results for input(s): PROBNP in the last 8760 hours. HbA1C: No results for input(s): HGBA1C in the last 72 hours. CBG: No results for input(s): GLUCAP in the last 168 hours.  Lipid Profile: No results for input(s): CHOL, HDL, LDLCALC, TRIG, CHOLHDL, LDLDIRECT in the last 72 hours. Thyroid Function Tests: No results for input(s): TSH, T4TOTAL, FREET4, T3FREE, THYROIDAB in the last 72 hours. Anemia Panel: No results for input(s): VITAMINB12, FOLATE, FERRITIN, TIBC, IRON, RETICCTPCT in the last 72 hours. Sepsis Labs: No results for input(s): PROCALCITON, LATICACIDVEN in the last 168 hours.   Recent Results (from the past 240 hour(s))  Resp Panel by RT-PCR (Flu A&B, Covid) Nasopharyngeal Swab     Status: None   Collection Time: 11/03/20  3:24 PM   Specimen: Nasopharyngeal Swab; Nasopharyngeal(NP) swabs in vial transport medium  Result Value Ref Range Status   SARS Coronavirus 2 by RT PCR NEGATIVE NEGATIVE Final    Comment: (NOTE) SARS-CoV-2 target nucleic  acids are NOT DETECTED.  The SARS-CoV-2 RNA is generally detectable in upper respiratory specimens during the acute phase of infection. The lowest concentration of SARS-CoV-2 viral copies this assay can detect is 138 copies/mL. A negative result does not preclude SARS-Cov-2 infection and should not be used as the sole basis for treatment or other patient management decisions. A negative result may occur with  improper specimen collection/handling, submission of specimen other than nasopharyngeal swab, presence of viral mutation(s) within the areas targeted by this assay, and inadequate number of viral copies(<138 copies/mL). A negative result must be combined with clinical observations, patient history, and epidemiological information. The expected result is Negative.  Fact Sheet for Patients:  EntrepreneurPulse.com.au  Fact Sheet for Healthcare Providers:  IncredibleEmployment.be  This test is no t yet approved or cleared by the [place] FDA and  has been authorized for detection and/or diagnosis  of SARS-CoV-2 by FDA under an Emergency Use Authorization (EUA). This EUA will remain  in effect (meaning this test can be used) for the duration of the COVID-19 declaration under Section 564(b)(1) of the Act, 21 U.S.C.section 360bbb-3(b)(1), unless the authorization is terminated  or revoked sooner.       Influenza A by PCR NEGATIVE NEGATIVE Final   Influenza B by PCR NEGATIVE NEGATIVE Final    Comment: (NOTE) The Xpert Xpress SARS-CoV-2/FLU/RSV plus assay is intended as an aid in the diagnosis of influenza from Nasopharyngeal swab specimens and should not be used as a sole basis for treatment. Nasal washings and aspirates are unacceptable for Xpert Xpress SARS-CoV-2/FLU/RSV testing.  Fact Sheet for Patients: EntrepreneurPulse.com.au  Fact Sheet for Healthcare Providers: IncredibleEmployment.be  This  test is not yet approved or cleared by the [place] FDA and has been authorized for detection and/or diagnosis of SARS-CoV-2 by FDA under an Emergency Use Authorization (EUA). This EUA will remain in effect (meaning this test can be used) for the duration of the COVID-19 declaration under Section 564(b)(1) of the Act, 21 U.S.C. section 360bbb-3(b)(1), unless the authorization is terminated or revoked.  Performed at [facility], [place] [address], [place], [place] [address]   Body fluid culture w Gram Stain     Status: None (Preliminary result)   Collection Time: 11/09/20  4:07 PM   Specimen: Lung, Left; Pleural Fluid  Result Value Ref Range Status   Specimen Description FLUID PLEURAL LEFT  Final   Special Requests NONE  Final   Gram Stain   Final    FEW WBC PRESENT, PREDOMINANTLY MONONUCLEAR NO ORGANISMS SEEN    Culture   Final    NO GROWTH 3 DAYS Performed at [facility], [place] [address] [place], [place] [address]    Report Status PENDING  Incomplete      Radiology Studies: US Paracentesis  Result Date: 11/12/2020 INDICATION: Cirrhosis. Symptomatic ascites. Request for diagnostic and therapeutic paracentesis EXAM: ULTRASOUND GUIDED PARACENTESIS MEDICATIONS: 1% lidocaine 10 mL COMPLICATIONS: None immediate. PROCEDURE: Informed written consent was obtained from the patient after a discussion of the risks, benefits and alternatives to treatment. A timeout was performed prior to the initiation of the procedure. Initial ultrasound scanning demonstrates a large amount of ascites within the right lateral abdomen. The right lateral abdomen was prepped and draped in the usual sterile fashion. 1% lidocaine was used for local anesthesia. Following this, a 19 gauge, 7-cm, Yueh catheter was introduced. An ultrasound image was saved for documentation purposes. The paracentesis was performed. The catheter was removed and a dressing was applied. The patient tolerated the procedure  well without immediate post procedural complication. FINDINGS: A total of approximately 3.6 L of clear yellow fluid was removed. Samples were sent to the laboratory as requested by the clinical team. IMPRESSION: Successful ultrasound-guided paracentesis yielding 3.6 liters of peritoneal fluid. Read by: Gareth Eagle, PA-C Electronically Signed   By: Sandi Mariscal M.D.   On: 11/12/2020 12:41    Scheduled Meds:  aspirin  325 mg Oral Daily   atorvastatin  80 mg Oral QHS   Chlorhexidine Gluconate Cloth  6 each Topical Q0600   clopidogrel  75 mg Oral Daily   darbepoetin (ARANESP) injection - DIALYSIS  150 mcg Intravenous Q Wed-HD   docusate sodium  100 mg Oral Daily   feeding supplement (NEPRO CARB STEADY)  237 mL Oral BID BM   gabapentin  100 mg Oral Daily   And  gabapentin  200 mg Oral QHS   levETIRAcetam  500 mg Oral BID   lidocaine  1 application Topical Daily   midodrine  15 mg Oral TID WC   pantoprazole  20 mg Oral Daily   Continuous Infusions:   LOS: 9 days   Time spent: 35 minutes. More than 50% of the time was spent in counseling/coordination of care  Lorella Nimrod, MD [facility]  If 7PM-7AM, please contact night-coverage [email or web address]  11/12/2020, 4:29 PM   This record has been created using Systems analyst. Errors have been sought and corrected,but may not always be located. Such creation errors do not reflect on the standard of care.

## 2020-11-12 NOTE — Progress Notes (Signed)
[facility] ROUNDING NOTE   Subjective:   Interval History: 70 year old gentleman with history end-stage renal disease chronic hypotension combined congestive heart failure.  History of cirrhosis hypoxic respiratory failure metastatic prostate cancer.  He received his regular dialysis treatment at [facility] in [place].  He was admitted with hypotension and dyspnea.  Blood pressure 104/56 pulse 66 temperature 97.9 O2 sats 90% 3 L nasal cannula unfortunately dialysis was limited by hypotension.  Unable to remove fluid.  Palliative medicine assisting.  Received dialysis with 614 cc removed 11/11/2020.  Next dialysis treatment be 11/14/2020.  Ongoing discussions with family.  Patient wishes to continue dialysis despite risks  Sodium 135 potassium 4 chloride 97 CO2 25 BUN 28 creatinine 3.87 glucose 143 calcium 8.7 hemoglobin 8.3  Objective:  Vital signs in last 24 hours:  Temp:  [97.2 F (36.2 C)-99.4 F (37.4 C)] 97.9 F (36.6 C) (09/25 0800) Pulse Rate:  [76-100] 100 (09/25 0800) Resp:  [17-21] 18 (09/25 0800) BP: (84-94)/(56-64) 94/64 (09/25 0800) SpO2:  [92 %-100 %] 98 % (09/25 0800) Weight:  [88.8 kg] 88.8 kg (09/25 0500)  Weight change: -0.6 kg Filed Weights   11/11/20 0458 11/11/20 0910 11/12/20 0500  Weight: 88.2 kg 87.6 kg 88.8 kg    Intake/Output: I/O last 3 completed shifts: In: 220 [P.O.:220] Out: 66 [Other:614]   Intake/Output this shift:  Total I/O In: 75 [P.O.:60] Out: -  CLE:XNTZGYFVCBS ill appearing, laying flat in bed CVS:rrr Resp:decreased air entry left fields, normal WOB, on 2LNC WHQ:PRFFMBWGY, soft, nt KZL:DJTT bka, very trace edema rt LE Neuro: awake HD access: RIJ TDC, LUE    Basic Metabolic Panel: Recent Labs  Lab 11/09/20 0130 11/10/20 0109 11/11/20 0112 11/11/20 0430 11/12/20 0101  NA 134* 133* 132* 133* 135  K 3.9 4.0 4.4 4.3 4.0  CL 98 95* 95* 96* 97*  CO2 25 28 27 27 25   GLUCOSE 138* 138* 129* 138*  143*  BUN 25* 32* 37* 39* 28*  CREATININE 3.66* 4.42* 4.85* 4.99* 3.87*  CALCIUM 8.7* 8.5* 8.5* 8.6* 8.7*  PHOS 3.5 4.0 4.3 4.3 3.1     Liver Function Tests: Recent Labs  Lab 11/09/20 0130 11/10/20 0109 11/11/20 0112 11/11/20 0430 11/12/20 0101  ALBUMIN 2.6* 2.4* 2.4* 2.3* 2.8*    No results for input(s): LIPASE, AMYLASE in the last 168 hours. No results for input(s): AMMONIA in the last 168 hours.  CBC: Recent Labs  Lab 11/06/20 1157 11/11/20 0430  WBC 6.3 6.7  HGB 8.0* 8.3*  HCT 24.2* 25.7*  MCV 96.0 97.3  PLT 201 199     Cardiac Enzymes: No results for input(s): CKTOTAL, CKMB, CKMBINDEX, TROPONINI in the last 168 hours.  BNP: Invalid input(s): POCBNP  CBG: No results for input(s): GLUCAP in the last 168 hours.   Microbiology: Results for orders placed or performed during the hospital encounter of 11/03/20  Resp Panel by RT-PCR (Flu A&B, Covid) Nasopharyngeal Swab     Status: None   Collection Time: 11/03/20  3:24 PM   Specimen: Nasopharyngeal Swab; Nasopharyngeal(NP) swabs in vial transport medium  Result Value Ref Range Status   SARS Coronavirus 2 by RT PCR NEGATIVE NEGATIVE Final    Comment: (NOTE) SARS-CoV-2 target nucleic acids are NOT DETECTED.  The SARS-CoV-2 RNA is generally detectable in upper respiratory specimens during the acute phase of infection. The lowest concentration of SARS-CoV-2 viral copies this assay can detect is 138 copies/mL. A negative result does not preclude SARS-Cov-2 infection and should  not be used as the sole basis for treatment or other patient management decisions. A negative result may occur with  improper specimen collection/handling, submission of specimen other than nasopharyngeal swab, presence of viral mutation(s) within the areas targeted by this assay, and inadequate number of viral copies(<138 copies/mL). A negative result must be combined with clinical observations, patient history, and  epidemiological information. The expected result is Negative.  Fact Sheet for Patients:  EntrepreneurPulse.com.au  Fact Sheet for Healthcare Providers:  IncredibleEmployment.be  This test is no t yet approved or cleared by the [place] FDA and  has been authorized for detection and/or diagnosis of SARS-CoV-2 by FDA under an Emergency Use Authorization (EUA). This EUA will remain  in effect (meaning this test can be used) for the duration of the COVID-19 declaration under Section 564(b)(1) of the Act, 21 U.S.C.section 360bbb-3(b)(1), unless the authorization is terminated  or revoked sooner.       Influenza A by PCR NEGATIVE NEGATIVE Final   Influenza B by PCR NEGATIVE NEGATIVE Final    Comment: (NOTE) The Xpert Xpress SARS-CoV-2/FLU/RSV plus assay is intended as an aid in the diagnosis of influenza from Nasopharyngeal swab specimens and should not be used as a sole basis for treatment. Nasal washings and aspirates are unacceptable for Xpert Xpress SARS-CoV-2/FLU/RSV testing.  Fact Sheet for Patients: EntrepreneurPulse.com.au  Fact Sheet for Healthcare Providers: IncredibleEmployment.be  This test is not yet approved or cleared by the [place] FDA and has been authorized for detection and/or diagnosis of SARS-CoV-2 by FDA under an Emergency Use Authorization (EUA). This EUA will remain in effect (meaning this test can be used) for the duration of the COVID-19 declaration under Section 564(b)(1) of the Act, 21 U.S.C. section 360bbb-3(b)(1), unless the authorization is terminated or revoked.  Performed at [facility], [place] [address], [place], [place] [address]   Body fluid culture w Gram Stain     Status: None (Preliminary result)   Collection Time: 11/09/20  4:07 PM   Specimen: Lung, Left; Pleural Fluid  Result Value Ref Range Status   Specimen Description FLUID PLEURAL LEFT  Final    Special Requests NONE  Final   Gram Stain   Final    FEW WBC PRESENT, PREDOMINANTLY MONONUCLEAR NO ORGANISMS SEEN    Culture   Final    NO GROWTH 3 DAYS Performed at [facility], [place] [address], [place], [place] [address]    Report Status PENDING  Incomplete    Coagulation Studies: No results for input(s): LABPROT, INR in the last 72 hours.  Urinalysis: No results for input(s): COLORURINE, LABSPEC, PHURINE, GLUCOSEU, HGBUR, BILIRUBINUR, KETONESUR, PROTEINUR, UROBILINOGEN, NITRITE, LEUKOCYTESUR in the last 72 hours.  Invalid input(s): APPERANCEUR    Imaging: CT CHEST W CONTRAST  Result Date: 11/10/2020 CLINICAL DATA:  Pleural effusion, malignancy suspected in a 70 year old male with history of end-stage renal disease and diabetes with chronic CHF and reported history of metastatic prostate neoplasm. EXAM: CT CHEST WITH CONTRAST TECHNIQUE: Multidetector CT imaging of the chest was performed during intravenous contrast administration. CONTRAST:  22mL OMNIPAQUE IOHEXOL 300 MG/ML  SOLN COMPARISON:  July 20, 2019. FINDINGS: Cardiovascular: Limited opacification of cardiovascular structures in the setting of heart failure and anasarca. The heart size is moderate to markedly enlarged, similar to the prior study. Central pulmonary vasculature grossly normal on very limited assessment. Aortic caliber and contour appears grossly stable. Scattered atheromatous plaque in the thoracic aorta. Signs of body wall collaterals potentially related  to venous stenosis or arm position along the RIGHT chest in this patient with RIGHT sided IJ dialysis catheter which terminates at the upper portion of the RIGHT atrium. Mediastinum/Nodes: Patulous esophagus. Mildly enlarged mediastinal lymph nodes largest 13 mm previously 12 mm (image 49/4) limited assessment of the chest due to extensive anasarca. Post LEFT brachiocephalic, subclavian and axillary stenting partially imaged into the LEFT upper arm where there is  a dialysis graft or fistula. Lungs/Pleura: Large LEFT pleural effusion increased from previous imaging associated with volume loss/airspace disease in the LEFT lung base. Some shift of mediastinal structures due to this large LEFT pleural effusion. RIGHT chest with atelectasis and scarring. Nodule seen on previous imaging are not visualized on the current study. Much of the LEFT lung is collapsed however limiting assessment of the LEFT lung. Tracheal deviation in the setting of large effusion. Airways to the RIGHT lung are patent. Upper Abdomen: Reflux of contrast into hepatic veins. Nodular liver contours with fissural widening. No acute upper abdominal process though there is large volume ascites. Musculoskeletal: Extensive body wall edema blurs fascial planes. Multifocal sclerotic bony lesions are most suggestive of prostate cancer metastases and are more numerous than on the previous study involving bilateral ribs, sternum and numerous levels of the spine. Sternal lesions are new compared to previous imaging. RIGHT posterior sixth rib and seventh rib also new compared to previous imaging. Numerous new scattered foci of bony metastases seen in the LEFT ribs as well. No acute finding related to visualized clavicles or scapulae, enlarging LEFT scapular sclerotic lesion now measures 16 mm as compared to 4-5 mm on the prior exam. There are numerous new lesions throughout the spine as well, for instance posterior elements at T1, new lesion at T4 and new lesions in the T10 and T11 vertebral levels and subjacent T12, enlargement of existing lesions throughout. No acute bone finding. IMPRESSION: Anasarca, findings likely related to volume overload and or heart failure in this patient on dialysis. Extensive stenting of LEFT upper extremity extending into central venous structures not well assessed due to phase of contrast enhancement. Large LEFT pleural effusion, causing mediastinal shift, increased from previous imaging  and associated with volume loss/airspace disease in the LEFT lung base LEFT lower. Effusion is partially loculated but without visible nodularity. Pulmonary nodules are not seen. Dense airspace disease at the LEFT lung base favored to represent rounded atelectasis. Pulmonary nodules not seen potentially related to infectious or inflammatory etiology. Findings that raise the question of liver disease. Worsening of osseous metastatic disease. Electronically Signed   By: Zetta Bills M.D.   On: 11/10/2020 14:23     Medications:       aspirin  325 mg Oral Daily   atorvastatin  80 mg Oral QHS   Chlorhexidine Gluconate Cloth  6 each Topical Q0600   clopidogrel  75 mg Oral Daily   darbepoetin (ARANESP) injection - DIALYSIS  150 mcg Intravenous Q Wed-HD   docusate sodium  100 mg Oral Daily   feeding supplement (NEPRO CARB STEADY)  237 mL Oral BID BM   gabapentin  100 mg Oral Daily   And   gabapentin  200 mg Oral QHS   levETIRAcetam  500 mg Oral BID   lidocaine  1 application Topical Daily   midodrine  15 mg Oral TID WC   pantoprazole  20 mg Oral Daily   acetaminophen **OR** acetaminophen, albuterol, ondansetron **OR** ondansetron (ZOFRAN) IV  Assessment/ Plan:  # ESRD:  -outpatient orders: [facility],  MWF, EDW 75 kg, left upper extremity AVG (also has TDC), 4 hours, 400/600, 3K, 2.5 calcium, heparin 3800unit bolus (no maintenance) Patient received dialysis 11/11/2020 his next dialysis 11/14/2020.  He is off schedule due to high dialysis volumes/census   #Pleural effusion, left -  thora;per primary, HD will likely not be able to fix this   #Cirrhosis w/ ascites - para, per primary   # Volume/ chronic hypotension: EDW 75kg.  Ultrafiltration limited by hypotension.  Currently receiving 15 mg of midodrine 3 times daily   # Anemia of Chronic Kidney Disease: Drop in hemoglobin 8.0.  ESA added   # Secondary Hyperparathyroidism/Hyperphosphatemia: Appears to be in goal.   # Vascular  access: has LUE AVG that is being cannulated make arrangements for [facility] to be removed next week   LOS: [place] @TODAY @9 :29 AM

## 2020-11-13 DIAGNOSIS — I959 Hypotension, unspecified: Secondary | ICD-10-CM | POA: Diagnosis not present

## 2020-11-13 LAB — RENAL FUNCTION PANEL
Albumin: 2.6 g/dL — ABNORMAL LOW (ref 3.5–5.0)
Anion gap: 11 (ref 5–15)
BUN: 36 mg/dL — ABNORMAL HIGH (ref 8–23)
CO2: 26 mmol/L (ref 22–32)
Calcium: 8.5 mg/dL — ABNORMAL LOW (ref 8.9–10.3)
Chloride: 96 mmol/L — ABNORMAL LOW (ref 98–111)
Creatinine, Ser: 4.77 mg/dL — ABNORMAL HIGH (ref 0.61–1.24)
GFR, Estimated: 12 mL/min — ABNORMAL LOW (ref >60)
Glucose, Bld: 126 mg/dL — ABNORMAL HIGH (ref 70–99)
Phosphorus: 3.8 mg/dL (ref 2.5–4.6)
Potassium: 3.9 mmol/L (ref 3.5–5.1)
Sodium: 133 mmol/L — ABNORMAL LOW (ref 135–145)

## 2020-11-13 LAB — CYTOLOGY - NON PAP

## 2020-11-13 NOTE — Progress Notes (Signed)
PROGRESS NOTE    George Burgess  PPJ:[id number] DOB: 11/30/1950 DOA: 11/03/2020 PCP: Caprice Renshaw, MD   Brief Narrative: Taken from prior notes. 70 year old man complicated PMH including ESRD, chronic hypotension, chronic combined CHF, cirrhosis, chronic hypoxic respiratory failure, metastatic prostate cancer, presenting with shortness of breath.  Admitted for acute on chronic hypoxic respiratory failure, initially thought secondary to pleural effusion, however patient remains at baseline oxygen requirement despite presence of effusion.  Condition complicated by recently missed dialysis session.  He appears clinically stable with chronic hypotension, however he has not successfully dialyzed during hospitalization, no volume was able to be removed 9/19.  Patient had another dialysis today-blood pressure becoming soft even with albumin.  Unable to do much volume removal due to softer blood pressure. Thoracentesis was ordered. Palliative care was consulted-patient and family decided to remain full code with full scope of medical care.  If continue not to tolerate dialysis then palliative care needs to revisit and go to hospice home.  Unable to complete dialysis due to softer blood pressure.  Family still wants full scope of care.  Unable to remove extra fluid.  Thoracentesis ordered but doubt that will help. Patient is very high risk for deterioration and death-appropriate for comfort measures only.  9/23: Left thoracentesis with removal of 1.2 L of fluid, unable to take more fluid off as patient started coughing and becoming uncomfortable.  Initial labs seems exudative but cultures so far negative. CT chest was ordered because of his history of metastatic prostate cancer, that did not show lung lesions but did show worsening of osseous metastasis and liver nodularity.  Large volume ascites.  9/24: Unable to get dialysis yesterday, received earlier today, able to take about 600 cc of fluid, appears  very lethargic.  Still wants to continue everything.  9/25: Paracentesis was done with removal of 3.6 L of peritoneal fluid for comfort reasons.  Preliminary lab seems transudate, likely secondary to ascites.  Subjective: Patient was seen and examined today.  Appears little confused and oriented to self only.  Assessment & Plan:   Principal Problem:   Hypotension Active Problems:   Seizure ([place])   ESRD on hemodialysis (HCC)   Atrial fibrillation, chronic   Anemia due to chronic kidney disease   Dementia (HCC)   Chronic combined systolic (congestive) and diastolic (congestive) heart failure (HCC)   PVD (peripheral vascular disease) (HCC)   CAD (coronary artery disease)   Prostate cancer, primary, with metastasis from prostate to other site [facility])   Type 2 diabetes mellitus with chronic kidney disease on chronic dialysis ([place])   Cirrhosis of liver with ascites ([place])   Pleural effusion on left   Pressure injury of skin   Chronic respiratory failure with hypoxia (HCC)   Hx of BKA (HCC)  Hypotension.  Blood pressure remained soft.  He is not a candidate for CRRT per nephrology.  Patient did have about 600 cc fluid taken off during dialysis yesterday. -Continue with midodrine -Might not be able to tolerate dialysis as unable to remove much fluid. -Currently wants to remain full code with full scope of care. -Patient is appropriate for comfort measures only but family does not agree with that. -Appreciate palliative care help-a family meeting with nephrology might be helpful.    Recurrent left pleural effusion.  Currently stable on home oxygen requirement of 2 L. -Left-sided thoracentesis-removal of 1.2 L of exudative fluid, preliminary cultures negative.  Unable to tolerate more removal, postprocedural chest x-ray with significant remaining pleural effusion.  CT chest with large bilateral pleural effusions, with basal atelectasis, large volume ascites and liver nodularity.  No pulmonary  nodule noted but there is an extension of osseous metastatic disease. Seems more consistent with heart failure and ESRD as we are unable to remove fluid due to softer blood pressure.  Cirrhosis of liver with ascites.  Imaging with anasarca and large volume ascites. Paracentesis was done yesterday with removal of 3.6 L of transudative fluid.  Seems like reaccumulating today. -Continue to monitor -high risk for rapid reaccumulation.  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 --monitor   Chronic combined systolic (congestive) and diastolic (congestive) heart failure (HCC)     -Worsening anasarca and fluid retention as unable to remove fluid with dialysis due to hypotension. -- continue to hold Toprol given BP; not on diuretic (ESRD) --Last EF noted to be around 20-25% with left ventricular global hypokinesis and moderate pulmonary hypertension at [facility] in 06/2020.   ESRD on hemodialysis [facility]) --nephrology consultation, in past noted to not be a candidate for CRRT. Previous HD limited by chronic hypotension. -Patient desired to continue despite worsening volume status and unable to tolerate dialysis.   Chronic respiratory failure with hypoxia (HCC) --stable. Continue supplemental oxygen.   Dementia ([place]) --appears stable. Not on any outpt meds. Delirium precautions.   Atrial fibrillation, chronic --stable. Toprol on hold for low BP.  Felt to be a poor candidate for anticoagulation by cardiology in the past.   Seizure ([place]) --stable, continue Keppra, gabapentin   Prostate cancer, primary, with metastasis from prostate to other site [facility]) --followed by urology, treated w/ [facility]ler. -Repeat imaging with worsening of metastatic disease.   CAD (coronary artery disease) --asymptomatic, continue aspirin, statin   PVD (peripheral vascular disease) (HCC) --s/p BKA. Stable.    Anemia due to chronic kidney  disease --stable -Continue with Aranesp    Objective: Vitals:   11/12/20 2330 11/13/20 0400 11/13/20 0800 11/13/20 1234  BP: (!) 87/56 (!) 84/56 (!) 74/53 (!) 71/56  Pulse: 78 81 86   Resp: 20 20 16    Temp: 98.1 F (36.7 C) 98.1 F (36.7 C)    TempSrc: Oral Oral    SpO2: 100% 100% 100%   Weight:  89.1 kg      Intake/Output Summary (Last 24 hours) at 11/13/2020 1554 Last data filed at 11/13/2020 1250 Gross per 24 hour  Intake 480 ml  Output --  Net 480 ml    Filed Weights   11/11/20 0910 11/12/20 0500 11/13/20 0400  Weight: 87.6 kg 88.8 kg 89.1 kg    Examination:  General.  Chronically ill-appearing elderly man, in no acute distress. Pulmonary.  Decreased breath sound at bases, normal respiratory effort. CV.  Regular rate and rhythm, no JVD, rub or murmur. Abdomen.  Soft, nontender, mildly distended, BS positive.  Fluid wave positive CNS.  Alert and oriented to self only.  No focal neurologic deficit. Extremities.  No edema, no cyanosis, pulses intact and symmetrical. Psychiatry.  Judgment and insight appears impaired.   DVT prophylaxis: Heparin Code Status: Full Family Communication:  Disposition Plan:  Status is: Inpatient  Remains inpatient appropriate because:Inpatient level of care appropriate due to severity of illness  Dispo: The patient is from: Home              Anticipated d/c is to:  To be determined              Patient  currently is not medically stable to d/c.   Difficult to place patient No               Level of care: Progressive  All the records are reviewed and case discussed with Care Management/Social Worker. Management plans discussed with the patient, nursing and they are in agreement.  Consultants:  Nephrology Palliative care  Procedures:  Antimicrobials:   Data Reviewed: I have personally reviewed following labs and imaging studies  CBC: Recent Labs  Lab 11/11/20 0430  WBC 6.7  HGB 8.3*  HCT 25.7*  MCV 97.3  PLT 199     Basic Metabolic Panel: Recent Labs  Lab 11/10/20 0109 11/11/20 0112 11/11/20 0430 11/12/20 0101 11/13/20 0403  NA 133* 132* 133* 135 133*  K 4.0 4.4 4.3 4.0 3.9  CL 95* 95* 96* 97* 96*  CO2 28 27 27 25 26   GLUCOSE 138* 129* 138* 143* 126*  BUN 32* 37* 39* 28* 36*  CREATININE 4.42* 4.85* 4.99* 3.87* 4.77*  CALCIUM 8.5* 8.5* 8.6* 8.7* 8.5*  PHOS 4.0 4.3 4.3 3.1 3.8    GFR: CrCl cannot be calculated (Unknown ideal weight.). Liver Function Tests: Recent Labs  Lab 11/10/20 0109 11/11/20 0112 11/11/20 0430 11/12/20 0101 11/13/20 0403  ALBUMIN 2.4* 2.4* 2.3* 2.8* 2.6*    No results for input(s): LIPASE, AMYLASE in the last 168 hours. No results for input(s): AMMONIA in the last 168 hours. Coagulation Profile: No results for input(s): INR, PROTIME in the last 168 hours. Cardiac Enzymes: No results for input(s): CKTOTAL, CKMB, CKMBINDEX, TROPONINI in the last 168 hours. BNP (last 3 results) No results for input(s): PROBNP in the last 8760 hours. HbA1C: No results for input(s): HGBA1C in the last 72 hours. CBG: No results for input(s): GLUCAP in the last 168 hours.  Lipid Profile: No results for input(s): CHOL, HDL, LDLCALC, TRIG, CHOLHDL, LDLDIRECT in the last 72 hours. Thyroid Function Tests: No results for input(s): TSH, T4TOTAL, FREET4, T3FREE, THYROIDAB in the last 72 hours. Anemia Panel: No results for input(s): VITAMINB12, FOLATE, FERRITIN, TIBC, IRON, RETICCTPCT in the last 72 hours. Sepsis Labs: No results for input(s): PROCALCITON, LATICACIDVEN in the last 168 hours.   Recent Results (from the past 240 hour(s))  Body fluid culture w Gram Stain     Status: None   Collection Time: 11/09/20  4:07 PM   Specimen: Lung, Left; Pleural Fluid  Result Value Ref Range Status   Specimen Description FLUID PLEURAL LEFT  Final   Special Requests NONE  Final   Gram Stain   Final    FEW WBC PRESENT, PREDOMINANTLY MONONUCLEAR NO ORGANISMS SEEN    Culture   Final     NO GROWTH 3 DAYS Performed at [facility], [address] [place], [place] [address]    Report Status 11/12/2020 FINAL  Final  Culture, body fluid w Gram Stain-bottle     Status: None (Preliminary result)   Collection Time: 11/12/20 11:01 AM   Specimen: Peritoneal Washings  Result Value Ref Range Status   Specimen Description PERITONEAL FLUID  Final   Special Requests NONE  Final   Culture   Final    NO GROWTH 1 DAY Performed at [facility], [place] [address], [place],  [address]    Report Status PENDING  Incomplete  Gram stain     Status: None   Collection Time: 11/12/20 11:01 AM   Specimen: Peritoneal Washings  Result Value Ref Range Status  Specimen Description PERITONEAL FLUID  Final   Special Requests NONE  Final   Gram Stain   Final    WBC PRESENT, PREDOMINANTLY MONONUCLEAR NO ORGANISMS SEEN CYTOSPIN SMEAR Performed at [facility], [place] [address] [place], [place] [address]    Report Status 11/12/2020 FINAL  Final      Radiology Studies: US Paracentesis  Result Date: 11/12/2020 INDICATION: Cirrhosis. Symptomatic ascites. Request for diagnostic and therapeutic paracentesis EXAM: ULTRASOUND GUIDED PARACENTESIS MEDICATIONS: 1% lidocaine 10 mL COMPLICATIONS: None immediate. PROCEDURE: Informed written consent was obtained from the patient after a discussion of the risks, benefits and alternatives to treatment. A timeout was performed prior to the initiation of the procedure. Initial ultrasound scanning demonstrates a large amount of ascites within the right lateral abdomen. The right lateral abdomen was prepped and draped in the usual sterile fashion. 1% lidocaine was used for local anesthesia. Following this, a 19 gauge, 7-cm, Yueh catheter was introduced. An ultrasound image was saved for documentation purposes. The paracentesis was performed. The catheter was removed and a dressing was applied. The patient tolerated the procedure well without  immediate post procedural complication. FINDINGS: A total of approximately 3.6 L of clear yellow fluid was removed. Samples were sent to the laboratory as requested by the clinical team. IMPRESSION: Successful ultrasound-guided paracentesis yielding 3.6 liters of peritoneal fluid. Read by: Gareth Eagle, PA-C Electronically Signed   By: Sandi Mariscal M.D.   On: 11/12/2020 12:41    Scheduled Meds:  aspirin  325 mg Oral Daily   atorvastatin  80 mg Oral QHS   Chlorhexidine Gluconate Cloth  6 each Topical Q0600   clopidogrel  75 mg Oral Daily   darbepoetin (ARANESP) injection - DIALYSIS  150 mcg Intravenous Q Wed-HD   docusate sodium  100 mg Oral Daily   feeding supplement (NEPRO CARB STEADY)  237 mL Oral BID BM   gabapentin  100 mg Oral Daily   And   gabapentin  200 mg Oral QHS   levETIRAcetam  500 mg Oral BID   lidocaine  1 application Topical Daily   midodrine  15 mg Oral TID WC   pantoprazole  20 mg Oral Daily   Continuous Infusions:   LOS: 10 days   Time spent: 33 minutes. More than 50% of the time was spent in counseling/coordination of care  Lorella Nimrod, MD [facility]  If 7PM-7AM, please contact night-coverage [email or web address]  11/13/2020, 3:54 PM   This record has been created using Systems analyst. Errors have been sought and corrected,but may not always be located. Such creation errors do not reflect on the standard of care.

## 2020-11-13 NOTE — Progress Notes (Signed)
[facility] Progress Note   70 year old gentleman with history end-stage renal disease chronic hypotension combined congestive heart failure.  History of cirrhosis hypoxic respiratory failure metastatic prostate cancer.  He received his regular dialysis treatment at [facility] [place].  He was admitted with hypotension and dyspnea. BP very soft and unable to UF much.  Assessment/ Plan:   1)  ESRD:  -outpatient orders: [facility], MWF, EDW 75 kg, left upper extremity AVG (also has TDC), 4 hours, 400/600, 3K, 2.5 calcium, heparin 3800unit bolus (no maintenance)  Patient received dialysis 11/11/2020 his next dialysis 11/14/2020.  He is off schedule due to high dialysis volumes/census  On for HD today and we see if he tolerates; may not be able to keep up with his I&O demands as UF very limited bec of his soft pressures.  Much appreciate palliative discussions and should move towards comfort care but family not quite ready yet. At minimum should be DNR given prognosis and QOL concerns.   2) Pleural effusion, left -  thora;per primary, HD will not be able to fix this   3) Cirrhosis w/ ascites - para, per primary   4)  Volume/ chronic hypotension: EDW 75kg.  Ultrafiltration limited by hypotension.  Currently receiving 15 mg of midodrine 3 times daily     5)  Anemia of Chronic Kidney Disease: Drop in hemoglobin 8.0.  ESA added   6) Secondary Hyperparathyroidism/Hyperphosphatemia: Appears to be in goal.   7) Vascular access: has LUE AVG that is being cannulated make arrangements for [facility] to be removed next week  Subjective:   Confused, c/o mild shortness of breath.   Objective:   BP (!) 74/53   Pulse 86   Temp 98.1 F (36.7 C) (Oral)   Resp 16   Wt 89.1 kg   SpO2 100%   BMI 26.64 kg/m   Intake/Output Summary (Last 24 hours) at 11/13/2020 1105 Last data filed at 11/12/2020 1420 Gross per 24 hour  Intake 120 ml  Output --  Net 120 ml    Weight change: 1.5 kg  Physical Exam: EXB:[id number] ill appearing, laying at a 30deg angle  in bed CVS:rrr Resp:decreased air entry left fields, normal WOB, on 2LNC UVO:[id number], soft, nt VQQ:VZDG bka, trace to 1+ edema  LE Neuro: awake HD access: RIJ TDC, LUE AVG (+bruit)  Imaging: US Paracentesis  Result Date: 11/12/2020 INDICATION: Cirrhosis. Symptomatic ascites. Request for diagnostic and therapeutic paracentesis EXAM: ULTRASOUND GUIDED PARACENTESIS MEDICATIONS: 1% lidocaine 10 mL COMPLICATIONS: None immediate. PROCEDURE: Informed written consent was obtained from the patient after a discussion of the risks, benefits and alternatives to treatment. A timeout was performed prior to the initiation of the procedure. Initial ultrasound scanning demonstrates a large amount of ascites within the right lateral abdomen. The right lateral abdomen was prepped and draped in the usual sterile fashion. 1% lidocaine was used for local anesthesia. Following this, a 19 gauge, 7-cm, Yueh catheter was introduced. An ultrasound image was saved for documentation purposes. The paracentesis was performed. The catheter was removed and a dressing was applied. The patient tolerated the procedure well without immediate post procedural complication. FINDINGS: A total of approximately 3.6 L of clear yellow fluid was removed. Samples were sent to the laboratory as requested by the clinical team. IMPRESSION: Successful ultrasound-guided paracentesis yielding 3.6 liters of peritoneal fluid. Read by: Gareth Eagle, PA-C Electronically Signed   By: Sandi Mariscal M.D.   On: 11/12/2020 12:41    Labs: [employer] Recent Labs  Lab 11/08/20 0143 11/09/20 0130 11/10/20 0109 11/11/20 0112 11/11/20 0430 11/12/20 0101 11/13/20 0403  NA 135 134* 133* 132* 133* 135 133*  K 3.9 3.9 4.0 4.4 4.3 4.0 3.9  CL 96* 98 95* 95* 96* 97* 96*  CO2 27 25 28 27 27 25 26   GLUCOSE 147* 138* 138* 129* 138* 143* 126*  BUN 34* 25* 32* 37* 39* 28*  36*  CREATININE 4.65* 3.66* 4.42* 4.85* 4.99* 3.87* 4.77*  CALCIUM 8.5* 8.7* 8.5* 8.5* 8.6* 8.7* 8.5*  PHOS 4.1 3.5 4.0 4.3 4.3 3.1 3.8   CBC Recent Labs  Lab 11/06/20 1157 11/11/20 0430  WBC 6.3 6.7  HGB 8.0* 8.3*  HCT 24.2* 25.7*  MCV 96.0 97.3  PLT 201 199    Medications:     aspirin  325 mg Oral Daily   atorvastatin  80 mg Oral QHS   Chlorhexidine Gluconate Cloth  6 each Topical Q0600   clopidogrel  75 mg Oral Daily   darbepoetin (ARANESP) injection - DIALYSIS  150 mcg Intravenous Q Wed-HD   docusate sodium  100 mg Oral Daily   feeding supplement (NEPRO CARB STEADY)  237 mL Oral BID BM   gabapentin  100 mg Oral Daily   And   gabapentin  200 mg Oral QHS   levETIRAcetam  500 mg Oral BID   lidocaine  1 application Topical Daily   midodrine  15 mg Oral TID WC   pantoprazole  20 mg Oral Daily      Otelia Santee, MD 11/13/2020, 11:05 AM

## 2020-11-14 DIAGNOSIS — I959 Hypotension, unspecified: Secondary | ICD-10-CM | POA: Diagnosis not present

## 2020-11-14 LAB — RENAL FUNCTION PANEL
Albumin: 2.4 g/dL — ABNORMAL LOW (ref 3.5–5.0)
Anion gap: 14 (ref 5–15)
BUN: 46 mg/dL — ABNORMAL HIGH (ref 8–23)
CO2: 22 mmol/L (ref 22–32)
Calcium: 8.4 mg/dL — ABNORMAL LOW (ref 8.9–10.3)
Chloride: 96 mmol/L — ABNORMAL LOW (ref 98–111)
Creatinine, Ser: 5.34 mg/dL — ABNORMAL HIGH (ref 0.61–1.24)
GFR, Estimated: 11 mL/min — ABNORMAL LOW (ref >60)
Glucose, Bld: 142 mg/dL — ABNORMAL HIGH (ref 70–99)
Phosphorus: 3.9 mg/dL (ref 2.5–4.6)
Potassium: 4.2 mmol/L (ref 3.5–5.1)
Sodium: 132 mmol/L — ABNORMAL LOW (ref 135–145)

## 2020-11-14 MED ORDER — HEPARIN SODIUM (PORCINE) 1000 UNIT/ML IJ SOLN
INTRAMUSCULAR | Status: AC
  Filled 2020-11-14: qty 4

## 2020-11-14 MED ORDER — ALBUMIN HUMAN 25 % IV SOLN
INTRAVENOUS | Status: AC
  Administered 2020-11-14: 25 g
  Filled 2020-11-14: qty 100

## 2020-11-14 NOTE — Progress Notes (Signed)
[facility] Progress Note   70 year old gentleman with history end-stage renal disease chronic hypotension combined congestive heart failure.  History of cirrhosis hypoxic respiratory failure metastatic prostate cancer.  He received his regular dialysis treatment at [facility] in [place].  He was admitted with hypotension and dyspnea. BP very soft and unable to UF much.  Assessment/ Plan:   1)  ESRD:  -outpatient orders: [facility], MWF, EDW 75 kg, left upper extremity AVG (also has TDC), 4 hours, 400/600, 3K, 2.5 calcium, heparin 3800unit bolus (no maintenance)  Patient last  dialysis 11/11/2020 his next dialysis is for today 9/27.  He is off schedule due to high dialysis volumes/census  We will see if he tolerates; may not be able to keep up with his I&O demands as UF very limited bec of his soft pressures. He was not dialyzed on 9/26.  I am not convinced he will tolerate UF and would like to see him moved to comfort care measures.  Much appreciate palliative discussions.  At minimum should be DNR given prognosis and QOL concerns. I addressed that with the patient today and he was in agreement but he has gone back and forth during this hospitalization. If he doesn't tolerate dialysis with UF today we need to have another discussion and de-escalate aggressive therapies such as dialysis.   2) Pleural effusion, left -  thora;per primary, HD will not be able to fix this   3) Cirrhosis w/ ascites - para, per primary   4)  Volume/ chronic hypotension: EDW 75kg.  Ultrafiltration limited by hypotension.  Currently receiving 15 mg of midodrine 3 times daily     5)  Anemia of Chronic Kidney Disease: Drop in hemoglobin 8.0.  ESA added   6) Secondary Hyperparathyroidism/Hyperphosphatemia: Appears to be in goal.   7) Vascular access: has LUE AVG that is being cannulated make arrangements for [facility] to be removed next week  Subjective:   Confused, c/o mild  shortness of breath.   Objective:   BP (!) 80/56 (BP Location: Right Arm)   Pulse 99   Temp 97.6 F (36.4 C) (Oral)   Resp 19   Wt 89 kg   SpO2 95%   BMI 26.61 kg/m   Intake/Output Summary (Last 24 hours) at 11/14/2020 1109 Last data filed at 11/14/2020 6160 Gross per 24 hour  Intake 480 ml  Output --  Net 480 ml   Weight change: -0.1 kg  Physical Exam: VPX:[id number] ill appearing, laying at a 30deg angle  in bed CVS:rrr Resp:decreased air entry left fields, normal WOB, on 2LNC EVO:[id number], soft, nt EXH:BZJI bka, trace to 1+ edema  LE Neuro: awake HD access: RIJ TDC, LUE AVG (+bruit)  Imaging: No results found.  Labs: BMET Recent Labs  Lab 11/09/20 0130 11/10/20 0109 11/11/20 0112 11/11/20 0430 11/12/20 0101 11/13/20 0403 11/14/20 0150  NA 134* 133* 132* 133* 135 133* 132*  K 3.9 4.0 4.4 4.3 4.0 3.9 4.2  CL 98 95* 95* 96* 97* 96* 96*  CO2 25 28 27 27 25 26 22   GLUCOSE 138* 138* 129* 138* 143* 126* 142*  BUN 25* 32* 37* 39* 28* 36* 46*  CREATININE 3.66* 4.42* 4.85* 4.99* 3.87* 4.77* 5.34*  CALCIUM 8.7* 8.5* 8.5* 8.6* 8.7* 8.5* 8.4*  PHOS 3.5 4.0 4.3 4.3 3.1 3.8 3.9   CBC Recent Labs  Lab 11/11/20 0430  WBC 6.7  HGB 8.3*  HCT 25.7*  MCV 97.3  PLT 199    Medications:  aspirin  325 mg Oral Daily   atorvastatin  80 mg Oral QHS   Chlorhexidine Gluconate Cloth  6 each Topical Q0600   clopidogrel  75 mg Oral Daily   darbepoetin (ARANESP) injection - DIALYSIS  150 mcg Intravenous Q Wed-HD   docusate sodium  100 mg Oral Daily   feeding supplement (NEPRO CARB STEADY)  237 mL Oral BID BM   gabapentin  100 mg Oral Daily   And   gabapentin  200 mg Oral QHS   levETIRAcetam  500 mg Oral BID   lidocaine  1 application Topical Daily   midodrine  15 mg Oral TID WC   pantoprazole  20 mg Oral Daily      Otelia Santee, MD 11/14/2020, 11:09 AM

## 2020-11-14 NOTE — Progress Notes (Signed)
PROGRESS NOTE    George Burgess  EQA:[id number] DOB: 1951-01-04 DOA: 11/03/2020 PCP: Caprice Renshaw, MD   Brief Narrative: Taken from prior notes. 70 year old man complicated PMH including ESRD, chronic hypotension, chronic combined CHF, cirrhosis, chronic hypoxic respiratory failure, metastatic prostate cancer, presenting with shortness of breath.  Admitted for acute on chronic hypoxic respiratory failure, initially thought secondary to pleural effusion, however patient remains at baseline oxygen requirement despite presence of effusion.  Condition complicated by recently missed dialysis session.  He appears clinically stable with chronic hypotension, however he has not successfully dialyzed during hospitalization, no volume was able to be removed 9/19.  Patient had another dialysis today-blood pressure becoming soft even with albumin.  Unable to do much volume removal due to softer blood pressure. Thoracentesis was ordered. Palliative care was consulted-patient and family decided to remain full code with full scope of medical care.  If continue not to tolerate dialysis then palliative care needs to revisit and go to hospice home.  Unable to complete dialysis due to softer blood pressure.  Family still wants full scope of care.  Unable to remove extra fluid.  Thoracentesis ordered but doubt that will help. Patient is very high risk for deterioration and death-appropriate for comfort measures only.  9/23: Left thoracentesis with removal of 1.2 L of fluid, unable to take more fluid off as patient started coughing and becoming uncomfortable.  Initial labs seems exudative but cultures so far negative. CT chest was ordered because of his history of metastatic prostate cancer, that did not show lung lesions but did show worsening of osseous metastasis and liver nodularity.  Large volume ascites.  9/24: Unable to get dialysis yesterday, received earlier today, able to take about 600 cc of fluid, appears  very lethargic.  Still wants to continue everything.  9/25: Paracentesis was done with removal of 3.6 L of peritoneal fluid for comfort reasons.  Preliminary lab seems transudate, likely secondary to ascites.  9/27: Significant reaccumulation of ascitic fluid.  Blood pressure remained very soft and he appears very lethargic.  Per nephrology if they cannot do any UF with dialysis today then he will not be offered more dialysis.  Palliative care is on board and trying to arrange another family meeting.  Up till this point family was very determined to continue every possible thing and refusing our recommendations.  Subjective: Patient was seen and examined today.  Appears very lethargic.  When I again asked about dialysis he wants to proceed.  Appears quite confused and oriented to self only.  Assessment & Plan:   Principal Problem:   Hypotension Active Problems:   Seizure ([place])   ESRD on hemodialysis (HCC)   Atrial fibrillation, chronic   Anemia due to chronic kidney disease   Dementia (HCC)   Chronic combined systolic (congestive) and diastolic (congestive) heart failure (HCC)   PVD (peripheral vascular disease) (HCC)   CAD (coronary artery disease)   Prostate cancer, primary, with metastasis from prostate to other site [facility])   Type 2 diabetes mellitus with chronic kidney disease on chronic dialysis ([place])   Cirrhosis of liver with ascites ([place])   Pleural effusion on left   Pressure injury of skin   Chronic respiratory failure with hypoxia (HCC)   Hx of BKA (HCC)  Hypotension.  Blood pressure remained soft.  He is not a candidate for CRRT per nephrology.  Patient did have about 600 cc fluid taken off during dialysis yesterday. -Continue with midodrine -Might not be able to tolerate dialysis  as unable to remove much fluid. -Currently wants to remain full code with full scope of care. -Patient is appropriate for comfort measures only but family does not agree with that. -Per  nephrology if they cannot do any UF with dialysis today-HD will not be an option for him. -Appreciate palliative care help-a family meeting with nephrology might be helpful.    Recurrent left pleural effusion.  Currently stable on home oxygen requirement of 2 L. -Left-sided thoracentesis-removal of 1.2 L of exudative fluid, preliminary cultures negative.  Unable to tolerate more removal, postprocedural chest x-ray with significant remaining pleural effusion. CT chest with large bilateral pleural effusions, with basal atelectasis, large volume ascites and liver nodularity.  No pulmonary nodule noted but there is an extension of osseous metastatic disease. Seems more consistent with heart failure and ESRD as we are unable to remove fluid due to softer blood pressure.  Cirrhosis of liver with ascites.  Imaging with anasarca and large volume ascites. Paracentesis was done 9/25 with removal of 3.6 L of transudative fluid.  -Rapidly accumulating again. -Continue to monitor  Type 2 diabetes mellitus with chronic kidney disease on chronic dialysis ([place]) --stable w/ last A1c 6.6. --monitor   Chronic combined systolic (congestive) and diastolic (congestive) heart failure ([place])     -Worsening anasarca and fluid retention as unable to remove fluid with dialysis due to hypotension. -- continue to hold Toprol given BP; not on diuretic (ESRD) --Last EF noted to be around 20-25% with left ventricular global hypokinesis and moderate pulmonary hypertension at [facility] in 06/2020.   ESRD on hemodialysis [facility]) --nephrology consultation, in past noted to not be a candidate for CRRT. Previous HD limited by chronic hypotension. -Patient desired to continue despite worsening volume status and unable to tolerate dialysis. -If we cannot do any fluid removal with dialysis today-he will not be offered more HD per nephrology which is very appropriate.   Chronic respiratory failure with hypoxia (HCC) --stable.  Continue supplemental oxygen.   Dementia ([place]) --appears stable. Not on any outpt meds. Delirium precautions.   Atrial fibrillation, chronic --stable. Toprol on hold for low BP.  Felt to be a poor candidate for anticoagulation by cardiology in the past.   Seizure ([place]) --stable, continue Keppra, gabapentin   Prostate cancer, primary, with metastasis from prostate to other site [facility]) --followed by urology, treated w/ [facility]. -Repeat imaging with worsening of metastatic disease.   CAD (coronary artery disease) --asymptomatic, continue aspirin, statin   PVD (peripheral vascular disease) (HCC) --s/p BKA. Stable.    Anemia due to chronic kidney disease --stable -Continue with Aranesp    Objective: Vitals:   11/14/20 1321 11/14/20 1330 11/14/20 1400 11/14/20 1430  BP:  (!) 93/48 129/85 140/63  Pulse:  88 85 61  Resp:  20 12 (!) 26  Temp:      TempSrc: Oral     SpO2:      Weight:        Intake/Output Summary (Last 24 hours) at 11/14/2020 1501 Last data filed at 11/14/2020 0904 Gross per 24 hour  Intake 240 ml  Output --  Net 240 ml    Filed Weights   11/13/20 0400 11/14/20 0409 11/14/20 1255  Weight: 89.1 kg 89 kg 81.7 kg    Examination:  General.  Very lethargic gentleman, in no acute distress. Pulmonary.  Lungs clear bilaterally, normal respiratory effort. CV.  Regular rate and rhythm, no JVD, rub or murmur. Abdomen.  Soft, nontender, nondistended, BS positive. CNS.  Alert and oriented to self only, no focal neurologic deficit. Extremities.  Right 2+ LE edema, left BKA Psychiatry.  Judgment and insight appears impaired.  DVT prophylaxis: Heparin Code Status: Full Family Communication:  Disposition Plan:  Status is: Inpatient  Remains inpatient appropriate because:Inpatient level of care appropriate due to severity of illness  Dispo: The patient is from: Home              Anticipated d/c is to:  To be determined              Patient currently is not  medically stable to d/c.   Difficult to place patient No               Level of care: Progressive  All the records are reviewed and case discussed with Care Management/Social Worker. Management plans discussed with the patient, nursing and they are in agreement.  Consultants:  Nephrology Palliative care  Procedures:  Antimicrobials:   Data Reviewed: I have personally reviewed following labs and imaging studies  CBC: Recent Labs  Lab 11/11/20 0430  WBC 6.7  HGB 8.3*  HCT 25.7*  MCV 97.3  PLT 952    Basic Metabolic Panel: Recent Labs  Lab 11/11/20 0112 11/11/20 0430 11/12/20 0101 11/13/20 0403 11/14/20 0150  NA 132* 133* 135 133* 132*  K 4.4 4.3 4.0 3.9 4.2  CL 95* 96* 97* 96* 96*  CO2 27 27 25 26 22   GLUCOSE 129* 138* 143* 126* 142*  BUN 37* 39* 28* 36* 46*  CREATININE 4.85* 4.99* 3.87* 4.77* 5.34*  CALCIUM 8.5* 8.6* 8.7* 8.5* 8.4*  PHOS 4.3 4.3 3.1 3.8 3.9    GFR: CrCl cannot be calculated (Unknown ideal weight.). Liver Function Tests: Recent Labs  Lab 11/11/20 0112 11/11/20 0430 11/12/20 0101 11/13/20 0403 11/14/20 0150  ALBUMIN 2.4* 2.3* 2.8* 2.6* 2.4*    No results for input(s): LIPASE, AMYLASE in the last 168 hours. No results for input(s): AMMONIA in the last 168 hours. Coagulation Profile: No results for input(s): INR, PROTIME in the last 168 hours. Cardiac Enzymes: No results for input(s): CKTOTAL, CKMB, CKMBINDEX, TROPONINI in the last 168 hours. BNP (last 3 results) No results for input(s): PROBNP in the last 8760 hours. HbA1C: No results for input(s): HGBA1C in the last 72 hours. CBG: No results for input(s): GLUCAP in the last 168 hours.  Lipid Profile: No results for input(s): CHOL, HDL, LDLCALC, TRIG, CHOLHDL, LDLDIRECT in the last 72 hours. Thyroid Function Tests: No results for input(s): TSH, T4TOTAL, FREET4, T3FREE, THYROIDAB in the last 72 hours. Anemia Panel: No results for input(s): VITAMINB12, FOLATE, FERRITIN, TIBC,  IRON, RETICCTPCT in the last 72 hours. Sepsis Labs: No results for input(s): PROCALCITON, LATICACIDVEN in the last 168 hours.   Recent Results (from the past 240 hour(s))  Body fluid culture w Gram Stain     Status: None   Collection Time: 11/09/20  4:07 PM   Specimen: Lung, Left; Pleural Fluid  Result Value Ref Range Status   Specimen Description FLUID PLEURAL LEFT  Final   Special Requests NONE  Final   Gram Stain   Final    FEW WBC PRESENT, PREDOMINANTLY MONONUCLEAR NO ORGANISMS SEEN    Culture   Final    NO GROWTH 3 DAYS Performed at [facility], [address] [place], [place] [address]    Report Status 11/12/2020 FINAL  Final  Culture, body fluid w Gram Stain-bottle     Status: None (Preliminary  result)   Collection Time: 11/12/20 11:01 AM   Specimen: Peritoneal Washings  Result Value Ref Range Status   Specimen Description PERITONEAL FLUID  Final   Special Requests NONE  Final   Culture   Final    NO GROWTH 2 DAYS Performed at [facility], [address], [place], [place] [address]    Report Status PENDING  Incomplete  Gram stain     Status: None   Collection Time: 11/12/20 11:01 AM   Specimen: Peritoneal Washings  Result Value Ref Range Status   Specimen Description PERITONEAL FLUID  Final   Special Requests NONE  Final   Gram Stain   Final    WBC PRESENT, PREDOMINANTLY MONONUCLEAR NO ORGANISMS SEEN CYTOSPIN SMEAR Performed at [facility], [place] [address] [place], [place] [address]    Report Status 11/12/2020 FINAL  Final      Radiology Studies: No results found.  Scheduled Meds:  aspirin  325 mg Oral Daily   atorvastatin  80 mg Oral QHS   Chlorhexidine Gluconate Cloth  6 each Topical Q0600   clopidogrel  75 mg Oral Daily   darbepoetin (ARANESP) injection - DIALYSIS  150 mcg Intravenous Q Wed-HD   docusate sodium  100 mg Oral Daily   feeding supplement (NEPRO CARB STEADY)  237 mL Oral BID BM   gabapentin  100 mg Oral Daily    And   gabapentin  200 mg Oral QHS   levETIRAcetam  500 mg Oral BID   lidocaine  1 application Topical Daily   midodrine  15 mg Oral TID WC   pantoprazole  20 mg Oral Daily   Continuous Infusions:   LOS: 11 days   Time spent: 38 minutes. More than 50% of the time was spent in counseling/coordination of care  Lorella Nimrod, MD [facility]  If 7PM-7AM, please contact night-coverage [email or web address]  11/14/2020, 3:01 PM   This record has been created using Systems analyst. Errors have been sought and corrected,but may not always be located. Such creation errors do not reflect on the standard of care.

## 2020-11-14 NOTE — Progress Notes (Signed)
Palliative-   Chart reviewed. Patient in HD.  Noted nephrologist note that if patient does not tolerate HD with UF today, then dialysis will no longer be an option.  I called George Burgess and gave an update. He understands and is going to start reaching out to family members to attempt to schedule a time for [place] all to meet.  I will reach out to him again tomorrow morning to schedule a meeting pending recommendations from nephrology based on how [name] does at HD today.   Mariana Kaufman, AGNP-C Palliative Medicine  No charge

## 2020-11-14 NOTE — Progress Notes (Signed)
[facility] in [place] to confirm pt's days/time. Pt receives HD on MWF at 10:35 am. Will assist as needed.  Melven Sartorius  Renal Navigator [phone]

## 2020-11-15 DIAGNOSIS — I959 Hypotension, unspecified: Secondary | ICD-10-CM | POA: Diagnosis not present

## 2020-11-15 LAB — RENAL FUNCTION PANEL
Albumin: 2.8 g/dL — ABNORMAL LOW (ref 3.5–5.0)
Anion gap: 13 (ref 5–15)
BUN: 34 mg/dL — ABNORMAL HIGH (ref 8–23)
CO2: 23 mmol/L (ref 22–32)
Calcium: 8.1 mg/dL — ABNORMAL LOW (ref 8.9–10.3)
Chloride: 96 mmol/L — ABNORMAL LOW (ref 98–111)
Creatinine, Ser: 4.18 mg/dL — ABNORMAL HIGH (ref 0.61–1.24)
GFR, Estimated: 14 mL/min — ABNORMAL LOW (ref >60)
Glucose, Bld: 137 mg/dL — ABNORMAL HIGH (ref 70–99)
Phosphorus: 3.1 mg/dL (ref 2.5–4.6)
Potassium: 3.7 mmol/L (ref 3.5–5.1)
Sodium: 132 mmol/L — ABNORMAL LOW (ref 135–145)

## 2020-11-15 NOTE — Progress Notes (Signed)
PROGRESS NOTE    George Burgess  XIH:[id number] DOB: [birth year] DOA: 11/03/2020 PCP: Caprice Renshaw, MD   Brief Narrative:  This 70 year old man with complicated PMH including ESRD, chronic hypotension, chronic combined CHF, cirrhosis, chronic hypoxic respiratory failure, metastatic prostate cancer, presented with shortness of breath.  Admitted for acute on chronic hypoxic respiratory failure, initially thought secondary to pleural effusion, however patient remains at baseline oxygen requirement despite presence of effusion.  Condition complicated by recently missed dialysis session.  He appears clinically stable with chronic hypotension, however he has not successfully dialyzed during hospitalization, no volume was able to be removed on 9/19.  Patient had another dialysis 9/27 -blood pressure remained soft even with albumin.  Unable to do much volume removal due to softer blood pressure. Palliative care was consulted-patient and family decided to remain full code with full scope of medical care.  If continue not to tolerate dialysis then palliative care needs to revisit and go to hospice home.   Unable to complete dialysis due to softer blood pressure.  Family still wants full scope of care.  Unable to remove extra fluid.  Thoracentesis ordered but doubt that will help. Patient is very high risk for deterioration and death-appropriate for comfort measures only.   9/23: Left thoracentesis with removal of 1.2 L of fluid, unable to take more fluid off as patient started coughing and becoming uncomfortable.  Initial labs seems exudative but cultures so far negative. CT chest was ordered because of his history of metastatic prostate cancer, that did not show lung lesions but did show worsening of osseous metastasis and liver nodularity.  Large volume ascites.   9/25: Paracentesis was done with removal of 3.6 L of peritoneal fluid for comfort reasons.  Preliminary lab seems transudate, likely secondary to  ascites.   9/27: Significant reaccumulation of ascitic fluid.  Blood pressure remained very soft and he appears very lethargic.  Per nephrology if they cannot do any UF with dialysis  then he will not be offered more dialysis.  Palliative care is on board and trying to arrange another family meeting.    Family meeting arranged tomorrow to discuss goals of care.  Assessment & Plan:   Principal Problem:   Hypotension Active Problems:   Seizure ([place])   ESRD on hemodialysis (HCC)   Atrial fibrillation, chronic   Anemia due to chronic kidney disease   Dementia (HCC)   Chronic combined systolic (congestive) and diastolic (congestive) heart failure (HCC)   PVD (peripheral vascular disease) (HCC)   CAD (coronary artery disease)   Prostate cancer, primary, with metastasis from prostate to other site [facility]   Type 2 diabetes mellitus with chronic kidney disease on chronic dialysis ([name])   Cirrhosis of liver with ascites ([place])   Pleural effusion on left   Pressure injury of skin   Chronic respiratory failure with hypoxia (HCC)   Hx of BKA (HCC)  Chronic hypotension: Patient does have history of chronically low blood pressure.   Patient is on midodrine 15 mg 3 times daily. BP remained soft ,  he is not a candidate for CRRT per nephrology.   Patient did have about 600 cc fluid taken off during dialysis 9/27. Continue midodrine Patient is appropriate for comfort measures only but family does not agree with that. Per nephrology if they cannot do any UF with dialysis -HD will not be an option for him. Palliative care was consulted.  Family meeting is scheduled tomorrow to discuss goals of care   Recurrent  left pleural effusion: Currently stable on home oxygen requirement of 2 L. S/P Left-sided thoracentesis- removal of 1.2 L of exudative fluid, preliminary cultures negative.   Unable to tolerate more removal, postprocedural chest x-ray with significant remaining pleural effusion. CT chest  with large bilateral pleural effusions, with basal atelectasis, large volume ascites and liver nodularity.  No pulmonary nodule noted but there is an extension of osseous metastatic disease. Seems more consistent with heart failure and ESRD as we are unable to remove fluid due to softer blood pressure.   Cirrhosis of liver with ascites: Imaging shows large volume ascites and anasarca Paracentesis was done 9/25 with removal of 3.6 L of transudative fluid.  Reaccumulating again. Continue to monitor   Type 2 diabetes with CKD on chronic dialysis: Hemoglobin A1c 6.6 Continue sliding scale   Chronic combined systolic and diastolic heart failure: Unable to remove fluid with dialysis due to hypotension which is causing worsening anasarca and fluid retention Hold Toprol given low blood pressure. Last EF noted to be around 20-25% with left ventricular global hypokinesis and moderate pulmonary hypertension at [facility] in 06/2020.   End-stage renal disease on dialysis: Patient is not a candidate for CRRT.  Previous hemodialysis limited by chronic hypotension. Dialysis has been very difficult because of his tenacious low blood pressure despite midodrine.  Nephrology recommended discontinue further dialysis as renal replacement therapy is not even able to keep the patient even and continue to accumulate fluid despite dialysis. Palliative care is on board family meeting is happening tomorrow to discuss goals of care  Chronic hypoxic respiratory failure: Continue supplemental oxygen.  Dementia: Appears stable, continue delirium precautions  Atrial fibrillation chronic: Heart rate remains stable,  Patient is poor candidate for anticoagulation as per cardiology in the past. Keep Toprol and hold.   Seizure disorder: Continue Keppra and gabapentin  Metastatic prostate cancer: Followed by urology, treated w/ [facility]. Repeat imaging with worsening of metastatic disease.   CAD (coronary artery  disease) Continue aspirin and statins   PVD (peripheral vascular disease) ([place]) S/p left BKA   Anemia due to chronic kidney disease Hemoglobin is stable. Continue with Aranesp    DVT prophylaxis: Heparin Code Status: Full code Family Communication: No family at bedside Disposition Plan:    Status is: Inpatient  Remains inpatient appropriate because:Inpatient level of care appropriate due to severity of illness  Dispo: The patient is from: Home              Anticipated d/c is to: SNF              Patient currently is not medically stable to d/c.   Difficult to place patient No   Consultants:  Nephrology Palliative care  Procedures: Antimicrobials:   Anti-infectives (From admission, onward)    None        Subjective: Patient was seen and examined at bedside.  Overnight events noted.   Patient is alert oriented x 2.  Patient reports he is aware that his overall condition is worse.  Objective: Vitals:   11/14/20 2305 11/15/20 0407 11/15/20 0752 11/15/20 1229  BP: 125/74 (!) 97/45 (!) 82/54 101/75  Pulse: 86 90  67  Resp: 20 20 20 20   Temp: 98 F (36.7 C) 98.6 F (37 C) 97.8 F (36.6 C) 98.4 F (36.9 C)  TempSrc: Oral Oral Oral Axillary  SpO2: 98% 96% 100% 96%  Weight:        Intake/Output Summary (Last 24 hours) at 11/15/2020 1410 Last data  filed at 11/14/2020 1647 Gross per 24 hour  Intake 38.33 ml  Output 762 ml  Net -723.67 ml   Filed Weights   11/14/20 0409 11/14/20 1255 11/14/20 1700  Weight: 89 kg 81.7 kg 81 kg    Examination:  General exam: Appears calm and comfortable.  Not in any acute distress, right chest testio noted. Respiratory system: Clear to auscultation. Respiratory effort normal. Cardiovascular system: S1-S2 heard, regular rhythm, no murmur. Gastrointestinal system: Abdomen is distended, soft, nontender, BS+ Central nervous system: Alert and oriented x 2. No focal neurological deficits. Extremities: Left AV fistula noted,   not functioning, left BKA. Skin: No rashes, lesions or ulcers Psychiatry:  Mood & affect appropriate.     Data Reviewed: I have personally reviewed following labs and imaging studies  CBC: Recent Labs  Lab 11/11/20 0430  WBC 6.7  HGB 8.3*  HCT 25.7*  MCV 97.3  PLT 053   Basic Metabolic Panel: Recent Labs  Lab 11/11/20 0430 11/12/20 0101 11/13/20 0403 11/14/20 0150 11/15/20 0132  NA 133* 135 133* 132* 132*  K 4.3 4.0 3.9 4.2 3.7  CL 96* 97* 96* 96* 96*  CO2 27 25 26 22 23   GLUCOSE 138* 143* 126* 142* 137*  BUN 39* 28* 36* 46* 34*  CREATININE 4.99* 3.87* 4.77* 5.34* 4.18*  CALCIUM 8.6* 8.7* 8.5* 8.4* 8.1*  PHOS 4.3 3.1 3.8 3.9 3.1   GFR: CrCl cannot be calculated (Unknown ideal weight.). Liver Function Tests: Recent Labs  Lab 11/11/20 0430 11/12/20 0101 11/13/20 0403 11/14/20 0150 11/15/20 0132  ALBUMIN 2.3* 2.8* 2.6* 2.4* 2.8*   No results for input(s): LIPASE, AMYLASE in the last 168 hours. No results for input(s): AMMONIA in the last 168 hours. Coagulation Profile: No results for input(s): INR, PROTIME in the last 168 hours. Cardiac Enzymes: No results for input(s): CKTOTAL, CKMB, CKMBINDEX, TROPONINI in the last 168 hours. BNP (last 3 results) No results for input(s): PROBNP in the last 8760 hours. HbA1C: No results for input(s): HGBA1C in the last 72 hours. CBG: No results for input(s): GLUCAP in the last 168 hours. Lipid Profile: No results for input(s): CHOL, HDL, LDLCALC, TRIG, CHOLHDL, LDLDIRECT in the last 72 hours. Thyroid Function Tests: No results for input(s): TSH, T4TOTAL, FREET4, T3FREE, THYROIDAB in the last 72 hours. Anemia Panel: No results for input(s): VITAMINB12, FOLATE, FERRITIN, TIBC, IRON, RETICCTPCT in the last 72 hours. Sepsis Labs: No results for input(s): PROCALCITON, LATICACIDVEN in the last 168 hours.  Recent Results (from the past 240 hour(s))  Body fluid culture w Gram Stain     Status: None   Collection Time:  11/09/20  4:07 PM   Specimen: Lung, Left; Pleural Fluid  Result Value Ref Range Status   Specimen Description FLUID PLEURAL LEFT  Final   Special Requests NONE  Final   Gram Stain   Final    FEW WBC PRESENT, PREDOMINANTLY MONONUCLEAR NO ORGANISMS SEEN    Culture   Final    NO GROWTH 3 DAYS Performed at [facility], [address], [place], [place] [address]    Report Status 11/12/2020 FINAL  Final  Culture, body fluid w Gram Stain-bottle     Status: None (Preliminary result)   Collection Time: 11/12/20 11:01 AM   Specimen: Peritoneal Washings  Result Value Ref Range Status   Specimen Description PERITONEAL FLUID  Final   Special Requests NONE  Final   Culture   Final    NO GROWTH 3 DAYS Performed  at [facility], [place] [address], [place], [place] [address]    Report Status PENDING  Incomplete  Gram stain     Status: None   Collection Time: 11/12/20 11:01 AM   Specimen: Peritoneal Washings  Result Value Ref Range Status   Specimen Description PERITONEAL FLUID  Final   Special Requests NONE  Final   Gram Stain   Final    WBC PRESENT, PREDOMINANTLY MONONUCLEAR NO ORGANISMS SEEN CYTOSPIN SMEAR Performed at [facility], [place] [address], [place],  [address]    Report Status 11/12/2020 FINAL  Final    Radiology Studies: No results found.  Scheduled Meds:  aspirin  325 mg Oral Daily   atorvastatin  80 mg Oral QHS   Chlorhexidine Gluconate Cloth  6 each Topical Q0600   clopidogrel  75 mg Oral Daily   darbepoetin (ARANESP) injection - DIALYSIS  150 mcg Intravenous Q Wed-HD   docusate sodium  100 mg Oral Daily   feeding supplement (NEPRO CARB STEADY)  237 mL Oral BID BM   gabapentin  100 mg Oral Daily   And   gabapentin  200 mg Oral QHS   levETIRAcetam  500 mg Oral BID   lidocaine  1 application Topical Daily   midodrine  15 mg Oral TID WC   pantoprazole  20 mg Oral Daily   Continuous Infusions:   LOS: 12 days    Time spent: 35  mins    Rubi Tooley, MD [facility]   If 7PM-7AM, please contact night-coverage

## 2020-11-15 NOTE — Care Management Important Message (Signed)
Important Message  Patient Details  Name: George Burgess MRN: [id number] Date of Birth: [birth year]   Medicare Important Message Given:  Yes     [name] 11/15/2020, 8:17 AM

## 2020-11-15 NOTE — Progress Notes (Signed)
[facility] Progress Note   70 year old gentleman with history end-stage renal disease chronic hypotension combined congestive heart failure.  History of cirrhosis hypoxic respiratory failure metastatic prostate cancer.  He received his regular [facility] treatment at [facility] [place].  He was admitted with hypotension and dyspnea. BP very soft and unable to UF much.  Assessment/ Plan:   1)  ESRD:  -outpatient orders: [facility], MWF, EDW 75 kg, left upper extremity AVG (also has TDC), 4 hours, 400/600, 3K, 2.5 calcium, heparin 3800unit bolus (no maintenance). HD  11/11/2020 and then 9/27 (.net UF only -762 ml)   Dialysate has been very difficult because of his tenuous low blood pressures despite Midodrine, low temps, multiple doses of albumin with waxing and waning mental status changes during dialysis treatments which make it very difficult to assess the patient properly.  I would like to discontinue any further dialysis as the renal replacement therapy is not even able to keep the patient even and he  will just continue to accumulate fluid and be in positive sodium balance. Also the dialysis is not improving his quality of life at all.  No further dialysis at this time.  Much appreciate palliative discussions.      2) Pleural effusion, left -  thora;per primary, HD will not be able to fix this   3) Cirrhosis w/ ascites - para, per primary   4)  Volume/ chronic hypotension: EDW 75kg.  Ultrafiltration limited by hypotension.  Currently receiving 15 mg of midodrine 3 times daily     5)  Anemia of Chronic Kidney Disease: Drop in hemoglobin 8.0.  ESA added   6) Secondary Hyperparathyroidism/Hyperphosphatemia: Appears to be in goal.   7) Vascular access: has LUE AVG that is being cannulated make arrangements for [facility] to be removed next week but if he goes on hospice/ comfort care I would leave that in for medication administration and lessen discomfort  of blood draws, IV attempts, etc.  Subjective:   Confused, mild shortness of breath.   Objective:   BP (!) 82/54 (BP Location: Right Arm)   Pulse 90   Temp 97.8 F (36.6 C) (Oral)   Resp 20   Wt 81 kg   SpO2 100%   BMI 24.22 kg/m   Intake/Output Summary (Last 24 hours) at 11/15/2020 1105 Last data filed at 11/14/2020 1647 Gross per 24 hour  Intake 38.33 ml  Output 762 ml  Net -723.67 ml   Weight change: -7.3 kg  Physical Exam: CWC:[id number] ill appearing, laying at a 30deg angle  in bed CVS:rrr Resp:decreased air entry left fields, normal WOB, on 2LNC HYW:VPXTGGYIR, soft, nt SWN:IOEV bka, trace to 1+ edema  LE Neuro: awake HD access: RIJ TDC, LUE AVG (+bruit)  Imaging: No results found.  Labs: BMET Recent Labs  Lab 11/10/20 0109 11/11/20 0112 11/11/20 0430 11/12/20 0101 11/13/20 0403 11/14/20 0150 11/15/20 0132  NA 133* 132* 133* 135 133* 132* 132*  K 4.0 4.4 4.3 4.0 3.9 4.2 3.7  CL 95* 95* 96* 97* 96* 96* 96*  CO2 28 27 27 25 26 22 23   GLUCOSE 138* 129* 138* 143* 126* 142* 137*  BUN 32* 37* 39* 28* 36* 46* 34*  CREATININE 4.42* 4.85* 4.99* 3.87* 4.77* 5.34* 4.18*  CALCIUM 8.5* 8.5* 8.6* 8.7* 8.5* 8.4* 8.1*  PHOS 4.0 4.3 4.3 3.1 3.8 3.9 3.1   CBC Recent Labs  Lab 11/11/20 0430  WBC 6.7  HGB 8.3*  HCT 25.7*  MCV 97.3  PLT 199    Medications:     aspirin  325 mg Oral Daily   atorvastatin  80 mg Oral QHS   Chlorhexidine Gluconate Cloth  6 each Topical Q0600   clopidogrel  75 mg Oral Daily   darbepoetin (ARANESP) injection - DIALYSIS  150 mcg Intravenous Q Wed-HD   docusate sodium  100 mg Oral Daily   feeding supplement (NEPRO CARB STEADY)  237 mL Oral BID BM   gabapentin  100 mg Oral Daily   And   gabapentin  200 mg Oral QHS   levETIRAcetam  500 mg Oral BID   lidocaine  1 application Topical Daily   midodrine  15 mg Oral TID WC   pantoprazole  20 mg Oral Daily      Otelia Santee, MD 11/15/2020, 11:05 AM

## 2020-11-15 NOTE — Progress Notes (Signed)
Palliative -  Family meeting planned for tomorrow at 3:30pm.   Mariana Kaufman, AGNP-C Palliative Medicine  No charge

## 2020-11-16 DIAGNOSIS — N186 End stage renal disease: Secondary | ICD-10-CM | POA: Diagnosis not present

## 2020-11-16 DIAGNOSIS — I959 Hypotension, unspecified: Secondary | ICD-10-CM | POA: Diagnosis not present

## 2020-11-16 DIAGNOSIS — Z66 Do not resuscitate: Secondary | ICD-10-CM

## 2020-11-16 DIAGNOSIS — Z515 Encounter for palliative care: Secondary | ICD-10-CM | POA: Diagnosis not present

## 2020-11-16 DIAGNOSIS — Z7189 Other specified counseling: Secondary | ICD-10-CM | POA: Diagnosis not present

## 2020-11-16 LAB — RENAL FUNCTION PANEL
Albumin: 2.7 g/dL — ABNORMAL LOW (ref 3.5–5.0)
Anion gap: 12 (ref 5–15)
BUN: 46 mg/dL — ABNORMAL HIGH (ref 8–23)
CO2: 26 mmol/L (ref 22–32)
Calcium: 8.5 mg/dL — ABNORMAL LOW (ref 8.9–10.3)
Chloride: 95 mmol/L — ABNORMAL LOW (ref 98–111)
Creatinine, Ser: 4.89 mg/dL — ABNORMAL HIGH (ref 0.61–1.24)
GFR, Estimated: 12 mL/min — ABNORMAL LOW (ref >60)
Glucose, Bld: 155 mg/dL — ABNORMAL HIGH (ref 70–99)
Phosphorus: 3.8 mg/dL (ref 2.5–4.6)
Potassium: 4 mmol/L (ref 3.5–5.1)
Sodium: 133 mmol/L — ABNORMAL LOW (ref 135–145)

## 2020-11-16 MED ORDER — POLYVINYL ALCOHOL 1.4 % OP SOLN
1.0000 [drp] | Freq: Four times a day (QID) | OPHTHALMIC | Status: DC | PRN
  Filled 2020-11-16: qty 15

## 2020-11-16 MED ORDER — LORAZEPAM 2 MG/ML IJ SOLN: 1.0000 mg | INTRAMUSCULAR | Status: DC | PRN

## 2020-11-16 MED ORDER — HALOPERIDOL LACTATE 2 MG/ML PO CONC
0.5000 mg | ORAL | Status: DC | PRN
  Filled 2020-11-16: qty 0.3

## 2020-11-16 MED ORDER — LORAZEPAM 2 MG/ML PO CONC: 1.0000 mg | ORAL | Status: DC | PRN

## 2020-11-16 MED ORDER — GLYCOPYRROLATE 1 MG PO TABS
1.0000 mg | ORAL_TABLET | ORAL | Status: DC | PRN
  Filled 2020-11-16: qty 1

## 2020-11-16 MED ORDER — BIOTENE DRY MOUTH MT LIQD: 15.0000 mL | OROMUCOSAL | Status: DC | PRN

## 2020-11-16 MED ORDER — HALOPERIDOL 0.5 MG PO TABS
0.5000 mg | ORAL_TABLET | ORAL | Status: DC | PRN
  Filled 2020-11-16: qty 1

## 2020-11-16 MED ORDER — GLYCOPYRROLATE 0.2 MG/ML IJ SOLN: 0.2000 mg | INTRAMUSCULAR | Status: DC | PRN

## 2020-11-16 MED ORDER — HYDROMORPHONE HCL 1 MG/ML IJ SOLN: 0.5000 mg | INTRAMUSCULAR | Status: DC | PRN

## 2020-11-16 MED ORDER — HALOPERIDOL LACTATE 5 MG/ML IJ SOLN: 0.5000 mg | INTRAMUSCULAR | Status: DC | PRN

## 2020-11-16 MED ORDER — LORAZEPAM 1 MG PO TABS: 1.0000 mg | ORAL_TABLET | ORAL | Status: DC | PRN

## 2020-11-16 NOTE — Progress Notes (Signed)
Daily Progress Note   Patient Name: George Burgess       Date: 11/16/2020 DOB: Jul 14, 1950  Age: 70 y.o. MRN#: [id number] Attending Physician: Shawna Clamp, MD Primary Care Physician: Caprice Renshaw, MD Admit Date: 11/03/2020  Reason for Consultation/Follow-up: Establishing goals of care  Subjective: Sleeping - wakes to physical stimulation - tells me his head hurts but is relieved with repositioning. Denies shortness of breath.   Length of Stay: 13  Current Medications: Scheduled Meds:   aspirin  325 mg Oral Daily   atorvastatin  80 mg Oral QHS   Chlorhexidine Gluconate Cloth  6 each Topical Q0600   clopidogrel  75 mg Oral Daily   darbepoetin (ARANESP) injection - DIALYSIS  150 mcg Intravenous Q Wed-HD   docusate sodium  100 mg Oral Daily   feeding supplement (NEPRO CARB STEADY)  237 mL Oral BID BM   gabapentin  100 mg Oral Daily   And   gabapentin  200 mg Oral QHS   levETIRAcetam  500 mg Oral BID   lidocaine  1 application Topical Daily   midodrine  15 mg Oral TID WC   pantoprazole  20 mg Oral Daily    Continuous Infusions:   PRN Meds: acetaminophen **OR** acetaminophen, albuterol, ondansetron **OR** ondansetron (ZOFRAN) IV  Physical Exam Constitutional:      General: He is not in acute distress.    Comments: lethargic  Pulmonary:     Effort: Pulmonary effort is normal.  Skin:    General: Skin is warm and dry.  Neurological:     Mental Status: He is disoriented.            Vital Signs: BP (!) 71/51 (BP Location: Right Arm)   Pulse 86   Temp 97.7 F (36.5 C) (Oral)   Resp 20   Wt 81.5 kg   SpO2 100%   BMI 24.37 kg/m  SpO2: SpO2: 100 % O2 Device: O2 Device: Nasal Cannula O2 Flow Rate: O2 Flow Rate (L/min): 3 L/min  Intake/output summary: No intake or output data in the  24 hours ending 11/16/20 1244 LBM: Last BM Date: 11/15/20 Baseline Weight: Weight:  (UNAVLE TO OBTAIN) Most recent weight: Weight: 81.5 kg       Palliative Assessment/Data: PPS 20%      Patient Active Problem List   Diagnosis Date Noted   Pressure injury of skin 11/04/2020   Chronic respiratory failure with hypoxia ([place]) 11/04/2020   Hx of BKA ([place]) 11/04/2020   Hypotension 11/03/2020   Lactic acidosis 11/03/2020   Pleural effusion on left 11/03/2020   Type 2 diabetes mellitus with chronic kidney disease on chronic dialysis ([place]) 10/09/2020   Acute respiratory failure with hypoxia ([place]) 10/09/2020   Cirrhosis of liver with ascites ([place]) 10/09/2020   Prostate cancer, primary, with metastasis from prostate to other site [facility]) 05/11/2020   CAD (coronary artery disease)    Urinary tract infection without hematuria    Tachycardia    Chest pain 07/13/2019   Epilepsy ([place]) 07/13/2019   Altered mental status    Atrial flutter ([place])    History of ST elevation myocardial infarction (STEMI) 06/20/2019   Dementia ([place])    Chronic combined systolic (  congestive) and diastolic (congestive) heart failure (HCC)    PVD (peripheral vascular disease) (HCC)    History of stroke    Seizure ([place]) 09/11/2017   ESRD on hemodialysis ([place]) 09/11/2017   Elevated troponin 09/11/2017   Atrial fibrillation, chronic 09/11/2017   Anemia due to chronic kidney disease 09/11/2017   Essential hypertension 09/11/2017   Hypoglycemia 09/11/2017   Syncope 09/10/2017   Blindness    Bloody stools    Encounter for nasogastric (NG) tube placement    Lower GI bleed    Malnutrition of moderate degree 09/05/2016   Acute GI bleeding 09/04/2016    Palliative Care Assessment & Plan   HPI: 70 y.o. male  with past medical history of ESRD on HD, liver cirrhosis, CHF, metastatic prostate cancer, admitted on 11/03/2020 with shortness of breath. Workup revealed acute hypoxic respiratory failure due to L pleural  effusion in the setting of missed dialysis. Admission and recovery have been complicated by hypotension. Palliative medicine consulted for [place].    Assessment: Patient unable to participate in [place] conversation.  Met with patient's 4 siblings including brother [name].  We discuss patient's decline and multiple hospitalizations recently.  They share with me they understand that dialysis is no longer being offered. We discuss the reasons for this and why it is unsafe. We discuss multiple attempts at HD and trying albumin and midrodine to assist with completing HD but not able to continue. They express understanding and gratitude for nephrologists efforts. We discuss that with his ESRD and cessation of dialysis that his life expectancy is likely < 2 weeks. They tell me their mother passed away following HD cessation so they understand process. We discuss transitioning his care to focus on comfort. They agree. We discuss transition to hospice facility - they agree and request [place] hospice facility as more family is in that area. Patient was also being seen by outpatient palliative connected to this hospice. We discuss code status change to DNR and they agree. All questions and concerns addressed.   Updated TOC team, Dr. Dwyane Dee, and RN.   Recommendations/Plan: Transition to comfort measures only PRN medications added to assist in comfort, meds not necessary for comfort discontinued At this point able to take POs so will continue keppra - when unable to continue will add scheduled benzo Transfer to hospice facility in [facility] when bed available  Goals of Care and Additional Recommendations: Limitations on Scope of Treatment: comfort measures only  Code Status: DNR  Prognosis:  < 2 weeks  Discharge Planning: Hospice facility  Care plan was discussed with family, RN, MD, [facility]  Thank you for allowing the Palliative Medicine Team to assist in the care of this patient.   Total Time 40  minutes Prolonged Time Billed  no       Greater than 50%  of this time was spent counseling and coordinating care related to the above assessment and plan.  Juel Burrow, DNP, [facility] Palliative Medicine Team Team Phone # [phone]  Pager [phone]

## 2020-11-16 NOTE — Progress Notes (Signed)
PROGRESS NOTE    George Burgess  LKG:[id number] DOB: Jul 15, 1950 DOA: 11/03/2020 PCP: Caprice Renshaw, MD   Brief Narrative:  This 70 year old man with complicated PMH including ESRD, chronic hypotension, chronic combined CHF, cirrhosis, chronic hypoxic respiratory failure, metastatic prostate cancer, presented with shortness of breath.  Admitted for acute on chronic hypoxic respiratory failure, initially thought secondary to pleural effusion, however patient remains at baseline oxygen requirement despite presence of effusion.  Condition complicated by recently missed dialysis session.  He appears clinically stable with chronic hypotension, however he has not successfully dialyzed during hospitalization, no volume was able to be removed on 9/19.  Patient had another dialysis 9/27 -blood pressure remained soft even with albumin.  Unable to do much volume removal due to softer blood pressure. Palliative care was consulted-patient and family decided to remain full code with full scope of medical care.  If continue not to tolerate dialysis then palliative care needs to revisit and go to hospice home.   Unable to complete dialysis due to softer blood pressure.  Unable to remove extra fluid.  Thoracentesis ordered but doubt that will help. Patient is very high risk for deterioration and death-appropriate for comfort measures only.   9/23: Left thoracentesis with removal of 1.2 L of fluid, unable to take more fluid off as patient started coughing and becoming uncomfortable.  Initial labs seems exudative but cultures so far negative. CT chest was ordered because of his history of metastatic prostate cancer, that did not show lung lesions but did show worsening of osseous metastasis and liver nodularity.  Large volume ascites.   9/25: Paracentesis was done with removal of 3.6 L of peritoneal fluid for comfort reasons.  Preliminary lab seems transudate, likely secondary to ascites.   9/27: Significant  reaccumulation of ascitic fluid.  Blood pressure remained very soft and he appears very lethargic.  Per nephrology if they cannot do any UF with dialysis  then he will not be offered more dialysis.  Palliative care is on board and trying to arrange another family meeting.    Family meeting is scheduled today to discuss goals of care.  Assessment & Plan:   Principal Problem:   Hypotension Active Problems:   Seizure ([place])   ESRD on hemodialysis (HCC)   Atrial fibrillation, chronic   Anemia due to chronic kidney disease   Dementia (HCC)   Chronic combined systolic (congestive) and diastolic (congestive) heart failure (HCC)   PVD (peripheral vascular disease) (HCC)   CAD (coronary artery disease)   Prostate cancer, primary, with metastasis from prostate to other site [facility])   Type 2 diabetes mellitus with chronic kidney disease on chronic dialysis ([place])   Cirrhosis of liver with ascites ([place])   Pleural effusion on left   Pressure injury of skin   Chronic respiratory failure with hypoxia (HCC)   Hx of BKA (HCC)  Chronic hypotension: Patient does have history of chronically low blood pressure.   Patient is on midodrine 15 mg 3 times daily. BP remained soft ,  he is not a candidate for CRRT per nephrology.   Patient did have about 600 cc fluid taken off during dialysis 9/27. Continue midodrine Patient is appropriate for comfort measures only but family does not agree with that. Per nephrology if they cannot do any UF with dialysis -HD will not be an option for him. Palliative care was consulted.  Family meeting is scheduled today to discuss goals of care   Recurrent left pleural effusion: Currently stable on home  oxygen requirement of 2 L. S/P Left-sided thoracentesis- removal of 1.2 L of exudative fluid, preliminary cultures negative.   Unable to tolerate more removal, postprocedural chest x-ray with significant remaining pleural effusion. CT chest with large bilateral pleural  effusions, with basal atelectasis, large volume ascites and liver nodularity.  No pulmonary nodule noted but there is an extension of osseous metastatic disease. Seems more consistent with heart failure and ESRD as we are unable to remove fluid due to softer blood pressure.   Cirrhosis of liver with ascites: Imaging shows large volume ascites and anasarca Paracentesis was done 9/25 with removal of 3.6 L of transudative fluid.  Reaccumulating again. Continue to monitor   Type 2 diabetes with CKD on chronic dialysis: Hemoglobin A1c 6.6 Continue sliding scale   Chronic combined systolic and diastolic heart failure: Unable to remove fluid with dialysis due to hypotension which is causing worsening anasarca and fluid retention Hold Toprol given low blood pressure. Last EF noted to be around 20-25% with left ventricular global hypokinesis and moderate pulmonary hypertension at [facility] in 06/2020.   End-stage renal disease on dialysis: Patient is not a candidate for CRRT.  Previous hemodialysis limited by chronic hypotension. Dialysis has been very difficult because of his tenacious low blood pressure despite midodrine.   Nephrology recommended discontinue further dialysis as renal replacement therapy is not even able to keep the patient even and continue to accumulate fluid despite dialysis. Palliative care is on board family meeting is happening today to discuss goals of care  Chronic hypoxic respiratory failure: Continue supplemental oxygen.  Dementia: Appears stable, continue delirium precautions.  Atrial fibrillation chronic: Heart rate remains stable,  Patient is poor candidate for anticoagulation as per cardiology in the past. Keep Toprol on hold.   Seizure disorder: Continue Keppra and gabapentin.  Metastatic prostate cancer: Followed by urology, treated w/ [facility]. Repeat imaging with worsening of metastatic disease.   CAD (coronary artery disease) Continue aspirin and  statins   PVD (peripheral vascular disease) ([place]) S/p left BKA   Anemia due to chronic kidney disease Hemoglobin is stable. Continue with Aranesp    DVT prophylaxis: Heparin Code Status: Full code Family Communication: No family at bedside Disposition Plan:    Status is: Inpatient  Remains inpatient appropriate because:Inpatient level of care appropriate due to severity of illness  Dispo: The patient is from: Home              Anticipated d/c is to: SNF vs comfort measures.              Patient currently is not medically stable to d/c.   Difficult to place patient No   Consultants:  Nephrology Palliative care  Procedures: Antimicrobials:   Anti-infectives (From admission, onward)    None        Subjective: Patient was seen and examined at bedside.  Overnight events noted.   Patient is alert, oriented x2.  Patient reports he is aware that his overall prognosis is poor,  He is not going to survive long.  Objective: Vitals:   11/16/20 0416 11/16/20 0433 11/16/20 0812 11/16/20 1139  BP:  (!) 80/54 (!) 80/57 (!) 71/51  Pulse: 91 95 89 86  Resp: 19 18 18 20   Temp:  97.9 F (36.6 C) (!) 97.5 F (36.4 C) 97.7 F (36.5 C)  TempSrc:  Axillary Oral Oral  SpO2: 91% 93% 100% 100%  Weight:  81.5 kg     No intake or output data in the  24 hours ending 11/16/20 1342  Filed Weights   11/14/20 1255 11/14/20 1700 11/16/20 0433  Weight: 81.7 kg 81 kg 81.5 kg    Examination:  General exam: Appears calm and comfortable.  Not in any acute distress, right chest testio noted. Respiratory system: Clear to auscultation bilaterally, respiratory effort normal, RR 15 Cardiovascular system: S1-S2 heard, regular rate and rhythm, no murmur. Gastrointestinal system: Abdomen is distended, soft, nontender, BS+ Central nervous system: Alert and oriented x 2. No focal neurological deficits. Extremities: Left AV fistula noted,  not functioning, left BKA. Skin: No rashes, lesions or  ulcers Psychiatry:  Mood & affect appropriate.     Data Reviewed: I have personally reviewed following labs and imaging studies  CBC: Recent Labs  Lab 11/11/20 0430  WBC 6.7  HGB 8.3*  HCT 25.7*  MCV 97.3  PLT 263   Basic Metabolic Panel: Recent Labs  Lab 11/12/20 0101 11/13/20 0403 11/14/20 0150 11/15/20 0132 11/16/20 0109  NA 135 133* 132* 132* 133*  K 4.0 3.9 4.2 3.7 4.0  CL 97* 96* 96* 96* 95*  CO2 25 26 22 23 26   GLUCOSE 143* 126* 142* 137* 155*  BUN 28* 36* 46* 34* 46*  CREATININE 3.87* 4.77* 5.34* 4.18* 4.89*  CALCIUM 8.7* 8.5* 8.4* 8.1* 8.5*  PHOS 3.1 3.8 3.9 3.1 3.8   GFR: CrCl cannot be calculated (Unknown ideal weight.). Liver Function Tests: Recent Labs  Lab 11/12/20 0101 11/13/20 0403 11/14/20 0150 11/15/20 0132 11/16/20 0109  ALBUMIN 2.8* 2.6* 2.4* 2.8* 2.7*   No results for input(s): LIPASE, AMYLASE in the last 168 hours. No results for input(s): AMMONIA in the last 168 hours. Coagulation Profile: No results for input(s): INR, PROTIME in the last 168 hours. Cardiac Enzymes: No results for input(s): CKTOTAL, CKMB, CKMBINDEX, TROPONINI in the last 168 hours. BNP (last 3 results) No results for input(s): PROBNP in the last 8760 hours. HbA1C: No results for input(s): HGBA1C in the last 72 hours. CBG: No results for input(s): GLUCAP in the last 168 hours. Lipid Profile: No results for input(s): CHOL, HDL, LDLCALC, TRIG, CHOLHDL, LDLDIRECT in the last 72 hours. Thyroid Function Tests: No results for input(s): TSH, T4TOTAL, FREET4, T3FREE, THYROIDAB in the last 72 hours. Anemia Panel: No results for input(s): VITAMINB12, FOLATE, FERRITIN, TIBC, IRON, RETICCTPCT in the last 72 hours. Sepsis Labs: No results for input(s): PROCALCITON, LATICACIDVEN in the last 168 hours.  Recent Results (from the past 240 hour(s))  Body fluid culture w Gram Stain     Status: None   Collection Time: 11/09/20  4:07 PM   Specimen: Lung, Left; Pleural Fluid   Result Value Ref Range Status   Specimen Description FLUID PLEURAL LEFT  Final   Special Requests NONE  Final   Gram Stain   Final    FEW WBC PRESENT, PREDOMINANTLY MONONUCLEAR NO ORGANISMS SEEN    Culture   Final    NO GROWTH 3 DAYS Performed at [facility], [address] [place], [place] [address]    Report Status 11/12/2020 FINAL  Final  Culture, body fluid w Gram Stain-bottle     Status: None (Preliminary result)   Collection Time: 11/12/20 11:01 AM   Specimen: Peritoneal Washings  Result Value Ref Range Status   Specimen Description PERITONEAL FLUID  Final   Special Requests NONE  Final   Culture   Final    NO GROWTH 3 DAYS Performed at [facility] [address] [place], [place]  [address]    Report Status PENDING  Incomplete  Gram stain     Status: None   Collection Time: 11/12/20 11:01 AM   Specimen: Peritoneal Washings  Result Value Ref Range Status   Specimen Description PERITONEAL FLUID  Final   Special Requests NONE  Final   Gram Stain   Final    WBC PRESENT, PREDOMINANTLY MONONUCLEAR NO ORGANISMS SEEN CYTOSPIN SMEAR Performed at [facility], [place] [address], [place], [place] [address]    Report Status 11/12/2020 FINAL  Final    Radiology Studies: No results found.  Scheduled Meds:  aspirin  325 mg Oral Daily   atorvastatin  80 mg Oral QHS   Chlorhexidine Gluconate Cloth  6 each Topical Q0600   clopidogrel  75 mg Oral Daily   darbepoetin (ARANESP) injection - DIALYSIS  150 mcg Intravenous Q Wed-HD   docusate sodium  100 mg Oral Daily   feeding supplement (NEPRO CARB STEADY)  237 mL Oral BID BM   gabapentin  100 mg Oral Daily   And   gabapentin  200 mg Oral QHS   levETIRAcetam  500 mg Oral BID   lidocaine  1 application Topical Daily   midodrine  15 mg Oral TID WC   pantoprazole  20 mg Oral Daily   Continuous Infusions:   LOS: 13 days    Time spent: 25 mins    Shawna Clamp, MD [facility]   If 7PM-7AM,  please contact night-coverage

## 2020-11-16 NOTE — Progress Notes (Signed)
[facility] Progress Note   70 year old gentleman with history end-stage renal disease chronic hypotension combined congestive heart failure.  History of cirrhosis hypoxic respiratory failure metastatic prostate cancer.  He received his regular [facility] [place].  He was admitted with hypotension and dyspnea. BP very soft and unable to UF much.  Assessment/ Plan:   1)  ESRD:  -outpatient orders: [facility], MWF, EDW 75 kg, left upper extremity AVG (also has TDC), 4 hours, 400/600, 3K, 2.5 calcium, heparin 3800unit bolus (no maintenance). HD  11/11/2020 and then 9/27 (.net UF only -762 ml)   Dialysate has been very difficult because of his tenuous low blood pressures despite Midodrine, low temps, multiple doses of albumin with waxing and waning mental status changes during dialysis treatments which make it very difficult to assess the patient properly.  I would like to discontinue any further dialysis as the renal replacement therapy is not even able to keep the patient even and he  will just continue to accumulate fluid and be in positive sodium balance. Also the dialysis is not improving his quality of life at all.  No further dialysis  will be planned at this time.  Much appreciate palliative discussions and will await update from the family meeting this afternoon.      2) Pleural effusion, left -  thora;per primary, HD will not be able to fix this   3) Cirrhosis w/ ascites - para, per primary   4)  Volume/ chronic hypotension: EDW 75kg.  Ultrafiltration limited by hypotension.  Currently receiving 15 mg of midodrine 3 times daily     5)  Anemia of Chronic Kidney Disease: Drop in hemoglobin 8.0.  ESA added   6) Secondary Hyperparathyroidism/Hyperphosphatemia: Appears to be in goal.   7) Vascular access: has LUE AVG that is being cannulated make arrangements for [facility] to be removed next week but if he goes on hospice/ comfort  care I would leave that in for medication administration and lessen discomfort of blood draws, IV attempts, etc.  Subjective:   Confused, mild shortness of breath. Denies CP.   Objective:   BP (!) 80/57 (BP Location: Right Arm)   Pulse 89   Temp (!) 97.5 F (36.4 C) (Oral)   Resp 18   Wt 81.5 kg   SpO2 100%   BMI 24.37 kg/m  No intake or output data in the 24 hours ending 11/16/20 1022  Weight change: -0.2 kg  Physical Exam: AST:MHDQQIWLNLG ill appearing, laying at a 30deg angle  in bed CVS:rrr Resp:decreased air entry left fields, normal WOB, on 2LNC XQJ:JHERDEYCX, soft, nt KGY:JEHU bka, trace to 1+ edema  LE Neuro: awake HD access: RIJ TDC, LUE AVG (+bruit)  Imaging: No results found.  Labs: BMET Recent Labs  Lab 11/11/20 0112 11/11/20 0430 11/12/20 0101 11/13/20 0403 11/14/20 0150 11/15/20 0132 11/16/20 0109  NA 132* 133* 135 133* 132* 132* 133*  K 4.4 4.3 4.0 3.9 4.2 3.7 4.0  CL 95* 96* 97* 96* 96* 96* 95*  CO2 27 27 25 26 22 23 26   GLUCOSE 129* 138* 143* 126* 142* 137* 155*  BUN 37* 39* 28* 36* 46* 34* 46*  CREATININE 4.85* 4.99* 3.87* 4.77* 5.34* 4.18* 4.89*  CALCIUM 8.5* 8.6* 8.7* 8.5* 8.4* 8.1* 8.5*  PHOS 4.3 4.3 3.1 3.8 3.9 3.1 3.8   CBC Recent Labs  Lab 11/11/20 0430  WBC 6.7  HGB 8.3*  HCT 25.7*  MCV 97.3  PLT 199  Medications:     aspirin  325 mg Oral Daily   atorvastatin  80 mg Oral QHS   Chlorhexidine Gluconate Cloth  6 each Topical Q0600   clopidogrel  75 mg Oral Daily   darbepoetin (ARANESP) injection - DIALYSIS  150 mcg Intravenous Q Wed-HD   docusate sodium  100 mg Oral Daily   feeding supplement (NEPRO CARB STEADY)  237 mL Oral BID BM   gabapentin  100 mg Oral Daily   And   gabapentin  200 mg Oral QHS   levETIRAcetam  500 mg Oral BID   lidocaine  1 application Topical Daily   midodrine  15 mg Oral TID WC   pantoprazole  20 mg Oral Daily      Otelia Santee, MD 11/16/2020, 10:22 AM

## 2020-11-17 DIAGNOSIS — N186 End stage renal disease: Secondary | ICD-10-CM | POA: Diagnosis not present

## 2020-11-17 DIAGNOSIS — Z992 Dependence on renal dialysis: Secondary | ICD-10-CM | POA: Diagnosis not present

## 2020-11-17 DIAGNOSIS — I959 Hypotension, unspecified: Secondary | ICD-10-CM | POA: Diagnosis not present

## 2020-11-17 DIAGNOSIS — Z515 Encounter for palliative care: Secondary | ICD-10-CM | POA: Diagnosis not present

## 2020-11-17 DIAGNOSIS — Z66 Do not resuscitate: Secondary | ICD-10-CM | POA: Diagnosis not present

## 2020-11-17 LAB — CULTURE, BODY FLUID W GRAM STAIN -BOTTLE: Culture: NO GROWTH

## 2020-11-17 MED ORDER — ALBUTEROL SULFATE (2.5 MG/3ML) 0.083% IN NEBU: 2.5000 mg | INHALATION_SOLUTION | Freq: Four times a day (QID) | RESPIRATORY_TRACT | 12 refills | Status: AC | PRN

## 2020-11-17 MED ORDER — HALOPERIDOL 0.5 MG PO TABS: 0.5000 mg | ORAL_TABLET | ORAL | 0 refills | Status: AC | PRN

## 2020-11-17 MED ORDER — GLYCOPYRROLATE 1 MG PO TABS: 1.0000 mg | ORAL_TABLET | ORAL | 0 refills | Status: AC | PRN

## 2020-11-17 NOTE — Progress Notes (Signed)
Patient has transitioned to comfort measures only with no more plans for HD. We will sign off at this time. Please give [place] a call if further assistance is needed.

## 2020-11-17 NOTE — Care Management Important Message (Signed)
Important Message  Patient Details  Name: George Burgess MRN: [id number] Date of Birth: 1950/03/03   Medicare Important Message Given:  Yes     [name] 11/17/2020, 9:44 AM

## 2020-11-17 NOTE — Social Work (Signed)
Cheri w/ [facility]  - will informed CSW if patient is able to admit today.   CSW will continue to follow and assist with discharge planning.  Thurmond Butts, MSW, LCSW Clinical Social Worker

## 2020-11-17 NOTE — Progress Notes (Signed)
Informed by CSW that transport has been called.  Report given to receiving RN at [place].

## 2020-11-17 NOTE — Progress Notes (Signed)
Chart reviewed. Contacted [facility] in [place] to advise staff that pt is being transitioned to comfort care.   Melven Sartorius Renal Navigator [phone]

## 2020-11-17 NOTE — TOC Transition Note (Signed)
Transition of Care [facility]) - CM/SW Discharge Note   Patient Details  Name: George Burgess MRN: [id number] Date of Birth: 1950/09/10  Transition of Care [facility]) CM/SW Contact:  Vinie Sill, LCSW Phone Number: 11/17/2020, 11:25 AM   Clinical Narrative:     Patient will Discharge to: [facility] Home at  [facility]  Discharge Date: 11/17/2020 Family Notified: [name]- brother, left voice message Transport By: Corey Harold  Per MD patient is ready for discharge. RN, patient, and facility notified of discharge. Discharge Summary sent to facility. RN given number for [phone]. Ambulance transport requested for patient.   Clinical Social Worker signing off.  Thurmond Butts, MSW, LCSW Clinical Social Worker     Final next level of care: [place] Barriers to Discharge: Barriers Resolved   Patient Goals and CMS Choice        Discharge Placement              Patient chooses bed at:  ([facility] Patient to be transferred to facility by: [place] Name of family member notified: [name]- left voice message Patient and family notified of of transfer: 11/17/20  Discharge Plan and Services                                     Social Determinants of Health (SDOH) Interventions     Readmission Risk Interventions No flowsheet data found.

## 2020-11-17 NOTE — Progress Notes (Signed)
Pt less verbal this moring than yesterday.  Able to swallow gabapentin with grits.  Keppra broken in half and attempted, but first half remained in Pts mouth requiring several bits to swallow.  Did not attempt second half as Pt became drowsy.  Will dont plan of care

## 2020-11-17 NOTE — Discharge Instructions (Signed)
Patient is being discharged to [facility] for comfort measures/hospice care

## 2020-11-17 NOTE — Progress Notes (Signed)
Daily Progress Note   Patient Name: George Burgess       Date: 11/17/2020 DOB: Apr 12, 1950  Age: 70 y.o. MRN#: [id number] Attending Physician: Shawna Clamp, MD Primary Care Physician: Caprice Renshaw, MD Admit Date: 11/03/2020  Reason for Consultation/Follow-up: Establishing goals of care  Subjective: Sleeping - wakes to physical stimulation - denies pain/shortness of breath. No family at bedside.   Length of Stay: 14  Current Medications: Scheduled Meds:   feeding supplement (NEPRO CARB STEADY)  237 mL Oral BID BM   gabapentin  100 mg Oral Daily   And   gabapentin  200 mg Oral QHS   levETIRAcetam  500 mg Oral BID    Continuous Infusions:   PRN Meds: acetaminophen **OR** acetaminophen, albuterol, antiseptic oral rinse, glycopyrrolate **OR** glycopyrrolate **OR** glycopyrrolate, haloperidol **OR** haloperidol **OR** haloperidol lactate, HYDROmorphone (DILAUDID) injection, LORazepam **OR** LORazepam **OR** LORazepam, ondansetron **OR** ondansetron (ZOFRAN) IV, polyvinyl alcohol  Physical Exam Constitutional:      General: He is not in acute distress.    Comments: lethargic  Pulmonary:     Effort: Pulmonary effort is normal.  Skin:    General: Skin is warm and dry.  Neurological:     Mental Status: He is disoriented.            Vital Signs: BP (!) 73/57 (BP Location: Right Arm)   Pulse 76   Temp 98 F (36.7 C) (Oral)   Resp 20   Wt 81.5 kg   SpO2 91%   BMI 24.37 kg/m  SpO2: SpO2: 91 % O2 Device: O2 Device: Nasal Cannula O2 Flow Rate: O2 Flow Rate (L/min): 3 L/min  Intake/output summary:  Intake/Output Summary (Last 24 hours) at 11/17/2020 0908 Last data filed at 11/16/2020 2200 Gross per 24 hour  Intake 360 ml  Output --  Net 360 ml   LBM: Last BM Date: 11/15/20 Baseline  Weight: Weight:  (UNAVLE TO OBTAIN) Most recent weight: Weight: 81.5 kg       Palliative Assessment/Data: PPS 20%    Flowsheet Rows    Flowsheet Row Most Recent Value  Intake Tab   Referral Department Hospitalist  Unit at Time of Referral Intermediate Care Unit  Palliative Care Primary Diagnosis Nephrology  Date Notified 11/04/20  Palliative Care Type Return patient Palliative Care  Reason for referral Clarify Goals of Care  Date of Admission 11/03/20  Date first seen by Palliative Care 11/07/20  # of days Palliative referral response time 3 Day(s)  # of days IP prior to Palliative referral 1  Clinical Assessment   Psychosocial & Spiritual Assessment   Palliative Care Outcomes        Patient Active Problem List   Diagnosis Date Noted   Pressure injury of skin 11/04/2020   Chronic respiratory failure with hypoxia ([place]) 11/04/2020   Hx of BKA ([place]) 11/04/2020   Hypotension 11/03/2020   Lactic acidosis 11/03/2020   Pleural effusion on left 11/03/2020   Type 2 diabetes mellitus with chronic kidney disease on chronic dialysis ([place]) 10/09/2020   Acute respiratory failure with hypoxia ([place]) 10/09/2020   Cirrhosis of liver with ascites ([place]) 10/09/2020   Prostate cancer, primary, with metastasis from prostate to other site [facility])  05/11/2020   CAD (coronary artery disease)    Urinary tract infection without hematuria    Tachycardia    Chest pain 07/13/2019   Epilepsy ([place]) 07/13/2019   Altered mental status    Atrial flutter ([place])    History of ST elevation myocardial infarction (STEMI) 06/20/2019   Dementia (HCC)    Chronic combined systolic (congestive) and diastolic (congestive) heart failure (HCC)    PVD (peripheral vascular disease) (HCC)    History of stroke    Seizure ([place]) 09/11/2017   ESRD on hemodialysis ([place]) 09/11/2017   Elevated troponin 09/11/2017   Atrial fibrillation, chronic 09/11/2017   Anemia due to chronic kidney disease 09/11/2017   Essential  hypertension 09/11/2017   Hypoglycemia 09/11/2017   Syncope 09/10/2017   Blindness    Bloody stools    Encounter for nasogastric (NG) tube placement    Lower GI bleed    Malnutrition of moderate degree 09/05/2016   Acute GI bleeding 09/04/2016    Palliative Care Assessment & Plan   HPI: 70 y.o. male  with past medical history of ESRD on HD, liver cirrhosis, CHF, metastatic prostate cancer, admitted on 11/03/2020 with shortness of breath. Workup revealed acute hypoxic respiratory failure due to L pleural effusion in the setting of missed dialysis. Admission and recovery have been complicated by hypotension. Palliative medicine consulted for [place].    Assessment: Continues on full comfort measures. Awaiting hospice facility bed. Medications reviewed - no PRN meds utilized.  Patient appears comfortable - no s/s of discomfort or distress. Signed DNR placed on chart. Spoke with RN - no concerns.   Recommendations/Plan: Continue comfort measures only PRN medications available to promote comfort At this point able to take POs so will continue keppra - when unable to continue will add scheduled benzo Transfer to hospice facility in [facility] when bed available Signed DNR on chart  Goals of Care and Additional Recommendations: Limitations on Scope of Treatment: comfort measures only  Code Status: DNR  Prognosis:  < 2 weeks  Discharge Planning: Hospice facility  Care plan was discussed with RN  Thank you for allowing the Palliative Medicine Team to assist in the care of this patient.   Total Time 15 minutes Prolonged Time Billed  no       Greater than 50%  of this time was spent counseling and coordinating care related to the above assessment and plan.  Juel Burrow, DNP, [facility] Palliative Medicine Team Team Phone # [phone]  Pager [phone]

## 2020-11-17 NOTE — Discharge Summary (Addendum)
Physician Discharge Summary  George Burgess GYB:[id number] DOB: Dec 30, 1950 DOA: 11/03/2020  PCP: Caprice Renshaw, MD  Admit date: 11/03/2020.  Discharge date: 11/17/2020.  Admitted From: Home.  Disposition: Hospice at [facility].  Recommendations for Outpatient Follow-up:  Patient is being discharged to [facility] for comfort measures/hospice care  Home Health: [facility] [place] Equipment/Devices: Home oxygen  Discharge Condition: Fair CODE STATUS: DNR Diet recommendation:   Brief Summary / Hospital course: This 70 year old man with complicated PMH including ESRD on HD , chronic hypotension, chronic combined CHF, cirrhosis, chronic hypoxic respiratory failure, metastatic prostate cancer, presented with shortness of breath.  He was admitted for acute on chronic hypoxic respiratory failure, initially thought secondary to pleural effusion, however patient remains at baseline oxygen requirement despite presence of effusion.  Nephrology was consulted.  Patient was started on hemodialysis but he could not complete dialysis because of having low blood pressure.  Blood pressure continued to remain low despite giving albumin infusions and continuation of midodrine.  Patient also underwent thoracocentesis, paracentesis but has ongoing recurrence of ascites and pleural effusions.  Nephrology recommended patient is not a candidate for further dialysis since this is not going to improve any change in volume status. Initially family recommended to continue full scope of care but then they realize patient is not making any improvement.  Family meeting was arranged and patient was made comfort care.  Patient is accepted in the [facility] [place].  Patient is being discharged for comfort measures.Marland Kitchen  He was managed for below problems.   Discharge Diagnoses:  Principal Problem:   Hypotension Active Problems:   Seizure ([place])   ESRD on hemodialysis (HCC)   Atrial fibrillation,  chronic   Anemia due to chronic kidney disease   Dementia (HCC)   Chronic combined systolic (congestive) and diastolic (congestive) heart failure (HCC)   PVD (peripheral vascular disease) (HCC)   CAD (coronary artery disease)   Prostate cancer, primary, with metastasis from prostate to other site [facility])   Type 2 diabetes mellitus with chronic kidney disease on chronic dialysis ([place])   Cirrhosis of liver with ascites ([place])   Pleural effusion on left   Pressure injury of skin   Chronic respiratory failure with hypoxia (HCC)   Hx of BKA (HCC)  Chronic hypotension: Continue midodrine 15 mg 3 times daily.   Recurrent left pleural effusion: Currently stable on home oxygen requirement of 2 L. S/P Left-sided thoracentesis- removal of 1.2 L of exudative fluid, preliminary cultures negative.   Unable to tolerate more removal, postprocedural chest x-ray with significant remaining pleural effusion. CT chest with large bilateral pleural effusions, with basal atelectasis, large volume ascites and liver nodularity.  No pulmonary nodule noted but there is an extension of osseous metastatic disease. Seems more consistent with heart failure and ESRD as we are unable to remove fluid due to softer blood pressure.   Cirrhosis of liver with ascites: Imaging shows large volume ascites and anasarca Paracentesis was done 9/25 with removal of 3.6 L of transudative fluid.  Reaccumulating again.    Type 2 diabetes with CKD on chronic dialysis: Hemoglobin A1c 6.6 Continue sliding scale   Chronic combined systolic and diastolic heart failure: Unable to remove fluid with dialysis due to hypotension which is causing worsening anasarca and fluid retention Hold Toprol given low blood pressure. Last EF noted to be around 20-25% with left ventricular global hypokinesis and moderate pulmonary hypertension at [facility] in 06/2020.   End-stage renal disease  on dialysis: Patient is not a candidate for CRRT.   Previous hemodialysis limited by chronic hypotension. Dialysis has been very difficult because of his tenacious low blood pressure despite midodrine.   Nephrology recommended discontinue further dialysis as renal replacement therapy is not even able to keep the patient even and continue to accumulate fluid despite dialysis. Patient is transition to comfort measures.  Hemodialysis discontinued.   Chronic hypoxic respiratory failure: Continue supplemental oxygen.   Dementia: Appears stable, continue delirium precautions.   Atrial fibrillation chronic: Heart rate remains stable,  Patient is poor candidate for anticoagulation as per cardiology in the past. Keep Toprol on hold.   Seizure disorder: Continue Keppra but DC gabapentin   Metastatic prostate cancer: Followed by urology, treated w/ [facility]. Repeat imaging with worsening of metastatic disease.   CAD (coronary artery disease) DC aspirin and statins   PVD (peripheral vascular disease) (HCC) S/p left BKA   Anemia due to chronic kidney disease Hemoglobin is stable. Discontinue Aranesp      Discharge Instructions  Discharge Instructions     Call MD for:  difficulty breathing, headache or visual disturbances   Complete by: As directed    Call MD for:  persistant nausea and vomiting   Complete by: As directed    Call MD for:  severe uncontrolled pain   Complete by: As directed    Diet - low sodium heart healthy   Complete by: As directed    Diet - low sodium heart healthy   Complete by: As directed    Diet full liquid   Complete by: As directed    Discharge instructions   Complete by: As directed    Patient is being discharged to [facility] for comfort measures/hospice care   Discharge wound care:   Complete by: As directed    Follow as per hospice facility.   Discharge wound care:   Complete by: As directed    Follow-up hospice care   Increase activity slowly   Complete by: As directed     Increase activity slowly   Complete by: As directed       Allergies as of 11/17/2020   No Known Allergies      Medication List     STOP taking these medications    aspirin 325 MG tablet   atorvastatin 80 MG tablet Commonly known as: LIPITOR   feeding supplement (NEPRO CARB STEADY) Liqd   ferrous gluconate 324 MG tablet Commonly known as: FERGON   gabapentin 100 MG capsule Commonly known as: NEURONTIN   lidocaine 4 % cream Commonly known as: LMX   metoprolol succinate 25 MG 24 hr tablet Commonly known as: TOPROL-XL   Nephro Vitamins 0.8 MG Tabs   nitroGLYCERIN 0.4 MG SL tablet Commonly known as: NITROSTAT   omeprazole 10 MG capsule Commonly known as: PRILOSEC       TAKE these medications    acetaminophen 325 MG tablet Commonly known as: TYLENOL Take 650 mg by mouth in the morning, at noon, and at bedtime.   albuterol (2.5 MG/3ML) 0.083% nebulizer solution Commonly known as: PROVENTIL Take 3 mLs (2.5 mg total) by nebulization every 6 (six) hours as needed for wheezing or shortness of breath.   docusate sodium 100 MG capsule Commonly known as: COLACE Take 100 mg by mouth daily.   glycopyrrolate 1 MG tablet Commonly known as: ROBINUL Take 1 tablet (1 mg total) by mouth every 4 (four) hours as needed (excessive secretions).   haloperidol  0.5 MG tablet Commonly known as: HALDOL Take 1 tablet (0.5 mg total) by mouth every 4 (four) hours as needed for agitation (or delirium).   levETIRAcetam 500 MG tablet Commonly known as: KEPPRA Take 1 tablet (500 mg total) by mouth 2 (two) times daily.   midodrine 10 MG tablet Commonly known as: PROAMATINE Take 1 tablet (10 mg total) by mouth 3 (three) times daily with meals. Hold for sbp greater than 330 or diastolic greater than 90   OXYGEN Inhale 1-5 L into the lungs as needed (May titrate to keep stats >90%).               Discharge Care Instructions  (From admission, onward)           Start      Ordered   11/17/20 0000  Discharge wound care:       Comments: Follow as per hospice facility.   11/17/20 1035   11/17/20 0000  Discharge wound care:       Comments: Follow-up hospice care   11/17/20 1105            Follow-up Information     Caprice Renshaw, MD Follow up in 1 week(s).   Specialty: Internal Medicine Contact information: [address] [place] [address] [phone]         Troy Sine, MD .   Specialty: Cardiology Contact information: [address] [place] [address] [phone]                No Known Allergies  Consultations: Palliative care Nephrology   Procedures/Studies: DG Chest 1 View  Result Date: 11/09/2020 CLINICAL DATA:  Status post left-sided thoracentesis. EXAM: CHEST  1 VIEW COMPARISON:  November 03, 2020 FINDINGS: There is stable right-sided venous catheter positioning. A radiopaque vascular stent is seen overlying the left apex and left axilla. Moderate to marked severity airspace disease is seen throughout the left lung. A large left-sided pleural effusion is also noted. No pneumothorax is identified. The cardiac silhouette is enlarged and unchanged in size. There is stable left to right deviation of the mediastinal structures. The visualized skeletal structures are unremarkable. IMPRESSION: 1. Moderate to marked severity airspace disease throughout the left lung with large left-sided pleural effusion. 2. No pneumothorax in a patient is status post thoracentesis. Electronically Signed   By: Virgina Norfolk M.D.   On: 11/09/2020 16:35   DG Chest 2 View  Result Date: 11/03/2020 CLINICAL DATA:  Cough EXAM: CHEST - 2 VIEW COMPARISON:  Chest radiograph 10/11/2020 FINDINGS: There is a right sided dialysis catheter in place, unchanged. There is a new large left pleural effusion obscuring the left heart border and layering up the lateral chest wall. A small portion of the left upper lobe is aerated.  Patchy opacities in the right base likely reflect subsegmental atelectasis. The right lung is otherwise clear. There is no significant right effusion. There is no pneumothorax. A left axillary vascular stent is again seen. There is no acute osseous abnormality. IMPRESSION: Large left pleural effusion is new since 10/11/2020. A small portion of the left upper lobe remains aerated. Electronically Signed   By: Valetta Mole M.D.   On: 11/03/2020 15:04   CT CHEST W CONTRAST  Result Date: 11/10/2020 CLINICAL DATA:  Pleural effusion, malignancy suspected in a 70 year old male with history of end-stage renal disease and diabetes with chronic CHF and reported history of metastatic prostate neoplasm. EXAM: CT CHEST WITH CONTRAST TECHNIQUE:  Multidetector CT imaging of the chest was performed during intravenous contrast administration. CONTRAST:  85mL OMNIPAQUE IOHEXOL 300 MG/ML  SOLN COMPARISON:  July 20, 2019. FINDINGS: Cardiovascular: Limited opacification of cardiovascular structures in the setting of heart failure and anasarca. The heart size is moderate to markedly enlarged, similar to the prior study. Central pulmonary vasculature grossly normal on very limited assessment. Aortic caliber and contour appears grossly stable. Scattered atheromatous plaque in the thoracic aorta. Signs of body wall collaterals potentially related to venous stenosis or arm position along the RIGHT chest in this patient with RIGHT sided IJ dialysis catheter which terminates at the upper portion of the RIGHT atrium. Mediastinum/Nodes: Patulous esophagus. Mildly enlarged mediastinal lymph nodes largest 13 mm previously 12 mm (image 49/4) limited assessment of the chest due to extensive anasarca. Post LEFT brachiocephalic, subclavian and axillary stenting partially imaged into the LEFT upper arm where there is a dialysis graft or fistula. Lungs/Pleura: Large LEFT pleural effusion increased from previous imaging associated with volume  loss/airspace disease in the LEFT lung base. Some shift of mediastinal structures due to this large LEFT pleural effusion. RIGHT chest with atelectasis and scarring. Nodule seen on previous imaging are not visualized on the current study. Much of the LEFT lung is collapsed however limiting assessment of the LEFT lung. Tracheal deviation in the setting of large effusion. Airways to the RIGHT lung are patent. Upper Abdomen: Reflux of contrast into hepatic veins. Nodular liver contours with fissural widening. No acute upper abdominal process though there is large volume ascites. Musculoskeletal: Extensive body wall edema blurs fascial planes. Multifocal sclerotic bony lesions are most suggestive of prostate cancer metastases and are more numerous than on the previous study involving bilateral ribs, sternum and numerous levels of the spine. Sternal lesions are new compared to previous imaging. RIGHT posterior sixth rib and seventh rib also new compared to previous imaging. Numerous new scattered foci of bony metastases seen in the LEFT ribs as well. No acute finding related to visualized clavicles or scapulae, enlarging LEFT scapular sclerotic lesion now measures 16 mm as compared to 4-5 mm on the prior exam. There are numerous new lesions throughout the spine as well, for instance posterior elements at T1, new lesion at T4 and new lesions in the T10 and T11 vertebral levels and subjacent T12, enlargement of existing lesions throughout. No acute bone finding. IMPRESSION: Anasarca, findings likely related to volume overload and or heart failure in this patient on dialysis. Extensive stenting of LEFT upper extremity extending into central venous structures not well assessed due to phase of contrast enhancement. Large LEFT pleural effusion, causing mediastinal shift, increased from previous imaging and associated with volume loss/airspace disease in the LEFT lung base LEFT lower. Effusion is partially loculated but without  visible nodularity. Pulmonary nodules are not seen. Dense airspace disease at the LEFT lung base favored to represent rounded atelectasis. Pulmonary nodules not seen potentially related to infectious or inflammatory etiology. Findings that raise the question of liver disease. Worsening of osseous metastatic disease. Electronically Signed   By: Zetta Bills M.D.   On: 11/10/2020 14:23   US Paracentesis  Result Date: 11/12/2020 INDICATION: Cirrhosis. Symptomatic ascites. Request for diagnostic and therapeutic paracentesis EXAM: ULTRASOUND GUIDED PARACENTESIS MEDICATIONS: 1% lidocaine 10 mL COMPLICATIONS: None immediate. PROCEDURE: Informed written consent was obtained from the patient after a discussion of the risks, benefits and alternatives to treatment. A timeout was performed prior to the initiation of the procedure. Initial ultrasound scanning demonstrates a large amount  of ascites within the right lateral abdomen. The right lateral abdomen was prepped and draped in the usual sterile fashion. 1% lidocaine was used for local anesthesia. Following this, a 19 gauge, 7-cm, Yueh catheter was introduced. An ultrasound image was saved for documentation purposes. The paracentesis was performed. The catheter was removed and a dressing was applied. The patient tolerated the procedure well without immediate post procedural complication. FINDINGS: A total of approximately 3.6 L of clear yellow fluid was removed. Samples were sent to the laboratory as requested by the clinical team. IMPRESSION: Successful ultrasound-guided paracentesis yielding 3.6 liters of peritoneal fluid. Read by: Gareth Eagle, PA-C Electronically Signed   By: Sandi Mariscal M.D.   On: 11/12/2020 12:41   IR THORACENTESIS ASP PLEURAL SPACE W/IMG GUIDE  Result Date: 11/09/2020 INDICATION: Patient with history of chronic AFib, ESRD on HD and CHF with left pleural effusion. Request was made for IR to perform diagnostic and therapeutic thoracentesis.  EXAM: ULTRASOUND GUIDED DIAGNOSTIC AND THERAPEUTIC THORACENTESIS MEDICATIONS: 10 ML 1% LIDOCAINE COMPLICATIONS: None immediate. PROCEDURE: An ultrasound guided thoracentesis was thoroughly discussed with the patient and patient's family member and questions answered. The benefits, risks, alternatives and complications were also discussed. The patient understands and wishes to proceed with the procedure. Written consent was obtained. Ultrasound was performed to localize and mark an adequate pocket of fluid in the left chest. The area was then prepped and draped in the normal sterile fashion. 1% Lidocaine was used for local anesthesia. Under ultrasound guidance a 6 Fr Safe-T-Centesis catheter was introduced. Thoracentesis was performed. The catheter was removed and a dressing applied. FINDINGS: A total of approximately 1.3 L of serosanguineous fluid was removed. Samples were sent to the laboratory as requested by the clinical team. IMPRESSION: Successful ultrasound guided left thoracentesis yielding 1.3 L of pleural fluid. Read by: Narda Rutherford, NP Electronically Signed   By: Sandi Mariscal M.D.   On: 11/09/2020 16:39      Subjective: Patient was seen and examined at bedside.  Overnight events noted.   Patient seems lethargic but arousable with physical stimuli. Blood pressure still remains low.  Patient is being discharged to [facility] [place] in [place].  Discharge Exam: Vitals:   11/16/20 2026 11/17/20 0920  BP: (!) 73/57 (!) 80/60  Pulse: 76 90  Resp: 20 18  Temp: 98 F (36.7 C) 98.1 F (36.7 C)  SpO2: 91% 100%   Vitals:   11/16/20 0812 11/16/20 1139 11/16/20 2026 11/17/20 0920  BP: (!) 80/57 (!) 71/51 (!) 73/57 (!) 80/60  Pulse: 89 86 76 90  Resp: 18 20 20 18   Temp: (!) 97.5 F (36.4 C) 97.7 F (36.5 C) 98 F (36.7 C) 98.1 F (36.7 C)  TempSrc: Oral Oral Oral Oral  SpO2: 100% 100% 91% 100%  Weight:        General: Patient is lethargic but arousable with physical  stimuli. Cardiovascular: RRR, S1/S2 +, no rubs, no gallops Respiratory: CTA bilaterally, no wheezing, no rhonchi Abdominal: Soft, NT, ND, bowel sounds + Extremities: Left BKA,    The results of significant diagnostics from this hospitalization (including imaging, microbiology, ancillary and laboratory) are listed below for reference.     Microbiology: Recent Results (from the past 240 hour(s))  Body fluid culture w Gram Stain     Status: None   Collection Time: 11/09/20  4:07 PM   Specimen: Lung, Left; Pleural Fluid  Result Value Ref Range Status   Specimen Description FLUID PLEURAL LEFT  Final  Special Requests NONE  Final   Gram Stain   Final    FEW WBC PRESENT, PREDOMINANTLY MONONUCLEAR NO ORGANISMS SEEN    Culture   Final    NO GROWTH 3 DAYS Performed at [facility], [address], [place], [place] [address]    Report Status 11/12/2020 FINAL  Final  Culture, body fluid w Gram Stain-bottle     Status: None   Collection Time: 11/12/20 11:01 AM   Specimen: Peritoneal Washings  Result Value Ref Range Status   Specimen Description PERITONEAL FLUID  Final   Special Requests NONE  Final   Culture   Final    NO GROWTH 5 DAYS Performed at [facility] [address], [place], [place] [address]    Report Status 11/17/2020 FINAL  Final  Gram stain     Status: None   Collection Time: 11/12/20 11:01 AM   Specimen: Peritoneal Washings  Result Value Ref Range Status   Specimen Description PERITONEAL FLUID  Final   Special Requests NONE  Final   Gram Stain   Final    WBC PRESENT, PREDOMINANTLY MONONUCLEAR NO ORGANISMS SEEN CYTOSPIN SMEAR Performed at [facility], [place] [address] [place], [place] [address]    Report Status 11/12/2020 FINAL  Final     Labs: BNP (last 3 results) No results for input(s): BNP in the last 8760 hours. Basic Metabolic Panel: Recent Labs  Lab 11/12/20 0101 11/13/20 0403 11/14/20 0150 11/15/20 0132 11/16/20 0109  NA  135 133* 132* 132* 133*  K 4.0 3.9 4.2 3.7 4.0  CL 97* 96* 96* 96* 95*  CO2 25 26 22 23 26   GLUCOSE 143* 126* 142* 137* 155*  BUN 28* 36* 46* 34* 46*  CREATININE 3.87* 4.77* 5.34* 4.18* 4.89*  CALCIUM 8.7* 8.5* 8.4* 8.1* 8.5*  PHOS 3.1 3.8 3.9 3.1 3.8   Liver Function Tests: Recent Labs  Lab 11/12/20 0101 11/13/20 0403 11/14/20 0150 11/15/20 0132 11/16/20 0109  ALBUMIN 2.8* 2.6* 2.4* 2.8* 2.7*   No results for input(s): LIPASE, AMYLASE in the last 168 hours. No results for input(s): AMMONIA in the last 168 hours. CBC: Recent Labs  Lab 11/11/20 0430  WBC 6.7  HGB 8.3*  HCT 25.7*  MCV 97.3  PLT 199   Cardiac Enzymes: No results for input(s): CKTOTAL, CKMB, CKMBINDEX, TROPONINI in the last 168 hours. BNP: Invalid input(s): POCBNP CBG: No results for input(s): GLUCAP in the last 168 hours. D-Dimer No results for input(s): DDIMER in the last 72 hours. Hgb A1c No results for input(s): HGBA1C in the last 72 hours. Lipid Profile No results for input(s): CHOL, HDL, LDLCALC, TRIG, CHOLHDL, LDLDIRECT in the last 72 hours. Thyroid function studies No results for input(s): TSH, T4TOTAL, T3FREE, THYROIDAB in the last 72 hours.  Invalid input(s): FREET3 Anemia work up No results for input(s): VITAMINB12, FOLATE, FERRITIN, TIBC, IRON, RETICCTPCT in the last 72 hours. Urinalysis    Component Value Date/Time   COLORURINE AMBER (A) 07/13/2019 1522   APPEARANCEUR CLOUDY (A) 07/13/2019 1522   LABSPEC 1.013 07/13/2019 1522   PHURINE 5.0 07/13/2019 1522   GLUCOSEU NEGATIVE 07/13/2019 1522   HGBUR LARGE (A) 07/13/2019 1522   BILIRUBINUR NEGATIVE 07/13/2019 1522   KETONESUR NEGATIVE 07/13/2019 1522   PROTEINUR 100 (A) 07/13/2019 1522   NITRITE NEGATIVE 07/13/2019 1522   LEUKOCYTESUR LARGE (A) 07/13/2019 1522   Sepsis Labs Invalid input(s): PROCALCITONIN,  WBC,  LACTICIDVEN Microbiology Recent Results (from the past 240  hour(s))  Body fluid culture w Gram Stain     Status:  None   Collection Time: 11/09/20  4:07 PM   Specimen: Lung, Left; Pleural Fluid  Result Value Ref Range Status   Specimen Description FLUID PLEURAL LEFT  Final   Special Requests NONE  Final   Gram Stain   Final    FEW WBC PRESENT, PREDOMINANTLY MONONUCLEAR NO ORGANISMS SEEN    Culture   Final    NO GROWTH 3 DAYS Performed at [facility], [place] [address], [place], [place] [address]    Report Status 11/12/2020 FINAL  Final  Culture, body fluid w Gram Stain-bottle     Status: None   Collection Time: 11/12/20 11:01 AM   Specimen: Peritoneal Washings  Result Value Ref Range Status   Specimen Description PERITONEAL FLUID  Final   Special Requests NONE  Final   Culture   Final    NO GROWTH 5 DAYS Performed at [facility] [address] [place], [place] [address]    Report Status 11/17/2020 FINAL  Final  Gram stain     Status: None   Collection Time: 11/12/20 11:01 AM   Specimen: Peritoneal Washings  Result Value Ref Range Status   Specimen Description PERITONEAL FLUID  Final   Special Requests NONE  Final   Gram Stain   Final    WBC PRESENT, PREDOMINANTLY MONONUCLEAR NO ORGANISMS SEEN CYTOSPIN SMEAR Performed at [facility], [place] [address], [place], [place] [address]    Report Status 11/12/2020 FINAL  Final     Time coordinating discharge: Over 30 minutes  SIGNED:   Shawna Clamp, MD  [facility] 11/17/2020, 11:23 AM Pager   If 7PM-7AM, please contact night-coverage

## 2020-12-19 DEATH — deceased

## 2022-05-11 IMAGING — CT CT ABD-PELV W/O CM
2 of 4 series · 15 of 46 positions shown, 17 images · non-contrast
Comparison: CT abdomen pelvis dated March 21, 2020.

CLINICAL DATA: Chronic abdominal pain.

EXAM:
CT ABDOMEN AND PELVIS WITHOUT CONTRAST
TECHNIQUE: Multidetector CT imaging of the abdomen and pelvis was performed
following the standard protocol without IV contrast.

[Series 3: abd/ pelvis 5.0 i30f 2 · axial · 0.87mm/px · z∈[+767,+1232]mm · 12 of 103 slices shown, 14 images]
[im 5/103  soft-tissue]
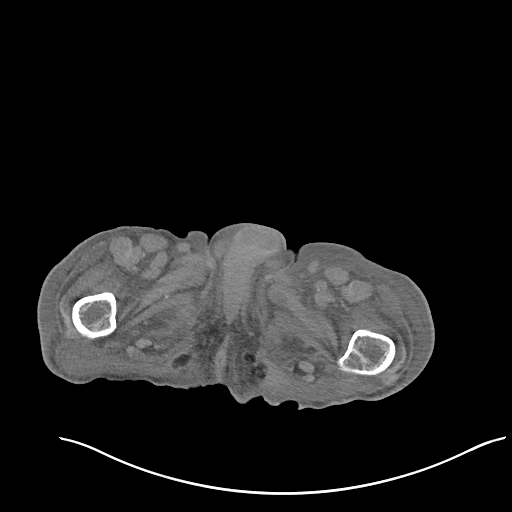
[im 5/103  bone]
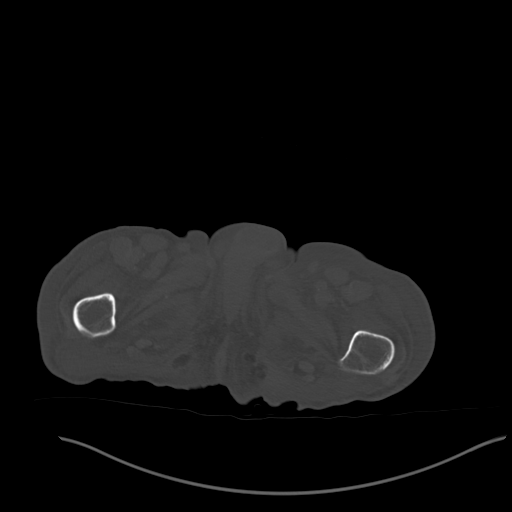
[im 14/103  soft-tissue]
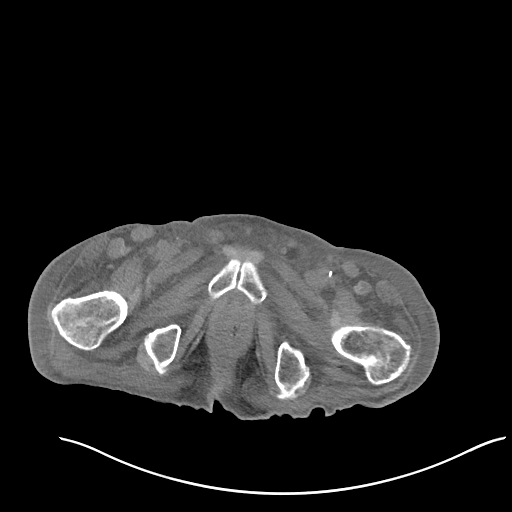
[im 23/103  soft-tissue]
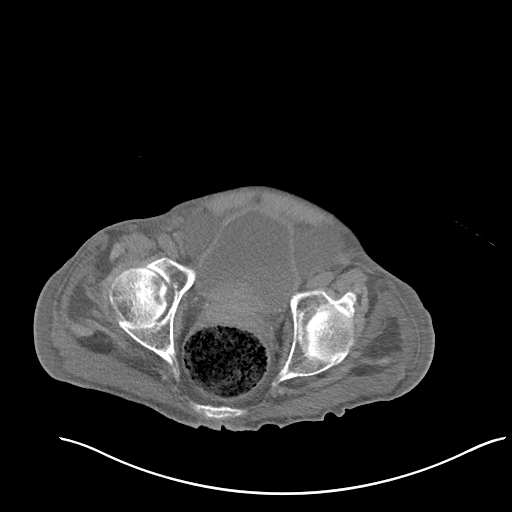
[im 32/103  soft-tissue]
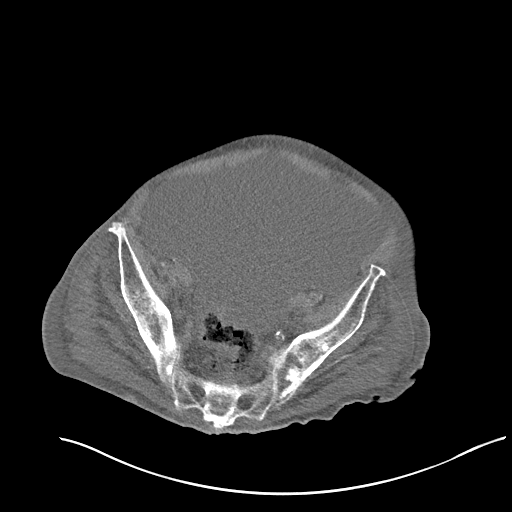
[im 40/103  soft-tissue]
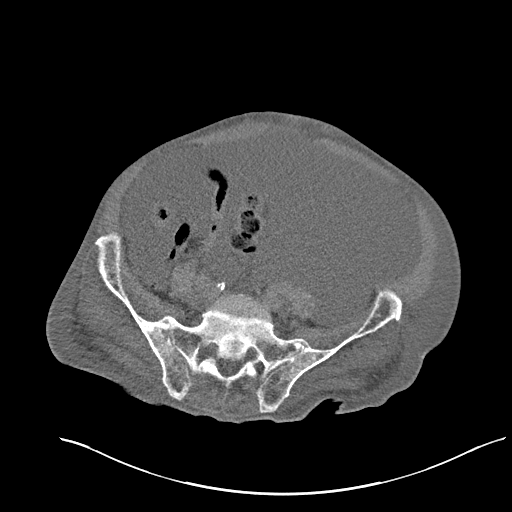
[im 49/103  soft-tissue]
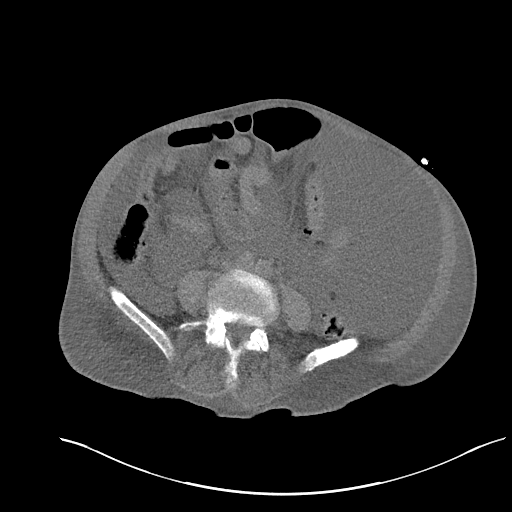
[im 54/103  soft-tissue]
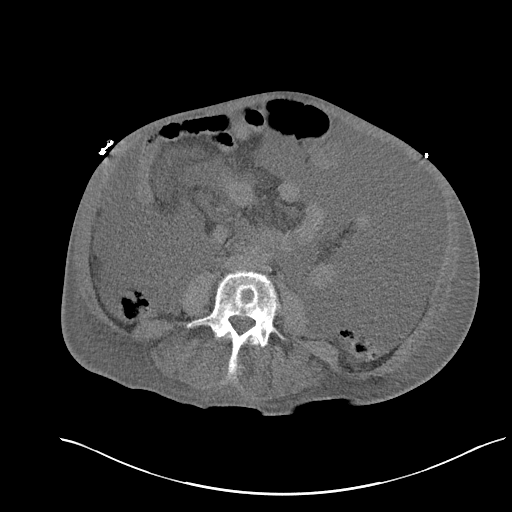
[im 63/103  soft-tissue]
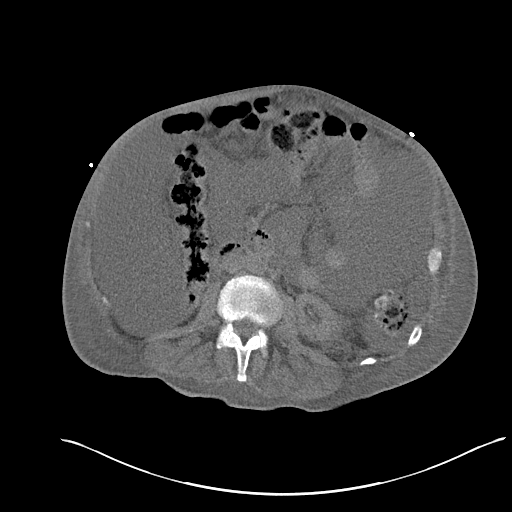
[im 71/103  soft-tissue]
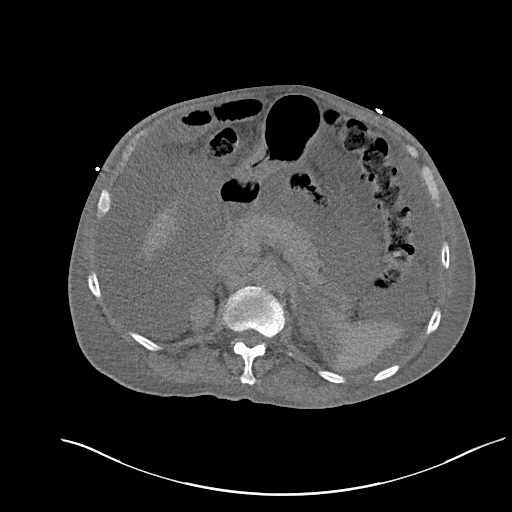
[im 71/103  bone]
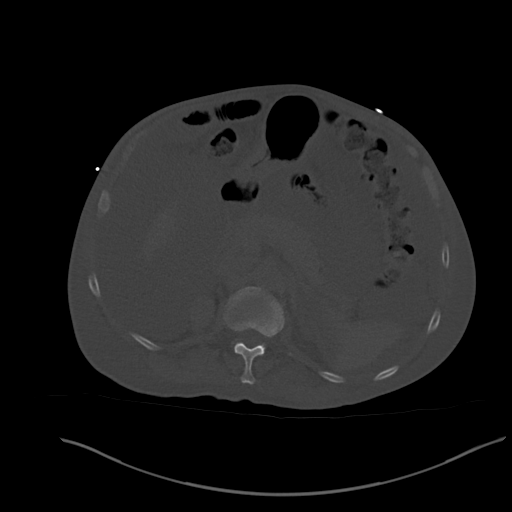
[im 80/103  soft-tissue]
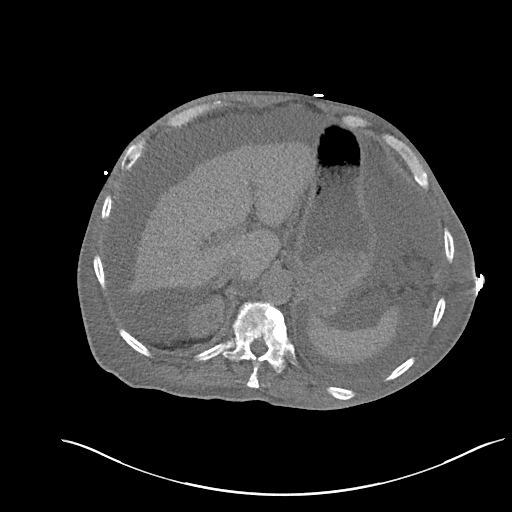
[im 89/103  soft-tissue]
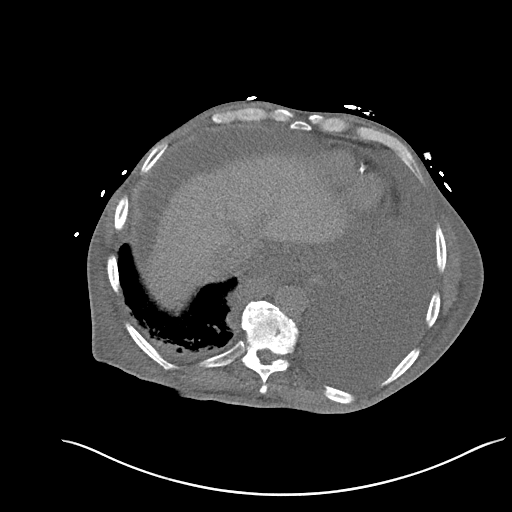
[im 98/103  soft-tissue]
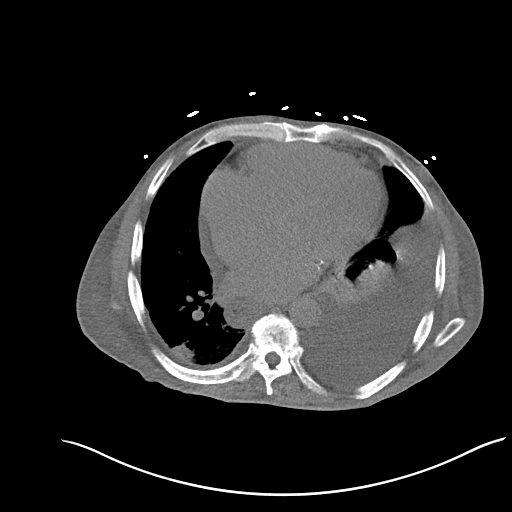

[Series 6: cor st · coronal · 0.75mm/px · 3 of 101 slices shown]
[im 34/101  soft-tissue]
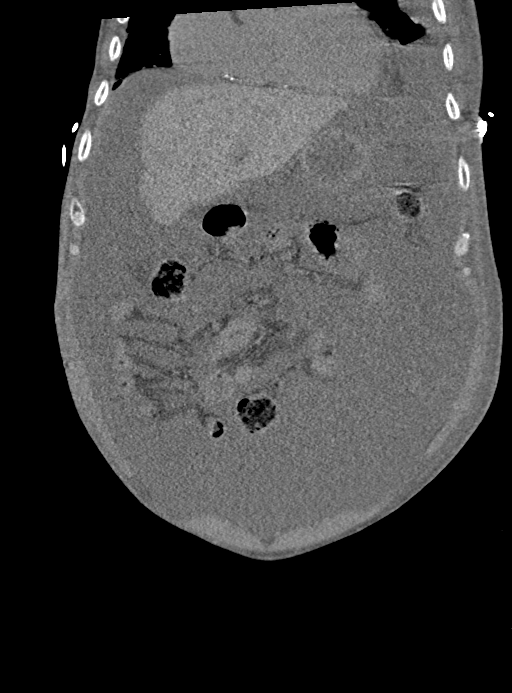
[im 45/101  soft-tissue]
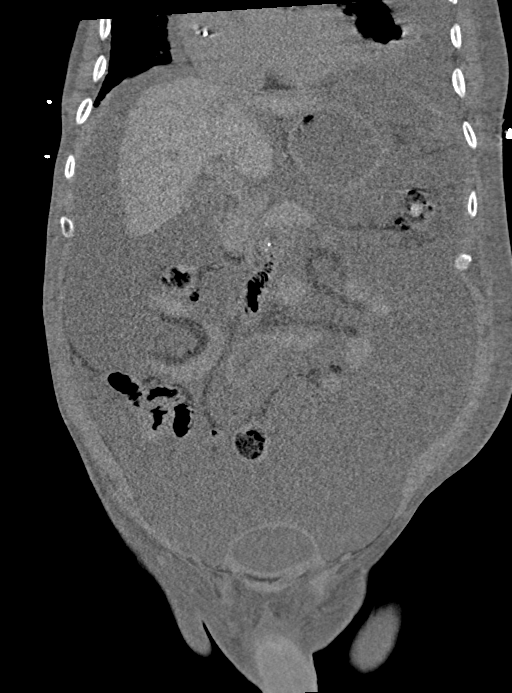
[im 56/101  soft-tissue]
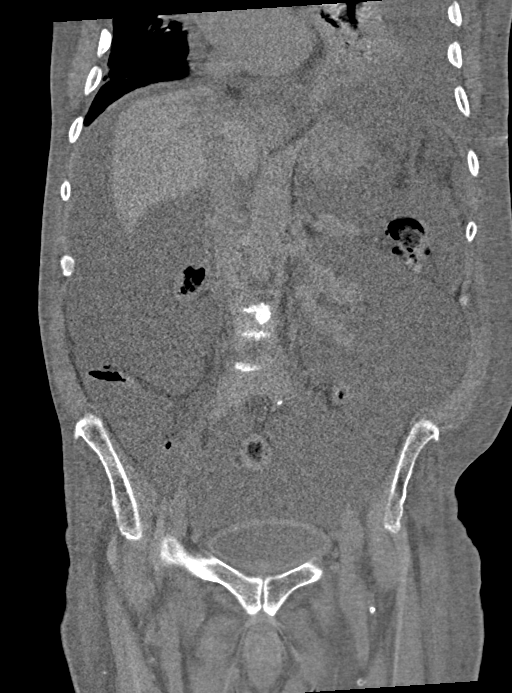

[15 of 46 positions shown; findings below may reference images not displayed]

FINDINGS: Lower chest: Unchanged large left pleural effusion with partial
collapse of the left lower lobe. Unchanged small right pleural
effusion with pleural thickening and round atelectasis in the right
lower lobe. Similar cardiomegaly. Decreased trace pleural effusion.

Hepatobiliary: Unchanged small liver with irregular, nodular
contour. No focal liver abnormality. Suspected tiny gallstones,
similar to prior study. No biliary dilatation.

Pancreas: Unremarkable. No pancreatic ductal dilatation or
surrounding inflammatory changes.

Spleen: Normal in size without focal abnormality.

Adrenals/Urinary Tract: Adrenal glands are unremarkable. Unchanged
bilateral renal atrophy with multiple punctate right renal calculi.
No hydronephrosis. Mild circumferential bladder wall thickening,
improved since the prior study.

Stomach/Bowel: Unchanged small hiatal hernia. Stomach is otherwise
within normal limits. Appendix appears normal. No evidence of bowel
wall thickening, distention, or inflammatory changes. Colonic
diverticulosis.

Vascular/Lymphatic: Aortic atherosclerosis. No enlarged abdominal or
pelvic lymph nodes.

Reproductive: Unchanged mild prostatomegaly.

Other: Large volume ascites, increased since the prior study.
Similar diffuse anasarca. No pneumoperitoneum.

Musculoskeletal: Multiple sclerotic lesions in the spine, pelvis,
and proximal left femur demonstrate overall progression since the
prior study.
IMPRESSION: 1. Unchanged cirrhosis with increased large volume ascites.
2. Progressive osseous metastatic disease.
3. Unchanged large left and small right pleural effusions. Similar
diffuse anasarca.
4. Unchanged punctate nonobstructive right nephrolithiasis.
5. Aortic Atherosclerosis ([id number]).
